# Patient Record
Sex: Male | Born: 1950 | Race: White | Hispanic: No | Smoking: Never smoker
Health system: Southern US, Community
[De-identification: ages and names within clinical notes are randomized; demographics above are authoritative.]

## PROBLEM LIST (undated history)

## (undated) DIAGNOSIS — R911 Solitary pulmonary nodule: Secondary | ICD-10-CM

## (undated) DIAGNOSIS — T1491XA Suicide attempt, initial encounter: Secondary | ICD-10-CM

## (undated) DIAGNOSIS — K219 Gastro-esophageal reflux disease without esophagitis: Secondary | ICD-10-CM

## (undated) DIAGNOSIS — K760 Fatty (change of) liver, not elsewhere classified: Secondary | ICD-10-CM

## (undated) DIAGNOSIS — I10 Essential (primary) hypertension: Secondary | ICD-10-CM

## (undated) DIAGNOSIS — F329 Major depressive disorder, single episode, unspecified: Secondary | ICD-10-CM

## (undated) DIAGNOSIS — N529 Male erectile dysfunction, unspecified: Secondary | ICD-10-CM

## (undated) DIAGNOSIS — J45909 Unspecified asthma, uncomplicated: Secondary | ICD-10-CM

## (undated) DIAGNOSIS — I499 Cardiac arrhythmia, unspecified: Secondary | ICD-10-CM

## (undated) DIAGNOSIS — G2581 Restless legs syndrome: Secondary | ICD-10-CM

## (undated) DIAGNOSIS — F32A Depression, unspecified: Secondary | ICD-10-CM

## (undated) DIAGNOSIS — F603 Borderline personality disorder: Secondary | ICD-10-CM

## (undated) DIAGNOSIS — E039 Hypothyroidism, unspecified: Secondary | ICD-10-CM

## (undated) DIAGNOSIS — G4730 Sleep apnea, unspecified: Secondary | ICD-10-CM

## (undated) DIAGNOSIS — F419 Anxiety disorder, unspecified: Secondary | ICD-10-CM

## (undated) HISTORY — PX: AMPUTATION ARM: SHX6593

## (undated) HISTORY — PX: COLONOSCOPY: SHX174

---

## 2004-06-23 ENCOUNTER — Emergency Department: Payer: Self-pay | Admitting: General Practice

## 2005-03-13 ENCOUNTER — Emergency Department: Payer: Self-pay | Admitting: Emergency Medicine

## 2005-03-13 ENCOUNTER — Other Ambulatory Visit: Payer: Self-pay

## 2005-06-04 ENCOUNTER — Inpatient Hospital Stay: Payer: Self-pay | Admitting: Psychiatry

## 2005-11-19 ENCOUNTER — Emergency Department: Payer: Self-pay | Admitting: Emergency Medicine

## 2006-02-22 ENCOUNTER — Ambulatory Visit: Payer: Self-pay | Admitting: Orthopedic Surgery

## 2006-06-17 ENCOUNTER — Other Ambulatory Visit: Payer: Self-pay

## 2006-06-17 ENCOUNTER — Ambulatory Visit: Payer: Self-pay | Admitting: Orthopedic Surgery

## 2006-11-22 ENCOUNTER — Emergency Department: Payer: Self-pay | Admitting: Emergency Medicine

## 2008-07-12 ENCOUNTER — Ambulatory Visit: Payer: Self-pay | Admitting: Gastroenterology

## 2008-11-26 ENCOUNTER — Emergency Department: Payer: Self-pay | Admitting: Internal Medicine

## 2009-02-19 ENCOUNTER — Emergency Department: Payer: Self-pay | Admitting: Emergency Medicine

## 2010-03-01 ENCOUNTER — Emergency Department: Payer: Self-pay | Admitting: Emergency Medicine

## 2010-04-11 ENCOUNTER — Inpatient Hospital Stay: Payer: Self-pay | Admitting: Psychiatry

## 2011-10-13 ENCOUNTER — Emergency Department: Payer: Self-pay | Admitting: Emergency Medicine

## 2011-10-22 ENCOUNTER — Inpatient Hospital Stay: Payer: Self-pay | Admitting: Psychiatry

## 2011-10-22 LAB — ETHANOL
Ethanol %: <0.003 % (ref 0.000–0.080)
Ethanol: <3 mg/dL

## 2011-10-22 LAB — DRUG SCREEN, URINE
Amphetamines, Ur Screen: NEGATIVE (ref ?–1000)
Benzodiazepine, Ur Scrn: NEGATIVE (ref ?–200)
Cannabinoid 50 Ng, Ur ~~LOC~~: NEGATIVE (ref ?–50)
Cocaine Metabolite,Ur ~~LOC~~: NEGATIVE (ref ?–300)
MDMA (Ecstasy)Ur Screen: POSITIVE (ref ?–500)
Methadone, Ur Screen: NEGATIVE (ref ?–300)
Opiate, Ur Screen: NEGATIVE (ref ?–300)
Tricyclic, Ur Screen: NEGATIVE (ref ?–1000)

## 2011-10-22 LAB — COMPREHENSIVE METABOLIC PANEL
Albumin: 3.8 g/dL (ref 3.4–5.0)
Anion Gap: 11 (ref 7–16)
Bilirubin,Total: 0.7 mg/dL (ref 0.2–1.0)
Chloride: 104 mmol/L (ref 98–107)
Co2: 26 mmol/L (ref 21–32)
Creatinine: 0.73 mg/dL (ref 0.60–1.30)
EGFR (African American): >60
EGFR (Non-African Amer.): >60
Osmolality: 280 (ref 275–301)
Potassium: 3.9 mmol/L (ref 3.5–5.1)
Sodium: 141 mmol/L (ref 136–145)

## 2011-10-22 LAB — URINALYSIS, COMPLETE
Bilirubin,UR: NEGATIVE
Blood: NEGATIVE
Ketone: NEGATIVE
Nitrite: NEGATIVE
Ph: 6 (ref 4.5–8.0)
Protein: NEGATIVE
Squamous Epithelial: 1
WBC UR: <1 /HPF (ref 0–5)

## 2011-10-22 LAB — CBC
HCT: 48.5 % (ref 40.0–52.0)
MCHC: 34.3 g/dL (ref 32.0–36.0)
MCV: 92 fL (ref 80–100)
RBC: 5.24 10*6/uL (ref 4.40–5.90)
RDW: 13.2 % (ref 11.5–14.5)
WBC: 4.5 10*3/uL (ref 3.8–10.6)

## 2011-10-22 LAB — TSH: Thyroid Stimulating Horm: 1.56 u[IU]/mL

## 2011-10-22 LAB — ACETAMINOPHEN LEVEL: Acetaminophen: <2 ug/mL

## 2012-02-12 ENCOUNTER — Emergency Department: Payer: Self-pay | Admitting: Internal Medicine

## 2012-02-12 LAB — CBC
HCT: 49.1 % (ref 40.0–52.0)
HGB: 16.5 g/dL (ref 13.0–18.0)
MCH: 31.3 pg (ref 26.0–34.0)
MCHC: 33.7 g/dL (ref 32.0–36.0)
MCV: 93 fL (ref 80–100)
RDW: 13.4 % (ref 11.5–14.5)

## 2012-02-12 LAB — URINALYSIS, COMPLETE
Bacteria: NONE SEEN
Bilirubin,UR: NEGATIVE
Blood: NEGATIVE
Ketone: NEGATIVE
Leukocyte Esterase: NEGATIVE
Ph: 5 (ref 4.5–8.0)
Protein: NEGATIVE
Specific Gravity: 1.019 (ref 1.003–1.030)
Squamous Epithelial: NONE SEEN
WBC UR: NONE SEEN /HPF (ref 0–5)

## 2012-02-12 LAB — COMPREHENSIVE METABOLIC PANEL
Albumin: 3.8 g/dL (ref 3.4–5.0)
BUN: 12 mg/dL (ref 7–18)
Bilirubin,Total: 0.9 mg/dL (ref 0.2–1.0)
Calcium, Total: 9.4 mg/dL (ref 8.5–10.1)
Chloride: 111 mmol/L — ABNORMAL HIGH (ref 98–107)
Co2: 25 mmol/L (ref 21–32)
Creatinine: 1.08 mg/dL (ref 0.60–1.30)
EGFR (African American): >60
EGFR (Non-African Amer.): >60
Glucose: 74 mg/dL (ref 65–99)
Potassium: 4.3 mmol/L (ref 3.5–5.1)
SGPT (ALT): 34 U/L
Total Protein: 7.9 g/dL (ref 6.4–8.2)

## 2012-02-12 LAB — CK TOTAL AND CKMB (NOT AT ARMC): CK, Total: 167 U/L (ref 35–232)

## 2012-02-12 LAB — DRUG SCREEN, URINE
Barbiturates, Ur Screen: NEGATIVE (ref ?–200)
Benzodiazepine, Ur Scrn: NEGATIVE (ref ?–200)
Cannabinoid 50 Ng, Ur ~~LOC~~: NEGATIVE (ref ?–50)
MDMA (Ecstasy)Ur Screen: POSITIVE (ref ?–500)
Opiate, Ur Screen: NEGATIVE (ref ?–300)
Phencyclidine (PCP) Ur S: NEGATIVE (ref ?–25)
Tricyclic, Ur Screen: NEGATIVE (ref ?–1000)

## 2012-02-12 LAB — PROTIME-INR: INR: 1

## 2012-02-12 LAB — TROPONIN I: Troponin-I: <0.02 ng/mL

## 2012-06-19 LAB — COMPREHENSIVE METABOLIC PANEL
BUN: 7 mg/dL (ref 7–18)
Calcium, Total: 8.6 mg/dL (ref 8.5–10.1)
Chloride: 103 mmol/L (ref 98–107)
Co2: 18 mmol/L — ABNORMAL LOW (ref 21–32)
EGFR (African American): >60
EGFR (Non-African Amer.): >60
SGOT(AST): 49 U/L — ABNORMAL HIGH (ref 15–37)
SGPT (ALT): 43 U/L (ref 12–78)

## 2012-06-19 LAB — TSH: Thyroid Stimulating Horm: 3.39 u[IU]/mL

## 2012-06-19 LAB — CBC
HCT: 53.4 % — ABNORMAL HIGH (ref 40.0–52.0)
HGB: 18.3 g/dL — ABNORMAL HIGH (ref 13.0–18.0)
MCH: 32 pg (ref 26.0–34.0)
MCHC: 34.3 g/dL (ref 32.0–36.0)
MCV: 93 fL (ref 80–100)
RBC: 5.74 10*6/uL (ref 4.40–5.90)

## 2012-06-19 LAB — SALICYLATE LEVEL: Salicylates, Serum: <1.7 mg/dL

## 2012-06-19 LAB — DRUG SCREEN, URINE
Barbiturates, Ur Screen: NEGATIVE (ref ?–200)
Benzodiazepine, Ur Scrn: NEGATIVE (ref ?–200)
Cocaine Metabolite,Ur ~~LOC~~: NEGATIVE (ref ?–300)
MDMA (Ecstasy)Ur Screen: NEGATIVE (ref ?–500)
Tricyclic, Ur Screen: NEGATIVE (ref ?–1000)

## 2012-06-19 LAB — ETHANOL: Ethanol: 118 mg/dL

## 2012-06-20 ENCOUNTER — Inpatient Hospital Stay: Payer: Self-pay | Admitting: Psychiatry

## 2012-07-11 ENCOUNTER — Inpatient Hospital Stay: Payer: Self-pay | Admitting: Psychiatry

## 2012-07-11 LAB — DRUG SCREEN, URINE
Amphetamines, Ur Screen: NEGATIVE (ref ?–1000)
Barbiturates, Ur Screen: NEGATIVE (ref ?–200)
Benzodiazepine, Ur Scrn: NEGATIVE (ref ?–200)
Cannabinoid 50 Ng, Ur ~~LOC~~: NEGATIVE (ref ?–50)
Cocaine Metabolite,Ur ~~LOC~~: NEGATIVE (ref ?–300)
MDMA (Ecstasy)Ur Screen: NEGATIVE (ref ?–500)
Methadone, Ur Screen: NEGATIVE (ref ?–300)
Tricyclic, Ur Screen: NEGATIVE (ref ?–1000)

## 2012-07-11 LAB — COMPREHENSIVE METABOLIC PANEL
Albumin: 4.1 g/dL (ref 3.4–5.0)
Alkaline Phosphatase: 55 U/L (ref 50–136)
Calcium, Total: 9.2 mg/dL (ref 8.5–10.1)
Chloride: 103 mmol/L (ref 98–107)
Co2: 27 mmol/L (ref 21–32)
EGFR (African American): >60
Potassium: 4.7 mmol/L (ref 3.5–5.1)
SGOT(AST): 39 U/L — ABNORMAL HIGH (ref 15–37)
SGPT (ALT): 46 U/L (ref 12–78)
Sodium: 140 mmol/L (ref 136–145)
Total Protein: 8.2 g/dL (ref 6.4–8.2)

## 2012-07-11 LAB — URINALYSIS, COMPLETE
Bacteria: NONE SEEN
Glucose,UR: NEGATIVE mg/dL (ref 0–75)
Leukocyte Esterase: NEGATIVE
Nitrite: NEGATIVE
Ph: 6 (ref 4.5–8.0)
RBC,UR: NONE SEEN /HPF (ref 0–5)
Squamous Epithelial: <1
WBC UR: NONE SEEN /HPF (ref 0–5)

## 2012-07-11 LAB — CBC
HGB: 18.4 g/dL — ABNORMAL HIGH (ref 13.0–18.0)
MCHC: 35.2 g/dL (ref 32.0–36.0)
RBC: 5.58 10*6/uL (ref 4.40–5.90)
RDW: 13.8 % (ref 11.5–14.5)

## 2012-07-11 LAB — ETHANOL: Ethanol: 152 mg/dL

## 2012-07-11 LAB — ACETAMINOPHEN LEVEL: Acetaminophen: <2 ug/mL

## 2012-07-11 LAB — SALICYLATE LEVEL: Salicylates, Serum: <1.7 mg/dL

## 2012-09-07 ENCOUNTER — Inpatient Hospital Stay: Payer: Self-pay | Admitting: Psychiatry

## 2012-09-07 LAB — COMPREHENSIVE METABOLIC PANEL
Alkaline Phosphatase: 67 U/L (ref 50–136)
Anion Gap: 9 (ref 7–16)
BUN: 8 mg/dL (ref 7–18)
Bilirubin,Total: 0.8 mg/dL (ref 0.2–1.0)
Chloride: 105 mmol/L (ref 98–107)
Co2: 24 mmol/L (ref 21–32)
Creatinine: 0.67 mg/dL (ref 0.60–1.30)
EGFR (African American): >60
EGFR (Non-African Amer.): >60
Glucose: 63 mg/dL — ABNORMAL LOW (ref 65–99)
Osmolality: 272 (ref 275–301)
Sodium: 138 mmol/L (ref 136–145)
Total Protein: 8.2 g/dL (ref 6.4–8.2)

## 2012-09-07 LAB — DRUG SCREEN, URINE
Amphetamines, Ur Screen: NEGATIVE (ref ?–1000)
Barbiturates, Ur Screen: NEGATIVE (ref ?–200)
Benzodiazepine, Ur Scrn: NEGATIVE (ref ?–200)
Methadone, Ur Screen: NEGATIVE (ref ?–300)
Phencyclidine (PCP) Ur S: NEGATIVE (ref ?–25)

## 2012-09-07 LAB — URINALYSIS, COMPLETE
Blood: NEGATIVE
Glucose,UR: 50 mg/dL (ref 0–75)
Leukocyte Esterase: NEGATIVE
Nitrite: NEGATIVE
Ph: 6 (ref 4.5–8.0)
Protein: NEGATIVE
RBC,UR: NONE SEEN /HPF (ref 0–5)
Squamous Epithelial: NONE SEEN

## 2012-09-07 LAB — CBC
HCT: 52.9 % — ABNORMAL HIGH (ref 40.0–52.0)
HGB: 18.2 g/dL — ABNORMAL HIGH (ref 13.0–18.0)
MCH: 31.8 pg (ref 26.0–34.0)
Platelet: 226 10*3/uL (ref 150–440)
RBC: 5.72 10*6/uL (ref 4.40–5.90)
WBC: 4.9 10*3/uL (ref 3.8–10.6)

## 2012-09-07 LAB — ACETAMINOPHEN LEVEL: Acetaminophen: <2 ug/mL

## 2012-09-07 LAB — ETHANOL: Ethanol: 144 mg/dL

## 2012-09-08 LAB — BEHAVIORAL MEDICINE 1 PANEL
Albumin: 3.9 g/dL (ref 3.4–5.0)
Alkaline Phosphatase: 63 U/L (ref 50–136)
Anion Gap: 10 (ref 7–16)
BUN: 12 mg/dL (ref 7–18)
Basophil #: 0.1 10*3/uL (ref 0.0–0.1)
Basophil %: 1 %
Bilirubin,Total: 1.8 mg/dL — ABNORMAL HIGH (ref 0.2–1.0)
Calcium, Total: 8.9 mg/dL (ref 8.5–10.1)
Chloride: 102 mmol/L (ref 98–107)
Co2: 23 mmol/L (ref 21–32)
Creatinine: 0.66 mg/dL (ref 0.60–1.30)
Eosinophil #: 0.1 10*3/uL (ref 0.0–0.7)
Glucose: 81 mg/dL (ref 65–99)
HCT: 52.8 % — ABNORMAL HIGH (ref 40.0–52.0)
Lymphocyte #: 1.7 10*3/uL (ref 1.0–3.6)
Lymphocyte %: 24.4 %
MCHC: 33.7 g/dL (ref 32.0–36.0)
MCV: 92 fL (ref 80–100)
Neutrophil #: 4.4 10*3/uL (ref 1.4–6.5)
RDW: 13.4 % (ref 11.5–14.5)
SGOT(AST): 29 U/L (ref 15–37)
SGPT (ALT): 47 U/L (ref 12–78)
Sodium: 135 mmol/L — ABNORMAL LOW (ref 136–145)
Thyroid Stimulating Horm: 4.57 u[IU]/mL — ABNORMAL HIGH
Total Protein: 7.6 g/dL (ref 6.4–8.2)

## 2012-09-29 ENCOUNTER — Emergency Department: Payer: Self-pay | Admitting: Emergency Medicine

## 2012-09-29 LAB — URINALYSIS, COMPLETE
Bacteria: NONE SEEN
Glucose,UR: NEGATIVE mg/dL (ref 0–75)
Ketone: NEGATIVE
Leukocyte Esterase: NEGATIVE
Nitrite: NEGATIVE
Protein: NEGATIVE
RBC,UR: NONE SEEN /HPF (ref 0–5)
Squamous Epithelial: <1
WBC UR: NONE SEEN /HPF (ref 0–5)

## 2012-09-29 LAB — COMPREHENSIVE METABOLIC PANEL
Alkaline Phosphatase: 69 U/L (ref 50–136)
BUN: 9 mg/dL (ref 7–18)
Bilirubin,Total: 0.7 mg/dL (ref 0.2–1.0)
Calcium, Total: 9.1 mg/dL (ref 8.5–10.1)
Co2: 25 mmol/L (ref 21–32)
Creatinine: 0.58 mg/dL — ABNORMAL LOW (ref 0.60–1.30)
EGFR (African American): >60
EGFR (Non-African Amer.): >60
Glucose: 61 mg/dL — ABNORMAL LOW (ref 65–99)
Osmolality: 274 (ref 275–301)
Sodium: 139 mmol/L (ref 136–145)

## 2012-09-29 LAB — CBC
HCT: 54.1 % — ABNORMAL HIGH (ref 40.0–52.0)
MCH: 31.9 pg (ref 26.0–34.0)
RBC: 5.86 10*6/uL (ref 4.40–5.90)

## 2012-09-29 LAB — DRUG SCREEN, URINE
Amphetamines, Ur Screen: NEGATIVE (ref ?–1000)
Barbiturates, Ur Screen: NEGATIVE (ref ?–200)
Benzodiazepine, Ur Scrn: NEGATIVE (ref ?–200)
Cannabinoid 50 Ng, Ur ~~LOC~~: NEGATIVE (ref ?–50)
Cocaine Metabolite,Ur ~~LOC~~: NEGATIVE (ref ?–300)
Opiate, Ur Screen: NEGATIVE (ref ?–300)
Phencyclidine (PCP) Ur S: NEGATIVE (ref ?–25)
Tricyclic, Ur Screen: NEGATIVE (ref ?–1000)

## 2012-09-29 LAB — SALICYLATE LEVEL: Salicylates, Serum: <1.7 mg/dL

## 2012-09-29 LAB — ETHANOL
Ethanol %: 0.174 % — ABNORMAL HIGH (ref 0.000–0.080)
Ethanol: 174 mg/dL

## 2012-09-29 LAB — ACETAMINOPHEN LEVEL: Acetaminophen: <2 ug/mL

## 2012-10-07 ENCOUNTER — Ambulatory Visit: Payer: Self-pay | Admitting: Family Medicine

## 2012-10-30 LAB — ETHANOL
Ethanol %: <0.003 % (ref 0.000–0.080)
Ethanol: <3 mg/dL

## 2012-10-30 LAB — COMPREHENSIVE METABOLIC PANEL
Albumin: 4 g/dL (ref 3.4–5.0)
Anion Gap: 8 (ref 7–16)
Bilirubin,Total: 0.8 mg/dL (ref 0.2–1.0)
Co2: 25 mmol/L (ref 21–32)
Creatinine: 0.83 mg/dL (ref 0.60–1.30)
EGFR (African American): >60
EGFR (Non-African Amer.): >60
Glucose: 60 mg/dL — ABNORMAL LOW (ref 65–99)
Osmolality: 272 (ref 275–301)
Potassium: 4.3 mmol/L (ref 3.5–5.1)
SGPT (ALT): 45 U/L (ref 12–78)

## 2012-10-30 LAB — URINALYSIS, COMPLETE
Bacteria: NONE SEEN
Bilirubin,UR: NEGATIVE
Blood: NEGATIVE
Ketone: NEGATIVE
Nitrite: NEGATIVE
Protein: NEGATIVE
RBC,UR: NONE SEEN /HPF (ref 0–5)
WBC UR: <1 /HPF (ref 0–5)

## 2012-10-30 LAB — CBC
HCT: 50.9 % (ref 40.0–52.0)
MCH: 31.4 pg (ref 26.0–34.0)
MCV: 92 fL (ref 80–100)
Platelet: 248 10*3/uL (ref 150–440)
RBC: 5.56 10*6/uL (ref 4.40–5.90)
RDW: 14 % (ref 11.5–14.5)
WBC: 5.3 10*3/uL (ref 3.8–10.6)

## 2012-10-30 LAB — DRUG SCREEN, URINE
Amphetamines, Ur Screen: NEGATIVE (ref ?–1000)
Barbiturates, Ur Screen: NEGATIVE (ref ?–200)
Cocaine Metabolite,Ur ~~LOC~~: NEGATIVE (ref ?–300)
MDMA (Ecstasy)Ur Screen: NEGATIVE (ref ?–500)
Methadone, Ur Screen: NEGATIVE (ref ?–300)
Opiate, Ur Screen: NEGATIVE (ref ?–300)
Tricyclic, Ur Screen: NEGATIVE (ref ?–1000)

## 2012-10-30 LAB — TSH: Thyroid Stimulating Horm: 3.41 u[IU]/mL

## 2012-10-31 ENCOUNTER — Inpatient Hospital Stay: Payer: Self-pay | Admitting: Psychiatry

## 2012-12-25 LAB — COMPREHENSIVE METABOLIC PANEL
Albumin: 3.5 g/dL (ref 3.4–5.0)
Alkaline Phosphatase: 48 U/L — ABNORMAL LOW (ref 50–136)
Anion Gap: 9 (ref 7–16)
Chloride: 112 mmol/L — ABNORMAL HIGH (ref 98–107)
Creatinine: 0.77 mg/dL (ref 0.60–1.30)
EGFR (African American): >60
EGFR (Non-African Amer.): >60
Osmolality: 275 (ref 275–301)
SGOT(AST): 31 U/L (ref 15–37)
Total Protein: 7 g/dL (ref 6.4–8.2)

## 2012-12-25 LAB — DRUG SCREEN, URINE
Amphetamines, Ur Screen: NEGATIVE (ref ?–1000)
Barbiturates, Ur Screen: NEGATIVE (ref ?–200)
Cocaine Metabolite,Ur ~~LOC~~: NEGATIVE (ref ?–300)
MDMA (Ecstasy)Ur Screen: NEGATIVE (ref ?–500)
Methadone, Ur Screen: NEGATIVE (ref ?–300)
Phencyclidine (PCP) Ur S: NEGATIVE (ref ?–25)
Tricyclic, Ur Screen: NEGATIVE (ref ?–1000)

## 2012-12-25 LAB — TSH: Thyroid Stimulating Horm: 2.75 u[IU]/mL

## 2012-12-25 LAB — URINALYSIS, COMPLETE
Blood: NEGATIVE
Ketone: NEGATIVE
Nitrite: NEGATIVE
Protein: NEGATIVE
Squamous Epithelial: <1
WBC UR: NONE SEEN /HPF (ref 0–5)

## 2012-12-25 LAB — ETHANOL: Ethanol: 135 mg/dL

## 2012-12-25 LAB — CBC
MCHC: 34.7 g/dL (ref 32.0–36.0)
RBC: 4.94 10*6/uL (ref 4.40–5.90)
RDW: 14 % (ref 11.5–14.5)

## 2012-12-26 ENCOUNTER — Inpatient Hospital Stay: Payer: Self-pay | Admitting: Psychiatry

## 2012-12-30 LAB — COMPREHENSIVE METABOLIC PANEL
Alkaline Phosphatase: 56 U/L (ref 50–136)
Anion Gap: 5 — ABNORMAL LOW (ref 7–16)
BUN: 11 mg/dL (ref 7–18)
Bilirubin,Total: 0.9 mg/dL (ref 0.2–1.0)
Calcium, Total: 9.1 mg/dL (ref 8.5–10.1)
Chloride: 102 mmol/L (ref 98–107)
Co2: 26 mmol/L (ref 21–32)
EGFR (Non-African Amer.): >60
Glucose: 71 mg/dL (ref 65–99)
Potassium: 4.2 mmol/L (ref 3.5–5.1)
SGPT (ALT): 62 U/L (ref 12–78)
Sodium: 133 mmol/L — ABNORMAL LOW (ref 136–145)
Total Protein: 7.9 g/dL (ref 6.4–8.2)

## 2012-12-30 LAB — DRUG SCREEN, URINE
Amphetamines, Ur Screen: NEGATIVE (ref ?–1000)
Barbiturates, Ur Screen: NEGATIVE (ref ?–200)
Cannabinoid 50 Ng, Ur ~~LOC~~: NEGATIVE (ref ?–50)
Cocaine Metabolite,Ur ~~LOC~~: NEGATIVE (ref ?–300)
MDMA (Ecstasy)Ur Screen: NEGATIVE (ref ?–500)
Phencyclidine (PCP) Ur S: NEGATIVE (ref ?–25)
Tricyclic, Ur Screen: NEGATIVE (ref ?–1000)

## 2012-12-30 LAB — CBC
HCT: 50.5 % (ref 40.0–52.0)
HGB: 17.7 g/dL (ref 13.0–18.0)
MCH: 32.5 pg (ref 26.0–34.0)
MCHC: 35.1 g/dL (ref 32.0–36.0)
MCV: 93 fL (ref 80–100)
Platelet: 238 10*3/uL (ref 150–440)
RBC: 5.45 10*6/uL (ref 4.40–5.90)
WBC: 4.8 10*3/uL (ref 3.8–10.6)

## 2012-12-30 LAB — ACETAMINOPHEN LEVEL: Acetaminophen: <2 ug/mL

## 2012-12-30 LAB — TSH: Thyroid Stimulating Horm: 2.58 u[IU]/mL

## 2012-12-30 LAB — ETHANOL: Ethanol %: <0.003 % (ref 0.000–0.080)

## 2012-12-30 LAB — SALICYLATE LEVEL: Salicylates, Serum: <1.7 mg/dL

## 2012-12-31 ENCOUNTER — Inpatient Hospital Stay: Payer: Self-pay | Admitting: Psychiatry

## 2013-01-10 ENCOUNTER — Emergency Department: Payer: Self-pay | Admitting: Emergency Medicine

## 2013-01-10 LAB — URINALYSIS, COMPLETE
Glucose,UR: NEGATIVE mg/dL (ref 0–75)
Leukocyte Esterase: NEGATIVE
Ph: 6 (ref 4.5–8.0)
RBC,UR: 3 /HPF (ref 0–5)
Specific Gravity: 1.01 (ref 1.003–1.030)
WBC UR: 3 /HPF (ref 0–5)

## 2013-01-10 LAB — CBC
HCT: 50.4 % (ref 40.0–52.0)
HGB: 17.8 g/dL (ref 13.0–18.0)
MCH: 32.1 pg (ref 26.0–34.0)
Platelet: 243 10*3/uL (ref 150–440)
RBC: 5.53 10*6/uL (ref 4.40–5.90)
RDW: 13.5 % (ref 11.5–14.5)
WBC: 5.4 10*3/uL (ref 3.8–10.6)

## 2013-01-10 LAB — COMPREHENSIVE METABOLIC PANEL
Anion Gap: 5 — ABNORMAL LOW (ref 7–16)
Co2: 28 mmol/L (ref 21–32)
Creatinine: 0.72 mg/dL (ref 0.60–1.30)
EGFR (African American): >60
Glucose: 67 mg/dL (ref 65–99)
Potassium: 4.2 mmol/L (ref 3.5–5.1)
SGOT(AST): 36 U/L (ref 15–37)
SGPT (ALT): 70 U/L (ref 12–78)
Sodium: 132 mmol/L — ABNORMAL LOW (ref 136–145)
Total Protein: 8.1 g/dL (ref 6.4–8.2)

## 2013-01-10 LAB — TSH: Thyroid Stimulating Horm: 4.11 u[IU]/mL

## 2013-01-10 LAB — ETHANOL
Ethanol %: 0.122 % — ABNORMAL HIGH (ref 0.000–0.080)
Ethanol: 122 mg/dL

## 2013-01-10 LAB — DRUG SCREEN, URINE
Barbiturates, Ur Screen: NEGATIVE (ref ?–200)
Benzodiazepine, Ur Scrn: NEGATIVE (ref ?–200)
Cocaine Metabolite,Ur ~~LOC~~: NEGATIVE (ref ?–300)
Tricyclic, Ur Screen: NEGATIVE (ref ?–1000)

## 2013-02-03 ENCOUNTER — Emergency Department: Payer: Self-pay | Admitting: Emergency Medicine

## 2013-02-25 ENCOUNTER — Emergency Department: Payer: Self-pay | Admitting: Emergency Medicine

## 2013-02-25 LAB — COMPREHENSIVE METABOLIC PANEL
Albumin: 4.1 g/dL (ref 3.4–5.0)
Alkaline Phosphatase: 71 U/L (ref 50–136)
Anion Gap: 7 (ref 7–16)
Bilirubin,Total: 1.4 mg/dL — ABNORMAL HIGH (ref 0.2–1.0)
Calcium, Total: 9 mg/dL (ref 8.5–10.1)
Co2: 26 mmol/L (ref 21–32)
Glucose: 74 mg/dL (ref 65–99)
Osmolality: 263 (ref 275–301)
Potassium: 4.5 mmol/L (ref 3.5–5.1)
SGOT(AST): 37 U/L (ref 15–37)

## 2013-02-25 LAB — CBC
MCHC: 35.1 g/dL (ref 32.0–36.0)
Platelet: 226 10*3/uL (ref 150–440)
RBC: 5.62 10*6/uL (ref 4.40–5.90)
WBC: 6.4 10*3/uL (ref 3.8–10.6)

## 2013-02-25 LAB — ETHANOL
Ethanol %: 0.156 % — ABNORMAL HIGH (ref 0.000–0.080)
Ethanol: 156 mg/dL

## 2013-02-26 LAB — URINALYSIS, COMPLETE
Bacteria: NONE SEEN
Blood: NEGATIVE
Glucose,UR: NEGATIVE mg/dL (ref 0–75)
Ketone: NEGATIVE
Leukocyte Esterase: NEGATIVE
Nitrite: NEGATIVE
Ph: 5 (ref 4.5–8.0)
RBC,UR: NONE SEEN /HPF (ref 0–5)

## 2013-02-26 LAB — DRUG SCREEN, URINE
Amphetamines, Ur Screen: NEGATIVE (ref ?–1000)
Barbiturates, Ur Screen: NEGATIVE (ref ?–200)
Cocaine Metabolite,Ur ~~LOC~~: NEGATIVE (ref ?–300)
Methadone, Ur Screen: NEGATIVE (ref ?–300)
Opiate, Ur Screen: NEGATIVE (ref ?–300)
Phencyclidine (PCP) Ur S: NEGATIVE (ref ?–25)
Tricyclic, Ur Screen: NEGATIVE (ref ?–1000)

## 2013-03-10 LAB — CBC
HCT: 48.3 % (ref 40.0–52.0)
MCH: 31.8 pg (ref 26.0–34.0)
MCHC: 34.3 g/dL (ref 32.0–36.0)
MCV: 93 fL (ref 80–100)
Platelet: 229 10*3/uL (ref 150–440)
RBC: 5.21 10*6/uL (ref 4.40–5.90)

## 2013-03-11 ENCOUNTER — Inpatient Hospital Stay: Payer: Self-pay | Admitting: Psychiatry

## 2013-03-11 LAB — URINALYSIS, COMPLETE
Bacteria: NONE SEEN
Blood: NEGATIVE
Glucose,UR: NEGATIVE mg/dL (ref 0–75)
Ketone: NEGATIVE
Nitrite: NEGATIVE
Ph: 5 (ref 4.5–8.0)
Protein: NEGATIVE
RBC,UR: <1 /HPF (ref 0–5)
Specific Gravity: 1.006 (ref 1.003–1.030)
WBC UR: 1 /HPF (ref 0–5)

## 2013-03-11 LAB — DRUG SCREEN, URINE
Amphetamines, Ur Screen: NEGATIVE (ref ?–1000)
Barbiturates, Ur Screen: NEGATIVE (ref ?–200)
Benzodiazepine, Ur Scrn: NEGATIVE (ref ?–200)
Cocaine Metabolite,Ur ~~LOC~~: NEGATIVE (ref ?–300)
MDMA (Ecstasy)Ur Screen: NEGATIVE (ref ?–500)
Methadone, Ur Screen: NEGATIVE (ref ?–300)
Opiate, Ur Screen: NEGATIVE (ref ?–300)
Phencyclidine (PCP) Ur S: NEGATIVE (ref ?–25)
Tricyclic, Ur Screen: NEGATIVE (ref ?–1000)

## 2013-03-11 LAB — COMPREHENSIVE METABOLIC PANEL
Albumin: 3.8 g/dL (ref 3.4–5.0)
Anion Gap: 9 (ref 7–16)
BUN: 11 mg/dL (ref 7–18)
Bilirubin,Total: 0.8 mg/dL (ref 0.2–1.0)
Calcium, Total: 8.6 mg/dL (ref 8.5–10.1)
Chloride: 106 mmol/L (ref 98–107)
Co2: 20 mmol/L — ABNORMAL LOW (ref 21–32)
Osmolality: 268 (ref 275–301)
Potassium: 3.7 mmol/L (ref 3.5–5.1)
SGOT(AST): 45 U/L — ABNORMAL HIGH (ref 15–37)

## 2013-03-11 LAB — ETHANOL: Ethanol: 119 mg/dL

## 2013-03-29 ENCOUNTER — Emergency Department: Payer: Self-pay | Admitting: Emergency Medicine

## 2013-03-29 LAB — COMPREHENSIVE METABOLIC PANEL
Alkaline Phosphatase: 61 U/L (ref 50–136)
BUN: 9 mg/dL (ref 7–18)
Bilirubin,Total: 0.9 mg/dL (ref 0.2–1.0)
Calcium, Total: 8.9 mg/dL (ref 8.5–10.1)
Co2: 23 mmol/L (ref 21–32)
EGFR (African American): >60
EGFR (Non-African Amer.): >60
Glucose: 69 mg/dL (ref 65–99)
Osmolality: 271 (ref 275–301)
Potassium: 3.5 mmol/L (ref 3.5–5.1)
SGOT(AST): 34 U/L (ref 15–37)
SGPT (ALT): 57 U/L (ref 12–78)
Sodium: 137 mmol/L (ref 136–145)

## 2013-03-29 LAB — CBC
HCT: 49.4 % (ref 40.0–52.0)
HGB: 17.2 g/dL (ref 13.0–18.0)
MCH: 31.7 pg (ref 26.0–34.0)
MCHC: 34.8 g/dL (ref 32.0–36.0)
MCV: 91 fL (ref 80–100)
Platelet: 233 10*3/uL (ref 150–440)
RBC: 5.42 10*6/uL (ref 4.40–5.90)
RDW: 13.4 % (ref 11.5–14.5)

## 2013-03-29 LAB — ETHANOL: Ethanol: 90 mg/dL

## 2013-03-29 LAB — SALICYLATE LEVEL: Salicylates, Serum: <1.7 mg/dL

## 2013-03-29 LAB — ACETAMINOPHEN LEVEL: Acetaminophen: <2 ug/mL

## 2013-03-30 LAB — URINALYSIS, COMPLETE
Bilirubin,UR: NEGATIVE
Blood: NEGATIVE
Ketone: NEGATIVE
Leukocyte Esterase: NEGATIVE
Nitrite: NEGATIVE
Protein: NEGATIVE
RBC,UR: <1 /HPF (ref 0–5)
Specific Gravity: 1.017 (ref 1.003–1.030)
Squamous Epithelial: NONE SEEN

## 2013-03-30 LAB — DRUG SCREEN, URINE
Barbiturates, Ur Screen: NEGATIVE (ref ?–200)
Benzodiazepine, Ur Scrn: NEGATIVE (ref ?–200)
Cannabinoid 50 Ng, Ur ~~LOC~~: NEGATIVE (ref ?–50)
Cocaine Metabolite,Ur ~~LOC~~: NEGATIVE (ref ?–300)
Methadone, Ur Screen: NEGATIVE (ref ?–300)
Opiate, Ur Screen: NEGATIVE (ref ?–300)
Phencyclidine (PCP) Ur S: NEGATIVE (ref ?–25)
Tricyclic, Ur Screen: NEGATIVE (ref ?–1000)

## 2013-05-07 LAB — URINALYSIS, COMPLETE
Glucose,UR: NEGATIVE mg/dL (ref 0–75)
Hyaline Cast: 12
Leukocyte Esterase: NEGATIVE
Nitrite: NEGATIVE
Ph: 5 (ref 4.5–8.0)
Protein: NEGATIVE
RBC,UR: 1 /HPF (ref 0–5)
Specific Gravity: 1.005 (ref 1.003–1.030)
Squamous Epithelial: NONE SEEN
WBC UR: <1 /HPF (ref 0–5)

## 2013-05-07 LAB — DRUG SCREEN, URINE
Amphetamines, Ur Screen: NEGATIVE (ref ?–1000)
Barbiturates, Ur Screen: NEGATIVE (ref ?–200)
Benzodiazepine, Ur Scrn: NEGATIVE (ref ?–200)
MDMA (Ecstasy)Ur Screen: POSITIVE (ref ?–500)
Opiate, Ur Screen: NEGATIVE (ref ?–300)
Phencyclidine (PCP) Ur S: NEGATIVE (ref ?–25)
Tricyclic, Ur Screen: NEGATIVE (ref ?–1000)

## 2013-05-07 LAB — COMPREHENSIVE METABOLIC PANEL
Albumin: 3.7 g/dL (ref 3.4–5.0)
Alkaline Phosphatase: 66 U/L (ref 50–136)
BUN: 9 mg/dL (ref 7–18)
Bilirubin,Total: 1.5 mg/dL — ABNORMAL HIGH (ref 0.2–1.0)
Calcium, Total: 8.4 mg/dL — ABNORMAL LOW (ref 8.5–10.1)
Chloride: 101 mmol/L (ref 98–107)
Co2: 17 mmol/L — ABNORMAL LOW (ref 21–32)
Creatinine: 0.76 mg/dL (ref 0.60–1.30)
EGFR (Non-African Amer.): >60
Glucose: 100 mg/dL — ABNORMAL HIGH (ref 65–99)
Osmolality: 267 (ref 275–301)
Potassium: 3.3 mmol/L — ABNORMAL LOW (ref 3.5–5.1)
SGOT(AST): 33 U/L (ref 15–37)
SGPT (ALT): 49 U/L (ref 12–78)

## 2013-05-07 LAB — ACETAMINOPHEN LEVEL: Acetaminophen: <2 ug/mL

## 2013-05-07 LAB — CBC
HCT: 49.1 % (ref 40.0–52.0)
HGB: 17.2 g/dL (ref 13.0–18.0)
MCH: 32.2 pg (ref 26.0–34.0)
MCV: 92 fL (ref 80–100)
RBC: 5.35 10*6/uL (ref 4.40–5.90)
RDW: 13.5 % (ref 11.5–14.5)
WBC: 5.1 10*3/uL (ref 3.8–10.6)

## 2013-05-07 LAB — SALICYLATE LEVEL: Salicylates, Serum: <1.7 mg/dL

## 2013-05-08 ENCOUNTER — Inpatient Hospital Stay: Payer: Self-pay | Admitting: Psychiatry

## 2013-06-25 ENCOUNTER — Emergency Department: Payer: Self-pay | Admitting: Emergency Medicine

## 2013-06-25 LAB — COMPREHENSIVE METABOLIC PANEL
Alkaline Phosphatase: 56 U/L (ref 50–136)
BUN: 7 mg/dL (ref 7–18)
Bilirubin,Total: 0.6 mg/dL (ref 0.2–1.0)
Calcium, Total: 8.9 mg/dL (ref 8.5–10.1)
Chloride: 107 mmol/L (ref 98–107)
Co2: 22 mmol/L (ref 21–32)
EGFR (African American): >60
EGFR (Non-African Amer.): >60
Potassium: 3.7 mmol/L (ref 3.5–5.1)
Sodium: 140 mmol/L (ref 136–145)

## 2013-06-25 LAB — CBC
HCT: 48.5 % (ref 40.0–52.0)
HGB: 17 g/dL (ref 13.0–18.0)
MCH: 32 pg (ref 26.0–34.0)
MCHC: 35.1 g/dL (ref 32.0–36.0)
Platelet: 211 10*3/uL (ref 150–440)
RBC: 5.31 10*6/uL (ref 4.40–5.90)
RDW: 12.7 % (ref 11.5–14.5)
WBC: 4.4 10*3/uL (ref 3.8–10.6)

## 2013-06-25 LAB — CK TOTAL AND CKMB (NOT AT ARMC)
CK, Total: 162 U/L (ref 35–232)
CK-MB: 3.5 ng/mL (ref 0.5–3.6)

## 2013-06-25 LAB — TROPONIN I: Troponin-I: <0.02 ng/mL

## 2013-07-17 ENCOUNTER — Ambulatory Visit: Payer: Self-pay | Admitting: Neurology

## 2013-08-10 ENCOUNTER — Emergency Department: Payer: Self-pay | Admitting: Emergency Medicine

## 2013-08-10 LAB — CBC
HCT: 49.6 % (ref 40.0–52.0)
HGB: 17.1 g/dL (ref 13.0–18.0)
MCH: 31.5 pg (ref 26.0–34.0)
MCHC: 34.4 g/dL (ref 32.0–36.0)
MCV: 92 fL (ref 80–100)
Platelet: 298 10*3/uL (ref 150–440)
RBC: 5.42 10*6/uL (ref 4.40–5.90)
WBC: 7.6 10*3/uL (ref 3.8–10.6)

## 2013-08-10 LAB — URINALYSIS, COMPLETE
Blood: NEGATIVE
Ketone: NEGATIVE
Leukocyte Esterase: NEGATIVE
Nitrite: NEGATIVE
Protein: NEGATIVE
RBC,UR: <1 /HPF (ref 0–5)
Specific Gravity: 1.015 (ref 1.003–1.030)
WBC UR: <1 /HPF (ref 0–5)

## 2013-08-10 LAB — COMPREHENSIVE METABOLIC PANEL
Albumin: 3.4 g/dL (ref 3.4–5.0)
Alkaline Phosphatase: 75 U/L
Anion Gap: 8 (ref 7–16)
Co2: 25 mmol/L (ref 21–32)
Creatinine: 0.79 mg/dL (ref 0.60–1.30)
EGFR (African American): >60
EGFR (Non-African Amer.): >60
Osmolality: 271 (ref 275–301)
Potassium: 4 mmol/L (ref 3.5–5.1)
SGOT(AST): 17 U/L (ref 15–37)
SGPT (ALT): 43 U/L (ref 12–78)
Sodium: 136 mmol/L (ref 136–145)
Total Protein: 7.6 g/dL (ref 6.4–8.2)

## 2013-08-10 LAB — LIPASE, BLOOD: Lipase: 192 U/L (ref 73–393)

## 2013-08-13 ENCOUNTER — Emergency Department: Payer: Self-pay | Admitting: Emergency Medicine

## 2013-08-13 LAB — BASIC METABOLIC PANEL
Anion Gap: 6 — ABNORMAL LOW (ref 7–16)
BUN: 10 mg/dL (ref 7–18)
Calcium, Total: 9 mg/dL (ref 8.5–10.1)
Creatinine: 0.86 mg/dL (ref 0.60–1.30)
Glucose: 78 mg/dL (ref 65–99)
Osmolality: 268 (ref 275–301)
Potassium: 3.7 mmol/L (ref 3.5–5.1)

## 2013-08-13 LAB — CBC WITH DIFFERENTIAL/PLATELET
Basophil %: 1.1 %
Eosinophil #: 0.1 10*3/uL (ref 0.0–0.7)
HCT: 47.7 % (ref 40.0–52.0)
HGB: 16.5 g/dL (ref 13.0–18.0)
Lymphocyte #: 1.3 10*3/uL (ref 1.0–3.6)
Lymphocyte %: 20.5 %
MCH: 31.3 pg (ref 26.0–34.0)
MCHC: 34.6 g/dL (ref 32.0–36.0)
Neutrophil #: 4.3 10*3/uL (ref 1.4–6.5)
RDW: 13.2 % (ref 11.5–14.5)
WBC: 6.3 10*3/uL (ref 3.8–10.6)

## 2013-08-13 LAB — URINALYSIS, COMPLETE
Bacteria: NONE SEEN
Bilirubin,UR: NEGATIVE
Blood: NEGATIVE
Ketone: NEGATIVE
Leukocyte Esterase: NEGATIVE
Nitrite: NEGATIVE
Protein: NEGATIVE
RBC,UR: <1 /HPF (ref 0–5)
Squamous Epithelial: NONE SEEN

## 2013-08-13 LAB — HEPATIC FUNCTION PANEL A (ARMC)
Albumin: 3.3 g/dL — ABNORMAL LOW (ref 3.4–5.0)
Alkaline Phosphatase: 67 U/L
SGOT(AST): 29 U/L (ref 15–37)
SGPT (ALT): 44 U/L (ref 12–78)

## 2013-09-26 ENCOUNTER — Emergency Department: Payer: Self-pay | Admitting: Emergency Medicine

## 2013-09-26 LAB — DRUG SCREEN, URINE
Amphetamines, Ur Screen: NEGATIVE (ref ?–1000)
BARBITURATES, UR SCREEN: NEGATIVE (ref ?–200)
BENZODIAZEPINE, UR SCRN: NEGATIVE (ref ?–200)
Cannabinoid 50 Ng, Ur ~~LOC~~: NEGATIVE (ref ?–50)
Cocaine Metabolite,Ur ~~LOC~~: NEGATIVE (ref ?–300)
MDMA (ECSTASY) UR SCREEN: POSITIVE (ref ?–500)
Methadone, Ur Screen: NEGATIVE (ref ?–300)
Opiate, Ur Screen: NEGATIVE (ref ?–300)
PHENCYCLIDINE (PCP) UR S: NEGATIVE (ref ?–25)
TRICYCLIC, UR SCREEN: NEGATIVE (ref ?–1000)

## 2013-09-26 LAB — URINALYSIS, COMPLETE
BACTERIA: NONE SEEN
Bilirubin,UR: NEGATIVE
Glucose,UR: NEGATIVE mg/dL (ref 0–75)
Ketone: NEGATIVE
LEUKOCYTE ESTERASE: NEGATIVE
NITRITE: NEGATIVE
Ph: 6 (ref 4.5–8.0)
Protein: NEGATIVE
RBC, UR: NONE SEEN /HPF (ref 0–5)
SPECIFIC GRAVITY: 1.002 (ref 1.003–1.030)
SQUAMOUS EPITHELIAL: NONE SEEN
WBC UR: 2 /HPF (ref 0–5)

## 2013-09-26 LAB — ACETAMINOPHEN LEVEL: Acetaminophen: <2 ug/mL

## 2013-09-26 LAB — COMPREHENSIVE METABOLIC PANEL
ALBUMIN: 4.2 g/dL (ref 3.4–5.0)
ALT: 35 U/L (ref 12–78)
ANION GAP: 5 — AB (ref 7–16)
Alkaline Phosphatase: 55 U/L
BILIRUBIN TOTAL: 1 mg/dL (ref 0.2–1.0)
BUN: 8 mg/dL (ref 7–18)
CALCIUM: 9.5 mg/dL (ref 8.5–10.1)
Chloride: 100 mmol/L (ref 98–107)
Co2: 27 mmol/L (ref 21–32)
Creatinine: 0.67 mg/dL (ref 0.60–1.30)
EGFR (Non-African Amer.): >60
Glucose: 79 mg/dL (ref 65–99)
Osmolality: 262 (ref 275–301)
Potassium: 4.5 mmol/L (ref 3.5–5.1)
SGOT(AST): 33 U/L (ref 15–37)
Sodium: 132 mmol/L — ABNORMAL LOW (ref 136–145)
TOTAL PROTEIN: 8.6 g/dL — AB (ref 6.4–8.2)

## 2013-09-26 LAB — CBC
HCT: 50.6 % (ref 40.0–52.0)
HGB: 17.7 g/dL (ref 13.0–18.0)
MCH: 31.7 pg (ref 26.0–34.0)
MCHC: 35 g/dL (ref 32.0–36.0)
MCV: 91 fL (ref 80–100)
PLATELETS: 204 10*3/uL (ref 150–440)
RBC: 5.58 10*6/uL (ref 4.40–5.90)
RDW: 13.8 % (ref 11.5–14.5)
WBC: 6.1 10*3/uL (ref 3.8–10.6)

## 2013-09-26 LAB — ETHANOL
ETHANOL LVL: 107 mg/dL
Ethanol %: 0.107 % — ABNORMAL HIGH (ref 0.000–0.080)

## 2013-09-26 LAB — SALICYLATE LEVEL: Salicylates, Serum: 2.7 mg/dL

## 2013-10-17 ENCOUNTER — Emergency Department: Payer: Self-pay | Admitting: Emergency Medicine

## 2013-10-17 LAB — CBC
HCT: 51.7 % (ref 40.0–52.0)
HGB: 17.6 g/dL (ref 13.0–18.0)
MCH: 32 pg (ref 26.0–34.0)
MCHC: 34.1 g/dL (ref 32.0–36.0)
MCV: 94 fL (ref 80–100)
Platelet: 210 10*3/uL (ref 150–440)
RBC: 5.52 10*6/uL (ref 4.40–5.90)
RDW: 13.9 % (ref 11.5–14.5)
WBC: 4.6 10*3/uL (ref 3.8–10.6)

## 2013-10-17 LAB — DRUG SCREEN, URINE
Amphetamines, Ur Screen: NEGATIVE (ref ?–1000)
Barbiturates, Ur Screen: NEGATIVE (ref ?–200)
Benzodiazepine, Ur Scrn: NEGATIVE (ref ?–200)
Cannabinoid 50 Ng, Ur ~~LOC~~: NEGATIVE (ref ?–50)
Cocaine Metabolite,Ur ~~LOC~~: NEGATIVE (ref ?–300)
MDMA (Ecstasy)Ur Screen: POSITIVE (ref ?–500)
METHADONE, UR SCREEN: NEGATIVE (ref ?–300)
OPIATE, UR SCREEN: NEGATIVE (ref ?–300)
PHENCYCLIDINE (PCP) UR S: NEGATIVE (ref ?–25)
Tricyclic, Ur Screen: NEGATIVE (ref ?–1000)

## 2013-10-17 LAB — COMPREHENSIVE METABOLIC PANEL
ANION GAP: 11 (ref 7–16)
Albumin: 4.1 g/dL (ref 3.4–5.0)
Alkaline Phosphatase: 53 U/L
BILIRUBIN TOTAL: 1.1 mg/dL — AB (ref 0.2–1.0)
BUN: 9 mg/dL (ref 7–18)
Calcium, Total: 8.8 mg/dL (ref 8.5–10.1)
Chloride: 100 mmol/L (ref 98–107)
Co2: 20 mmol/L — ABNORMAL LOW (ref 21–32)
Creatinine: 0.65 mg/dL (ref 0.60–1.30)
EGFR (African American): >60
Glucose: 61 mg/dL — ABNORMAL LOW (ref 65–99)
Osmolality: 259 (ref 275–301)
Potassium: 4.5 mmol/L (ref 3.5–5.1)
SGOT(AST): 40 U/L — ABNORMAL HIGH (ref 15–37)
SGPT (ALT): 41 U/L (ref 12–78)
Sodium: 131 mmol/L — ABNORMAL LOW (ref 136–145)
TOTAL PROTEIN: 8.3 g/dL — AB (ref 6.4–8.2)

## 2013-10-17 LAB — ACETAMINOPHEN LEVEL

## 2013-10-17 LAB — T4, FREE: Free Thyroxine: 0.76 ng/dL (ref 0.76–1.46)

## 2013-10-17 LAB — SALICYLATE LEVEL: Salicylates, Serum: <1.7 mg/dL

## 2013-10-17 LAB — ETHANOL
ETHANOL %: 0.126 % — AB (ref 0.000–0.080)
ETHANOL LVL: 126 mg/dL

## 2013-10-17 LAB — TSH: Thyroid Stimulating Horm: 8.08 u[IU]/mL — ABNORMAL HIGH

## 2013-10-19 IMAGING — CT CT ABDOMEN WO/W CM
2 of 4 series · 14 of 32 positions shown, 19 images · non-contrast
Comparison: none

REASON FOR EXAM: Pulmonary Nodule Kidney Mass
COMMENTS:

PROCEDURE:     KCT - KCT ABDOMEN STANDARD W/WO  - October 07, 2012 [DATE]
RESULT:
TECHNIQUE: CT of the abdomen is performed utilizing triphasic technique with
85 ml of 8sovue-XQR iodinated intravenous contrast. No oral contrast was
utilized. Images are reconstructed at 3.0 mm slice thickness in the axial
plane.

[Series 4: with 3.0 i40f 3 · axial · 0.81mm/px · z∈[-527,-317]mm · 8 of 90 slices shown, 13 images]
[im 10/90  soft-tissue]
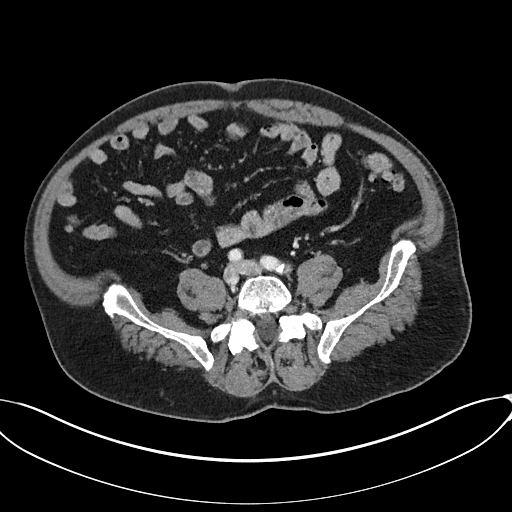
[im 10/90  bone]
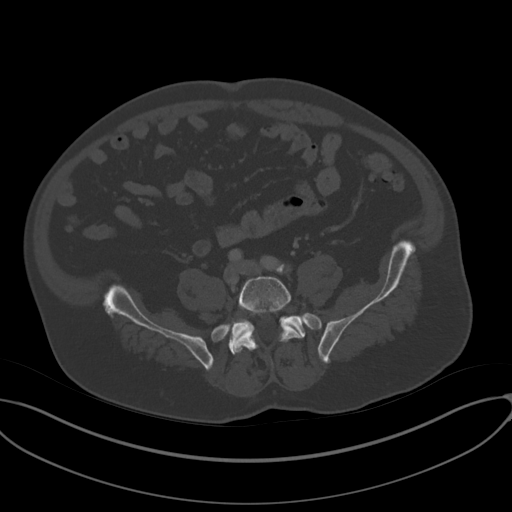
[im 20/90  soft-tissue]
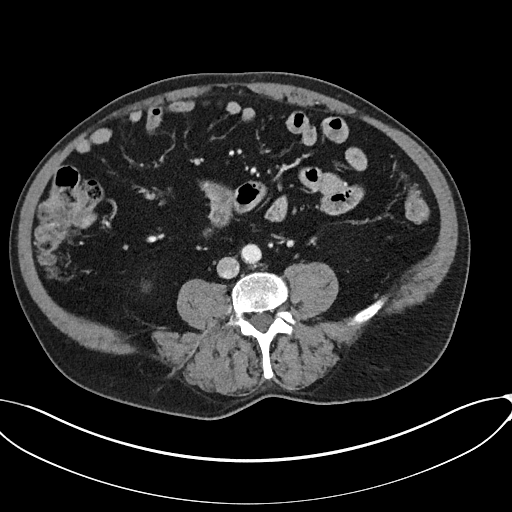
[im 30/90  soft-tissue]
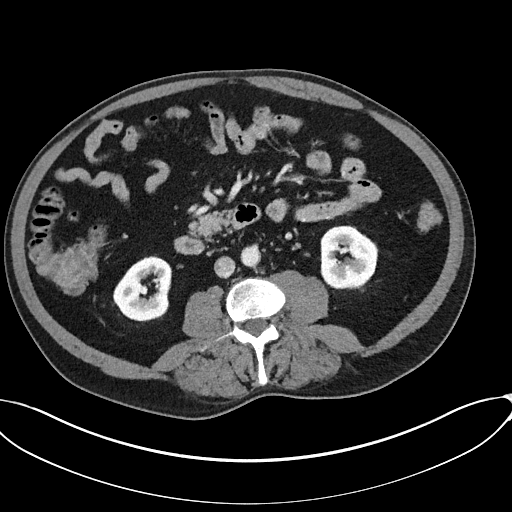
[im 40/90  soft-tissue]
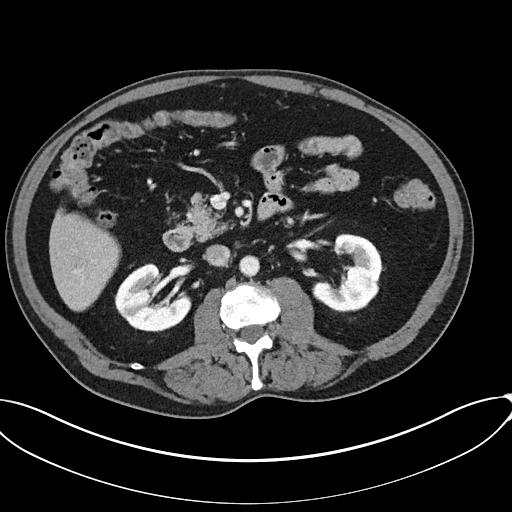
[im 50/90  soft-tissue]
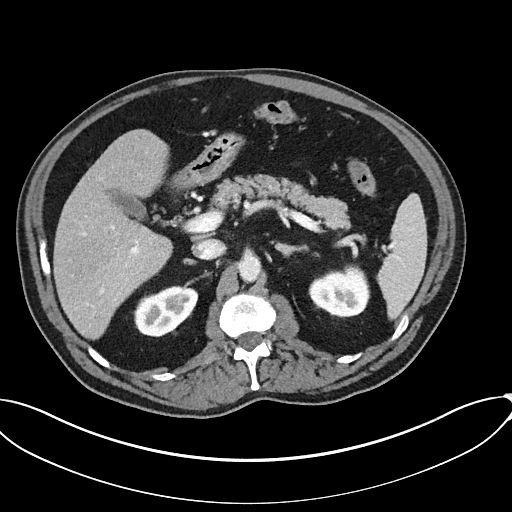
[im 50/90  lung]
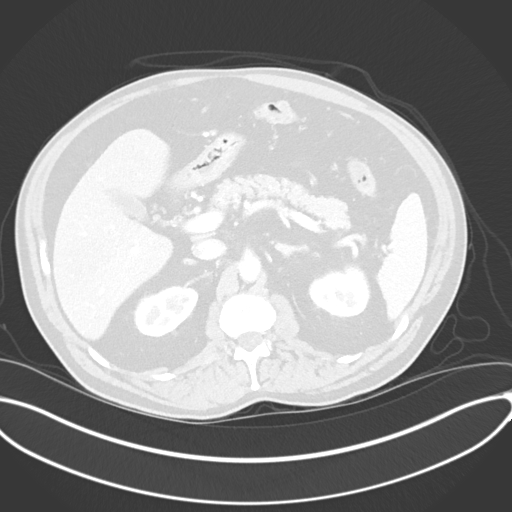
[im 60/90  soft-tissue]
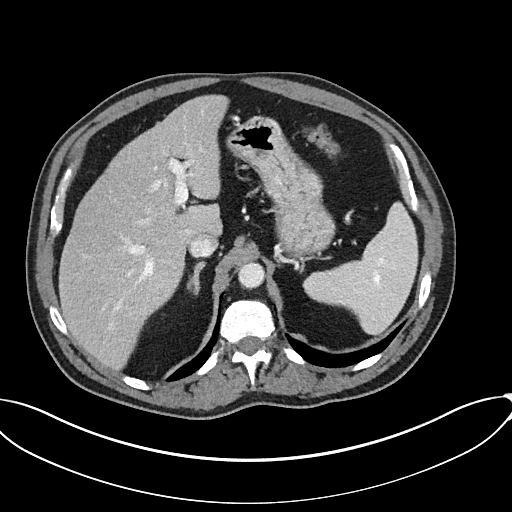
[im 60/90  lung]
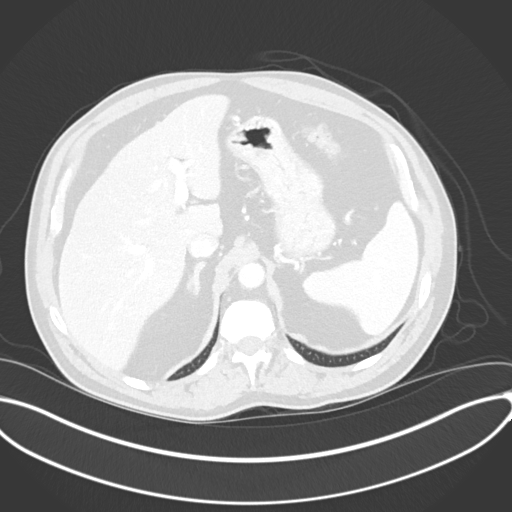
[im 70/90  soft-tissue]
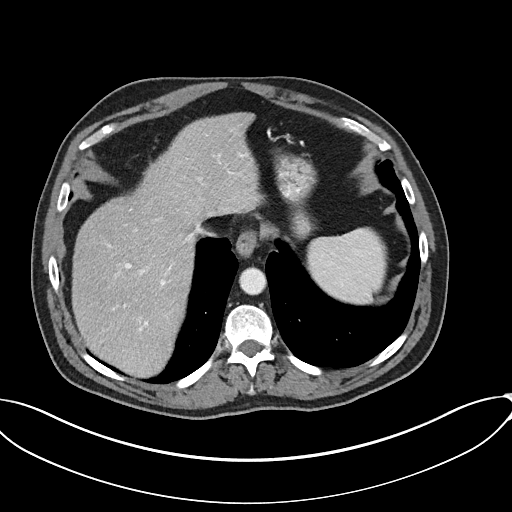
[im 70/90  lung]
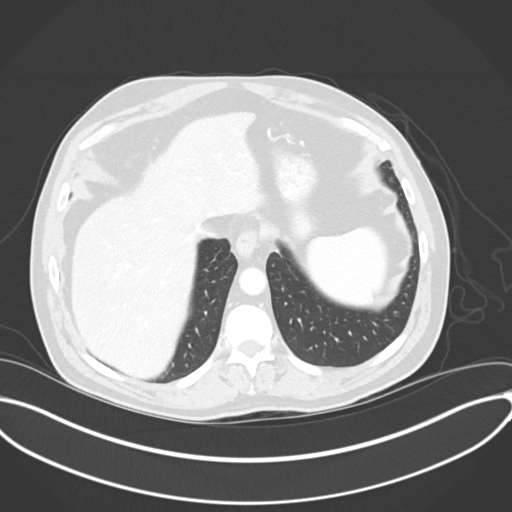
[im 80/90  soft-tissue]
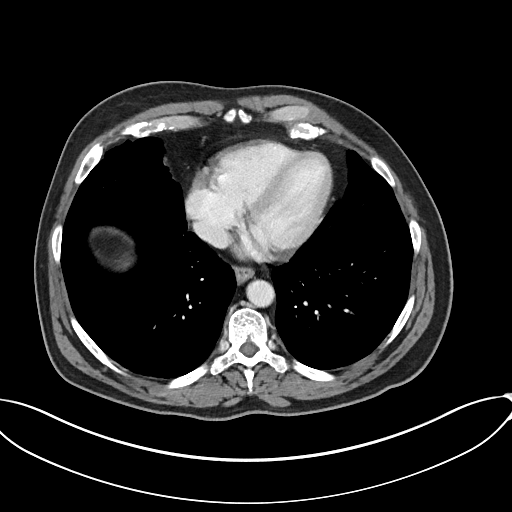
[im 80/90  lung]
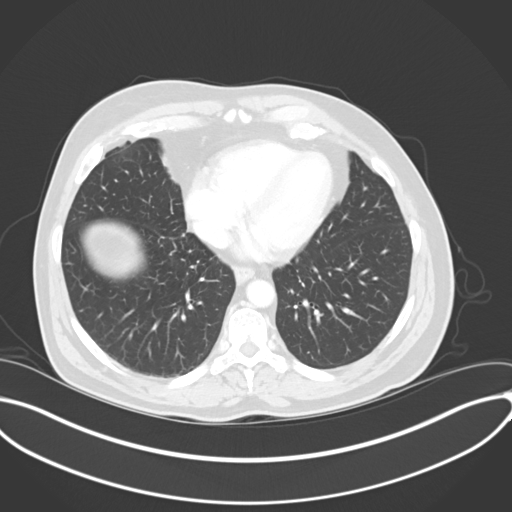

[Series 6: delay 3.0 i40f 3 · axial · delayed · 0.81mm/px · z∈[-527,-377]mm · 6 of 90 slices shown]
[im 10/90  soft-tissue]
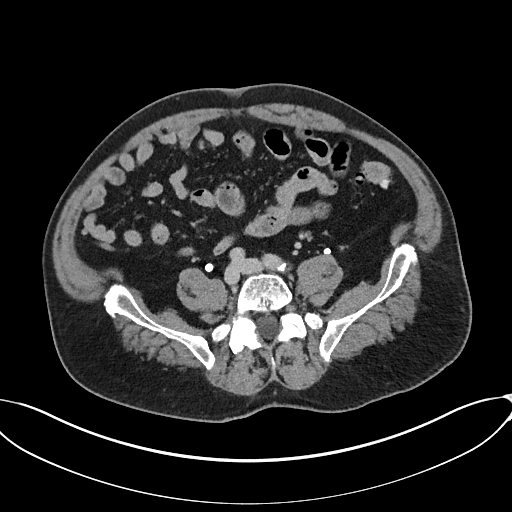
[im 20/90  soft-tissue]
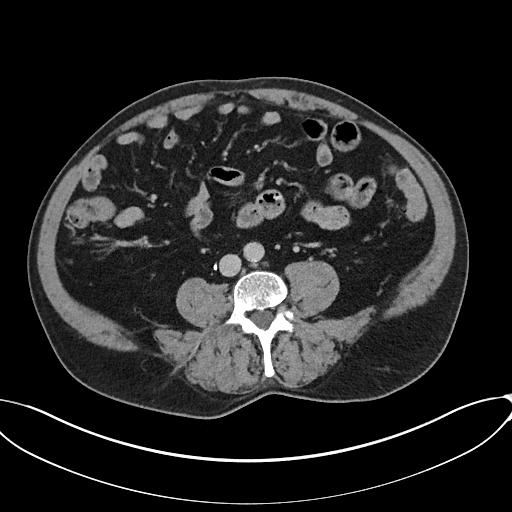
[im 30/90  soft-tissue]
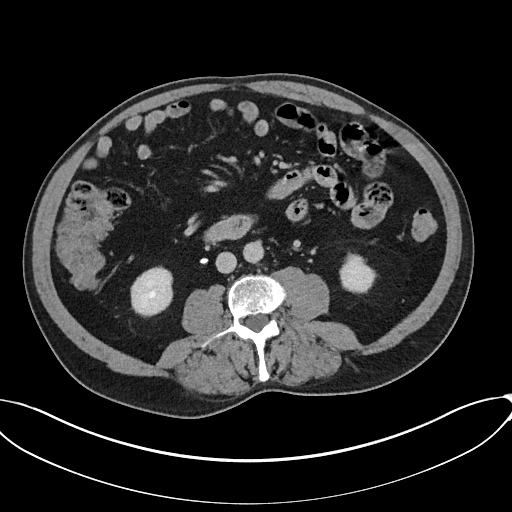
[im 40/90  soft-tissue]
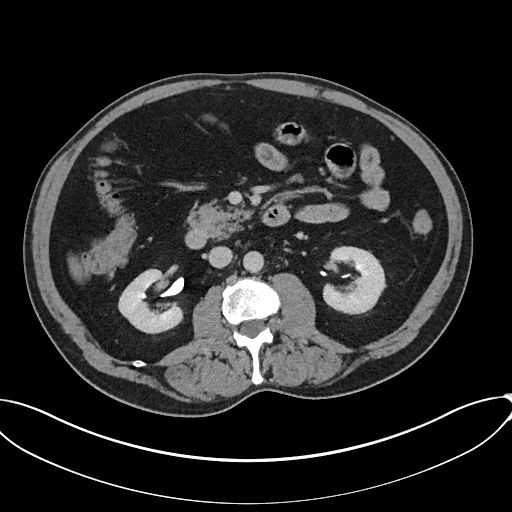
[im 50/90  soft-tissue]
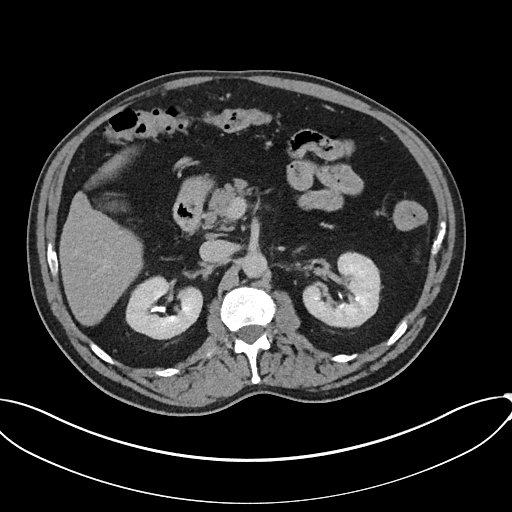
[im 60/90  soft-tissue]
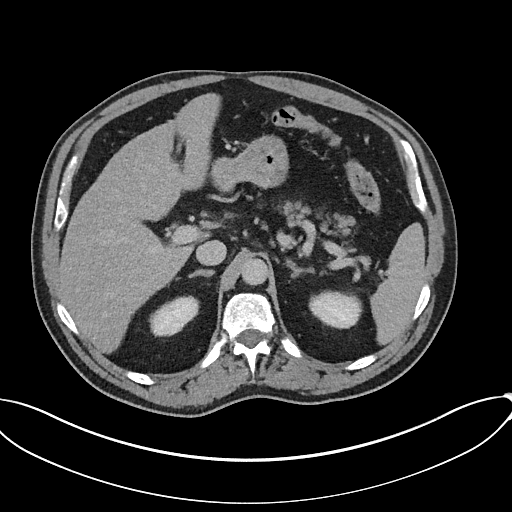

[14 of 32 positions shown; findings below may reference images not displayed]

FINDINGS: The lung bases appear clear. Noncontrast images show no
nephrolithiasis or hydronephrosis. No discrete mass is appreciated. Previous
ultrasound of the abdomen did not demonstrate a renal mass. Previous CT of
the abdomen and pelvis from 3559 does not reportedly show a renal mass. Both
kidneys excrete contrast opacified urine on the post contrast delayed
images. The liver, spleen, adrenal glands, pancreas, gallbladder and
abdominal aorta included on the study appear normal. There is no ascites or
abnormal fluid collection. There is no evidence of abnormal bowel distention
or adenopathy. The bony structures appear unremarkable.
IMPRESSION: 1.     No focal renal mass evident.
2.     No obstructive change demonstrated.

[REDACTED]

## 2013-10-19 IMAGING — CT CT CHEST W/O CM
1 of 2 series · 14 of 32 positions shown, 18 images · non-contrast
Comparison: none

REASON FOR EXAM: Pulmonary Nodule Kidney Mass
COMMENTS:

PROCEDURE:     KCT - KCT CHEST WITHOUT CONTRAST  - October 07, 2012 [DATE]
RESULT:
TECHNIQUE: Chest CT without contrast is reconstructed at 3 mm slice
thickness in the axial plane.
There is no previous exam for comparison.

[Series 2: chest w/o 3.0 i31f 2 · axial · non-contrast · 0.76mm/px · z∈[-386,-134]mm · 14 of 100 slices shown, 18 images]
[im 8/100  mediastinal]
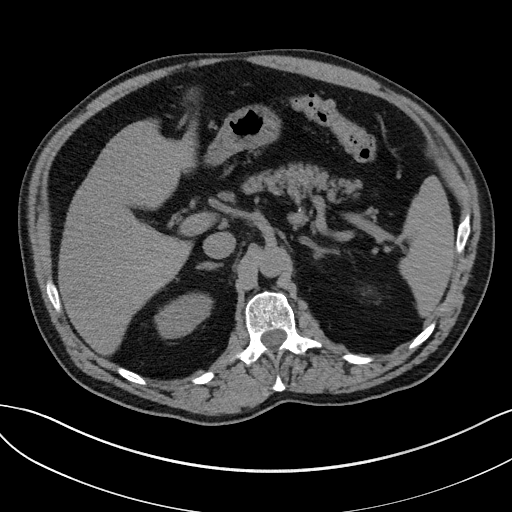
[im 8/100  lung]
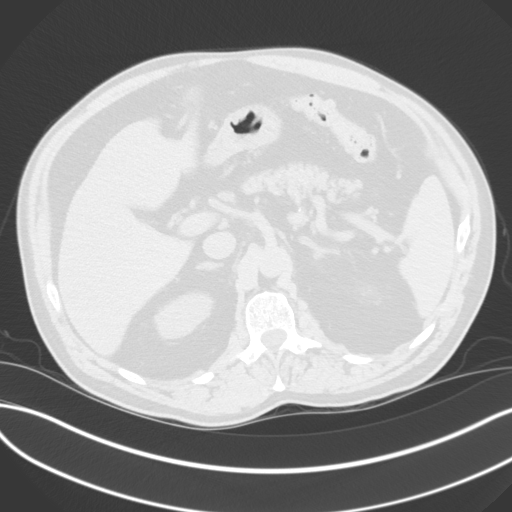
[im 16/100  lung]
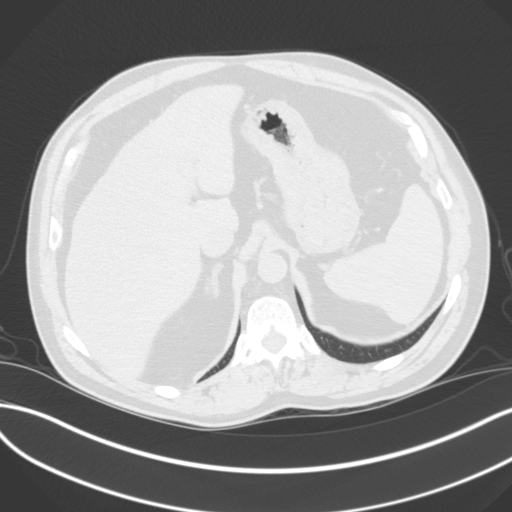
[im 23/100  lung]
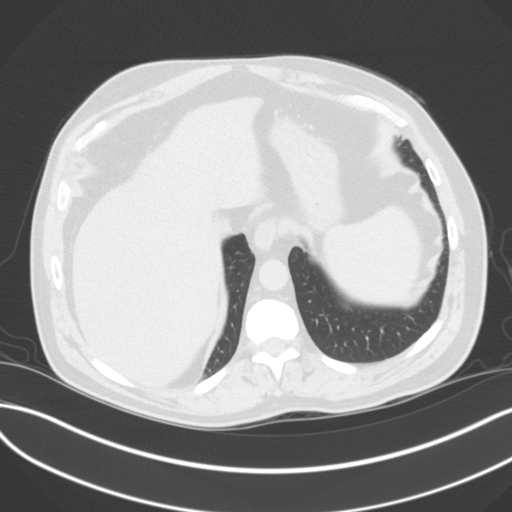
[im 31/100  lung]
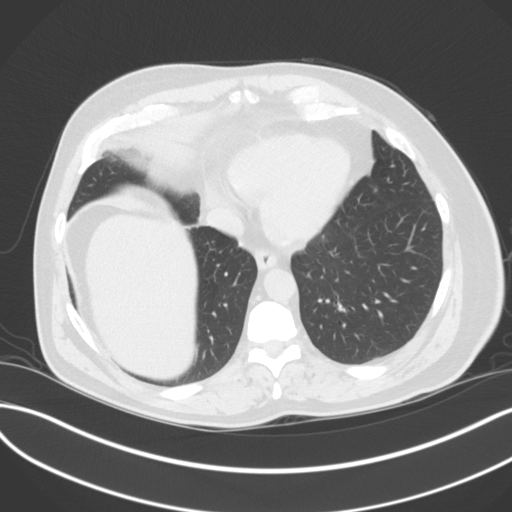
[im 39/100  mediastinal]
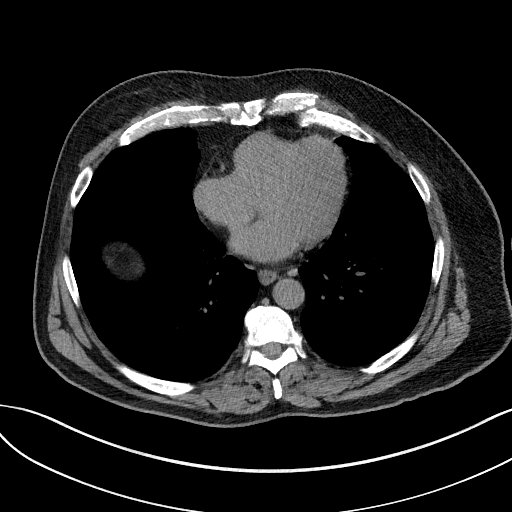
[im 39/100  lung]
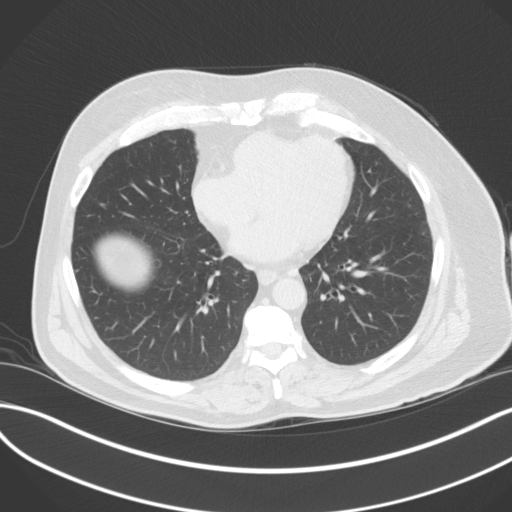
[im 46/100  lung]
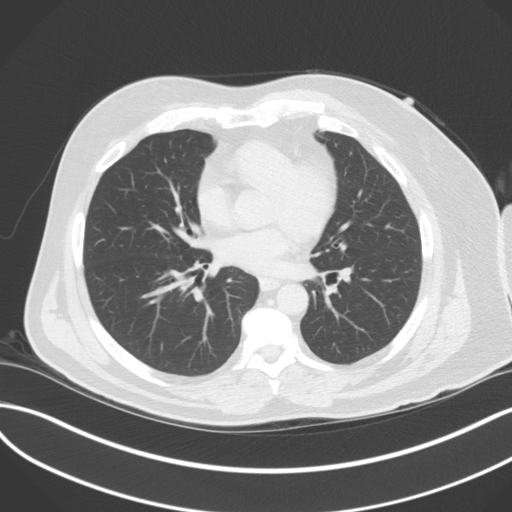
[im 48/100  lung]
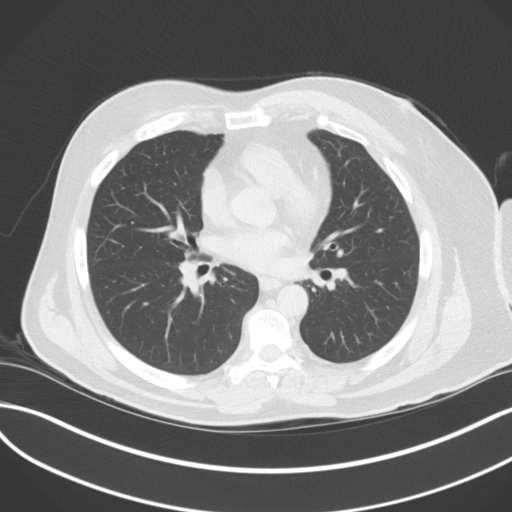
[im 50/100  lung]
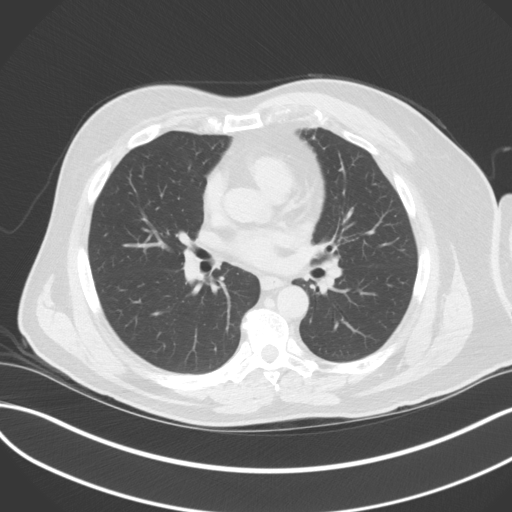
[im 54/100  mediastinal]
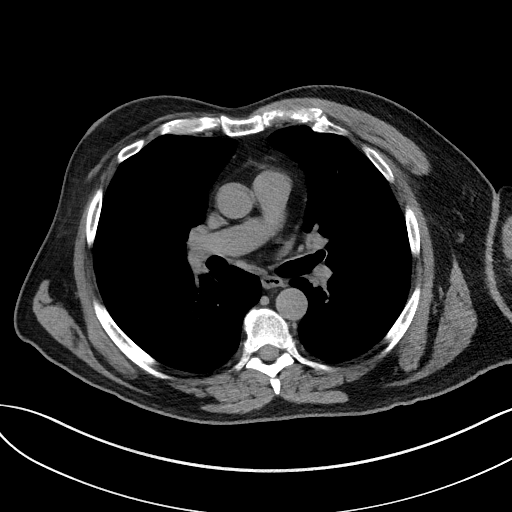
[im 54/100  lung]
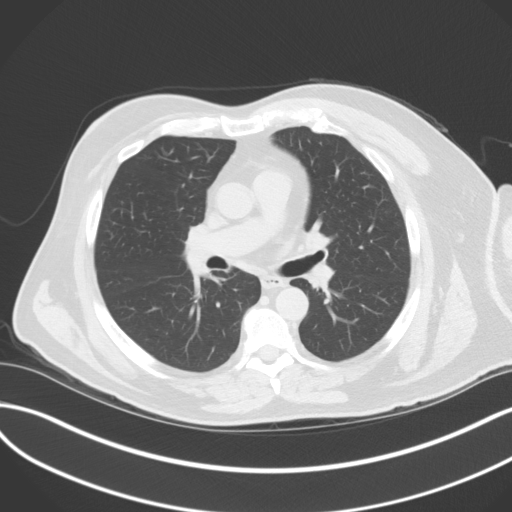
[im 61/100  lung]
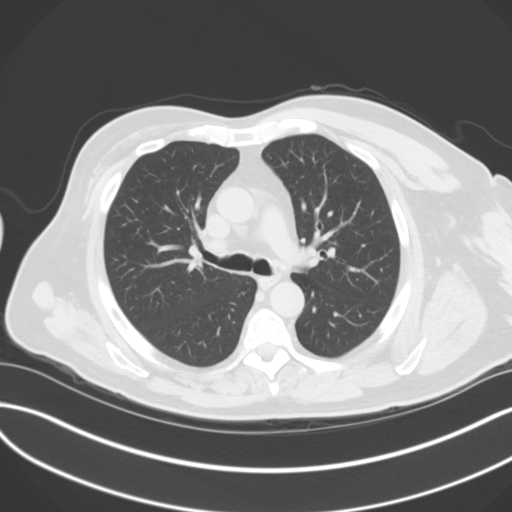
[im 69/100  lung]
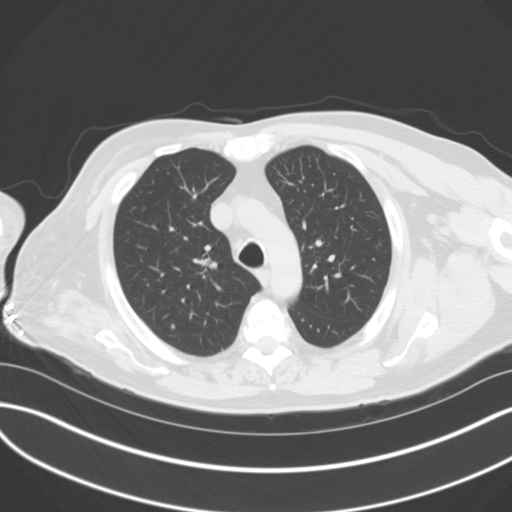
[im 77/100  lung]
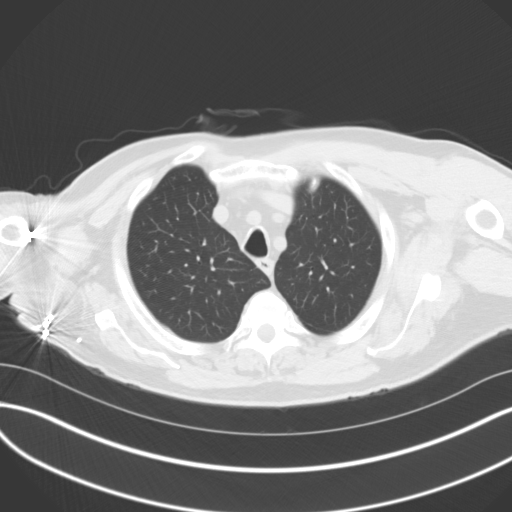
[im 84/100  mediastinal]
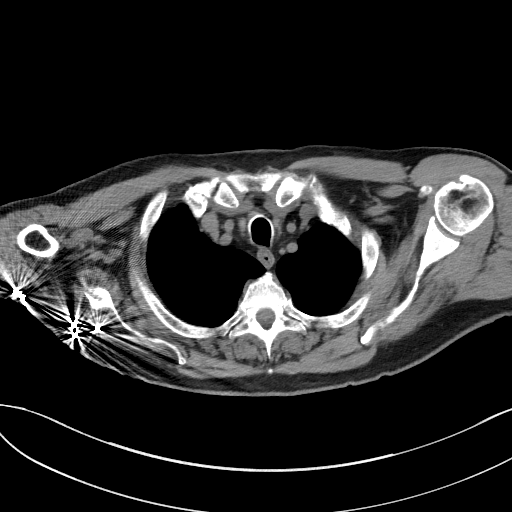
[im 84/100  lung]
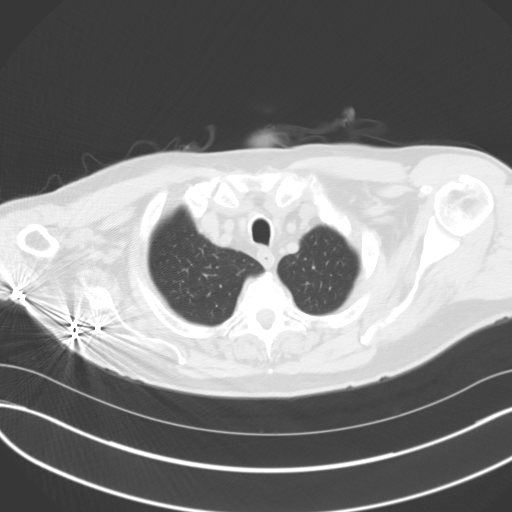
[im 92/100  lung]
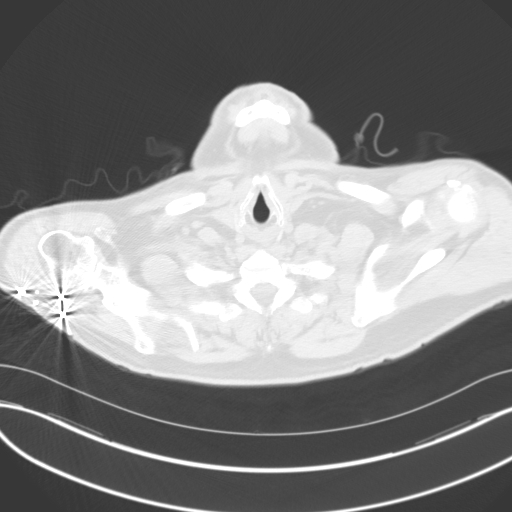

[14 of 32 positions shown; findings below may reference images not displayed]

FINDINGS: There is a smoothly marginated nodular density at the lung window
settings on image 31 in the superior segment of the right lower lobe
adjacent to the fissure measuring 3.7 mm. The lungs are otherwise clear.
There is no mediastinal or hilar mass or adenopathy. Nonenlarged pretracheal
lymph nodes are seen. The adrenal glands appear unremarkable. The esophagus
appears within normal limits. The aorta is normal in caliber. There is no
axillary mass or adenopathy demonstrated.
IMPRESSION: Minimal nodularity in the right lung. No acute abnormality.
The appearance is nonspecific. Follow-up in six months is recommended to
document stability.

[REDACTED]

## 2014-02-01 ENCOUNTER — Emergency Department: Payer: Self-pay | Admitting: Emergency Medicine

## 2014-02-28 ENCOUNTER — Inpatient Hospital Stay: Payer: Self-pay | Admitting: Psychiatry

## 2014-02-28 LAB — COMPREHENSIVE METABOLIC PANEL
ALT: 61 U/L (ref 12–78)
Albumin: 4 g/dL (ref 3.4–5.0)
Alkaline Phosphatase: 55 U/L
Anion Gap: 8 (ref 7–16)
BILIRUBIN TOTAL: 1.2 mg/dL — AB (ref 0.2–1.0)
BUN: 13 mg/dL (ref 7–18)
CALCIUM: 9.1 mg/dL (ref 8.5–10.1)
CHLORIDE: 100 mmol/L (ref 98–107)
CO2: 27 mmol/L (ref 21–32)
Creatinine: 0.92 mg/dL (ref 0.60–1.30)
EGFR (African American): >60
GLUCOSE: 71 mg/dL (ref 65–99)
Osmolality: 269 (ref 275–301)
Potassium: 4.5 mmol/L (ref 3.5–5.1)
SGOT(AST): 39 U/L — ABNORMAL HIGH (ref 15–37)
Sodium: 135 mmol/L — ABNORMAL LOW (ref 136–145)
Total Protein: 8.3 g/dL — ABNORMAL HIGH (ref 6.4–8.2)

## 2014-02-28 LAB — DRUG SCREEN, URINE
AMPHETAMINES, UR SCREEN: NEGATIVE (ref ?–1000)
Barbiturates, Ur Screen: POSITIVE (ref ?–200)
Benzodiazepine, Ur Scrn: NEGATIVE (ref ?–200)
Cannabinoid 50 Ng, Ur ~~LOC~~: NEGATIVE (ref ?–50)
Cocaine Metabolite,Ur ~~LOC~~: NEGATIVE (ref ?–300)
MDMA (ECSTASY) UR SCREEN: NEGATIVE (ref ?–500)
Methadone, Ur Screen: NEGATIVE (ref ?–300)
OPIATE, UR SCREEN: NEGATIVE (ref ?–300)
Phencyclidine (PCP) Ur S: NEGATIVE (ref ?–25)
Tricyclic, Ur Screen: NEGATIVE (ref ?–1000)

## 2014-02-28 LAB — URINALYSIS, COMPLETE
BACTERIA: NONE SEEN
Bilirubin,UR: NEGATIVE
Blood: NEGATIVE
GLUCOSE, UR: NEGATIVE mg/dL (ref 0–75)
KETONE: NEGATIVE
LEUKOCYTE ESTERASE: NEGATIVE
Nitrite: NEGATIVE
Ph: 7 (ref 4.5–8.0)
Protein: NEGATIVE
RBC,UR: 1 /HPF (ref 0–5)
Specific Gravity: 1.017 (ref 1.003–1.030)
Squamous Epithelial: NONE SEEN

## 2014-02-28 LAB — CBC
HCT: 52.2 % — ABNORMAL HIGH (ref 40.0–52.0)
HGB: 17.8 g/dL (ref 13.0–18.0)
MCH: 32.3 pg (ref 26.0–34.0)
MCHC: 34.1 g/dL (ref 32.0–36.0)
MCV: 95 fL (ref 80–100)
Platelet: 247 10*3/uL (ref 150–440)
RBC: 5.51 10*6/uL (ref 4.40–5.90)
RDW: 13.3 % (ref 11.5–14.5)
WBC: 5.1 10*3/uL (ref 3.8–10.6)

## 2014-02-28 LAB — ACETAMINOPHEN LEVEL

## 2014-02-28 LAB — ETHANOL: Ethanol: <3 mg/dL

## 2014-02-28 LAB — SALICYLATE LEVEL

## 2014-03-01 DIAGNOSIS — F320 Major depressive disorder, single episode, mild: Secondary | ICD-10-CM

## 2014-04-18 LAB — COMPREHENSIVE METABOLIC PANEL
Albumin: 3.9 g/dL (ref 3.4–5.0)
Alkaline Phosphatase: 48 U/L
Anion Gap: 7 (ref 7–16)
BUN: 13 mg/dL (ref 7–18)
Bilirubin,Total: 1.1 mg/dL — ABNORMAL HIGH (ref 0.2–1.0)
CHLORIDE: 107 mmol/L (ref 98–107)
CREATININE: 0.98 mg/dL (ref 0.60–1.30)
Calcium, Total: 9.2 mg/dL (ref 8.5–10.1)
Co2: 25 mmol/L (ref 21–32)
EGFR (African American): >60
Glucose: 78 mg/dL (ref 65–99)
OSMOLALITY: 277 (ref 275–301)
POTASSIUM: 4.2 mmol/L (ref 3.5–5.1)
SGOT(AST): 41 U/L — ABNORMAL HIGH (ref 15–37)
SGPT (ALT): 69 U/L — ABNORMAL HIGH
SODIUM: 139 mmol/L (ref 136–145)
Total Protein: 8.1 g/dL (ref 6.4–8.2)

## 2014-04-18 LAB — URINALYSIS, COMPLETE
Bacteria: NONE SEEN
Bilirubin,UR: NEGATIVE
Blood: NEGATIVE
Glucose,UR: NEGATIVE mg/dL (ref 0–75)
Ketone: NEGATIVE
Leukocyte Esterase: NEGATIVE
NITRITE: NEGATIVE
PH: 5 (ref 4.5–8.0)
PROTEIN: NEGATIVE
RBC,UR: NONE SEEN /HPF (ref 0–5)
SQUAMOUS EPITHELIAL: NONE SEEN
Specific Gravity: 1.018 (ref 1.003–1.030)
WBC UR: NONE SEEN /HPF (ref 0–5)

## 2014-04-18 LAB — DRUG SCREEN, URINE
AMPHETAMINES, UR SCREEN: NEGATIVE (ref ?–1000)
BARBITURATES, UR SCREEN: NEGATIVE (ref ?–200)
Benzodiazepine, Ur Scrn: NEGATIVE (ref ?–200)
Cannabinoid 50 Ng, Ur ~~LOC~~: NEGATIVE (ref ?–50)
Cocaine Metabolite,Ur ~~LOC~~: NEGATIVE (ref ?–300)
MDMA (ECSTASY) UR SCREEN: NEGATIVE (ref ?–500)
Methadone, Ur Screen: NEGATIVE (ref ?–300)
Opiate, Ur Screen: NEGATIVE (ref ?–300)
Phencyclidine (PCP) Ur S: NEGATIVE (ref ?–25)
TRICYCLIC, UR SCREEN: NEGATIVE (ref ?–1000)

## 2014-04-18 LAB — ETHANOL: Ethanol %: <0.003 % (ref 0.000–0.080)

## 2014-04-18 LAB — CBC
HCT: 54.5 % — ABNORMAL HIGH (ref 40.0–52.0)
HGB: 18.2 g/dL — AB (ref 13.0–18.0)
MCH: 31.4 pg (ref 26.0–34.0)
MCHC: 33.3 g/dL (ref 32.0–36.0)
MCV: 94 fL (ref 80–100)
Platelet: 250 10*3/uL (ref 150–440)
RBC: 5.78 10*6/uL (ref 4.40–5.90)
RDW: 13.2 % (ref 11.5–14.5)
WBC: 5.8 10*3/uL (ref 3.8–10.6)

## 2014-04-18 LAB — ACETAMINOPHEN LEVEL

## 2014-04-18 LAB — SALICYLATE LEVEL

## 2014-04-19 ENCOUNTER — Inpatient Hospital Stay: Payer: Self-pay | Admitting: Psychiatry

## 2014-04-22 LAB — HEMOGLOBIN A1C: Hemoglobin A1C: 5.2 % (ref 4.2–6.3)

## 2014-04-22 LAB — LIPID PANEL
Cholesterol: 152 mg/dL (ref 0–200)
HDL Cholesterol: 36 mg/dL — ABNORMAL LOW (ref 40–60)
Ldl Cholesterol, Calc: 86 mg/dL (ref 0–100)
TRIGLYCERIDES: 151 mg/dL (ref 0–200)
VLDL Cholesterol, Calc: 30 mg/dL (ref 5–40)

## 2014-07-31 ENCOUNTER — Inpatient Hospital Stay: Payer: Self-pay | Admitting: Psychiatry

## 2014-07-31 LAB — CBC
HCT: 53 % — ABNORMAL HIGH (ref 40.0–52.0)
HGB: 17.7 g/dL (ref 13.0–18.0)
MCH: 31.2 pg (ref 26.0–34.0)
MCHC: 33.3 g/dL (ref 32.0–36.0)
MCV: 94 fL (ref 80–100)
PLATELETS: 242 10*3/uL (ref 150–440)
RBC: 5.65 10*6/uL (ref 4.40–5.90)
RDW: 13.4 % (ref 11.5–14.5)
WBC: 4.9 10*3/uL (ref 3.8–10.6)

## 2014-07-31 LAB — URINALYSIS, COMPLETE
BACTERIA: NONE SEEN
BILIRUBIN, UR: NEGATIVE
Blood: NEGATIVE
GLUCOSE, UR: NEGATIVE mg/dL (ref 0–75)
Ketone: NEGATIVE
Leukocyte Esterase: NEGATIVE
NITRITE: NEGATIVE
Ph: 5 (ref 4.5–8.0)
Protein: NEGATIVE
RBC,UR: 1 /HPF (ref 0–5)
SPECIFIC GRAVITY: 1.017 (ref 1.003–1.030)
Squamous Epithelial: NONE SEEN

## 2014-07-31 LAB — COMPREHENSIVE METABOLIC PANEL
ALBUMIN: 4.2 g/dL (ref 3.4–5.0)
ALK PHOS: 52 U/L
ALT: 110 U/L — AB
AST: 51 U/L — AB (ref 15–37)
Anion Gap: 8 (ref 7–16)
BUN: 12 mg/dL (ref 7–18)
Bilirubin,Total: 1.4 mg/dL — ABNORMAL HIGH (ref 0.2–1.0)
CO2: 25 mmol/L (ref 21–32)
Calcium, Total: 9.3 mg/dL (ref 8.5–10.1)
Chloride: 102 mmol/L (ref 98–107)
Creatinine: 0.93 mg/dL (ref 0.60–1.30)
EGFR (African American): >60
EGFR (Non-African Amer.): >60
Glucose: 66 mg/dL (ref 65–99)
OSMOLALITY: 268 (ref 275–301)
Potassium: 4.1 mmol/L (ref 3.5–5.1)
Sodium: 135 mmol/L — ABNORMAL LOW (ref 136–145)
TOTAL PROTEIN: 8.2 g/dL (ref 6.4–8.2)

## 2014-07-31 LAB — DRUG SCREEN, URINE

## 2014-07-31 LAB — ETHANOL

## 2014-07-31 LAB — ACETAMINOPHEN LEVEL: Acetaminophen: <2 ug/mL

## 2014-07-31 LAB — SALICYLATE LEVEL

## 2014-11-30 ENCOUNTER — Emergency Department: Payer: Self-pay | Admitting: Emergency Medicine

## 2014-12-05 LAB — URINALYSIS, COMPLETE
BLOOD: NEGATIVE
Bacteria: NONE SEEN
Bilirubin,UR: NEGATIVE
Glucose,UR: >=500 mg/dL (ref 0–75)
Ketone: NEGATIVE
LEUKOCYTE ESTERASE: NEGATIVE
Nitrite: NEGATIVE
PH: 6 (ref 4.5–8.0)
Protein: NEGATIVE
RBC,UR: <1 /HPF (ref 0–5)
Specific Gravity: 1.012 (ref 1.003–1.030)
Squamous Epithelial: <1
WBC UR: <1 /HPF (ref 0–5)

## 2014-12-05 LAB — CBC
HCT: 50.3 %
HGB: 17.3 g/dL
MCH: 31.1 pg
MCHC: 34.3 g/dL
MCV: 91 fL
Platelet: 267 x10 3/mm 3
RBC: 5.55 x10 6/mm 3
RDW: 13.3 %
WBC: 6.6 x10 3/mm 3

## 2014-12-06 ENCOUNTER — Inpatient Hospital Stay: Payer: Self-pay | Admitting: Psychiatry

## 2014-12-06 LAB — ETHANOL
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
## 2015-01-01 NOTE — Consult Note (Signed)
Brief Consult Note: Diagnosis: Alcohol dependence.   Patient was seen by consultant.   Consult note dictated.   Recommend further assessment or treatment.   Orders entered.   Discussed with Attending MD.   Comments: Will admit to psychiatry.  Electronic Signatures: Orson Slick (MD)  (Signed 07-Oct-13 12:47)  Authored: Brief Consult Note   Last Updated: 07-Oct-13 12:47 by Orson Slick (MD)

## 2015-01-01 NOTE — Discharge Summary (Signed)
PATIENT NAME:  Mathew Aguilar, Mathew Aguilar MR#:  [id number] DATE OF BIRTH:  Sep 02, 1951  DATE OF ADMISSION:  07/11/2012 DATE OF DISCHARGE:  07/12/2012  HOSPITAL COURSE: See dictated history and physical for details of admission. Mathew Aguilar is a 64 year old man with a long history of alcohol dependence, depression, and impulsive behavior. He was admitted through the Emergency Room where he presented intoxicated and voicing suicidal ideation. By the following day the patient had sobered up and was completely denying any suicidal thoughts. He said that his mood was feeling much better. His affect was euthymic. He has insight into his alcohol problem and says that he has intentions to stop drinking although this has certainly been a longstanding problem for him. The patient's treatment in the hospital involved monitoring for alcohol withdrawal, although he was having normal vitals and no signs of seizures or DTs in the hospital. He was continued on the same medicines for depression, blood pressure, nasal congestion, hypothyroidism, and restless leg syndrome that he had been on prior to admission. The patient interacted appropriately with staff. Did not engage in any dangerous behavior. As is his habit, he became friendly with male patients on the unit very quickly. At times this can be a real distraction to the unit as a whole. The patient already has outpatient treatment in place. He does not need further detox. His mood is at his baseline. There did not appear to be any further benefit to inpatient hospitalization. The patient was agreeable to a plan for discharge on October 29th. Follow-up with his usual ACT team. Counseling done about the risk of continued alcohol consumption including further disability and death.   DISCHARGE MEDICATIONS:  1. Requip 1 mg p.o. at bedtime.  2. Prilosec 20 mg per day.  3. Cytomel 5 mcg per day.  4. Synthroid 50 mcg per day.  5. Atarax 50 mg at night.  6. Flonase nasal spray two  sprays both nostrils daily.  7. Cymbalta 60 mg per day.  8. Atenolol 25 mg per day.  9. Abilify 2 mg per day.   LABORATORY RESULTS: On admission his TSH was normal at 3.9. Drug screen was negative. Chemistry panel only showed a slightly elevated AST, otherwise normal. Alcohol level 152. The CBC showed an elevated hematocrit at 52.4, elevated hemoglobin at 18.4. That is normal for him. Urinalysis was unremarkable. Acetaminophen and salicylates were negative.   MENTAL STATUS EXAM: Reasonably well groomed man in hospital attire compliant, cooperative, and pleasant with the interview. Eye contact good. Psychomotor activity normal with no tremor. Speech normal rate, tone, and volume. Affect euthymic, appropriate, reactive. Mood stated as being fine. Thoughts are lucid with no sign of loosening of associations. He is alert and oriented x4. No sign of delirium. Denies suicidal or homicidal ideation. Denies any hallucinations. He has reasonably good judgment about his mental health issues. He continues to have impaired judgment in terms of drinking but has insight about it. His baseline intelligence is probably low average. Short and long-term memory both show some chronic deficits.   DISPOSITION: He is discharged back home on his current medication with instructions to continue treatment with the ACT team and not to drink.   DIAGNOSIS PRINCIPLE AND PRIMARY:  AXIS I: Alcohol dependence.   SECONDARY DIAGNOSES:  AXIS I: Depression, not otherwise specified.   AXIS II: Deferred.   AXIS III:  1. Hypertension. 2. Gastric reflux. 3. Hypothyroid. 4. Chronic nasal congestion. 5. History of suicide attempt that resulted in amputation of the  right arm at about the shoulder.  6. Restless leg syndrome.   AXIS IV: Severe from chronic isolation, lack of support and resources, and the burden of his illness.   AXIS V: Functioning at time of discharge 55.  ____________________________ Gonzella Lex,  MD jtc:drc D: 07/14/2012 16:21:23 ET T: 07/15/2012 08:32:39 ET JOB#: [id number] Gonzella Lex MD ELECTRONICALLY SIGNED 07/15/2012 11:51

## 2015-01-01 NOTE — H&P (Signed)
PATIENT NAME:  Mathew Aguilar, Mathew Aguilar MR#:  [id number] DATE OF BIRTH:  Jul 14, 1951  DATE OF ADMISSION:  06/20/2012  REFERRING PHYSICIAN: Dr. Lenise Arena ATTENDING PHYSICIAN:  Orson Slick, M.D.   IDENTIFYING DATA: Mathew Aguilar a 64 year old male with a long history of depression and psychosis.   CHIEF COMPLAINT: The patient is unable to state.    HISTORY OF PRESENT ILLNESS: Mathew Aguilar was brought to the Emergency Room disorganized, loud and threatening suicide, with a blood alcohol level of 0.118. He is unable to provide much information, but from his ACT team we learned that at the end of September of this year the police and his ACT team were called to his apartment as the patient was acting strangely.  They found empty beer bottles there and evidence that the patient has not been compliant with medication. At that time the patient denied suicidal ideations, was able to contract for safety, and was not hospitalized. Apparently his drinking escalated. He has not been taking medications and was brought to the hospital again for alcohol detox and treatment of psychotic depression. The patient has been working with his ACT team for years. He apparently has been stable on a combination of Abilify and Paxil and Cymbalta.  The patient really is not able to provide much information. He states that he wants to go home and denies any problems with alcohol or drugs. He denies treatment noncompliance. He denies symptoms of psychosis but appears to attend to internal stimuli.   PAST PSYCHIATRIC HISTORY: Long history of depression in the 1970s. During one of his depressive episodes the patient attempted suicide. As a result he injured his arm that had to be amputated. He has been tried on numerous medications in the past including Lamictal, Remeron, Zyprexa, and lithium, but apparently he has been doing well on Abilify, low dose, and Cymbalta. He has a history of alcoholism but has been able to maintain long  periods of sobriety. Apparently his ACT team was surprised to find evidence that he has been drinking during crisis intervention at the end of September.   PAST MEDICAL HISTORY:  1. 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  2. Hypothyroidism. 3. Hypertension.   ALLERGIES: No known drug allergies.   MEDICATIONS ON ADMISSION:  1. Atenolol 25 mg daily.  2. Cymbalta 60 mg daily.  3. Flonase daily.  4. Synthroid 50 mcg daily.  5. Cytomel 5 mcg daily.  6. Prilosec 20 mg daily.  7. Requip 1 mg at bedtime.  8. Abilify 2 mg daily.  9. Ambien 10 mg at night.   SOCIAL HISTORY: He lives alone in an apartment. He is divorced. He mentioned a girlfriend; I am not sure if this is true or false. He is estranged from his family but has a son living in our area. He used to be employed part time at [employer] but has not been working lately.   REVIEW OF SYSTEMS: CONSTITUTIONAL: No fevers or chills. No weight changes. EYES: No double or blurred vision. ENT: No hearing loss.  RESPIRATORY: No shortness of breath or cough. CARDIOVASCULAR: No chest pain or orthopnea. GI: No abdominal pain, nausea, vomiting, or diarrhea. GU: No incontinence or frequency. ENDOCRINE: History of hypothyroidism. LYMPHATIC: No anemia or easy bruising. INTEGUMENT: No acne or rash.  MUSCULOSKELETAL: Status post arm amputation. NEUROLOGIC: No tingling or weakness.  PSYCHIATRIC: See history of present illness for details.   PHYSICAL EXAMINATION:  VITAL SIGNS: Blood pressure 127/79, pulse  74, respirations 20, temperature 96.8.   GENERAL: This is a well-developed, short man who is armless on the right side.   HEENT: The pupils are equal, round, and reactive to light. Sclerae anicteric.   NECK: Supple. No thyromegaly.   LUNGS: Clear to auscultation. No dullness to percussion.   HEART: Regular rhythm and rate. No murmurs, rubs, or gallops.   ABDOMEN: Soft, nontender, nondistended. Positive bowel  sounds.   MUSCULOSKELETAL: Normal muscle strength in all extremities.   SKIN: No rashes or bruises.   LYMPHATIC: No cervical adenopathy.   NEUROLOGIC: Cranial nerves II through XII are intact.   LABORATORY DATA: Chemistries are within normal limits. Blood glucose 59. Blood alcohol level is 0.118. LFTs within normal limits except for total bilirubin 1.4. AST 49, TSH 3.39. Urine tox screen negative for substances. CBC within normal limits. Urinalysis not done. Serum acetaminophen and salicylates are low.  MENTAL STATUS EXAMINATION ON ADMISSION: The patient is alert and oriented to person and place only. He is not certain about his situation. His story is unclear and convoluted. He is marginally groomed, wearing hospital scrubs and a yellow shirt. He maintains some eye contact. His speech is mumbled, difficult to understand. Mood is depressed with flat affect. Thought processing is slow. Thought content: He denies suicidal or homicidal ideation but was brought to the hospital for threatening suicide. There are no delusions or paranoia. He appears to attend to internal stimuli. His cognition is impaired. His insight and judgment are questionable.   SUICIDE RISK ASSESSMENT ON ADMISSION: This is a patient with a lifelong history of depression and possibly psychosis and serious suicide attempt in the past who was brought to the hospital after he became suicidal while on a drinking binge.   DIAGNOSES:  AXIS I:   Major depressive disorder, recurrent, severe, with psychotic features.  Alcohol dependence.   AXIS II: Deferred.   AXIS III: Status post arm amputation. Hypothyroidism. Hypertension.   AXIS IV: Mental illness, substance abuse, primary support.   AXIS V: GAF 25.   PLAN: The patient was admitted to [facility] unit for safety, stabilization, and medication management. He was initially placed on suicide precautions and was closely monitored for any  unsafe behaviors. He underwent full psychiatric and risk assessment. He received pharmacotherapy, individual and group psychotherapy, substance abuse counseling, and support from therapeutic milieu.  1. Suicidal ideation: It is difficult to assess if the patient is suicidal. We will monitor closely. 2. Alcohol detox: The patient was placed on CIWA protocol in the Emergency Room. We will continue that with close vital signs monitoring. 3. Depression: We will continue Cymbalta and Abilify. We will likely increase the Abilify dose to 5 - 10 milligrams. It is quite possible that the patient is more psychotic than we can appreciate due to drinking and possibly cognitive decline.  4. Medical: We will continue Synthroid and antihypertensives.  5. Substance abuse treatment: The patient is in no shape to discuss it now.   DISPOSITION: To be established.   ____________________________ Wardell Honour. Bary Leriche, MD jbp:bjt D: 06/21/2012 19:10:04 ET T: 06/22/2012 06:02:43 ET JOB#: [id number]  cc: Jakob Kimberlin B. Bary Leriche, MD, <Dictator> Clovis Fredrickson MD ELECTRONICALLY SIGNED 06/22/2012 21:38
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
## 2015-01-04 NOTE — Consult Note (Signed)
Brief Consult Note: Diagnosis: Major depressive disorder.   Patient was seen by consultant.   Consult note dictated.   Recommend further assessment or treatment.   Orders entered.   Discussed with Attending MD.   Comments: Will admit to psychiatry.  Electronic Signatures: Orson Slick (MD)  (Signed 25-Aug-14 15:05)  Authored: Brief Consult Note   Last Updated: 25-Aug-14 15:05 by Orson Slick (MD)
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
## 2015-01-04 NOTE — Consult Note (Addendum)
Brief Consult Note: Diagnosis: Major depressive disorder, Alcohol dependence.   Patient was seen by consultant.   Consult note dictated.   Recommend further assessment or treatment.   Orders entered.   Discussed with Attending MD.   Comments: Will admit to psychiatry.  Electronic Signatures: Orson Slick (MD)  (Signed 14-Apr-14 15:17)  Authored: Brief Consult Note   Last Updated: 14-Apr-14 15:17 by Orson Slick (MD)
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
## 2015-01-04 NOTE — Consult Note (Signed)
Details:    - Psychiatry: Patient seen. Patient well known to [place] with a history of alcohol dependecne and bipolar disorder. Patient made a suicidal statement when drunk but now is sober and denies any suicidal ideation and is not depressed and not manic. Calm affect with lucid thinking. No acute psychosis. Cooperative with outpt meds. Has an ACT team providiing intensive outpt care. Education and suppportive counceling done and patient instructed to not drink. Does not need medical detox. Vitals and labs reviewed. Dx alcohol dep and bipolar disorder. Plan is for discharge and dc of IVC. PAtient agrees. ER MD agrees to plan.   Electronic Signatures: Gonzella Lex (MD)  (Signed 17-Jan-14 15:28)  Authored: Details   Last Updated: 17-Jan-14 15:28 by Gonzella Lex (MD)
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
## 2015-01-04 NOTE — Discharge Summary (Signed)
PATIENT NAME:  Mathew Aguilar, Mathew Aguilar MR#:  [id number] DATE OF BIRTH:  04-30-1951  DATE OF ADMISSION:  12/31/2012 DATE OF DISCHARGE:  01/04/2013  HOSPITAL COURSE: See dictated history and physical for details of admission. This 64 year old man with a long history of recurrent depression with psychotic features and alcohol abuse came into the hospital having cut himself on the wrist. He was reporting suicidal ideation. He could not really explain himself.  He just felt like his mood had gotten worse. He also said he was having auditory hallucinations that were getting worse. He was on a lower dose of Abilify than he had been taking previously. The patient had not been back to drinking again and had been maintaining his sobriety. In the hospital, he has not repeated any suicidal behavior and has denied suicidal ideation. I increased his dose of Abilify to 10 mg per day. His cut was treated and stabilized. The patient attended groups and individual therapy appropriately. He showed good insight. He was agreeable to continuing to work with the ACT team in the community. By the time of discharge, he completely denied any suicidal ideation, showed much calmer and more appropriate affect and behavior. He understood the importance of continuing to stay sober.   LABORATORY RESULTS: Admission labs include a drug screen that is negative. TSH normal at 2.5. Alcohol undetected. Chemistry panel largely unremarkable. CBC unremarkable. Acetaminophen and salicylates undetected.   DISCHARGE MEDICATIONS: Triple Antibiotic Ointment to the infected area or cut area on his arm twice a day for 6 days, Abilify 10 mg per day, Ambien 10 mg at night, Requip 3 mg at night, Prilosec 20 mg in the morning, Synthroid 50 mcg in the morning, Atarax 50 mg at night, Flonase nasal spray 2 sprays both nostrils daily, Cymbalta 60 mg b.i.d., Tenormin 25 mg per day.   MENTAL STATUS EXAM AT DISCHARGE: Neatly groomed man who looks his stated age,  cooperative with the interview. Good eye contact. Normal psychomotor activity. Speech normal rate, tone and volume. Affect still a little bit blunted as usual but not depressed, not tearful. Denies suicidal or homicidal ideation. He states that his mood is feeling good. He is lucid without any obviously bizarre or disorganized thinking. Denies any hallucinations. Shows improved insight and judgment. Baseline intelligence low average. Intact short and long-term memory.   DISPOSITION: Discharge back to his home.   FOLLOWUP: With the ACT team.   DIAGNOSIS, PRINCIPAL AND PRIMARY:  AXIS I: Major depression, recurrent, severe, with psychotic features.   SECONDARY DIAGNOSES: AXIS I: Alcohol dependence, in early remission.   AXIS II: Deferred.   AXIS III:  1.  Hypertension.  2.  Restless legs syndrome.  3.  Hypothyroidism.  4.  Gastric reflux symptoms.  5.  A cut on his wrist.   AXIS IV: Severe from chronic illness and loneliness.   AXIS V: Functioning at time of discharge 55.   ____________________________ Gonzella Lex, MD jtc:cb D: 01/05/2013 15:18:15 ET T: 01/05/2013 17:54:16 ET JOB#: [id number]  cc: Gonzella Lex, MD, <Dictator> Gonzella Lex MD ELECTRONICALLY SIGNED 01/09/2013 22:58
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
## 2015-01-05 NOTE — Discharge Summary (Signed)
PATIENT NAME:  Mathew Aguilar, Mathew Aguilar MR#:  [id number] DATE OF BIRTH:  [birth year]  DATE OF ADMISSION:  02/28/2014 DATE OF DISCHARGE:  03/06/2014  HOSPITAL COURSE: See dictated history and physical for details of admission. A 64 year old man with a history of recurrent major depression and alcohol abuse, came into the hospital with depression and suicidal ideation but without acute alcohol use. In the hospital, he has been treated with medication and group and individual therapy. He has been cooperative with treatment. He has not shown any suicidal or dangerous behavior and has shown good insight. His medication was adjusted with a gradual increase in his Remeron dose to 45 mg at night and adjustment of Abilify to 10 mg twice a day. He continued to have some anxiety and dysphoria for several days but, at this point, says that his mood is much better. He still feels a little bit jittery but feels comfortable with going home. He has ACT team support in the community. The patient is aware of the importance of continuing to stay sober if he wants to maintain his mood. He is agreeable to all of this. No sign of psychosis currently. Tolerating medicine well.   MENTAL STATUS EXAM AT DISCHARGE: Casually dressed and groomed gentleman who looks his stated age, cooperative with the interview. Good eye contact. Normal psychomotor activity. Speech normal rate, tone and volume. Affect euthymic, reactive, appropriate. Mood stated as okay. Thoughts are lucid. No evidence of loosening of associations or delusions. Denies auditory or visual hallucinations. Denies suicidal or homicidal ideation. Shows improved judgment and insight. Normal intelligence. Alert and oriented x 4. Short and long-term memory intact.   DISCHARGE MEDICATIONS: Remeron 45 mg at night, cetirizine 10 mg once a day, ropinirole 3 mg at night, aripiprazole 10 mg twice a day, hydroxyzine 50 mg at night, zolpidem 10 mg at night, atenolol 25 mg once a day, albuterol  measured-dose inhaler 2 puffs q.4 hours  p.r.n. for shortness of breath, pantoprazole 40 mg once a day.   LABORATORY, DIAGNOSTIC AND RADIOLOGICAL DATA:  Admission labs included EKG with a slight sinus bradycardia, unremarkable otherwise. Salicylates and acetaminophen negative. Alcohol level negative. Chemistry panel: Slightly low sodium 135, slightly elevated bilirubin 1.2; otherwise unremarkable. Hematocrit is elevated at 52.2, otherwise unremarkable CBC. Urinalysis normal. Drug screen positive for barbiturates.   DISPOSITION: Discharge home. Follow up with [facility] ACT team.  DIAGNOSIS, PRINCIPAL AND PRIMARY:  AXIS I: Major depression, recurrent, severe.   SECONDARY DIAGNOSES: AXIS I: Alcohol dependence, in early remission.  AXIS II: Deferred.  AXIS III: Chronic obstructive pulmonary disease, gastric reflux symptoms, seasonal allergies, restless leg syndrome, hypertension.  AXIS IV: Moderate to severe from isolation.  AXIS V: Functioning at time of discharge is 55.   ____________________________ Gonzella Lex, MD jtc:cs D: 03/06/2014 11:58:06 ET T: 03/06/2014 18:52:06 ET JOB#: [id number]  cc: Gonzella Lex, MD, <Dictator> Gonzella Lex MD ELECTRONICALLY SIGNED 03/06/2014 22:20

## 2015-01-05 NOTE — Consult Note (Signed)
PATIENT NAME:  Mathew Aguilar, CANAL MR#:  [id number] DATE OF BIRTH:  [birth year]  DATE OF CONSULTATION:  04/19/2014  REFERRING PHYSICIAN:   CONSULTING PHYSICIAN:  Gonzella Lex, MD  IDENTIFYING INFORMATION AND REASON FOR CONSULTATION: A 64 year old man came into the Emergency Room voluntarily seeking treatment for alcohol withdrawal.   HISTORY OF PRESENT ILLNESS: Information obtained from the patient and the chart. The patient came into the Emergency Room on his own volition saying that he needed to stop drinking and he had finally decided that he had to do something about it and wanted detoxification. He says that he been drinking about a 12-pack a day. He is a little vague about when his last drink was. He says that what really scared him was that he was not eating during the time when he was drinking. He started feeling more sick and becoming more aware of the physical problems related to it. He does not report any hallucinations. His mood has been feeling down, but he denies suicidal ideation. He has been off his medicine for the last couple of days. It does not sound like he has been in very close contact with the ACT team.   HISTORY OF PRESENT ILLNESS: This patient has had multiple hospitalizations in the past, most of which have revolved around alcohol in one degree or another, although he also has a history of severe major depression which at times has presented with psychotic features. He was last here in the hospital in June, at which time the problem appeared to be more about his depression than his drinking. He has a history of very serious suicide attempts in the past and of impulsive erratic behavior, especially while intoxicated. He is seen by an ACT team outpatient.   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    SOCIAL HISTORY: Lives by himself in a supervised apartment; finds it stressful because of the other mentally ill and needy people around there. It sounds like his social life has been  more impaired recently; I know it was earlier in the summer. Does not have very close contact with his family.   SUBSTANCE ABUSE HISTORY: Long history of alcohol dependence. Has had seizures and DTs in the past. Does not routinely abuse other drugs.   FAMILY HISTORY: None identified.   REVIEW OF SYSTEMS: Feeling sad, down, remorseful, a little bit shaky, still stomach is not feeling quite right, although he has been able to eat here. No other specific physical complaints.   MENTAL STATUS EXAMINATION: Disheveled gentleman who looks his stated age, cooperative with the interview. Eye contact decreased. Psychomotor activity slow. Speech quiet and slow, and decreased in amount. Affect is a little bit blunted, slightly tearful at times. Mood stated as being bad. Thoughts are lucid but slow. No obvious loosening of associations or delusions. Denies auditory or visual hallucinations. Denies suicidal or homicidal ideation. Appears to be grossly intact in terms of his memory, judgment, insight, and being alert and oriented.   LABORATORY RESULTS: Alcohol level negative. Salicylates and acetaminophen negative. Chemistry panel: His bilirubin is up a little bit at 1.1. AST is up at 41, ALT up at 69. CBC shows a chronic elevated hematocrit at 54.5. He has had that in the past. Urinalysis negative. Drug screen negative.   CURRENT MEDICATIONS: He is supposed to be taking mirtazapine  45 mg at night, Zyrtec 10 mg per day, Requip 3 mg at night, Abilify 10 mg twice a day, hydroxyzine 50 mg at night, Ambien 10 mg at night p.r.n. sleep, atenolol 25 mg per day, pantoprazole 40 mg per day, and albuterol inhaler p.r.n.   ALLERGIES: No known drug allergies.   VITAL SIGNS: His blood pressure right now in the Emergency Room is 144/83, pulse  65, respirations 20, temperature 98.3.   ASSESSMENT: A 63 year old man with a chronic and severe history of both alcohol abuse and depression. He is looking a little bit on the down, depressed side. He is slightly tearful, seems to be a little bit hopeless. He is not reporting suicidal thoughts or acute psychosis, but he is asking for hospitalization because of his drinking. He does have a history of some complicated withdrawal in the past and additionally has a history of dangerous behavior when he is drinking. I think it is justified given his minimal outpatient support to admit him to the hospital even if only briefly.   TREATMENT PLAN: Admit to psychiatry. Alcohol withdrawal orders in place. Other medications reordered. Educated patient about the plan, which he is agreeable to.   DIAGNOSIS, PRINCIPAL AND PRIMARY:  AXIS I: Alcohol abuse, moderate to severe.   SECONDARY DIAGNOSES: AXIS I: Major depression, severe, recurrent, in partial remission.  AXIS II: No diagnosis.  AXIS III: High blood pressure, chronic obstructive pulmonary disease, gastric reflux symptoms, status post traumatic amputation of the left arm.    ____________________________ Gonzella Lex, MD jtc:at D: 04/19/2014 13:43:09 ET T: 04/19/2014 14:21:02 ET JOB#: [id number]  cc: Gonzella Lex, MD, <Dictator> Gonzella Lex MD ELECTRONICALLY SIGNED 05/25/2014 16:54

## 2015-01-05 NOTE — Consult Note (Signed)
Brief Consult Note: Diagnosis: major depression recurrent and alcohol abuse.   Patient was seen by consultant.   Consult note dictated.   Discussed with Attending MD.   Comments: Psychiatry: PAtient seen. Note dictated. Patient was intoxicated and angry and made suicidal statements to ACT team but did not try to hurt self. Now calm and in a better frame of mind. Denies suicidal thoughts. Agrees to outpt care as usual. Will advise release and follow up with outpt ACT team.  Electronic Signatures: Gonzella Lex (MD)  (Signed 14-Jan-15 12:13)  Authored: Brief Consult Note   Last Updated: 14-Jan-15 12:13 by Gonzella Lex (MD)

## 2015-01-05 NOTE — Consult Note (Signed)
Brief Consult Note: Diagnosis: substance induced mood disorder.   Patient was seen by consultant.   Consult note dictated.   Discussed with Attending MD.   Comments: Psychiatry: Patient seen. Note dictated. Chart reviewed. Patient no longer intoxicated and now denies any suicidal ideation. Affect upbeat. Not requiring detox. Discharge advised as he has ACT team services. IVC discontinued.  Electronic Signatures: Gonzella Lex (MD)  (Signed 04-Feb-15 10:58)  Authored: Brief Consult Note   Last Updated: 04-Feb-15 10:58 by Gonzella Lex (MD)

## 2015-01-05 NOTE — H&P (Signed)
PATIENT NAME:  Mathew Aguilar, Mathew Aguilar MR#:  [id number] DATE OF BIRTH:  1951/03/15  DATE OF ADMISSION:  07/31/2014  DATE OF ASSESSMENT: 07/31/2014   REFERRING PHYSICIAN: Emergency Room MD.   ATTENDING PHYSICIAN: Pharell Rolfson B. Bary Leriche, MD   IDENTIFYING DATA: Mathew Aguilar is a 64 year old patient with a history of schizoaffective disorder.   CHIEF COMPLAINT: "I'm suicidal."   HISTORY OF PRESENT ILLNESS: Mathew Aguilar returns to the hospital for one of his multiple hospitalizations for worsening of depression and suicidal ideation. The patient has been working with the ACT Team and reports good medication compliance, but recently, he has been rather lonely, does not talk to his neighbors, and no longer has a girlfriend like he  used to. He used to be employed a few years back. He is extremely bored at home. When asked if he would like to go to a [facility] for programming during the day, he declined.   He, at this time around, denies any psychotic symptoms, although in the past he had auditory command hallucinations. He reports poor sleep, decreased appetite, anhedonia, feeling of guilt, hopelessness, worthlessness, poor energy and concentration, social isolation. He denies crying. He reports suicide attempt, without intention or a plan.   PAST PSYCHIATRIC HISTORY: He has multiple, multiple psychiatric hospitalizations, mostly for depression. He had several suicide attempts. He lost his arm during one of the attempts. He has been on numerous medications over the years. When he comes to the hospital, usually his antidepressants are switched around. He is currently taking Remeron. 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 There is a history of drinking in the past, but the patient denies any current alcohol or substance use.   FAMILY PSYCHIATRIC HISTORY: None reported. No completed suicides.   PAST MEDICAL HISTORY: Hypertension, GERD, hypothyroidism, restless leg syndrome.   ALLERGIES: No known drug  allergies.   MEDICATIONS ON ADMISSION: Albuterol inhaler as needed for shortness of breath, Abilify 20 mg daily, atenolol 25 mg daily, Atarax 50 mg at bedtime, pantoprazole 40 mg daily, Requip 3 mg at bedtime, Remeron 45 mg at bedtime.   SOCIAL HISTORY: He is disabled from mental illness. He lives independently. He works with the [employer]; they visit with him once a week. He used to have girlfriends and oftentimes, it was a source of upset and hospitalization; now, he says that he has 58 male friend, but she lives in [place] and does not visit, too often.   REVIEW OF SYSTEMS:  CONSTITUTIONAL: No fevers or chills. No weight changes.  EYES: No double or blurred vision.  EARS, NOSE, AND THROAT: No hearing loss.  RESPIRATORY: No shortness of breath or cough.  CARDIOVASCULAR: No chest pain or orthopnea.  GASTROINTESTINAL: No abdominal pain, nausea, vomiting, or diarrhea.  GENITOURINARY: No incontinence or frequency.  ENDOCRINE: No heat or cold intolerance.  LYMPHATIC: No anemia or easy bruising.  INTEGUMENTARY: No acne or rash.  MUSCULOSKELETAL: No muscle or joint pain.  NEUROLOGIC: No tingling or weakness.  PSYCHIATRIC: See history of present illness for details.   PHYSICAL EXAMINATION:  VITAL SIGNS: Blood pressure 138/76, pulse 76, respirations 18, temperature 98.3.  GENERAL: This is a well-developed male in no acute distress.  HEENT: The pupils are equal, round, and reactive to light. Sclerae anicteric.  NECK: Supple. No thyromegaly.  LUNGS: Clear to auscultation. No dullness to percussion.  HEART: Regular rhythm and rate. No murmurs, rubs, or gallops.  ABDOMEN: Soft, nontender, nondistended. Positive bowel sounds.  MUSCULOSKELETAL: Normal muscle strength in  all extremities. The patient has a traumatic amputation at the shoulder.  SKIN: No rashes or bruises.  LYMPHATIC: No cervical adenopathy.  NEUROLOGIC: Cranial nerves II through XII are intact.   LABORATORY DATA: Chemistries  are within normal limits, except for a sodium of 135. Blood alcohol level is 0. LFTs within normal limits, except for total bilirubin of 1.4, AST 51 and ALT of 110. Urine tox screen is negative for substances. CBC is within normal limits. Urinalysis is not suggestive of urinary tract infection. Serum acetaminophen and salicylates are low.   MENTAL STATUS EXAMINATION ON ADMISSION: The patient is alert and oriented to person, place, time and situation. He is pleasant, polite and cooperative. He recognizes me from previous admissions. He maintains good eye contact. His speech is of normal rhythm, rate and volume, rather low and soft. Mood is depressed, with flat affect. Thought process is logical and goal oriented. Thought content: He denies thoughts of hurting himself or others at the moment, but was admitted after voicing suicidal ideation at the time of admission. He is not paranoid or delusional. There are no auditory or visual hallucinations. His cognition is grossly intact. Registration, recall, short- and long-term memory are intact. He is of average intelligence and fund of knowledge. His insight and judgment are fair.   SUICIDE RISK ASSESSMENT ON ADMISSION: This is a patient with a long history of depression and mood instability, who came to the hospital complaining of suicidal ideation.   INITIAL DIAGNOSES:  AXIS I: Schizoaffective disorder, bipolar type.  AXIS II: Deferred.  AXIS III: Hypertension, restless leg syndrome, hypothyroidism, status post arm amputation.   PLAN: The patient was admitted to [facility] for safety, stabilization and medication management.  1.  Suicidal ideation. He is able to contract for safety.  2.  Mood. He is maintained on Abilify for mood stabilization. We will lower Remeron to 15 mg to facilitate sleep and start Effexor 150 mg in the morning for depression.  3.  Medical. We will continue all his medications, as in the  community for high blood pressure, hypothyroidism, gastroesophageal reflux disease, and restless leg syndrome.   DISPOSITION: He will be discharged to home. He will follow up with the ACT Team.    ____________________________ Wardell Honour. Bary Leriche, MD jbp:MT D: 08/01/2014 15:20:23 ET T: 08/01/2014 16:25:26 ET JOB#: [id number]  cc: Cesar Rogerson B. Bary Leriche, MD, <Dictator> Clovis Fredrickson MD ELECTRONICALLY SIGNED 08/16/2014 17:21

## 2015-01-05 NOTE — Consult Note (Signed)
Brief Consult Note: Comments: Psychiatry: Chart reviewed and case discussed with psychiatric ER nurse. PAtient well known to [place]. Chronic alcohol abuser who routinely voices SI when intoxicated and just as routinely denies it once he sobers up. Unlikely to benifit from [facility] admit or to need 2 nights of treatment. Advise letting him sober up in the [place]. If he is still suicidal tomorrow we can reconsider.  Electronic Signatures: Gonzella Lex (MD)  (Signed 03-Feb-15 23:21)  Authored: Brief Consult Note   Last Updated: 03-Feb-15 23:21 by Gonzella Lex (MD)

## 2015-01-06 NOTE — Discharge Summary (Signed)
PATIENT NAME:  Mathew Aguilar, Mathew Aguilar MR#:  [id number] DATE OF BIRTH:  1951-01-27  DATE OF ADMISSION:  10/22/2011 DATE OF DISCHARGE:  10/27/2011  HOSPITAL COURSE: See dictated History and Physical for details. This 64 year old man with a history of chronic and recurrent severe depression came into the hospital brought in by his [employer] because of worsening depression and suicidal statements. Initially, he presented with severely depressed mood and affect, suicidal ideas, inability to contract for safety. The patient was unwilling to discuss his recent stresses, but the information we obtained was that he had had some kind of conflict at work in which he was chastised and it seemed like this may have set off his worsening depression. There does not seem to have been any new change in his medication. He does not appear to have been abusing substances. The patient was continued on psychiatric medicine while in the hospital. He was treated with a combination of aripiprazole,  Duloxetine, Cytomel and paroxetine. He tolerated medications well. He gradually showed improvement in his mood. He became more interactive and appropriate. He attended groups. By the time of discharge he was consistently denying suicidal ideation. At no time did he show any suicidal or aggressive behavior. His Conservation officer, nature and Group Home were in agreement with his coming back to outpatient treatment. The patient received counseling about the use of medication and stress management techniques as well as supportive therapy. He responded well to this.   DISCHARGE MEDICATIONS:  1. Abilify 5 mg per day.  2. Ambien 10 mg at bedtime.  3. Requip 1 mg at bedtime.  4. Paroxetine 40 mg daily.  5. Cytomel 5 mcg per day.  6. Synthroid 50 mcg per day. 7. Cymbalta 60 mg per day. 8. Atenolol 25 mg per day. 9. Amoxicillin 500 mg every 8 hours, which was just to be continued for the next three days after discharge.   LABORATORY,  DIAGNOSTIC AND RADIOLOGICAL DATA: The patient had complained of some pain in his left shoulder. Since it is the only shoulder he really has, we went ahead and X-rayed it. There was mild arthritic/degenerative changes but no sign of any other lesion. Chemistry panels showed a negative urinalysis, drug screen positive for MDMA which may have been a false positive-no evidence recently of drug abuse. TSH normal. White blood cell count and other blood cell counts were normal. Alcohol undetectable. Chemistry unremarkable.   MENTAL STATUS EXAM AT DISCHARGE: A reasonably well dressed and groomed man who looks his stated age, cooperative with the interview. Eye contact improved. Psychomotor activity normal. Speech still quiet and decreased in total amount but more appropriate than on admission. Affect is still a little bit constricted but more reactive and more upbeat than on admission. Mood stated as good. Thoughts appear generally lucid and directed, although a little bit simple. No evidence of thought disorder. He denied hallucinations. He denied suicidal or homicidal ideation. He expressed understanding of the treatment plan and agreed to discharge home.   DISPOSITION: Discharge back to the group home he had been staying at. Follow-up with his Conservation officer, nature.   DIAGNOSIS, PRINCIPAL AND PRIMARY:  AXIS I: Major depressive episode, severe, recurrent.   SECONDARY DIAGNOSES:  AXIS I: No further.   AXIS II: Deferred.   AXIS III:  1. Hypothyroidism. 2. Hypertension. 3. Status post right arm amputation above the elbow. 4. Upper respiratory infection, resolving.   AXIS IV: Moderate-to-severe from chronic diminished social support and chronic illness.  AXIS V: Functioning at the time of discharge 54.  ____________________________ Gonzella Lex, MD jtc:cbb D: 11/09/2011 14:03:56 ET T: 11/09/2011 14:29:29 ET JOB#: [id number] Gonzella Lex MD ELECTRONICALLY SIGNED 11/09/2011 14:58
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
## 2015-01-13 NOTE — Consult Note (Signed)
PATIENT NAME:  Mathew Aguilar, HALLOWELL MR#:  [id number] DATE OF BIRTH:  12-28-1950  DATE OF CONSULTATION:  12/06/2014  REFERRING PHYSICIAN:   CONSULTING PHYSICIAN:  Gonzella Lex, MD  IDENTIFYING INFORMATION AND REASON FOR CONSULTATION: A 64 year old man with a history of depression and alcohol abuse comes into the hospital.   CHIEF COMPLAINT: "I really need to stop."   HISTORY OF PRESENT ILLNESS: The patient states that he has been back to drinking heavily, doing at least a 12 pack a day. His mood has been feeling down. He has been having suicidal thoughts. Thought about walking out into traffic. His sleep has been adequate. Energy level has been poor. Has been feeling generally negative about himself. No homicidal ideation. He is having minimal social interaction, barely getting out of the house. He says he is still compliant with all of his prescribed medicines. Still sees the ACT team regularly. Has denied having any psychotic symptoms other than his drinking. Denies any specific major new stresses.   PAST PSYCHIATRIC HISTORY: Multiple previous admissions under similar circumstances. The patient also however has a serious history of suicide attempts in the past complicating assessment of his mood disorder. He has tried to kill himself on more than one occasion with serious intent. Also has a history of intermittent alcohol withdrawal. He has a history of psychotic symptoms at times in the past with his depression.   SOCIAL HISTORY: Lives by himself. Minimal social activity. Not currently in any kind of relationship. Sees his ACT team regularly.   FAMILY HISTORY: Positive for substance abuse.   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 Has high blood pressure. Has restless leg syndrome. Has COPD, mild.   SUBSTANCE ABUSE HISTORY: Intermittent alcohol abuse. He had been staying sober or mostly sober for quite a while, but got back into it in the last  couple of weeks or so. Denies that he has had serious problems with abusing other drugs in the past. Has had some complicated withdrawal, not full DTs as far as I know.  CURRENT MEDICATIONS: Albuterol inhaler 2 puffs q. 4 hours p.r.n. for shortness of breath, Abilify 20 mg daily, atenolol 25 mg daily, Atarax 50 mg at night, mirtazapine 45 mg at night, pantoprazole 40 mg daily, Requip 3 mg at night, venlafaxine extended release 150 mg daily, Ambien 10 mg at night.   ALLERGIES: No known drug allergies.   REVIEW OF SYSTEMS: Depressed mood. Suicidal ideation with plan to walk out in front of traffic. No hallucinations. Mild nausea. Decreased appetite.   MENTAL STATUS EXAMINATION: Disheveled gentleman who looks his stated age, cooperative with the interview. Poor eye contact. Psychomotor activity sluggish. Speech minimal in amount and quiet. Affect flat. Mood stated as depressed. Thoughts are lucid without loosening of associations. Denies auditory or visual hallucinations. Endorses suicidal ideation with plan. No homicidal ideation. He is alert and oriented x3 but gets the year wrong. The patient is able to repeat 3 words immediately, only remembers 1 of them at three minutes. Judgment and insight chronically shows some degree of impairment. His baseline intelligence and fund of knowledge are lower than average.   LABORATORY RESULTS: Urinalysis shows glucose to be very positive, over 500. CBC normal. Acetaminophen, alcohol and salicylates negative. Chemistry panel, normal glucose. Elevated ALT and AST at 145, respectively.  VITAL SIGNS: Blood pressure 145/85, respirations 18, pulse 70, temperature 98.   ASSESSMENT: A 64 year old man with major depression, recurrent, severe, and sometimes  with psychotic features, also alcohol abuse, currently with active suicidal ideation and thought of walking in front of traffic. Also relapsed into heavy alcohol use despite appropriate outpatient treatment. Needs  hospitalization for safety.   TREATMENT PLAN: Readmit to psychiatry. Continue current medicines. Suicide precautions and fall precautions in place. Psychoeducation completed with the patient. Emergency Room physician informed of plan and is agreeable. Continue outpatient medicines as prescribed for now.   DIAGNOSIS, PRINCIPAL AND PRIMARY:  AXIS I: Major depression, recurrent, severe.   SECONDARY DIAGNOSES: AXIS I: Alcohol abuse, severe.  AXIS II: Deferred.  AXIS III: Status post traumatic amputation of the right arm, hypertension, chronic obstructive pulmonary disease.  ____________________________ Gonzella Lex, MD jtc:sb D: 12/06/2014 12:32:00 ET T: 12/06/2014 12:44:46 ET JOB#: [id number]  cc: Gonzella Lex, MD, <Dictator> Gonzella Lex MD ELECTRONICALLY SIGNED 12/17/2014 9:59

## 2015-01-13 NOTE — Consult Note (Signed)
Brief Consult Note: Diagnosis: alcohol abuse major depression.   Comments: Psychiatry: Well known patient with both alcohol abuse and depression. currently alcohol level is too high to make a psychiatric evaluation meaningful. Will re-evaluate tomorrow am.  Electronic Signatures: Loc Feinstein, Madie Reno (MD)  (Signed 23-Mar-16 17:39)  Authored: Brief Consult Note   Last Updated: 23-Mar-16 17:39 by Gonzella Lex (MD)

## 2015-01-13 NOTE — H&P (Signed)
PATIENT NAME:  Mathew Aguilar, Mathew Aguilar MR#:  [id number] DATE OF BIRTH:  05-03-1951  DATE OF ADMISSION:  12/06/2014  REFERRING PHYSICIAN: Emergency Room MD   ATTENDING PHYSICIAN: Orson Slick, MD   IDENTIFYING DATA: Mathew Aguilar is a 64 year old male with history of depression.   CHIEF COMPLAINT: "I'm very depressed."   HISTORY OF PRESENT ILLNESS: Mathew Aguilar has a long history of depression, mood instability, and psychosis. He has been stable on his current combination of medicines, but every so often he returns to the hospital complaining of suicidal ideation. He does have a history of serious suicide attempts. He reports that for the past 3 weeks he has been feeling down and relapsed on alcohol, drinking a 12 pack a day. He believes that he needs detox. His depression has gotten worse with poor sleep, decreased appetite, anhedonia, feeling of guilt, hopelessness, worthlessness, poor energy and concentration, social isolation, and crying spells that culminated in thoughts of suicide with a plan to overdose on pills. He denies this time psychotic symptoms. Denies symptoms suggestive of bipolar mania. He denies other than alcohol substance use.   PAST PSYCHIATRIC HISTORY: Multiple psychiatric admissions for worsening of depression, suicidal ideation and suicide attempts. He has been tried on multiple medications including Lamictal, Remeron, Zyprexa, lithium, Paxil, Neurontin, Seroquel, and trazodone. There is history of drinking.   FAMILY PSYCHIATRIC HISTORY: None reported.   PAST MEDICAL HISTORY: Hypertension, restless legs, COPD, status post traumatic amputation of his right arm.   ALLERGIES: No known drug allergies.   MEDICATIONS ON ADMISSION: Albuterol as needed for shortness of breath, Abilify 20 mg daily, atenolol 25 mg daily, Atarax 50 mg at night, mirtazapine 45 mg at night, pantoprazole 40 mg daily, Requip 3 mg at night, Effexor 150 mg daily, Ambien 10 mg daily.   SOCIAL HISTORY: He  lives independently. He is in the care of [facility] ACT team. He used to work at a [employer] and enjoyed this activity tremendously giving him the opportunity to meet other people. He also enjoyed dating at some point. He is no longer in a relationship and his depression gets in the way of his social life.   REVIEW OF SYSTEMS: CONSTITUTIONAL: No fevers or chills. No weight changes.  EYES: No double or blurred vision.  ENT: No hearing loss.  RESPIRATORY: No shortness of breath or cough.  CARDIOVASCULAR: No chest pain or orthopnea.  GASTROINTESTINAL: No abdominal pain, nausea, vomiting, or diarrhea.  GENITOURINARY: No incontinence or frequency.  ENDOCRINE: No heat or cold intolerance.  LYMPHATIC: No anemia or easy bruising.  INTEGUMENTARY: No acne or rash.  MUSCULOSKELETAL: No muscle or joint pain.  NEUROLOGIC: No tingling or weakness.  PSYCHIATRIC: See history of present illness for details.   PHYSICAL EXAMINATION: VITAL SIGNS: Blood pressure 145/85, pulse 70, respirations 18, temperature 98.  GENERAL: This is a well-developed one-armed man in no acute distress.  HEENT: The pupils are equal, round, and reactive to light. Sclerae are anicteric.  NECK: Supple. No thyromegaly.  LUNGS: Clear to auscultation. No dullness to percussion.  HEART: Regular rhythm and rate. No murmurs, rubs, or gallops.  ABDOMEN: Soft, nontender, nondistended. Positive bowel sounds.  MUSCULOSKELETAL: Normal muscle strength in all extremities.  SKIN: No rashes or bruises.  LYMPHATIC: No cervical adenopathy.  NEUROLOGIC: Cranial nerves II through XII are intact.   LABORATORY DATA: Chemistries are within normal limits. Blood alcohol level is zero. LFTs within normal limits, except for AST of 45 and ALT of 100. CBC within normal  limits. Urinalysis is not suggestive of urinary tract infection, except for urine glucose over 500. Serum acetaminophen and salicylates are low.   MENTAL STATUS EXAMINATION ON ADMISSION:  The patient is alert and oriented to person, place, time and situation. He is pleasant, polite and cooperative. He is well groomed. He wears hospital scrubs. He maintains good eye contact. His speech is of normal rhythm, rate and volume. There is psychomotor retardation. His mood is depressed with flat affect. Thought process is logical and goal oriented. Thought content: He endorses suicidal ideation with a plan to overdose. There are no thoughts of hurting others. There are no delusions or paranoia. There are no auditory or visual hallucinations. His cognition is grossly intact. Registration, recall, short and long-term memory are intact. He is of average intelligence and fund of knowledge. His insight and judgment are fair.   SUICIDE RISK ASSESSMENT ON ADMISSION: This is a patient with long history of mental illness, depression, anxiety, mood instability, and suicide attempts who comes to the hospital complaining of worsening of depression and suicidal ideation with a plan in the context of relapse on alcohol.   INITIAL DIAGNOSES:  1.  Major depressive disorder, recurrent, severe. 2.  History of diagnosis of schizoaffective disorder.  3.  Alcohol use disorder, severe.  4.  Hypertension. 5.  Gastroesophageal reflux disease. 6.  Restless leg syndrome.   PLAN: The patient was admitted to [facility] for safety, stabilization and medication management.  1.  Suicidal ideation: The patient is able to contract for safety in the hospital.  2.  Mood: We will continue Abilify 20 mg as in the community for mood stabilization and Effexor 150 mg daily for depression.  3.  Insomnia: We will continue Ambien 10 mg at bedtime along with Remeron 45 mg at bedtime, although this is not a sleeping dose. We may need to decrease it.  4.  Restless leg: Continue Requip 3 mg at bedtime.  5.  Hypertension: We will continue atenolol 25 mg daily.  6.  Gastroesophageal reflux disease: We will give  pantoprazole 40 mg daily.  7.  Chronic obstructive pulmonary disease: We will continue inhalers.  8.  Disposition: He will be discharged to home. He will follow up with his ACT team.   ____________________________ Wardell Honour. Bary Leriche, MD jbp:sb D: 12/06/2014 14:44:16 ET T: 12/06/2014 15:14:07 ET JOB#: [id number]  cc: Reagen Haberman B. Bary Leriche, MD, <Dictator> Clovis Fredrickson MD ELECTRONICALLY SIGNED 12/10/2014 22:21
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
## 2015-02-11 ENCOUNTER — Encounter: Payer: Self-pay | Admitting: Emergency Medicine

## 2015-02-11 ENCOUNTER — Emergency Department
Admission: EM | Admit: 2015-02-11 | Discharge: 2015-02-11 | Disposition: A | Discharge status: Home / Self Care | Payer: Commercial Managed Care - HMO | Attending: Emergency Medicine | Admitting: Emergency Medicine

## 2015-02-11 DIAGNOSIS — R42 Dizziness and giddiness: Secondary | ICD-10-CM | POA: Diagnosis not present

## 2015-02-11 DIAGNOSIS — R51 Headache: Secondary | ICD-10-CM | POA: Diagnosis present

## 2015-02-11 DIAGNOSIS — H6122 Impacted cerumen, left ear: Secondary | ICD-10-CM | POA: Diagnosis not present

## 2015-02-11 DIAGNOSIS — I10 Essential (primary) hypertension: Secondary | ICD-10-CM | POA: Insufficient documentation

## 2015-02-11 DIAGNOSIS — R519 Headache, unspecified: Secondary | ICD-10-CM

## 2015-02-11 HISTORY — DX: Essential (primary) hypertension: I10

## 2015-02-11 HISTORY — DX: Depression, unspecified: F32.A

## 2015-02-11 HISTORY — DX: Major depressive disorder, single episode, unspecified: F32.9

## 2015-02-11 LAB — CBC
HCT: 51.4 % (ref 40.0–52.0)
HEMOGLOBIN: 17.4 g/dL (ref 13.0–18.0)
MCH: 29.9 pg (ref 26.0–34.0)
MCHC: 33.9 g/dL (ref 32.0–36.0)
MCV: 88.4 fL (ref 80.0–100.0)
Platelets: 228 10*3/uL (ref 150–440)
RBC: 5.81 MIL/uL (ref 4.40–5.90)
RDW: 13.1 % (ref 11.5–14.5)
WBC: 5.7 10*3/uL (ref 3.8–10.6)

## 2015-02-11 LAB — BASIC METABOLIC PANEL
ANION GAP: 10 (ref 5–15)
BUN: 10 mg/dL (ref 6–20)
CALCIUM: 9.1 mg/dL (ref 8.9–10.3)
CO2: 28 mmol/L (ref 22–32)
CREATININE: 0.82 mg/dL (ref 0.61–1.24)
Chloride: 91 mmol/L — ABNORMAL LOW (ref 101–111)
GFR calc non Af Amer: >60 mL/min (ref >60)
Glucose, Bld: 113 mg/dL — ABNORMAL HIGH (ref 65–99)
Potassium: 3.4 mmol/L — ABNORMAL LOW (ref 3.5–5.1)
SODIUM: 129 mmol/L — AB (ref 135–145)

## 2015-02-11 MED ORDER — MECLIZINE HCL 12.5 MG PO TABS: 12.5000 mg | ORAL_TABLET | Freq: Three times a day (TID) | ORAL | Status: DC | PRN

## 2015-02-11 NOTE — Discharge Instructions (Signed)
Your blood pressure appears to be good. Take Tylenol if needed for your headache. Take meclizine if needed for dizziness. Continue with the current hydrochlorothiazide. Follow-up with your regular doctor. Return to emergency department if you have any further urgent concerns.  Vertigo Vertigo means you feel like you are moving when you are not. Vertigo can make you feel like things around you are moving when they are not. This problem often goes away on its own.  HOME CARE   Follow your doctor's instructions.  Avoid driving.  Avoid using heavy machinery.  Avoid doing any activity that could be dangerous if you have a vertigo attack.  Tell your doctor if a medicine seems to cause your vertigo. GET HELP RIGHT AWAY IF:   Your medicines do not help or make you feel worse.  You have trouble talking or walking.  You feel weak or have trouble using your arms, hands, or legs.  You have bad headaches.  You keep feeling sick to your stomach (nauseous) or throwing up (vomiting).  Your vision changes.  A family member notices changes in your behavior.  Your problems get worse. MAKE SURE YOU:  Understand these instructions.  Will watch your condition.  Will get help right away if you are not doing well or get worse. Document Released: 06/09/2008 Document Revised: 11/23/2011 Document Reviewed: 03/19/2011 [facility] Patient Information 2015 [place], [place]. This information is not intended to replace advice given to you by your health care provider. Make sure you discuss any questions you have with your health care provider.

## 2015-02-11 NOTE — ED Provider Notes (Addendum)
[facility] Emergency Department Provider Note  ____________________________________________  Time seen: 51   I have reviewed the triage vital signs and the nursing notes.   HISTORY  Chief Complaint Hypertension and Headache     HPI Mathew Aguilar is a 64 y.o. male who presents due to concerns for his blood pressure and with a report of a headache and dizziness.  He is visited by the fact team. They took his blood pressure on Friday. Apparently it was elevated but he does not know what the level was. He says he ran out of his usual blood pressure medication. He has a prescription bottle with him for hydrochlorothiazide that was filled on May 24. It is 4 12-1/2 mg. I think the prior dose was 25 mg for him and the patient seems concerned about this.  He reports having intermittent headaches over the past week. He is taking vinegar at home. He reports after taking vinegar the headache tends to go away. He reports some mild vertigo symptoms.   His pressure currently is 130/88.    Past Medical History  Diagnosis Date  . Hypertension   . Depression     There are no active problems to display for this patient.   No past surgical history on file.  Current Outpatient Rx  Name  Route  Sig  Dispense  Refill  . meclizine (ANTIVERT) 12.5 MG tablet   Oral   Take 1 tablet (12.5 mg total) by mouth 3 (three) times daily as needed for dizziness or nausea.   15 tablet   1     Allergies Review of patient's allergies indicates no known allergies.  No family history on file.  Social History History  Substance Use Topics  . Smoking status: Never Smoker   . Smokeless tobacco: Not on file  . Alcohol Use: No    Review of Systems  Constitutional: Negative for fever. ENT: History of occlusion with wax. Cardiovascular: Negative for chest pain. Respiratory: Negative for shortness of breath. Gastrointestinal: Negative for abdominal pain, vomiting and  diarrhea. Genitourinary: Negative for dysuria. Musculoskeletal: Negative for back pain. Skin: Negative for rash. Neurological: Intermittent headaches. See history of present illness   10-point ROS otherwise negative.  ____________________________________________   PHYSICAL EXAM:  VITAL SIGNS: ED Triage Vitals  Enc Vitals Group     BP 02/11/15 0922 130/94 mmHg     Pulse Rate 02/11/15 0922 106     Resp 02/11/15 0922 18     Temp 02/11/15 0922 98.9 F (37.2 C)     Temp Source 02/11/15 0922 Oral     SpO2 02/11/15 0922 100 %     Weight 02/11/15 0922 150 lb (68.04 kg)     Height 02/11/15 0922 5' (1.524 m)     Head Cir --      Peak Flow --      Pain Score 02/11/15 0918 5     Pain Loc --      Pain Edu? --      Excl. in [place]? --     Constitutional: Alert and oriented. No distress. ENT   Head: Normocephalic and atraumatic.   Nose: No congestion/rhinnorhea.   Mouth/Throat: Mucous membranes are moist. "Ears: Occlusion of the left ear canal with wax. No erythema. No discharge. The wax appears somewhat hardened and aged. Cardiovascular: Normal rate, regular rhythm. Respiratory: Normal respiratory effort without tachypnea. Breath sounds are clear and equal bilaterally. No wheezes/rales/rhonchi. Gastrointestinal: Soft and nontender. No distention.  Back:  No muscle spasm, no tenderness, no CVA tenderness. Musculoskeletal: 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. Remainder of musculoskeletal exam is unremarkable. Neurologic:  Normal speech and language. No gross focal neurologic deficits are appreciated.  Ambulatory. Negative Romberg. Negative pronator drift with the left arm. Good finger to nose with the left hand. Cranial nerves II through XII are intact. Skin:  Skin is warm, dry. No rash noted. Psychiatric: Slightly odd affect. The patient is focused on his blood pressure and on his prior blood pressure medicine which she cannot identify but tells me it was 25 mg. (I suspect this is  hydrochlorothiazide for which she continues at a dose of 12.5.).  ____________________________________________    Labs: CBC is unremarkable. Metabolic panel shows sodium at 129, potassium at 3.4.   ED ECG REPORT I, Aydan Phoenix W, the attending physician, personally viewed and interpreted this ECG.   Date: 02/11/2015  EKG Time: 1013  Rate: 96  Rhythm: Sinus with 3 PVCs noted on the EKG strip  Axis: Normal  Intervals: normal  ST&T Change: none    ____________________________________________   INITIAL IMPRESSION / ASSESSMENT AND PLAN / ED COURSE  Well-appearing 64 year old male in no acute distress with a fairly normal blood pressure and a normal neurologic exam. I will have him continue his current hydrochlorothiazide. I've asked him to follow-up with his primary Doctor - Dr. Netty Starring.  He reports he is looking for any physician because of some form of a disagreement between him and Dr. Netty Starring.  I will discharge him with a prescription for meclizine. I counseled him to use Tylenol for the benign headache. I've asked him to follow up with his primary physician.  I've also counseled him on the ear wax in left ear. He will use some form of an earwax softener over the next few days and follow-up with his regular physician if he needs further assistance cleaning ear out. Currently with the wax being as hard as it is, I do not think using a curette is appropriate at the moment.  ----------------------------------------- 12:13 PM on 02/11/2015 -----------------------------------------  Patient looked well, his blood pressure was under control. We are going to let him go home but we noted that he had numerous PVCs on his EKG monitor. An EKG was obtained that looked normal other then his 3 beats of PVC spread out over the 12 second EKG. We did blood test which showed a slightly low sodium level.  Patient looks well and will discharge and follow-up with his regular  physician. ____________________________________________   FINAL CLINICAL IMPRESSION(S) / ED DIAGNOSES  Final diagnoses:  Vertigo  Benign headache  Cerumen impaction, left      [name], MD 02/11/15 1000  [name], MD 02/11/15 1213
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
## 2015-02-11 NOTE — ED Notes (Signed)
Pt to ED with c/o his BP being elevated with headache and dizziness, denies any cp, states he has had the headache and dizziness for the last 2 days, currently taking HZTZ but states they are not working for him

## 2015-02-11 NOTE — ED Notes (Signed)
Spoke to Dr. Thomasene Lot about irregular heart rate with frequent PVC's.  Order received.

## 2015-02-18 ENCOUNTER — Encounter: Payer: Self-pay | Admitting: General Practice

## 2015-02-18 ENCOUNTER — Emergency Department
Admission: EM | Admit: 2015-02-18 | Discharge: 2015-02-18 | Disposition: A | Discharge status: Other Acute Inpatient Hospital | Payer: Commercial Managed Care - HMO | Attending: Emergency Medicine | Admitting: Emergency Medicine

## 2015-02-18 ENCOUNTER — Inpatient Hospital Stay
Admit: 2015-02-18 | Discharge: 2015-02-22 | DRG: 885 | Disposition: A | Discharge status: Home / Self Care | Payer: Commercial Managed Care - HMO | Source: Intra-hospital | Attending: Psychiatry | Admitting: Psychiatry

## 2015-02-18 DIAGNOSIS — F329 Major depressive disorder, single episode, unspecified: Secondary | ICD-10-CM | POA: Insufficient documentation

## 2015-02-18 DIAGNOSIS — F339 Major depressive disorder, recurrent, unspecified: Secondary | ICD-10-CM | POA: Diagnosis present

## 2015-02-18 DIAGNOSIS — F323 Major depressive disorder, single episode, severe with psychotic features: Secondary | ICD-10-CM | POA: Diagnosis present

## 2015-02-18 DIAGNOSIS — F1021 Alcohol dependence, in remission: Secondary | ICD-10-CM | POA: Diagnosis present

## 2015-02-18 DIAGNOSIS — Z79899 Other long term (current) drug therapy: Secondary | ICD-10-CM

## 2015-02-18 DIAGNOSIS — F332 Major depressive disorder, recurrent severe without psychotic features: Secondary | ICD-10-CM

## 2015-02-18 DIAGNOSIS — Z7951 Long term (current) use of inhaled steroids: Secondary | ICD-10-CM

## 2015-02-18 DIAGNOSIS — J441 Chronic obstructive pulmonary disease with (acute) exacerbation: Secondary | ICD-10-CM | POA: Diagnosis present

## 2015-02-18 DIAGNOSIS — R45851 Suicidal ideations: Secondary | ICD-10-CM | POA: Diagnosis present

## 2015-02-18 DIAGNOSIS — G2581 Restless legs syndrome: Secondary | ICD-10-CM | POA: Diagnosis present

## 2015-02-18 DIAGNOSIS — K219 Gastro-esophageal reflux disease without esophagitis: Secondary | ICD-10-CM | POA: Diagnosis present

## 2015-02-18 DIAGNOSIS — R440 Auditory hallucinations: Secondary | ICD-10-CM | POA: Diagnosis present

## 2015-02-18 DIAGNOSIS — J45901 Unspecified asthma with (acute) exacerbation: Secondary | ICD-10-CM | POA: Diagnosis present

## 2015-02-18 DIAGNOSIS — F333 Major depressive disorder, recurrent, severe with psychotic symptoms: Secondary | ICD-10-CM | POA: Diagnosis present

## 2015-02-18 DIAGNOSIS — I10 Essential (primary) hypertension: Secondary | ICD-10-CM

## 2015-02-18 DIAGNOSIS — J449 Chronic obstructive pulmonary disease, unspecified: Secondary | ICD-10-CM | POA: Diagnosis present

## 2015-02-18 DIAGNOSIS — F322 Major depressive disorder, single episode, severe without psychotic features: Secondary | ICD-10-CM | POA: Diagnosis not present

## 2015-02-18 LAB — CBC
HCT: 55 % — ABNORMAL HIGH (ref 40.0–52.0)
Hemoglobin: 18.4 g/dL — ABNORMAL HIGH (ref 13.0–18.0)
MCH: 30 pg (ref 26.0–34.0)
MCHC: 33.5 g/dL (ref 32.0–36.0)
MCV: 89.5 fL (ref 80.0–100.0)
Platelets: 245 10*3/uL (ref 150–440)
RBC: 6.15 MIL/uL — AB (ref 4.40–5.90)
RDW: 13.1 % (ref 11.5–14.5)
WBC: 5.8 10*3/uL (ref 3.8–10.6)

## 2015-02-18 LAB — SALICYLATE LEVEL

## 2015-02-18 LAB — URINE DRUG SCREEN, QUALITATIVE (ARMC ONLY)
AMPHETAMINES, UR SCREEN: NOT DETECTED
Barbiturates, Ur Screen: NOT DETECTED
Benzodiazepine, Ur Scrn: NOT DETECTED
Cannabinoid 50 Ng, Ur ~~LOC~~: NOT DETECTED
Cocaine Metabolite,Ur ~~LOC~~: NOT DETECTED
MDMA (Ecstasy)Ur Screen: NOT DETECTED
Methadone Scn, Ur: NOT DETECTED
Opiate, Ur Screen: NOT DETECTED
Phencyclidine (PCP) Ur S: NOT DETECTED
Tricyclic, Ur Screen: NOT DETECTED

## 2015-02-18 LAB — URINALYSIS COMPLETE WITH MICROSCOPIC (ARMC ONLY)
BILIRUBIN URINE: NEGATIVE
Glucose, UA: >500 mg/dL — AB
Hgb urine dipstick: NEGATIVE
Ketones, ur: NEGATIVE mg/dL
LEUKOCYTES UA: NEGATIVE
NITRITE: NEGATIVE
PH: 5 (ref 5.0–8.0)
PROTEIN: NEGATIVE mg/dL
Specific Gravity, Urine: 1.021 (ref 1.005–1.030)

## 2015-02-18 LAB — LIPID PANEL
CHOL/HDL RATIO: 5 ratio
CHOLESTEROL: 175 mg/dL (ref 0–200)
HDL: 35 mg/dL — ABNORMAL LOW (ref >40)
LDL Cholesterol: 111 mg/dL — ABNORMAL HIGH (ref 0–99)
Triglycerides: 145 mg/dL (ref <150)
VLDL: 29 mg/dL (ref 0–40)

## 2015-02-18 LAB — COMPREHENSIVE METABOLIC PANEL
ALK PHOS: 47 U/L (ref 38–126)
ALT: 74 U/L — ABNORMAL HIGH (ref 17–63)
ANION GAP: 9 (ref 5–15)
AST: 41 U/L (ref 15–41)
Albumin: 4.1 g/dL (ref 3.5–5.0)
BUN: 14 mg/dL (ref 6–20)
CO2: 22 mmol/L (ref 22–32)
CREATININE: 0.92 mg/dL (ref 0.61–1.24)
Calcium: 9 mg/dL (ref 8.9–10.3)
Chloride: 105 mmol/L (ref 101–111)
GFR calc non Af Amer: >60 mL/min (ref >60)
Glucose, Bld: 120 mg/dL — ABNORMAL HIGH (ref 65–99)
Potassium: 3.4 mmol/L — ABNORMAL LOW (ref 3.5–5.1)
Sodium: 136 mmol/L (ref 135–145)
Total Bilirubin: 0.9 mg/dL (ref 0.3–1.2)
Total Protein: 7.8 g/dL (ref 6.5–8.1)

## 2015-02-18 LAB — ACETAMINOPHEN LEVEL: Acetaminophen (Tylenol), Serum: <10 ug/mL — ABNORMAL LOW (ref 10–30)

## 2015-02-18 LAB — ETHANOL: Alcohol, Ethyl (B): 8 mg/dL — ABNORMAL HIGH (ref <5)

## 2015-02-18 MED ORDER — ACETAMINOPHEN 325 MG PO TABS: 650.0000 mg | ORAL_TABLET | Freq: Four times a day (QID) | ORAL | Status: DC | PRN

## 2015-02-18 MED ORDER — PANTOPRAZOLE SODIUM 40 MG PO TBEC
40.0000 mg | DELAYED_RELEASE_TABLET | Freq: Two times a day (BID) | ORAL | Status: DC
  Administered 2015-02-18 – 2015-02-22 (×8): 40 mg via ORAL
  Filled 2015-02-18 (×8): qty 1

## 2015-02-18 MED ORDER — ATENOLOL 25 MG PO TABS: 25.0000 mg | ORAL_TABLET | Freq: Two times a day (BID) | ORAL | Status: DC

## 2015-02-18 MED ORDER — VENLAFAXINE HCL ER 75 MG PO CP24
150.0000 mg | ORAL_CAPSULE | Freq: Every day | ORAL | Status: DC
  Administered 2015-02-19 – 2015-02-22 (×4): 150 mg via ORAL
  Filled 2015-02-18 (×4): qty 2

## 2015-02-18 MED ORDER — MAGNESIUM HYDROXIDE 400 MG/5ML PO SUSP
30.0000 mL | Freq: Every day | ORAL | Status: DC | PRN
Start: 2015-02-18 — End: 2015-02-22

## 2015-02-18 MED ORDER — ARIPIPRAZOLE 10 MG PO TABS
20.0000 mg | ORAL_TABLET | Freq: Every day | ORAL | Status: DC
  Administered 2015-02-18 – 2015-02-22 (×5): 20 mg via ORAL
  Filled 2015-02-18 (×5): qty 2

## 2015-02-18 MED ORDER — ALBUTEROL SULFATE (2.5 MG/3ML) 0.083% IN NEBU: 2.5000 mg | INHALATION_SOLUTION | RESPIRATORY_TRACT | Status: DC | PRN

## 2015-02-18 MED ORDER — ATENOLOL 25 MG PO TABS
25.0000 mg | ORAL_TABLET | Freq: Every day | ORAL | Status: DC
  Administered 2015-02-18 – 2015-02-22 (×5): 25 mg via ORAL
  Filled 2015-02-18 (×5): qty 1

## 2015-02-18 MED ORDER — MIRTAZAPINE 30 MG PO TABS
45.0000 mg | ORAL_TABLET | Freq: Every day | ORAL | Status: DC
  Administered 2015-02-18 – 2015-02-21 (×4): 45 mg via ORAL
  Filled 2015-02-18 (×4): qty 1

## 2015-02-18 MED ORDER — ROPINIROLE HCL 1 MG PO TABS
3.0000 mg | ORAL_TABLET | Freq: Every day | ORAL | Status: DC
  Administered 2015-02-18 – 2015-02-21 (×4): 3 mg via ORAL
  Filled 2015-02-18 (×6): qty 3

## 2015-02-18 MED ORDER — ALUM & MAG HYDROXIDE-SIMETH 200-200-20 MG/5ML PO SUSP: 30.0000 mL | ORAL | Status: DC | PRN

## 2015-02-18 NOTE — ED Notes (Signed)
BEHAVIORAL HEALTH ROUNDING Patient sleeping: No. Patient alert and oriented: yes Behavior appropriate: Yes.  ; If no, describe:  Nutrition and fluids offered: Yes  Toileting and hygiene offered: Yes  Sitter present: no Law enforcement present: Yes  

## 2015-02-18 NOTE — ED Notes (Signed)
Dr quale at bedside. 

## 2015-02-18 NOTE — ED Notes (Signed)
Pt. Arrived to ed from home with reports of depression over the last 3-4 days. Pt continues to state "i just feel awful" Pt verbalized SI over the last few days. When asked about specific plan pt states "i am just going to get in my truck". Pt alert and oriented x 3. Denies HI.
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
## 2015-02-18 NOTE — Tx Team (Signed)
Initial Interdisciplinary Treatment Plan   PATIENT STRESSORS: Health problems Medication change or noncompliance   PATIENT STRENGTHS: Capable of independent living Communication skills   PROBLEM LIST: Problem List/Patient Goals Date to be addressed Date deferred Reason deferred Estimated date of resolution  depression      Suicide ideation with plan                                                 DISCHARGE CRITERIA:  Improved stabilization in mood, thinking, and/or behavior Need for constant or close observation no longer present  PRELIMINARY DISCHARGE PLAN: Outpatient therapy Return to previous living arrangement  PATIENT/FAMIILY INVOLVEMENT: This treatment plan has been presented to and reviewed with the patient, MARCELLUS PULLIAM, and/or family member. The patient and family have been given the opportunity to ask questions and make suggestions.  Cathey Fredenburg Talbot Grumbling 02/18/2015, 6:38 PM

## 2015-02-18 NOTE — Progress Notes (Signed)
Admitted from ED, a 64 year old male with a diagnosis of depression. Patient reports recent worsening of mood and suicidal thoughts with a plan to "wreck my truck." Patient is alert and oriented x 4. His mood is depressed with congruent affect. + SI, denies HI. No evidence of psychotic thinking. Speech is coherent, thoughts are organized and linear.  See admission database for complete clinical data.  Patient was cooperative with all nursing interventions. Search completed, no contraband found. Verifying nurse is Meredith Mody, Therapist, sports.
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
## 2015-02-18 NOTE — BH Assessment (Signed)
Assessment Note  Mathew Aguilar is an 64 y.o. male, presents to the ED stating, "I got real depressed; I can't eat; I want to drive my truck until the end; to crash my truck; to tear it up with me in it; I'm tired; I have been sober for (2) months; I don't feel like living no more."   Axis I: Alcohol Abuse and Major Depression, Recurrent severe Axis II: Deferred Axis III:  Past Medical History  Diagnosis Date  . Hypertension   . Depression    Axis IV: other psychosocial or environmental problems and problems with primary support group Axis V: 11-20 some danger of hurting self or others possible OR occasionally fails to maintain minimal personal hygiene OR gross impairment in communication  Past Medical History:  Past Medical History  Diagnosis Date  . Hypertension   . Depression     History reviewed. No pertinent past surgical history.  Family History: No family history on file.  Social History:  reports that he has never smoked. He does not have any smokeless tobacco history on file. He reports that he does not drink alcohol. His drug history is not on file.  Additional Social History:     CIWA: CIWA-Ar BP: (!) 154/72 mmHg Pulse Rate: (!) 35 COWS:    Allergies: No Known Allergies  Home Medications:  (Not in a hospital admission)  OB/GYN Status:  No LMP for male patient.  General Assessment Data Location of Assessment: [facility] ED TTS Assessment: In system Is this a Tele or Face-to-Face Assessment?: Face-to-Face Is this an Initial Assessment or a Re-assessment for this encounter?: Re-Assessment Marital status: Single Maiden name: none Is patient pregnant?: No Pregnancy Status: No Living Arrangements: Alone Can pt return to current living arrangement?: Yes Admission Status: Voluntary Is patient capable of signing voluntary admission?: Yes Referral Source: Self/Family/Friend Insurance type: Medicare/Medicaid  Medical Screening Exam ([place]) Medical  Exam completed: Yes  Crisis Care Plan Living Arrangements: Alone Name of Psychiatrist: none Name of Therapist: none  Education Status Is patient currently in school?: No Current Grade: n/a Highest grade of school patient has completed: 10th Name of school: n/a Contact person: none  Risk to self with the past 6 months Suicidal Ideation: Yes-Currently Present Has patient been a risk to self within the past 6 months prior to admission? : Yes Suicidal Intent: Yes-Currently Present Has patient had any suicidal intent within the past 6 months prior to admission? : Yes Is patient at risk for suicide?: Yes Suicidal Plan?: Yes-Currently Present ("to crash my truck.") Has patient had any suicidal plan within the past 6 months prior to admission? : Yes Specify Current Suicidal Plan: "to drive my truck until i crash it." Access to Means: Yes Specify Access to Suicidal Means: "my truck" What has been your use of drugs/alcohol within the last 12 months?: alcohol; current BAC: .008 Previous Attempts/Gestures: Yes How many times?: 3 Other Self Harm Risks: "I'm really depressed." Triggers for Past Attempts: Other personal contacts, Unpredictable Intentional Self Injurious Behavior: None Family Suicide History: No Recent stressful life event(s): Conflict (Comment), Other (Comment) (being alone) Persecutory voices/beliefs?: No Depression: Yes Depression Symptoms: Despondent, Tearfulness, Isolating ("I can't eat.") Substance abuse history and/or treatment for substance abuse?: Yes Suicide prevention information given to non-admitted patients: Yes  Risk to Others within the past 6 months Homicidal Ideation: No Does patient have any lifetime risk of violence toward others beyond the six months prior to admission? : No Thoughts of Harm to  Others: No Current Homicidal Intent: No Current Homicidal Plan: No Access to Homicidal Means: No Identified Victim: none History of harm to others?:  No Assessment of Violence: On admission Violent Behavior Description: none Does patient have access to weapons?: No Criminal Charges Pending?: No Does patient have a court date: No Is patient on probation?: No  Psychosis Hallucinations: None noted Delusions: None noted  Mental Status Report Appearance/Hygiene: Unremarkable, In scrubs Eye Contact: Fair Motor Activity: Unremarkable Speech: Soft, Slow Level of Consciousness: Quiet/awake, Alert, Crying Mood: Sad, Depressed Affect: Depressed, Sad Anxiety Level: Minimal Thought Processes: Coherent, Relevant Judgement: Partial Orientation: Person, Place, Situation Obsessive Compulsive Thoughts/Behaviors: Moderate  Cognitive Functioning Concentration: Decreased Memory: Recent Intact, Remote Intact IQ: Average Insight: Fair Impulse Control: Fair Appetite: Poor Weight Loss: 0 Weight Gain: 0 Sleep: Decreased Total Hours of Sleep: 2 Vegetative Symptoms: None  ADLScreening [facility] Assessment Services) Patient's cognitive ability adequate to safely complete daily activities?: Yes Patient able to express need for assistance with ADLs?: Yes Independently performs ADLs?: Yes (appropriate for developmental age)  Prior Inpatient Therapy Prior Inpatient Therapy: Yes Prior Therapy Dates: h/o several admissions Prior Therapy Facilty/Provider(s): [facility] Reason for Treatment: depression; suicidal thoughts  Prior Outpatient Therapy Prior Outpatient Therapy: No Does patient have an ACCT team?: No Does patient have Intensive In-House Services?  : No Does patient have [facility] services? : No Does patient have P4CC services?: No  ADL Screening (condition at time of admission) Patient's cognitive ability adequate to safely complete daily activities?: Yes Patient able to express need for assistance with ADLs?: Yes Independently performs ADLs?: Yes (appropriate for developmental age)       Abuse/Neglect Assessment (Assessment to be  complete while patient is alone) Physical Abuse: Denies Verbal Abuse: Denies Sexual Abuse: Denies Exploitation of patient/patient's resources: Denies Self-Neglect: Denies Values / Beliefs Cultural Requests During Hospitalization: None Spiritual Requests During Hospitalization: None Consults Spiritual Care Consult Needed: No Social Work Consult Needed: No Regulatory affairs officer (For Healthcare) Does patient have an advance directive?: No Would patient like information on creating an advanced directive?: Yes Higher education careers adviser given    Additional Information 1:1 In Past 12 Months?: No CIRT Risk: No Elopement Risk: No Does patient have medical clearance?: Yes  Child/Adolescent Assessment Running Away Risk: Denies Bed-Wetting: Denies Destruction of Property: Denies Cruelty to Animals: Denies Stealing: Denies Rebellious/Defies Authority: Denies Satanic Involvement: Denies Science writer: Denies Problems at [employer]: Denies Gang Involvement: Denies  Disposition:  Disposition Initial Assessment Completed for this Encounter: Yes Disposition of Patient: Inpatient treatment program Type of inpatient treatment program: Adult  On Site Evaluation by:   Reviewed with Physician:    Maris Berger 02/18/2015 2:51 PM

## 2015-02-18 NOTE — ED Notes (Signed)
MD Claypacks at bedside at this time

## 2015-02-18 NOTE — ED Notes (Signed)
Patient assigned to appropriate care area. Patient oriented to unit/care area: Informed that, for their safety, care areas are designed for safety and monitored by security cameras at all times; and visiting hours explained to patient. Patient verbalizes understanding, and verbal contract for safety obtained. 

## 2015-02-18 NOTE — ED Notes (Signed)
BEHAVIORAL HEALTH ROUNDING Patient sleeping: No. Patient alert and oriented: yes Behavior appropriate: Yes.  ;  Nutrition and fluids offered: Yes  Toileting and hygiene offered: Yes  Sitter present: yes Law enforcement present: Yes  

## 2015-02-18 NOTE — ED Provider Notes (Signed)
-----------------------------------------   5:12 PM on 02/18/2015 -----------------------------------------  BP 154/72 mmHg  Pulse 35  Temp(Src) 98.2 F (36.8 C) (Oral)  Resp 18  Ht 5' (1.524 m)  Wt 170 lb (77.111 kg)  BMI 33.20 kg/m2  SpO2 96%   Patient admitted to [facility] for behavioral health treatment.  Ponciano Ort, MD 02/18/15 [phone]

## 2015-02-18 NOTE — ED Notes (Signed)

## 2015-02-19 ENCOUNTER — Encounter: Payer: Self-pay | Admitting: Psychiatry

## 2015-02-19 DIAGNOSIS — K219 Gastro-esophageal reflux disease without esophagitis: Secondary | ICD-10-CM | POA: Diagnosis present

## 2015-02-19 DIAGNOSIS — F1021 Alcohol dependence, in remission: Secondary | ICD-10-CM

## 2015-02-19 DIAGNOSIS — I10 Essential (primary) hypertension: Secondary | ICD-10-CM

## 2015-02-19 DIAGNOSIS — F332 Major depressive disorder, recurrent severe without psychotic features: Secondary | ICD-10-CM

## 2015-02-19 DIAGNOSIS — F322 Major depressive disorder, single episode, severe without psychotic features: Secondary | ICD-10-CM

## 2015-02-19 NOTE — Progress Notes (Signed)
D: Pt denies SI/HI/AVH. Pt is pleasant and cooperative. Pt continues to be depressed and stated he feels he wants to get better, pt stated he was feeling very depressed and couldn't eat or sleep.   A: Pt was offered support and encouragement. Pt was given scheduled medications. Pt was encourage to attend groups. Q 15 minute checks were done for safety.   R:Pt attends groups and interacts well with peers and staff. Pt is taking medication. Pt receptive to treatment and safety maintained on unit.

## 2015-02-19 NOTE — BHH Group Notes (Signed)
[facility] Group Notes:  (Nursing/MHT/Case Management/Adjunct)  Date:  02/19/2015  Time:  1:33 AM  Type of Therapy:  Group Therapy  Participation Level:  Active  Participation Quality:  Appropriate  Affect:  Appropriate  Cognitive:  Appropriate  Insight:  Appropriate  Engagement in Group:  Engaged  Modes of Intervention:  n/a  Summary of Progress/Problems:  Mathew Aguilar 02/19/2015, 1:33 AM
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
## 2015-02-19 NOTE — BHH Suicide Risk Assessment (Signed)
[facility] Admission Suicide Risk Assessment   Nursing information obtained from:  Patient Demographic factors:  Male, Living alone Current Mental Status:  Suicide plan Loss Factors:  NA Historical Factors:  Prior suicide attempts Risk Reduction Factors:  Positive therapeutic relationship Total Time spent with patient: 1 hour Principal Problem: <principal problem not specified> Diagnosis:   Patient Active Problem List   Diagnosis Date Noted  . Major depression [F32.2] 02/18/2015  . Alcohol dependence in early full remission [F10.21] 02/18/2015  . High blood pressure [I10] 02/18/2015  . Restless legs syndrome [G25.81] 02/18/2015  . 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  World Pharmacologist [facility]). Score between 0-7:  no or low risk or alcohol related problems. Score between 8-15:  moderate risk of alcohol related problems. Score between 16-19:  high risk of alcohol related problems. Score 20 or above:  warrants further diagnostic evaluation for alcohol dependence and treatment.   CLINICAL FACTORS:   Depression:   Severe   Musculoskeletal: Strength & Muscle Tone: within normal limits Gait & Station: normal Patient leans: N/A  Psychiatric Specialty Exam: Physical Exam  Vitals reviewed.   Review of Systems  Unable to perform ROS All other systems reviewed and are negative.   Blood pressure 129/79, pulse 61, temperature 98.1 F (36.7 C), temperature source Oral, resp. rate 20, height 5' (1.524 m), weight 76.204 kg (168 lb), SpO2 99 %.Body mass index is 32.81 kg/(m^2).  General Appearance: Casual  Eye Contact::  Fair  Speech:  Clear and Coherent  Volume:  Normal  Mood:  Depressed  Affect:  Tearful  Thought Process:  Goal Directed  Orientation:  Full (Time,  Place, and Person)  Thought Content:  Hallucinations: Auditory  Suicidal Thoughts:  Yes  Homicidal Thoughts:  No  Memory:  Immediate;   Fair Recent;   Fair Remote;   Fair  Judgement:  Fair  Insight:  Fair  Psychomotor Activity:  Normal  Concentration:  Fair  Recall:  [employer] [place]  Language: Fair  Akathisia:  No  Handed:  Right  AIMS (if indicated):     Assets:  Communication Skills Desire for Improvement Financial Resources/Insurance Housing Resilience Social Support  Sleep:  Number of Hours: 4  Cognition: WNL  ADL's:  Intact     COGNITIVE FEATURES THAT CONTRIBUTE TO RISK:  None    SUICIDE RISK:   Severe:  Frequent, intense, and enduring suicidal ideation, specific plan, no subjective intent, but some objective markers of intent (i.e., choice of lethal method), the method is accessible, some limited preparatory behavior, evidence of impaired self-control, severe dysphoria/symptomatology, multiple risk factors present, and few if any protective factors, particularly a lack of social support.  PLAN OF CARE: Hospital admission, medication management, discharge planning.  Medical Decision Making:  New problem, with additional work up planned, Review of Psycho-Social Stressors (1), Review or order clinical lab tests (1) and Review of New Medication or Change in Dosage (2)   Mathew Aguilar is a 65 year old male with history of severe depression admitted for suicidal ideation and vague auditory hallucinations.  1. Suicidal ideation. The patient is able to contract for safety in the hospital.  2. Mood. He has been maintained in the community on a combination of Abilify, Remeron, and Effexor for depression and psychosis. We will continue all his medications for  now. He is in the care of Dr. Zollie Scale and [facility] ACT team.  3. COPD. We'll continue inhalers.  4. Hypertension. We will continue. Atenolol.  5 GERD. We'll continue pantoprazole.  6. Restless legs. We'll  continue Requip.  7. Disposition. He will be discharged to home. Follow-up with her act team  .   I certify that inpatient services furnished can reasonably be expected to improve the patient's condition.   Mathew Aguilar 02/19/2015, 12:09 PM She is she said she is

## 2015-02-19 NOTE — BHH Suicide Risk Assessment (Signed)
[place] INPATIENT:  Family/Significant Other Suicide Prevention Education  Suicide Prevention Education:  Patient Refusal for Family/Significant Other Suicide Prevention Education: The patient Mathew Aguilar has refused to provide written consent for family/significant other to be provided Family/Significant Other Suicide Prevention Education during admission and/or prior to discharge.  Physician notified.  [place], [place], Carmela Hurt D 02/19/2015, 11:46 AM

## 2015-02-19 NOTE — BHH Group Notes (Signed)
[facility] Group Notes:  (Nursing/MHT/Case Management/Adjunct)  Date:  02/19/2015  Time:  2:08 PM  Type of Therapy:  Group Therapy  Participation Level:  Did Not Attend    Mathew Aguilar 02/19/2015, 2:08 PM

## 2015-02-19 NOTE — BHH Counselor (Addendum)
Adult Comprehensive Assessment  Patient ID: HEAVEN WANDELL, male   DOB: 02/19/1951, 64 y.o.   MRN: [id number]  Information Source: Information source: Patient  Current Stressors:  Substance abuse: history of alcohol abuse  Living/Environment/Situation:  Living Arrangements: Alone Living conditions (as described by patient or guardian): good How long has patient lived in current situation?: 4 years What is atmosphere in current home: Comfortable  Family History:  Marital status: Divorced Divorced, when?: 2005 What types of issues is patient dealing with in the relationship?: n/a Does patient have children?: Yes How many children?: 1 How is patient's relationship with their children?: close  Childhood History:  By whom was/is the patient raised?: Both parents Description of patient's relationship with caregiver when they were a child: good relationship Does patient have siblings?: Yes Number of Siblings: 3 Description of patient's current relationship with siblings: close Did patient suffer from severe childhood neglect?: No Has patient ever been sexually abused/assaulted/raped as an adolescent or adult?: No Was the patient ever a victim of a crime or a disaster?: No Witnessed domestic violence?: No Has patient been effected by domestic violence as an adult?: No  Education:  Highest grade of school patient has completed: 7th Currently a student?: No Learning disability?: Yes What learning problems does patient have?: learning challenges  Employment/Work Situation:   Employment situation: On disability Why is patient on disability: medical How long has patient been on disability: since 1993 What is the longest time patient has a held a job?: 11 yrs Where was the patient employed at that time?: [place] patient ever been in the [employer]?: No Has patient ever served in Recruitment consultant?: No  Financial Resources:   Museum/gallery curator resources: Teacher, early years/pre, [employer],  Medicare Does patient have a Programmer, applications or guardian?: No  Alcohol/Substance Abuse:   What has been your use of drugs/alcohol within the last 12 months?: daily use of 3-4 beer until last hospitalization If attempted suicide, did drugs/alcohol play a role in this?: No Alcohol/Substance Abuse Treatment Hx: Past detox If yes, describe treatment: during past admissions Has alcohol/substance abuse ever caused legal problems?: No  Social Support System:   Pensions consultant Support System: Fair Astronomer System: neighbors, sister, son Type of faith/religion: Darrick Meigs How does patient's faith help to cope with current illness?: none  Leisure/Recreation:   Leisure and Hobbies: talking to neighbors  Strengths/Needs:   What things does the patient do well?: keeping home clean In what areas does patient struggle / problems for patient: depresssion, worrying about inspection  Discharge Plan:   Does patient have access to transportation?: Yes Will patient be returning to same living situation after discharge?: Yes Currently receiving community mental health services: Yes (From Whom) ([facility], [facility], [facility]) Does patient have financial barriers related to discharge medications?: No  Summary/Recommendations:   Pt is a 64 y/o divorced male who current lives alone in an apartment.  Pt was admitted due to worsening depression and SI.  Pt denies current SI.  CSW provided suicide prevention education to pt.  Pt has a history of depression and previous hospitalizations.  Pt has social supports and has an ACTT at [employer].  Pt would like for [place] with his ACTT to come visit him.  Pt will continue services with [facility] post discharge.  Pt is encouraged to participate in treatment process, group therapy, medication management and discharge planning.    [place], Floris [place], Carmela Hurt D. 02/19/2015

## 2015-02-19 NOTE — Progress Notes (Signed)
Patient pleasant on approach. States he has been feeling more depressed than usual and felt he needed to come into the hospital. Presently he denies SI/HI/AVH. Affect is flat and mood sad. No acute distress.

## 2015-02-19 NOTE — Progress Notes (Signed)
Recreation Therapy Notes  Date: 06.07.16 Time: 3:00 pm Location: Craft Room  Group Topic: Self-expression  Goal Area(s) Addresses:  Patient will identify one color per emotion listed on wheel. Patient will verbalize benefit of using art as a means of self-expression. Patient will verbalize one emotion experienced during session. Patient will be educated on other forms of self-expression.  Behavioral Response: Attentive, Interactive  Intervention: Emotion Wheel  Activity: Patients were given a worksheet with 7 emotions and instructed to pick a color for each emotion.   Education: LRT educated patients on different forms on self-expression.  Education Outcome: Acknowledges education/In group clarification offered  Clinical Observations/Feedback: Patient picked one color for one emotion on worksheet. Patient contributed to group discussion by stating which color he picked for one emotion.   Leonette Monarch, LRT/CTRS 02/19/2015 4:09 PM

## 2015-02-19 NOTE — H&P (Signed)
Psychiatric Admission Assessment Adult  Patient Identification: Mathew Aguilar MRN:  [id number] Date of Evaluation:  02/19/2015 Chief Complaint:  Major Depression Principal Diagnosis: <principal problem not specified> Diagnosis:   Patient Active Problem List   Diagnosis Date Noted  . Major depression [F32.2] 02/18/2015  . Alcohol dependence in early full remission [F10.21] 02/18/2015  . High blood pressure [I10] 02/18/2015  . Restless legs syndrome [G25.81] 02/18/2015  . 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 Mathew Aguilar is a 64 year old male with history of severe depression.   Chief complaint. "I got suicidal."  History of present illness. Mathew Aguilar has a long history of mental illness, he has been hospitalized multiple times at our hospital. Last time in March. It seems that he returns every 2-3 months for worsening of depression sometimes psychotic symptoms. He reports poor sleep, decreased appetite, anhedonia, crying spells, being of hopelessness worthlessness and guilt, poor management energy and concentration not able to get out of bed or do anything around the house, heightened anxiety, social isolation, and now suicidal ideation without a plan. He denies command auditory hallucinations but hears his name being called. He did not try to hurt himself and came to the hospital instead. He did not use his active or any other resources in the community to prevent this hospitalization. He reports good compliance with medications. There are no new stressors that would help [place] understand worsening of his depression. There are no new health issues. He denies alcohol or illicit substance or prescription pills abuse. He denies symptoms suggestive of bipolar mania.   Past psychiatric history. Multiple admissions at to psychiatry for worsening of depression and suicidal ideation and sometimes psychosis. He has been tried on  numerous medications but feels that the combination of Abilify and Remeron works were for him.   family psychiatric history. Nonreported.    social history. He lives independently in a subsidized apartment. He works with acting. He is disabled from mental illness. 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 He used to have girlfriends all the time but is not dating presently.  Past medical history. GERD, hypertension, COPD, restless leg syndrome.       Total Time spent with patient: 1 hour  Past Medical History:  Past Medical History  Diagnosis Date  . Hypertension   . Depression    History reviewed. No pertinent past surgical history. Family History:  Family History  Problem Relation Age of Onset  . Family history unknown: Yes   Social History:  History  Alcohol Use No    Comment: history of ETOH abuse - sober several months     History  Drug Use No    History   Social History  . Marital Status: Single    Spouse Name: N/A  . Number of Children: N/A  . Years of Education: N/A   Social History Main Topics  . Smoking status: Never Smoker   . Smokeless tobacco: Never Used  . Alcohol Use: No     Comment: history of ETOH abuse - sober several months  . Drug Use: No  . Sexual Activity: Not Currently    Birth Control/ Protection: None   Other Topics Concern  . None   Social History Narrative   Additional Social History:    Pain Medications: N/A Prescriptions: N/A Over the Counter: N/A  History of alcohol / drug use?: Yes Longest period of sobriety (when/how long):  (66 days) Negative Consequences of Use: Personal relationships Withdrawal Symptoms: Tremors Name of Substance 1:  (alcohol) 1 - Age of First Use:  (59 years) 1 - Amount (size/oz):  (6 pack beer ) 1 - Frequency:  (5 x week) 1 - Duration:  (4 years) 1 - Last Use / Amount:  (66 days PTA)                    Musculoskeletal: Strength & Muscle Tone: within normal limits Gait & Station: normal Patient leans: N/A  Psychiatric Specialty Exam: I review findings in the physical exam performed in the emergency room and concur with the findings.  Physical Exam  Nursing note and vitals reviewed.   Review of Systems  All other systems reviewed and are negative.   Blood pressure 129/79, pulse 61, temperature 98.1 F (36.7 C), temperature source Oral, resp. rate 20, height 5' (1.524 m), weight 76.204 kg (168 lb), SpO2 99 %.Body mass index is 32.81 kg/(m^2).  See SRA.                                                  Sleep:  Number of Hours: 4   Risk to Self: Is patient at risk for suicide?: Yes What has been your use of drugs/alcohol within the last 12 months?: daily use of 3-4 beer until last hospitalization Risk to Others:   Prior Inpatient Therapy:   Prior Outpatient Therapy:    Alcohol Screening: 1. How often do you have a drink containing alcohol?: Never 9. Have you or someone else been injured as a result of your drinking?: No 10. Has a relative or friend or a doctor or another health worker been concerned about your drinking or suggested you cut down?: No Alcohol Use Disorder Identification Test Final Score (AUDIT): 0 Brief Intervention: Yes  Allergies:  No Known Allergies Lab Results:  Results for orders placed or performed during the hospital encounter of 02/18/15 (from the past 48 hour(s))  Lipid panel, fasting     Status: Abnormal   Collection Time: 02/18/15  8:57 AM  Result Value Ref Range   Cholesterol 175 0 - 200 mg/dL   Triglycerides 145 <150 mg/dL   HDL 35 (L) >40 mg/dL   Total CHOL/HDL Ratio 5.0 RATIO   VLDL 29 0 - 40 mg/dL   LDL Cholesterol 111 (H) 0 - 99 mg/dL    Comment:        Total Cholesterol/HDL:CHD Risk Coronary Heart Disease Risk Table                     Men   Women  1/2 Average Risk   3.4   3.3  Average Risk       5.0    4.4  2 X Average Risk   9.6   7.1  3 X Average Risk  23.4   11.0        Use the calculated Patient Ratio above and the CHD Risk Table to determine the patient's CHD Risk.        ATP III CLASSIFICATION (LDL):  <100     mg/dL   Optimal  100-129  mg/dL   Near or Above  Optimal  130-159  mg/dL   Borderline  160-189  mg/dL   High  >190     mg/dL   Very High    Current Medications: Current Facility-Administered Medications  Medication Dose Route Frequency Provider Last Rate Last Dose  . acetaminophen (TYLENOL) tablet 650 mg  650 mg Oral Q6H PRN Gonzella Lex, MD      . albuterol (PROVENTIL) (2.5 MG/3ML) 0.083% nebulizer solution 2.5 mg  2.5 mg Inhalation Q4H PRN Gonzella Lex, MD      . alum & mag hydroxide-simeth (MAALOX/MYLANTA) 200-200-20 MG/5ML suspension 30 mL  30 mL Oral Q4H PRN Gonzella Lex, MD      . ARIPiprazole (ABILIFY) tablet 20 mg  20 mg Oral Daily Gonzella Lex, MD   20 mg at 02/19/15 0957  . atenolol (TENORMIN) tablet 25 mg  25 mg Oral Daily Gonzella Lex, MD   25 mg at 02/19/15 0957  . magnesium hydroxide (MILK OF MAGNESIA) suspension 30 mL  30 mL Oral Daily PRN Gonzella Lex, MD      . mirtazapine (REMERON) tablet 45 mg  45 mg Oral QHS Gonzella Lex, MD   45 mg at 02/18/15 2202  . pantoprazole (PROTONIX) EC tablet 40 mg  40 mg Oral BID Gonzella Lex, MD   40 mg at 02/19/15 0957  . rOPINIRole (REQUIP) tablet 3 mg  3 mg Oral QHS Gonzella Lex, MD   3 mg at 02/18/15 2209  . venlafaxine XR (EFFEXOR-XR) 24 hr capsule 150 mg  150 mg Oral Q breakfast Gonzella Lex, MD   150 mg at 02/19/15 0957   PTA Medications: Prescriptions prior to admission  Medication Sig Dispense Refill Last Dose  . fluticasone (FLONASE) 50 MCG/ACT nasal spray Place 2 sprays into both nostrils daily as needed for rhinitis.    02/18/2015 at Unknown time  . hydrochlorothiazide (HYDRODIURIL) 12.5 MG tablet Take 12.5 mg by mouth daily.   02/18/2015 at Unknown time  . meclizine  (ANTIVERT) 12.5 MG tablet Take 1 tablet (12.5 mg total) by mouth 3 (three) times daily as needed for dizziness or nausea. 15 tablet 1 02/18/2015 at Unknown time    Previous Psychotropic Medications: Yes   Substance Abuse History in the last 12 months:  No.    Consequences of Substance Abuse: NA  Results for orders placed or performed during the hospital encounter of 02/18/15 (from the past 72 hour(s))  Lipid panel, fasting     Status: Abnormal   Collection Time: 02/18/15  8:57 AM  Result Value Ref Range   Cholesterol 175 0 - 200 mg/dL   Triglycerides 145 <150 mg/dL   HDL 35 (L) >40 mg/dL   Total CHOL/HDL Ratio 5.0 RATIO   VLDL 29 0 - 40 mg/dL   LDL Cholesterol 111 (H) 0 - 99 mg/dL    Comment:        Total Cholesterol/HDL:CHD Risk Coronary Heart Disease Risk Table                     Men   Women  1/2 Average Risk   3.4   3.3  Average Risk       5.0   4.4  2 X Average Risk   9.6   7.1  3 X Average Risk  23.4   11.0        Use the calculated Patient Ratio above and the CHD Risk Table to determine the patient's CHD  Risk.        ATP III CLASSIFICATION (LDL):  <100     mg/dL   Optimal  100-129  mg/dL   Near or Above                    Optimal  130-159  mg/dL   Borderline  160-189  mg/dL   High  >190     mg/dL   Very High     Observation Level/Precautions:  15 minute checks  Laboratory:  CBC Chemistry Profile UDS UA  Psychotherapy:    Medications:    Consultations:    Discharge Concerns:    Estimated LOS:  Other:     Psychological Evaluations: No   Treatment Plan Summary: Daily contact with patient to assess and evaluate symptoms and progress in treatment and Medication management  Medical Decision Making:  New problem, with additional work up planned, Review of Psycho-Social Stressors (1), Review or order clinical lab tests (1) and Review of Medication Regimen & Side Effects (2)   Mathew Aguilar is a 64 year old male with history of severe depression admitted for  suicidal ideation and vague auditory hallucinations.  1. Suicidal ideation. The patient is able to contract for safety in the hospital.  2. Mood. He has been maintained in the community on a combination of Abilify, Remeron, and Effexor for depression and psychosis. We will continue all his medications for now. He is in the care of Dr. Zollie Scale and [facility] ACT team.  3. COPD. We'll continue inhalers.  4. Hypertension. We will continue. Atenolol.  5 GERD. We'll continue pantoprazole.  6. Restless legs. We'll continue Requip.  7. Disposition. He will be discharged to home. Follow-up with her act team  I certify that inpatient services furnished can reasonably be expected to improve the patient's condition.   Jaime Dome 6/7/201612:16 PM

## 2015-02-19 NOTE — BHH Group Notes (Signed)
[facility] Group Notes:  (Nursing/MHT/Case Management/Adjunct)  Date:  02/19/2015  Time:  11:03 PM  Type of Therapy:  Group Therapy  Participation Level:  Active  Participation Quality:  Appropriate  Affect:  Appropriate  Cognitive:  Appropriate  Insight:  Appropriate  Engagement in Group:  Engaged  Modes of Intervention:  Discussion  Summary of Progress/Problems:  Mathew Aguilar 02/19/2015, 11:03 PM

## 2015-02-20 NOTE — Tx Team (Signed)
Interdisciplinary Treatment Plan Update (Adult)  Date:  02/20/2015 Time Reviewed:  9:21 AM  Progress in Treatment: Attending groups: Yes. Participating in groups:  Yes. Taking medication as prescribed:  Yes. Tolerating medication:  Yes. Family/Significant othe contact made:  No, will contact:  if patient gives consent Patient understands diagnosis:  Yes. Discussing patient identified problems/goals with staff:  Yes. Medical problems stabilized or resolved:  Yes. Denies suicidal/homicidal ideation: No. and As evidenced by:  patient's report of SI Issues/concerns per patient self-inventory:  No. Other:  New problem(s) identified: No, Describe:  none reported  Discharge Plan or Barriers: Patient has an apartment he can return to and is connected with [facility]  Reason for Continuation of Hospitalization: Depression Suicidal ideation  Comments:  Estimated length of stay: up to 2 days  New goal(s):  Review of initial/current patient goals per problem list:   See Care Plan  Attendees: Patient:   6/8/20169:21 AM  Family:   6/8/20169:21 AM  Physician:  Orson Slick, MD 6/8/20169:21 AM  Nursing:    6/8/20169:21 AM  Case Manager:   6/8/20169:21 AM  Counselor:   6/8/20169:21 AM  Other:  Carmell Austria, LCSWA 6/8/20169:21 AM  Other:  Dossie Arbour, LCSW 6/8/20169:21 AM  Other:  Everitt Amber, LRT 6/8/20169:21 AM  Other:  6/8/20169:21 AM  Other:  6/8/20169:21 AM  Other:  6/8/20169:21 AM  Other:  6/8/20169:21 AM  Other:  6/8/20169:21 AM  Other:  6/8/20169:21 AM  Other:   6/8/20169:21 AM   Scribe for Treatment Team:   Carmell Austria T, 02/20/2015, 9:21 AM

## 2015-02-20 NOTE — Progress Notes (Signed)
[place] LCSW Group Therapy  02/20/2015 2:48 PM  Type of Therapy:  Group Therapy  Participation Level:  Active  Participation Quality:  Appropriate  Affect:  Appropriate  Cognitive:  Appropriate  Insight:  Developing/Improving  Engagement in Therapy:  Engaged  Modes of Intervention:  Discussion, Education, Exploration and Support  Summary of Progress/Problems:Todays group topic was Finding Balance in Life. Patient was able to relate what a good day looks like when he is balanced and what steps to take to ensure she can maintain balance in life when he discharges from hospital. Good support to peers    Mathew Aguilar 02/20/2015, 2:48 PM

## 2015-02-20 NOTE — Progress Notes (Signed)
Patient continues to have flat, sad affect and mood. He denies SI. Attending some groups. Continue to monitor.

## 2015-02-20 NOTE — Plan of Care (Signed)
Problem: Ineffective individual coping Goal: LTG: Patient will report a decrease in negative feelings Outcome: Not Progressing Patient states he is not feeling any better yet, even though he is not having SI.

## 2015-02-20 NOTE — Plan of Care (Signed)
Problem: [facility] Participation in Recreation Therapeutic Interventions Goal: STG-Patient will demonstrate improved self esteem by identif STG: Self-Esteem - Within 4 treatment sessions, patient will verbalize at least 5 positive affirmation statements in each of 2 treatment sessions to increase self-esteem post d/c.  Outcome: Progressing Treatment Session 1; Completed 1 out of 2: At approximately 11:40 am, LRT met with patient in patient room. Patient verbalized 5 positive affirmation statements. Patient reported it felt "good". LRT encouraged patient to continue saying positive affirmation statements.  Leonette Monarch, LRT/CTRS 06.08.16 2:36 pm

## 2015-02-20 NOTE — Progress Notes (Signed)
[facility] LCSW Group Therapy  02/20/2015 7:29 AM  Type of Therapy:  Group Therapy  Participation Level:  Active  Participation Quality:  Attentive  Affect:  Appropriate  Cognitive:  Appropriate  Insight:  Engaged  Engagement in Therapy:  Improving  Modes of Intervention:  Discussion, Education, Exploration and Support  Summary of Progress/Problems:Group therapy today's discussion was emotional regulation. Patients were asked to identify negative emotions and ways to cope with those emotions. This patient was able to share personal experiences and support peers in discussion.  Enis Slipper M 02/20/2015, 7:29 AM

## 2015-02-20 NOTE — Progress Notes (Signed)
Recreation Therapy Notes  Date: 06.08.16 Time: 3:00 pm Location: Craft Room  Group Topic: Self-esteem  Goal Area(s) Addresses:  Patient will write down at least one positive trait about self. Patient will verbalize how it felt to see positive traits on paper.  Behavioral Response: Attentive  Intervention: I Am  Activity: Patients were given a worksheet with the letter I on it and instructed to list as many positive things inside the letter as they can.  Education: LRT educated patient on ways to increase self-esteem   Education Outcome: Acknowledges education/In group clarification offered  Clinical Observations/Feedback: Patient did not participate in group activity. Patient did not contribute to group discussion.  Leonette Monarch, LRT/CTRS 02/20/2015 4:26 PM

## 2015-02-20 NOTE — Progress Notes (Signed)
D: Patient denies SI/HI/AVH.  Patient affect is flat and his mood is depressed.  Patient did attend evening group. Patient visible on the milieu. No distress noted. A: Support and encouragement offered. Scheduled medications given to pt. Q 15 min checks continued for patient safety. R: Patient receptive. Patient remains safe on the unit.

## 2015-02-20 NOTE — Progress Notes (Signed)
[facility] MD Progress Note  02/20/2015 5:46 PM Mathew Aguilar  MRN:  [id number]  Subjective:  Mathew Aguilar reports slight improvement. He slept well last night and feels rested. He has been able to participate in all groups. Sleep and appetite are okay. No longer he hears his voice called. He still is unable to contract for safety in the community but mood is improving and affect is brighter. He has no somatic complaints. He tolerates medications well.  Principal Problem: Recurrent major depression-severe Diagnosis:   Patient Active Problem List   Diagnosis Date Noted  . COPD exacerbation [J44.1] 02/19/2015  . GERD (gastroesophageal reflux disease) [K21.9] 02/19/2015  . Restless leg syndrome [G25.81] 02/19/2015  . Major depression [F32.2] 02/18/2015  . Alcohol dependence in early full remission [F10.21] 02/18/2015  . High blood pressure [I10] 02/18/2015  . Restless legs syndrome [G25.81] 02/18/2015  . 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 Hypertension   . Depression    History reviewed. No pertinent past surgical history. Family History:  Family History  Problem Relation Age of Onset  . Family history unknown: Yes   Social History:  History  Alcohol Use No    Comment: history of ETOH abuse - sober several months     History  Drug Use No    History   Social History  . Marital Status: Single    Spouse Name: N/A  . Number of Children: N/A  . Years of Education: N/A   Social History Main Topics  . Smoking status: Never Smoker   . Smokeless tobacco: Never Used  . Alcohol Use: No     Comment: history of ETOH abuse - sober several months  . Drug Use: No  . Sexual Activity: Not Currently    Birth Control/ Protection: None   Other Topics Concern  . None   Social History Narrative   Additional History:     Sleep: Good  Appetite:  Good   Assessment:   Musculoskeletal: Strength & Muscle Tone: within normal limits Gait & Station: normal Patient leans: N/A   Psychiatric Specialty Exam: Physical Exam  Nursing note and vitals reviewed.   Review of Systems  All other systems reviewed and are negative.   Blood pressure 154/90, pulse 59, temperature 98.2 F (36.8 C), temperature source Oral, resp. rate 20, height 5' (1.524 m), weight 76.204 kg (168 lb), SpO2 99 %.Body mass index is 32.81 kg/(m^2).  General Appearance: Casual  Eye Contact::  Good  Speech:  Normal Rate  Volume:  Normal  Mood:  Depressed  Affect:  Flat  Thought Process:  Goal Directed  Orientation:  Full (Time, Place, and Person)  Thought Content:  Hallucinations: Auditory  Suicidal Thoughts:  Yes.  without intent/plan  Homicidal Thoughts:  No  Memory:  Immediate;   Fair Recent;   Fair Remote;   Fair  Judgement:  Fair  Insight:  Fair  Psychomotor Activity:  Normal  Concentration:  Fair  Recall:  [employer] [place]  Language: Fair  Akathisia:  No  Handed:  Right  AIMS (if indicated):     Assets:  Communication Skills Desire for Improvement Financial Resources/Insurance Housing Physical Health Social Support  ADL's:  Intact  Cognition: WNL  Sleep:  Number of Hours: 6.25     Current Medications: Current Facility-Administered Medications  Medication Dose Route  Frequency Provider Last Rate Last Dose  . acetaminophen (TYLENOL) tablet 650 mg  650 mg Oral Q6H PRN Gonzella Lex, MD      . albuterol (PROVENTIL) (2.5 MG/3ML) 0.083% nebulizer solution 2.5 mg  2.5 mg Inhalation Q4H PRN Gonzella Lex, MD      . alum & mag hydroxide-simeth (MAALOX/MYLANTA) 200-200-20 MG/5ML suspension 30 mL  30 mL Oral Q4H PRN Gonzella Lex, MD      . ARIPiprazole (ABILIFY) tablet 20 mg  20 mg Oral Daily Gonzella Lex, MD   20 mg at 02/20/15 0939  . atenolol (TENORMIN) tablet 25 mg  25 mg Oral Daily Gonzella Lex, MD   25 mg at 02/20/15 0939  . magnesium hydroxide (MILK OF MAGNESIA) suspension 30 mL  30 mL Oral Daily PRN Gonzella Lex, MD      . mirtazapine (REMERON) tablet 45 mg  45 mg Oral QHS Gonzella Lex, MD   45 mg at 02/19/15 2145  . pantoprazole (PROTONIX) EC tablet 40 mg  40 mg Oral BID Gonzella Lex, MD   40 mg at 02/20/15 0939  . rOPINIRole (REQUIP) tablet 3 mg  3 mg Oral QHS Gonzella Lex, MD   3 mg at 02/19/15 2145  . venlafaxine XR (EFFEXOR-XR) 24 hr capsule 150 mg  150 mg Oral Q breakfast Gonzella Lex, MD   150 mg at 02/20/15 5462    Lab Results: No results found for this or any previous visit (from the past 48 hour(s)).  Physical Findings: AIMS:  , ,  ,  , Dental Status Current problems with teeth and/or dentures?: No Does patient usually wear dentures?: No  CIWA:  CIWA-Ar Total: 0 COWS:     Treatment Plan Summary: Daily contact with patient to assess and evaluate symptoms and progress in treatment and Medication management   Medical Decision Making:  Established Problem, Stable/Improving (1), Review of Psycho-Social Stressors (1), Review or order clinical lab tests (1), Review of Medication Regimen & Side Effects (2) and Review of New Medication or Change in Dosage (2)   Mathew Aguilar is a 64 year old male with history of severe depression admitted for suicidal ideation and vague auditory hallucinations.  1. Suicidal ideation. The patient is able to contract for safety in the hospital.  2. Mood. He has been maintained in the community on a combination of Abilify, Remeron, and Effexor for depression and psychosis. We will continue all his medications for now. He is in the care of Dr. Zollie Aguilar and [facility] team.  3. COPD. We'll continue inhalers.  4. Hypertension. We will continue. Atenolol.  5 GERD. We'll continue pantoprazole.  6. Restless legs. We'll continue Requip.  7. Disposition. He will be discharged to home. Follow-up with her act team      Mathew Aguilar 02/20/2015, 5:46 PM

## 2015-02-20 NOTE — Progress Notes (Signed)
[facility] LCSW Aftercare Discharge Planning Group Note  02/20/2015 10:24 AM  Participation Quality:  Appropriate  Affect:  Appropriate  Cognitive:  Appropriate  Insight:  Developing/Improving  Engagement in Group:  Developing/Improving  Modes of Intervention:  Discussion, Education and Support  Summary of Progress/Problems: Patients goal for today to meet with SW and psychiatrist  Enis Slipper M 02/20/2015, 10:24 AM

## 2015-02-20 NOTE — [facility] Group Notes (Signed)
[facility] Group Notes:  (Nursing/MHT/Case Management/Adjunct)  Date:  02/20/2015  Time:  11:49 AM  Type of Therapy:  Group Therapy  Participation Level:  Active  Participation Quality:  Appropriate  Affect:  Appropriate  Cognitive:  Appropriate  Insight:  Appropriate  Engagement in Group:  Engaged  Modes of Intervention:  Activity  Summary of Progress/Problems:  Drake Leach 02/20/2015, 11:49 AM

## 2015-02-21 DIAGNOSIS — F333 Major depressive disorder, recurrent, severe with psychotic symptoms: Secondary | ICD-10-CM | POA: Diagnosis present

## 2015-02-21 NOTE — Progress Notes (Signed)
Patient continues to endorse multiple sx of depression. Affect anxious and mood is sad. He attends groups and reports that interacting with others is helpful to him. Denies SI/HI/AVH. Continue to monitor.

## 2015-02-21 NOTE — Plan of Care (Signed)
Problem: [facility] Participation in Recreation Therapeutic Interventions Goal: STG-Patient will demonstrate improved self esteem by identif STG: Self-Esteem - Within 4 treatment sessions, patient will verbalize at least 5 positive affirmation statements in each of 2 treatment sessions to increase self-esteem post d/c.  Outcome: Completed/Met Date Met:  02/21/15 Treatment Session 2; Completed 2 out of 2: At approximately 9:20 am, LRT met with patient in patient room. Patient verbalized 5 positive affirmation statements. Patient reported it felt "good". LRT encouraged patient to continue saying positive affirmation statements.   M , LRT/CTRS 06.09.16 12:44 pm     

## 2015-02-21 NOTE — Plan of Care (Signed)
Problem: Ineffective individual coping Goal: STG: Pt will be able to identify effective and ineffective STG: Pt will be able to identify effective and ineffective coping patterns  Outcome: Progressing States that talking to other people is therapeutic to him.

## 2015-02-21 NOTE — Progress Notes (Signed)
Recreation Therapy Notes  Date: 06.09.16 Time: 3:00 pm Location: Craft Room  Group Topic: Leisure Education  Goal Area(s) Addresses:  Patient will identify activities for each letter of the alphabet. Patient will verbalize ability to integrate positive leisure into life post d/c. Patient will verbalize ability to use leisure as a Technical sales engineer.  Behavioral Response: Attentive  Intervention: Leisure Alphabet  Activity: Patients were given a worksheet with the alphabet on it and instructed to list healthy leisure activities for each letter of the alphabet.  Education: LRT educated patient on what is needed to participate in leisure  Education Outcome: Acknowledges education/In group clarification offered   Clinical Observations/Feedback: Patient did not participate in group activity. Patient did not contribute to group discussion.  Leonette Monarch, LRT/CTRS 02/21/2015 4:21 PM

## 2015-02-21 NOTE — Progress Notes (Signed)
D: Patient denies SI/HI/AVH. Patient affect is flat and his mood is depressed.  Patient did attend evening group. Patient visible on the milieu. No distress noted. A: Support and encouragement offered. Scheduled medications given to pt. Q 15 min checks continued for patient safety. R: Patient receptive. Patient remains safe on the unit.

## 2015-02-21 NOTE — Progress Notes (Signed)
[facility] MD Progress Note  02/21/2015 3:01 PM MICHA DOSANJH  MRN:  [id number]  Subjective: Mr. Brannan continues to improve. Mood is improving, affect is brighter. He denies suicidal ideation. He tolerates medications well. There is good group participation. Anticipated discharge tomorrow.  Principal Problem: Recurrent major depression-severe Diagnosis:   Patient Active Problem List   Diagnosis Date Noted  . 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 COPD exacerbation [J44.1] 02/19/2015  . GERD (gastroesophageal reflux disease) [K21.9] 02/19/2015  . Restless leg syndrome [G25.81] 02/19/2015  . Major depression [F32.2] 02/18/2015  . Alcohol dependence in early full remission [F10.21] 02/18/2015  . High blood pressure [I10] 02/18/2015  . Restless legs syndrome [G25.81] 02/18/2015   Total Time spent with patient: 20 minutes   Past Medical History:  Past Medical History  Diagnosis Date  . Hypertension   . Depression    History reviewed. No pertinent past surgical history. Family History:  Family History  Problem Relation Age of Onset  . Family history unknown: Yes   Social History:  History  Alcohol Use No    Comment: history of ETOH abuse - sober several months     History  Drug Use No    History   Social History  . Marital Status: Single    Spouse Name: N/A  . Number of Children: N/A  . Years of Education: N/A   Social History Main Topics  . Smoking status: Never Smoker   . Smokeless tobacco: Never Used  . Alcohol Use: No     Comment: history of ETOH abuse - sober several months  . Drug Use: No  . Sexual Activity: Not Currently    Birth Control/ Protection: None   Other Topics Concern  . None   Social History Narrative   Additional History:    Sleep: Good  Appetite:  Good   Assessment:   Musculoskeletal: Strength & Muscle Tone: within normal limits Gait & Station: normal Patient  leans: N/A   Psychiatric Specialty Exam: Physical Exam  Nursing note and vitals reviewed.   Review of Systems  All other systems reviewed and are negative.   Blood pressure 146/91, pulse 62, temperature 98.3 F (36.8 C), temperature source Oral, resp. rate 20, height 5' (1.524 m), weight 76.204 kg (168 lb), SpO2 99 %.Body mass index is 32.81 kg/(m^2).  General Appearance: Casual  Eye Contact::  Good  Speech:  Clear and Coherent  Volume:  Normal  Mood:  Depressed  Affect:  Appropriate  Thought Process:  Goal Directed and Linear  Orientation:  Full (Time, Place, and Person)  Thought Content:  WDL  Suicidal Thoughts:  No  Homicidal Thoughts:  No  Memory:  Immediate;   Fair Recent;   Fair Remote;   Fair  Judgement:  Fair  Insight:  Fair  Psychomotor Activity:  Normal  Concentration:  Fair  Recall:  [employer] [place]  Language: Fair  Akathisia:  No  Handed:  Right  AIMS (if indicated):     Assets:  Communication Skills Desire for Improvement Financial Resources/Insurance Housing Physical Health  ADL's:  Intact  Cognition: WNL  Sleep:  Number of Hours: 5.3     Current Medications: Current Facility-Administered Medications  Medication Dose Route Frequency Provider Last Rate Last Dose  . acetaminophen (TYLENOL) tablet 650 mg  650 mg Oral Q6H PRN Gonzella Lex, MD      . albuterol (PROVENTIL) (2.5 MG/3ML) 0.083%  nebulizer solution 2.5 mg  2.5 mg Inhalation Q4H PRN Gonzella Lex, MD      . alum & mag hydroxide-simeth (MAALOX/MYLANTA) 200-200-20 MG/5ML suspension 30 mL  30 mL Oral Q4H PRN Gonzella Lex, MD      . ARIPiprazole (ABILIFY) tablet 20 mg  20 mg Oral Daily Gonzella Lex, MD   20 mg at 02/21/15 0923  . atenolol (TENORMIN) tablet 25 mg  25 mg Oral Daily Gonzella Lex, MD   25 mg at 02/21/15 9476  . magnesium hydroxide (MILK OF MAGNESIA) suspension 30 mL  30 mL Oral Daily PRN Gonzella Lex, MD      . mirtazapine (REMERON) tablet 45 mg  45 mg Oral  QHS Gonzella Lex, MD   45 mg at 02/20/15 2132  . pantoprazole (PROTONIX) EC tablet 40 mg  40 mg Oral BID Gonzella Lex, MD   40 mg at 02/21/15 5465  . rOPINIRole (REQUIP) tablet 3 mg  3 mg Oral QHS Gonzella Lex, MD   3 mg at 02/20/15 2132  . venlafaxine XR (EFFEXOR-XR) 24 hr capsule 150 mg  150 mg Oral Q breakfast Gonzella Lex, MD   150 mg at 02/21/15 0354    Lab Results: No results found for this or any previous visit (from the past 48 hour(s)).  Physical Findings: AIMS:  , ,  ,  , Dental Status Current problems with teeth and/or dentures?: No Does patient usually wear dentures?: No  CIWA:  CIWA-Ar Total: 0 COWS:     Treatment Plan Summary: Daily contact with patient to assess and evaluate symptoms and progress in treatment and Medication management   Medical Decision Making:  Established Problem, Stable/Improving (1), Review of Psycho-Social Stressors (1), Review or order clinical lab tests (1), Review of Medication Regimen & Side Effects (2) and Review of New Medication or Change in Dosage (2)   Mr. Seyfried is a 64 year old male with history of severe depression admitted for suicidal ideation and vague auditory hallucinations.  1. Suicidal ideation. The patient is able to contract for safety in the hospital.  2. Mood. He has been maintained in the community on a combination of Abilify, Remeron, and Effexor for depression and psychosis. We will continue all his medications for now. He is in the care of Dr. Zollie Scale and [facility] team.  3. COPD. We'll continue inhalers.  4. Hypertension. We will continue. Atenolol.  5 GERD. We'll continue pantoprazole.  6. Restless legs. We'll continue Requip.  7. Disposition. He will be discharged to home. Follow-up with her act team     Liridona Mashaw 02/21/2015, 3:01 PM

## 2015-02-21 NOTE — [facility] Group Notes (Signed)
[facility] Group Notes:  (Nursing/MHT/Case Management/Adjunct)  Date:  02/21/2015  Time:  2:50 PM  Type of Therapy:  Group Therapy  Participation Level:  Did Not Attend  Summary of Progress/Problems:  Mathew Aguilar Mathew Aguilar Mathew Aguilar 02/21/2015, 2:50 PM

## 2015-02-21 NOTE — B[facility] Group Notes (Signed)
[name] Group Therapy  02/21/2015 12:55 PM  Type of Therapy:  Group Therapy  Participation Level:  Active  Participation Quality:  Attentive  Affect:  Appropriate  Cognitive:  Appropriate  Insight:  Developing/Improving  Engagement in Therapy:  Developing/Improving  Modes of Intervention:  Discussion, Education, Exploration and Support  Summary of Progress/Problems:Group Therapy  was centered on suicide prevention and intervention and resources in the community several handouts provided to patient. Patient was able to share and follow discussion and reflected on personal experiences. Pt was able to be supportive of peers.  Mathew Aguilar M 02/21/2015, 12:55 PM

## 2015-02-22 ENCOUNTER — Other Ambulatory Visit: Payer: Self-pay | Admitting: Psychiatry

## 2015-02-22 MED ORDER — PANTOPRAZOLE SODIUM 40 MG PO TBEC: 40.0000 mg | DELAYED_RELEASE_TABLET | Freq: Two times a day (BID) | ORAL | Status: DC

## 2015-02-22 MED ORDER — ALBUTEROL SULFATE (2.5 MG/3ML) 0.083% IN NEBU: 2.5000 mg | INHALATION_SOLUTION | RESPIRATORY_TRACT | Status: DC | PRN

## 2015-02-22 MED ORDER — MIRTAZAPINE 45 MG PO TABS: 45.0000 mg | ORAL_TABLET | Freq: Every day | ORAL | Status: DC

## 2015-02-22 MED ORDER — ROPINIROLE HCL 3 MG PO TABS: 3.0000 mg | ORAL_TABLET | Freq: Every day | ORAL | Status: DC

## 2015-02-22 MED ORDER — VENLAFAXINE HCL ER 150 MG PO CP24: 150.0000 mg | ORAL_CAPSULE | Freq: Every day | ORAL | Status: DC

## 2015-02-22 MED ORDER — ATENOLOL 25 MG PO TABS: 25.0000 mg | ORAL_TABLET | Freq: Every day | ORAL | Status: DC

## 2015-02-22 MED ORDER — ARIPIPRAZOLE 20 MG PO TABS: 20.0000 mg | ORAL_TABLET | Freq: Every day | ORAL | Status: DC

## 2015-02-22 NOTE — Progress Notes (Signed)
D: Patient denies SI/HI/AVH. Patient affect is flat. Mood is depressed.  Patient did attend evening group. Patient visible on the milieu. No distress noted. A: Support and encouragement offered. Scheduled medications given to pt. Q 15 min checks continued for patient safety. R: Patient receptive. Patient remains safe on the unit.

## 2015-02-22 NOTE — B[facility] Suicide Risk Assessment (Signed)
[facility] INPATIENT:  Family/Significant Other Suicide Prevention Education  Suicide Prevention Education:  Patient Refusal for Family/Significant Other Suicide Prevention Education: The patient Mathew Aguilar has refused to provide written consent for family/significant other to be provided Family/Significant Other Suicide Prevention Education during admission and/or prior to discharge.  Physician notified.  Carmell Austria T 02/22/2015, 11:35 AM

## 2015-02-22 NOTE — Progress Notes (Signed)
Recreation Therapy Notes  INPATIENT RECREATION TR PLAN  Patient Details Name: [name] MRN: [id number] DOB: 1951-01-17 Today's Date: 02/22/2015  Rec Therapy Plan Is patient appropriate for Therapeutic Recreation?: Yes Treatment times per week: At least 3 times a week TR Treatment/Interventions: 1:1 session, Group participation (Comment) (Appropriate participation in daily recreation therapy tx)  Discharge Criteria Pt will be discharged from therapy if:: Discharged Treatment plan/goals/alternatives discussed and agreed upon by:: Patient/family  Discharge Summary Short term goals set: See Care Plan Short term goals met: Complete Progress toward goals comments: One-to-one attended Which groups?: Leisure education, Self-esteem, Other (Comment) (Slef-expression) One-to-one attended: Self-esteem Reason goals not met: N/A Therapeutic equipment acquired: None Reason patient discharged from therapy: Discharge from hospital Pt/family agrees with progress & goals achieved: Yes Date patient discharged from therapy: 02/22/15   Leonette Monarch, LRT/CTRS 02/22/2015, 1:37 PM

## 2015-02-22 NOTE — B[facility] Group Notes (Signed)
[facility] LCSW Aftercare Discharge Planning Group Note  02/22/2015 10:04 AM  Participation Quality:  Appropriate and Attentive  Affect:  Appropriate  Cognitive:  Alert, Appropriate and Oriented  Insight:  Engaged  Engagement in Group:  Engaged  Modes of Intervention:  Education, Socialization and Support  Summary of Progress/Problems: Patient attended and participated appropriately in group discussion. Patient shared that his SMART goal is to "feel better and not sleep so much in the day time".   Carmell Austria T 02/22/2015, 10:04 AM

## 2015-02-22 NOTE — Progress Notes (Signed)
  [facility] Adult Case Management Discharge Plan :  Will you be returning to the same living situation after discharge:  Yes,  returning to patient's apartment At discharge, do you have transportation home?: Yes,  patient has his truck in the hospital parking lot to drive himself home Do you have the ability to pay for your medications: Yes,  patient has insurance  Release of information consent forms completed and in the chart;  Patient's signature needed at discharge.  Patient to Follow up at: Follow-up Information    Follow up with [facility].   Why:  For follow-up care; ACT Team will followup with patient Monday 02/28/15 if not sooner.    Contact information:   [address] [place], [place] Ph [phone] Fax [phone]       Patient denies SI/HI: Yes,  patient denies SI/HI    Safety Planning and Suicide Prevention discussed: Yes,  SPE discussed with patient and patient is given SPE brochure  Have you used any form of tobacco in the last 30 days? (Cigarettes, Smokeless Tobacco, Cigars, and/or Pipes): No  Has patient been referred to the Quitline?: N/A patient is not a smoker  Carmell Austria T 02/22/2015, 11:35 AM

## 2015-02-22 NOTE — Progress Notes (Signed)
Pt discharged home. DC instructions provided and explained. Medications reviewed. Rx given. All questions answered. Denies SI, HI, AVH. Pt stable at discharge

## 2015-02-22 NOTE — Progress Notes (Signed)
AVS H&P Discharge Summary faxed to [facility] for hospital follow-up

## 2015-02-22 NOTE — BHH Suicide Risk Assessment (Signed)
[facility] Discharge Suicide Risk Assessment   Demographic Factors:  Male and Caucasian  Total Time spent with patient: 30 minutes  Musculoskeletal: Strength & Muscle Tone: within normal limits Gait & Station: normal Patient leans: N/A  Psychiatric Specialty Exam: Physical Exam  Nursing note and vitals reviewed.   Review of Systems  All other systems reviewed and are negative.   Blood pressure 162/97, pulse 52, temperature 98.5 F (36.9 C), temperature source Oral, resp. rate 20, height 5' (1.524 m), weight 76.204 kg (168 lb), SpO2 99 %.Body mass index is 32.81 kg/(m^2).  General Appearance: Casual  Eye Contact::  Good  Speech:  Clear and [id number]  Volume:  Normal  Mood:  Euthymic  Affect:  Appropriate  Thought Process:  Goal Directed and Logical  Orientation:  Full (Time, Place, and Person)  Thought Content:  WDL  Suicidal Thoughts:  No  Homicidal Thoughts:  No  Memory:  Immediate;   Fair Recent;   Fair Remote;   Fair  Judgement:  Fair  Insight:  Fair  Psychomotor Activity:  Normal  Concentration:  Fair  Recall:  [employer]  Language: Fair  Akathisia:  No  Handed:  Right  AIMS (if indicated):     Assets:  Communication Skills Desire for Improvement Financial Resources/Insurance Housing Physical Health Resilience Social Support  Sleep:  Number of Hours: 7  Cognition: WNL  ADL's:  Intact   Have you used any form of tobacco in the last 30 days? (Cigarettes, Smokeless Tobacco, Cigars, and/or Pipes): No  Has this patient used any form of tobacco in the last 30 days? (Cigarettes, Smokeless Tobacco, Cigars, and/or Pipes) No  Mental Status Per Nursing Assessment::   On Admission:  Suicide plan  Current Mental Status by Physician: NA  Loss Factors: NA  Historical Factors: NA  Risk Reduction Factors:   Positive social support  Continued Clinical Symptoms:  Depression:   Severe  Cognitive Features That Contribute To Risk:  None     Suicide Risk:  Minimal: No identifiable suicidal ideation.  Patients presenting with no risk factors but with morbid ruminations; may be classified as minimal risk based on the severity of the depressive symptoms  Principal Problem: Severe recurrent major depressive disorder with psychotic features Discharge Diagnoses:  Patient Active Problem List   Diagnosis Date Noted  . Severe recurrent major depressive disorder with psychotic features [F33.3] 02/21/2015  . COPD exacerbation [J44.1] 02/19/2015  . GERD (gastroesophageal reflux disease) [K21.9] 02/19/2015  . Restless leg syndrome [G25.81] 02/19/2015  . Major depression [F32.2] 02/18/2015  . Alcohol dependence in early full remission [F10.21] 02/18/2015  . High blood pressure [I10] 02/18/2015  . Restless legs syndrome [G25.81] 02/18/2015    Follow-up Information    Follow up with [facility].   Why:  For follow-up care   Contact information:   [address] [place], [place] Ph [place] Fax [phone]       Plan Of Care/Follow-up recommendations:  Activity:  As tolerated Diet:  Low sodium heart healthy Other:  Keep follow-up appointments  Is patient on multiple antipsychotic therapies at discharge:  No   Has Patient had three or more failed trials of antipsychotic monotherapy by history:  No  Recommended Plan for Multiple Antipsychotic Therapies: NA    Jolanta Pucilowska 02/22/2015, 11:00 AM

## 2015-02-22 NOTE — Discharge Summary (Signed)
Physician Discharge Summary Note  Patient:  Mathew Aguilar is an 64 y.o., male MRN:  [id number] DOB:  1951/01/01 Patient phone:  [phone] (home)  Patient address:   [address] [place] [address],  Total Time spent with patient: 30 minutes  Date of Admission:  02/18/2015 Date of Discharge: 02/22/2015  Reason for Admission:  Worsening of depression and auditory hallucinations.  Identifying data. Mathew Aguilar is a 64 year old male with history of severe depression.   Chief complaint. "I got suicidal."  History of present illness. Mathew Aguilar has a long history of mental illness, he has been hospitalized multiple times at our hospital. Last time in March. It seems that he returns every 2-3 months for worsening of depression sometimes psychotic symptoms. He reports poor sleep, decreased appetite, anhedonia, crying spells, being of hopelessness worthlessness and guilt, poor management energy and concentration not able to get out of bed or do anything around the house, heightened anxiety, social isolation, and now suicidal ideation without a plan. He denies command auditory hallucinations but hears his name being called. He did not try to hurt himself and came to the hospital instead. He did not use his active or any other resources in the community to prevent this hospitalization. He reports good compliance with medications. There are no new stressors that would help [place] understand worsening of his depression. There are no new health issues. He denies alcohol or illicit substance or prescription pills abuse. He denies symptoms suggestive of bipolar mania.   Past psychiatric history. Multiple admissions at to psychiatry for worsening of depression and suicidal ideation and sometimes psychosis. He has been tried on numerous medications but feels that the combination of Abilify and Remeron works were for him.  family psychiatric history. Nonreported.   social history. He  lives independently in a subsidized apartment. He works with acting. He is disabled from mental illness. 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 He used to have girlfriends all the time but is not dating presently.  Past medical history. GERD, hypertension, COPD, restless leg syndrome.    Principal Problem: Severe recurrent major depressive disorder with psychotic features Discharge Diagnoses: Patient Active Problem List   Diagnosis Date Noted  . Severe recurrent major depressive disorder with psychotic features [F33.3] 02/21/2015  . COPD exacerbation [J44.1] 02/19/2015  . GERD (gastroesophageal reflux disease) [K21.9] 02/19/2015  . Restless leg syndrome [G25.81] 02/19/2015  . Major depression [F32.2] 02/18/2015  . Alcohol dependence in early full remission [F10.21] 02/18/2015  . High blood pressure [I10] 02/18/2015  . Restless legs syndrome [G25.81] 02/18/2015    Musculoskeletal: Strength & Muscle Tone: within normal limits Gait & Station: normal Patient leans: N/A  Psychiatric Specialty Exam: Physical Exam  Nursing note and vitals reviewed.   Review of Systems  All other systems reviewed and are negative.   Blood pressure 162/97, pulse 52, temperature 98.5 F (36.9 C), temperature source Oral, resp. rate 20, height 5' (1.524 m), weight 76.204 kg (168 lb), SpO2 99 %.Body mass index is 32.81 kg/(m^2).  See SRA.                                                  Sleep:  Number of Hours:  7   Have you used any form of tobacco in the last 30 days? (Cigarettes, Smokeless Tobacco, Cigars, and/or Pipes): No  Has this patient used any form of tobacco in the last 30 days? (Cigarettes, Smokeless Tobacco, Cigars, and/or Pipes) No  Past Medical History:  Past Medical History  Diagnosis Date  . Hypertension   . Depression    History reviewed. No pertinent past  surgical history. Family History:  Family History  Problem Relation Age of Onset  . Family history unknown: Yes   Social History:  History  Alcohol Use No    Comment: history of ETOH abuse - sober several months     History  Drug Use No    History   Social History  . Marital Status: Single    Spouse Name: N/A  . Number of Children: N/A  . Years of Education: N/A   Social History Main Topics  . Smoking status: Never Smoker   . Smokeless tobacco: Never Used  . Alcohol Use: No     Comment: history of ETOH abuse - sober several months  . Drug Use: No  . Sexual Activity: Not Currently    Birth Control/ Protection: None   Other Topics Concern  . None   Social History Narrative    Past Psychiatric History: Hospitalizations:  Outpatient Care:  Substance Abuse Care:  Self-Mutilation:  Suicidal Attempts:  Violent Behaviors:   Risk to Self: Is patient at risk for suicide?: Yes What has been your use of drugs/alcohol within the last 12 months?: daily use of 3-4 beer until last hospitalization Risk to Others:   Prior Inpatient Therapy:   Prior Outpatient Therapy:    Level of Care:  OP   Hospital Course:    Mathew Aguilar is a 64 year old male with a history of severe depression admitted for suicidal ideation and vague auditory hallucinations.  1. Suicidal ideation. This has resolved.The patient is able to contract for safety  2. Mood. He has been maintained in the community on a combination of Abilify, Remeron, and Effexor for depression and psychosis. We continued all his medications.   3. COPD. We continued inhalers.  4. Hypertension. We continued. Atenolol.  5 GERD. We continued pantoprazole.  6. Restless legs. We continued Requip.  7. Disposition. He was discharged to home. Follow-up with [facility] team    Consults:  None  Significant Diagnostic Studies:  None  Discharge Vitals:   Blood pressure 162/97, pulse 52, temperature 98.5 F (36.9 C),  temperature source Oral, resp. rate 20, height 5' (1.524 m), weight 76.204 kg (168 lb), SpO2 99 %. Body mass index is 32.81 kg/(m^2). Lab Results:   No results found for this or any previous visit (from the past 72 hour(s)).  Physical Findings: AIMS:  , ,  ,  , Dental Status Current problems with teeth and/or dentures?: No Does patient usually wear dentures?: No  CIWA:  CIWA-Ar Total: 0 COWS:      See Psychiatric Specialty Exam and Suicide Risk Assessment completed by Attending Physician prior to discharge.  Discharge destination:  Home  Is patient on multiple antipsychotic therapies at discharge:  No   Has Patient had three or more failed trials of antipsychotic monotherapy by history:  No    Recommended Plan for Multiple Antipsychotic Therapies: NA  Discharge Instructions    Diet - low sodium heart healthy    Complete by:  As directed      Increase activity slowly    Complete by:  As directed             Medication List    TAKE these medications      Indication   albuterol (2.5 MG/3ML) 0.083% nebulizer solution  Commonly known as:  PROVENTIL  Inhale 3 mLs (2.5 mg total) into the lungs every 4 (four) hours as needed for wheezing or shortness of breath.      ARIPiprazole 20 MG tablet  Commonly known as:  ABILIFY  Take 1 tablet (20 mg total) by mouth daily.   Indication:  Major Depressive Disorder     atenolol 25 MG tablet  Commonly known as:  TENORMIN  Take 1 tablet (25 mg total) by mouth daily.   Indication:  High Blood Pressure     fluticasone 50 MCG/ACT nasal spray  Commonly known as:  FLONASE  Place 2 sprays into both nostrils daily as needed for rhinitis.      hydrochlorothiazide 12.5 MG tablet  Commonly known as:  HYDRODIURIL  Take 12.5 mg by mouth daily.      meclizine 12.5 MG tablet  Commonly known as:  ANTIVERT  Take 1 tablet (12.5 mg total) by mouth 3 (three) times daily as needed for dizziness or nausea.      mirtazapine 45 MG tablet   Commonly known as:  REMERON  Take 1 tablet (45 mg total) by mouth at bedtime.   Indication:  Major Depressive Disorder     pantoprazole 40 MG tablet  Commonly known as:  PROTONIX  Take 1 tablet (40 mg total) by mouth 2 (two) times daily.   Indication:  Gastroesophageal Reflux Disease     rOPINIRole 3 MG tablet  Commonly known as:  REQUIP  Take 1 tablet (3 mg total) by mouth at bedtime.   Indication:  Restless Leg Syndrome     venlafaxine XR 150 MG 24 hr capsule  Commonly known as:  EFFEXOR-XR  Take 1 capsule (150 mg total) by mouth daily with breakfast.   Indication:  Major Depressive Disorder           Follow-up Information    Follow up with [facility].   Why:  For follow-up care   Contact information:   [address] [place], [place] Ph [phone] Fax [phone]       Follow-up recommendations:  Activity:  As tolerated. Diet:  Low sodium heart healthy Other:  Keep follow-up appointments.  Comments:    Total Discharge Time: 35  Signed: Yecenia Dalgleish 02/22/2015, 11:04 AM

## 2015-02-22 NOTE — BHH Group Notes (Signed)
[facility] Group Notes:  (Nursing/MHT/Case Management/Adjunct)  Date:  02/22/2015  Time:  12:38 PM  Type of Therapy:  Psychoeducational Skills  Participation Level:  Active  Participation Quality:  Supportive  Affect:  Appropriate  Cognitive:  Appropriate  Insight:  Good  Engagement in Group:  Supportive  Modes of Intervention:  Support  Summary of Progress/Problems:  Mathew Aguilar 02/22/2015, 12:38 PM

## 2015-02-25 ENCOUNTER — Other Ambulatory Visit: Payer: Self-pay | Admitting: Psychiatry

## 2015-04-08 ENCOUNTER — Other Ambulatory Visit: Payer: Self-pay | Admitting: Internal Medicine

## 2015-04-08 DIAGNOSIS — N401 Enlarged prostate with lower urinary tract symptoms: Secondary | ICD-10-CM | POA: Insufficient documentation

## 2015-04-08 DIAGNOSIS — N281 Cyst of kidney, acquired: Secondary | ICD-10-CM

## 2015-04-11 ENCOUNTER — Ambulatory Visit
Admission: RE | Admit: 2015-04-11 | Discharge: 2015-04-11 | Disposition: A | Discharge status: Home / Self Care | Payer: Commercial Managed Care - HMO | Source: Ambulatory Visit | Attending: Internal Medicine | Admitting: Internal Medicine

## 2015-04-11 DIAGNOSIS — K760 Fatty (change of) liver, not elsewhere classified: Secondary | ICD-10-CM | POA: Diagnosis not present

## 2015-04-11 DIAGNOSIS — M4807 Spinal stenosis, lumbosacral region: Secondary | ICD-10-CM | POA: Diagnosis not present

## 2015-04-11 DIAGNOSIS — M5137 Other intervertebral disc degeneration, lumbosacral region: Secondary | ICD-10-CM | POA: Diagnosis not present

## 2015-04-11 DIAGNOSIS — M4806 Spinal stenosis, lumbar region: Secondary | ICD-10-CM | POA: Insufficient documentation

## 2015-04-11 DIAGNOSIS — N281 Cyst of kidney, acquired: Secondary | ICD-10-CM | POA: Insufficient documentation

## 2015-04-11 DIAGNOSIS — K5730 Diverticulosis of large intestine without perforation or abscess without bleeding: Secondary | ICD-10-CM | POA: Diagnosis not present

## 2015-04-11 MED ORDER — IOHEXOL 350 MG/ML SOLN
100.0000 mL | Freq: Once | INTRAVENOUS | Status: AC | PRN
  Administered 2015-04-11: 100 mL via INTRAVENOUS

## 2015-04-22 LAB — DRUG SCREEN, URINE
Amphetamines, Ur Screen: NEGATIVE
Barbiturates, Ur Screen: NEGATIVE
Benzodiazepine, Ur Scrn: POSITIVE
CANNABINOID 50 NG, UR ~~LOC~~: NEGATIVE
COCAINE METABOLITE, UR ~~LOC~~: NEGATIVE
MDMA (Ecstasy)Ur Screen: NEGATIVE
Methadone, Ur Screen: NEGATIVE
Opiate, Ur Screen: NEGATIVE
Phencyclidine (PCP) Ur S: NEGATIVE
TRICYCLIC, UR SCREEN: NEGATIVE

## 2015-04-22 LAB — COMPREHENSIVE METABOLIC PANEL
ALBUMIN: 3.8 g/dL (ref 3.4–5.0)
Alkaline Phosphatase: 56 U/L (ref 46–116)
Anion Gap: 9 (ref 7–16)
BUN: 12 mg/dL (ref 7–18)
Bilirubin,Total: 0.5 mg/dL (ref 0.2–1.0)
CREATININE: 0.68 mg/dL (ref 0.60–1.30)
Calcium, Total: 9 mg/dL (ref 8.5–10.1)
Chloride: 103 mmol/L (ref 98–107)
Co2: 21 mmol/L (ref 21–32)
GLUCOSE: 135 mg/dL — AB (ref 65–99)
Potassium: 4.3 mmol/L (ref 3.5–5.1)
SGOT(AST): 40 U/L — ABNORMAL HIGH (ref 15–37)
SGPT (ALT): 76 U/L — ABNORMAL HIGH (ref 14–63)
Sodium: 133 mmol/L — ABNORMAL LOW (ref 136–145)
Total Protein: 7.4 g/dL (ref 6.4–8.2)

## 2015-04-22 LAB — ETHANOL

## 2015-04-22 LAB — ACETAMINOPHEN LEVEL

## 2015-04-22 LAB — SALICYLATE LEVEL: Salicylates, Serum: <4 mg/dL

## 2015-05-23 ENCOUNTER — Emergency Department
Admission: EM | Admit: 2015-05-23 | Discharge: 2015-05-24 | Disposition: A | Discharge status: Home / Self Care | Payer: Commercial Managed Care - HMO | Attending: Emergency Medicine | Admitting: Emergency Medicine

## 2015-05-23 ENCOUNTER — Encounter: Payer: Self-pay | Admitting: *Deleted

## 2015-05-23 DIAGNOSIS — Z89221 Acquired absence of right upper limb above elbow: Secondary | ICD-10-CM | POA: Insufficient documentation

## 2015-05-23 DIAGNOSIS — Z79899 Other long term (current) drug therapy: Secondary | ICD-10-CM | POA: Diagnosis not present

## 2015-05-23 DIAGNOSIS — F332 Major depressive disorder, recurrent severe without psychotic features: Secondary | ICD-10-CM | POA: Diagnosis not present

## 2015-05-23 DIAGNOSIS — R45851 Suicidal ideations: Secondary | ICD-10-CM

## 2015-05-23 DIAGNOSIS — I10 Essential (primary) hypertension: Secondary | ICD-10-CM | POA: Insufficient documentation

## 2015-05-23 DIAGNOSIS — F329 Major depressive disorder, single episode, unspecified: Secondary | ICD-10-CM | POA: Diagnosis present

## 2015-05-23 DIAGNOSIS — F1021 Alcohol dependence, in remission: Secondary | ICD-10-CM

## 2015-05-23 HISTORY — DX: Restless legs syndrome: G25.81

## 2015-05-23 LAB — CBC
HCT: 47 % (ref 40.0–52.0)
HEMOGLOBIN: 15.9 g/dL (ref 13.0–18.0)
MCH: 29.7 pg (ref 26.0–34.0)
MCHC: 33.8 g/dL (ref 32.0–36.0)
MCV: 88 fL (ref 80.0–100.0)
PLATELETS: 220 10*3/uL (ref 150–440)
RBC: 5.34 MIL/uL (ref 4.40–5.90)
RDW: 13.9 % (ref 11.5–14.5)
WBC: 4.1 10*3/uL (ref 3.8–10.6)

## 2015-05-23 LAB — URINE DRUG SCREEN, QUALITATIVE (ARMC ONLY)
Amphetamines, Ur Screen: NOT DETECTED
Barbiturates, Ur Screen: NOT DETECTED
Benzodiazepine, Ur Scrn: NOT DETECTED
CANNABINOID 50 NG, UR ~~LOC~~: NOT DETECTED
COCAINE METABOLITE, UR ~~LOC~~: NOT DETECTED
MDMA (ECSTASY) UR SCREEN: NOT DETECTED
Methadone Scn, Ur: NOT DETECTED
OPIATE, UR SCREEN: NOT DETECTED
PHENCYCLIDINE (PCP) UR S: NOT DETECTED
Tricyclic, Ur Screen: NOT DETECTED

## 2015-05-23 LAB — COMPREHENSIVE METABOLIC PANEL
ALBUMIN: 4.1 g/dL (ref 3.5–5.0)
ALT: 106 U/L — ABNORMAL HIGH (ref 17–63)
ANION GAP: 9 (ref 5–15)
AST: 51 U/L — ABNORMAL HIGH (ref 15–41)
Alkaline Phosphatase: 49 U/L (ref 38–126)
BILIRUBIN TOTAL: 1.4 mg/dL — AB (ref 0.3–1.2)
BUN: 9 mg/dL (ref 6–20)
CO2: 22 mmol/L (ref 22–32)
Calcium: 9.1 mg/dL (ref 8.9–10.3)
Chloride: 107 mmol/L (ref 101–111)
Creatinine, Ser: 0.7 mg/dL (ref 0.61–1.24)
GFR calc non Af Amer: >60 mL/min (ref >60)
Glucose, Bld: 88 mg/dL (ref 65–99)
Potassium: 4 mmol/L (ref 3.5–5.1)
SODIUM: 138 mmol/L (ref 135–145)
Total Protein: 7.6 g/dL (ref 6.5–8.1)

## 2015-05-23 LAB — ACETAMINOPHEN LEVEL: Acetaminophen (Tylenol), Serum: <10 ug/mL — ABNORMAL LOW (ref 10–30)

## 2015-05-23 LAB — ETHANOL

## 2015-05-23 LAB — SALICYLATE LEVEL: Salicylate Lvl: <4 mg/dL (ref 2.8–30.0)

## 2015-05-23 MED ORDER — MIRTAZAPINE 15 MG PO TABS
45.0000 mg | ORAL_TABLET | Freq: Every day | ORAL | Status: DC
  Administered 2015-05-23: 45 mg via ORAL

## 2015-05-23 MED ORDER — HYDROCHLOROTHIAZIDE 12.5 MG PO CAPS
12.5000 mg | ORAL_CAPSULE | Freq: Every day | ORAL | Status: DC
  Administered 2015-05-23 – 2015-05-24 (×2): 12.5 mg via ORAL
  Filled 2015-05-23: qty 1

## 2015-05-23 MED ORDER — PANTOPRAZOLE SODIUM 40 MG PO TBEC
40.0000 mg | DELAYED_RELEASE_TABLET | Freq: Two times a day (BID) | ORAL | Status: DC
  Administered 2015-05-23 – 2015-05-24 (×2): 40 mg via ORAL
  Filled 2015-05-23: qty 1

## 2015-05-23 MED ORDER — ARIPIPRAZOLE 10 MG PO TABS
20.0000 mg | ORAL_TABLET | Freq: Every day | ORAL | Status: DC
  Administered 2015-05-23 – 2015-05-24 (×2): 20 mg via ORAL
  Filled 2015-05-23: qty 2

## 2015-05-23 MED ORDER — PANTOPRAZOLE SODIUM 40 MG PO TBEC
DELAYED_RELEASE_TABLET | ORAL | Status: AC
  Filled 2015-05-23: qty 1

## 2015-05-23 MED ORDER — VENLAFAXINE HCL ER 75 MG PO CP24
150.0000 mg | ORAL_CAPSULE | Freq: Every day | ORAL | Status: DC
  Administered 2015-05-24: 150 mg via ORAL
  Filled 2015-05-23: qty 2

## 2015-05-23 MED ORDER — MIRTAZAPINE 15 MG PO TABS
ORAL_TABLET | ORAL | Status: AC
  Filled 2015-05-23: qty 3

## 2015-05-23 MED ORDER — ATENOLOL 25 MG PO TABS
25.0000 mg | ORAL_TABLET | Freq: Every day | ORAL | Status: DC
  Administered 2015-05-23 – 2015-05-24 (×2): 25 mg via ORAL
  Filled 2015-05-23: qty 1

## 2015-05-23 MED ORDER — ROPINIROLE HCL 1 MG PO TABS
2.0000 mg | ORAL_TABLET | Freq: Every day | ORAL | Status: DC
  Administered 2015-05-23: 2 mg via ORAL
  Filled 2015-05-23 (×2): qty 2
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
## 2015-05-23 NOTE — ED Provider Notes (Addendum)
[facility] Emergency Department Provider Note  ____________________________________________  Time seen: 1636  I have reviewed the triage vital signs and the nursing notes.   HISTORY  Chief Complaint Depression  suicidal ideation    HPI Mathew Aguilar is a 64 y.o. male with a history of depression and with a history of attempts of prior self-harm who presents with report of depression and possible self-harm. He reports this is been going on for 3 or 4 days but worse over the past day or 2. He is having intrusive thoughts of hanging himself.  The patient has been hospitalized for depression and suicidal ideation in the past area.  He lives alone but does have family nearby, with a brother and a sister in [place] and another family member in [place].  He denies any physical ailments: No chest pain, soreness of breath, nausea vomiting, or abdominal pain. He does use a nebulizer when needed at home.    Past Medical History  Diagnosis Date  . Hypertension   . Depression   . Restless leg     Patient Active Problem List   Diagnosis Date Noted  . Severe recurrent major depressive disorder with psychotic features 02/21/2015  . COPD exacerbation 02/19/2015  . GERD (gastroesophageal reflux disease) 02/19/2015  . Restless leg syndrome 02/19/2015  . Major depression 02/18/2015  . Alcohol dependence in early full remission 02/18/2015  . High blood pressure 02/18/2015  . Restless legs syndrome 02/18/2015    Past Surgical History  Procedure Laterality Date  . Amputation arm      Current Outpatient Rx  Name  Route  Sig  Dispense  Refill  . albuterol (PROVENTIL) (2.5 MG/3ML) 0.083% nebulizer solution   Inhalation   Inhale 3 mLs (2.5 mg total) into the lungs every 4 (four) hours as needed for wheezing or shortness of breath.   75 mL   12   . ARIPiprazole (ABILIFY) 20 MG tablet   Oral   Take 1 tablet (20 mg total) by mouth daily.   30 tablet    0   . atenolol (TENORMIN) 25 MG tablet   Oral   Take 1 tablet (25 mg total) by mouth daily.   30 tablet   0   . fluticasone (FLONASE) 50 MCG/ACT nasal spray   Each Nare   Place 2 sprays into both nostrils daily as needed for rhinitis.          . hydrochlorothiazide (HYDRODIURIL) 12.5 MG tablet   Oral   Take 12.5 mg by mouth daily.         . meclizine (ANTIVERT) 12.5 MG tablet   Oral   Take 1 tablet (12.5 mg total) by mouth 3 (three) times daily as needed for dizziness or nausea.   15 tablet   1   . mirtazapine (REMERON) 45 MG tablet   Oral   Take 1 tablet (45 mg total) by mouth at bedtime.   30 tablet   0   . pantoprazole (PROTONIX) 40 MG tablet   Oral   Take 1 tablet (40 mg total) by mouth 2 (two) times daily.   30 tablet   0   . rOPINIRole (REQUIP) 3 MG tablet   Oral   Take 1 tablet (3 mg total) by mouth at bedtime.   30 tablet   0   . venlafaxine XR (EFFEXOR-XR) 150 MG 24 hr capsule   Oral   Take 1 capsule (150 mg total) by mouth daily with breakfast.  30 capsule   0     Allergies Review of patient's allergies indicates no known allergies.  Family History  Problem Relation Age of Onset  . Family history unknown: Yes    Social History Social History  Substance Use Topics  . Smoking status: Never Smoker   . Smokeless tobacco: Never Used  . Alcohol Use: No     Comment: history of ETOH abuse - sober several months    Review of Systems  Constitutional: Negative for fever. ENT: Negative for sore throat. Cardiovascular: Negative for chest pain. Respiratory: Negative for shortness of breath. Positive history of some difficulty breathing. Gastrointestinal: Negative for abdominal pain, vomiting and diarrhea. Genitourinary: Negative for dysuria. Musculoskeletal: No myalgias or injuries. Notable amputation of right arm Skin: Negative for rash. Neurological: Negative for headaches Psychological: Report of depression and suicidal ideation. See  history of present illness  10-point ROS otherwise negative.  ____________________________________________   PHYSICAL EXAM:  VITAL SIGNS: ED Triage Vitals  Enc Vitals Group     BP 05/23/15 1537 134/87 mmHg     Pulse Rate 05/23/15 1537 77     Resp 05/23/15 1537 16     Temp 05/23/15 1537 98.4 F (36.9 C)     Temp Source 05/23/15 1537 Oral     SpO2 05/23/15 1537 95 %     Weight 05/23/15 1537 168 lb (76.204 kg)     Height 05/23/15 1537 5' (1.524 m)     Head Cir --      Peak Flow --      Pain Score 05/23/15 1538 0     Pain Loc --      Pain Edu? --      Excl. in [place]? --     Constitutional:  Alert and oriented.patient is calm, quiet, mild depressed affect, but no distress. ENT   Head: Normocephalic and atraumatic.   Nose: No congestion/rhinnorhea.   Mouth/Throat: Mucous membranes are moist. Cardiovascular: Normal rate, regular rhythm, no murmur noted Respiratory:  Normal respiratory effort, no tachypnea.    Breath sounds are clear and equal bilaterally.  Gastrointestinal: Soft and nontender. No distention.  Back: No muscle spasm, no tenderness, no CVA tenderness. 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 The remainder of the exam is unremarkable without tenderness and with normal range of motion in all extremities.  No noted edema. Neurologic:  Normal speech and language. No gross focal neurologic deficits are appreciated.  Skin:  Skin is warm, dry. No rash noted. Psychiatric: Quiet, calm, communicative, intact thought process, mildly depressed affect..  ____________________________________________    LABS (pertinent positives/negatives)  Labs Reviewed  COMPREHENSIVE METABOLIC PANEL - Abnormal; Notable for the following:    AST 51 (*)    ALT 106 (*)    Total Bilirubin 1.4 (*)    All other components within normal limits  ACETAMINOPHEN LEVEL - Abnormal; Notable for the following:    Acetaminophen (Tylenol), Serum <10 (*)     All other components within normal limits  ETHANOL  SALICYLATE LEVEL  CBC  URINE DRUG SCREEN, QUALITATIVE ([facility] ONLY)   ____________________________________________   INITIAL IMPRESSION / ASSESSMENT AND PLAN / ED COURSE  Pertinent labs & imaging results that were available during my care of the patient were reviewed by me and considered in my medical decision making (see chart for details).  Calm, cooperative, 64 year old male with a history of depression and prior attempts at self-harm. He is thinking of the option  of having himself. He is here voluntarily. We will keep him here for further assistance area and psychiatric consult pending.  ----------------------------------------- 7:27 PM on 05/23/2015 -----------------------------------------  Patient appears medically stable. He has a slight elevation in his transaminases with a bilirubin total of 1.4. He has been moved to the behavioral health area for ongoing monitoring while we await psychiatric evaluation. ____________________________________________   FINAL CLINICAL IMPRESSION(S) / ED DIAGNOSES  Final diagnoses:  Major depressive disorder, recurrent, severe without psychotic features  Suicidal ideation      [name], MD 05/23/15 2028  [name], MD 05/23/15 2028

## 2015-05-23 NOTE — ED Notes (Signed)
Pt transferred into ED quad room 20h   Patient assigned to appropriate care area. Patient oriented to unit/care area: Informed that, for their safety, care areas are designed for safety and monitored by security cameras at all times; Visiting hours and phone times explained to patient. Patient verbalizes understanding, and verbal contract for safety obtained.

## 2015-05-23 NOTE — ED Notes (Signed)
Pt. Noted in room. No complaints or concerns voiced. No distress or abnormal behavior noted. Will continue to monitor with security cameras. Q 15 minute rounds continue. 

## 2015-05-23 NOTE — ED Notes (Signed)
Pt. Noted in the day room talking with other clients and watching the t.v.. No complaints or concerns voiced. No distress or abnormal behavior noted. Will continue to monitor with security cameras. Q 15 minute rounds continue.
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
## 2015-05-23 NOTE — ED Notes (Signed)
Supper provided along with an extra drink  Pt observed with no unusual behavior  Appropriate to stimulation  No verbalized needs or concerns at this time  NAD assessed  Continue to monitor 

## 2015-05-23 NOTE — ED Notes (Signed)
BEHAVIORAL HEALTH ROUNDING Patient sleeping: No. Patient alert and oriented: yes Behavior appropriate: Yes.  ; If no, describe:  Nutrition and fluids offered: yes Toileting and hygiene offered: Yes  Sitter present: q15 minute observations and security camera monitoring Law enforcement present: Yes  ODS  

## 2015-05-23 NOTE — ED Notes (Signed)
Pt. To BHU from ED ambulatory without difficulty, to room 8  . Report from [facility]. Pt. Is alert and oriented, warm and dry in no distress. Pt. Denies SI, HI, and AVH. Pt. Calm and cooperative; and c/o depression. Pt. Made aware of security cameras and Q15 minute rounds. Pt. Encouraged to let Nursing staff know of any concerns or needs.

## 2015-05-23 NOTE — ED Notes (Signed)
ENVIRONMENTAL ASSESSMENT Potentially harmful objects out of patient reach: Yes.   Personal belongings secured: Yes.   Patient dressed in hospital provided attire only: Yes.   Plastic bags out of patient reach: Yes.   Patient care equipment (cords, cables, call bells, lines, and drains) shortened, removed, or accounted for: Yes.   Equipment and supplies removed from bottom of stretcher: Yes.   Potentially toxic materials out of patient reach: Yes.   Sharps container removed or out of patient reach: Yes.     BEHAVIORAL HEALTH ROUNDING Patient sleeping: No. Patient alert and oriented: yes Behavior appropriate: Yes.  ; If no, describe:  Nutrition and fluids offered: yes Toileting and hygiene offered: Yes  Sitter present: q15 minute observations and security camera monitoring Law enforcement present: Yes  ODS  

## 2015-05-23 NOTE — BH Assessment (Signed)
Assessment Note  Mathew Aguilar is an 64 y.o. male presenting to the ED voluntarily with a history of depression and a history of attempts and possible self-harm. He reports this is been going on for 3 or 4 days but worse over the past day or 2. He is having intrusive thoughts of hanging himself but denies SI at the present time. The patient has been hospitalized for depression and suicidal ideation in the past area. Pt denies any drug/alcohol use.  Pt also denies any HI, AH/VH.  Axis I: Depressive Disorder NOS Axis II: Deferred Axis III:  Past Medical History  Diagnosis Date  . Hypertension   . Depression   . Restless leg    Axis IV: problems with primary support group Axis V: 61-70 mild symptoms  Past Medical History:  Past Medical History  Diagnosis Date  . Hypertension   . Depression   . Restless leg     Past Surgical History  Procedure Laterality Date  . Amputation arm      Family History:  Family History  Problem Relation Age of Onset  . Family history unknown: Yes    Social History:  reports that he has never smoked. He has never used smokeless tobacco. He reports that he does not drink alcohol or use illicit drugs.  Additional Social History:  Alcohol / Drug Use History of alcohol / drug use?: No history of alcohol / drug abuse  CIWA: CIWA-Ar BP: 133/75 mmHg Pulse Rate: 66 COWS:    Allergies: No Known Allergies  Home Medications:  (Not in a hospital admission)  OB/GYN Status:  No LMP for male patient.  General Assessment Data Location of Assessment: [facility] ED TTS Assessment: In system Is this a Tele or Face-to-Face Assessment?: Face-to-Face Is this an Initial Assessment or a Re-assessment for this encounter?: Initial Assessment Marital status: Single Maiden name: N/A Is patient pregnant?: No Pregnancy Status: No Living Arrangements: Alone Can pt return to current living arrangement?: Yes Admission Status: Voluntary Is patient capable of signing  voluntary admission?: Yes Referral Source: Self/Family/Friend Insurance type: Medicare  Medical Screening Exam ([place]) Medical Exam completed: Yes  Crisis Care Plan Living Arrangements: Alone Name of Psychiatrist: unknown Name of Therapist: unknown  Education Status Is patient currently in school?: No Current Grade: N/A Highest grade of school patient has completed: 12th Name of school: N/A Contact person: N/A  Risk to self with the past 6 months Suicidal Ideation: Yes-Currently Present Has patient been a risk to self within the past 6 months prior to admission? : Yes Suicidal Intent: Yes-Currently Present Has patient had any suicidal intent within the past 6 months prior to admission? : Yes Is patient at risk for suicide?: Yes Suicidal Plan?: Yes-Currently Present Has patient had any suicidal plan within the past 6 months prior to admission? : Yes Specify Current Suicidal Plan: Pt had planned to hang self. Access to Means: Yes Specify Access to Suicidal Means: Pt reports he had string What has been your use of drugs/alcohol within the last 12 months?: None reported Previous Attempts/Gestures: Yes How many times?: 2 Other Self Harm Risks: None reported Triggers for Past Attempts: Unpredictable Intentional Self Injurious Behavior: None Family Suicide History: Unknown Recent stressful life event(s): Other (Comment) (sadness) Persecutory voices/beliefs?: No Depression: Yes Depression Symptoms: Loss of interest in usual pleasures, Feeling worthless/self pity Substance abuse history and/or treatment for substance abuse?: No Suicide prevention information given to non-admitted patients: Not applicable  Risk to Others within the  past 6 months Homicidal Ideation: No Does patient have any lifetime risk of violence toward others beyond the six months prior to admission? : No Thoughts of Harm to Others: No Current Homicidal Intent: No Current Homicidal Plan:  No Access to Homicidal Means: No Identified Victim: None reported History of harm to others?: No Assessment of Violence: On admission Violent Behavior Description: None reported Does patient have access to weapons?: No Criminal Charges Pending?: No Does patient have a court date: No Is patient on probation?: No  Psychosis Hallucinations: None noted Delusions: None noted  Mental Status Report Appearance/Hygiene: In scrubs Eye Contact: Good Motor Activity: Freedom of movement Speech: Logical/coherent, Soft Level of Consciousness: Alert Mood: Anxious, Pleasant Affect: Appropriate to circumstance Anxiety Level: Minimal Thought Processes: Coherent Judgement: Partial Orientation: Person, Place, Time, Situation, Appropriate for developmental age Obsessive Compulsive Thoughts/Behaviors: None  Cognitive Functioning Concentration: Normal Memory: Recent Intact IQ: Average Insight: Fair Impulse Control: Fair Appetite: Good Weight Loss: 0 Weight Gain: 0 Sleep: No Change Total Hours of Sleep: 6 Vegetative Symptoms: None  ADLScreening [facility] Assessment Services) Patient's cognitive ability adequate to safely complete daily activities?: Yes Patient able to express need for assistance with ADLs?: Yes Independently performs ADLs?: Yes (appropriate for developmental age)  Prior Inpatient Therapy Prior Inpatient Therapy: No  Prior Outpatient Therapy Prior Outpatient Therapy: No Does patient have an ACCT team?: No Does patient have Intensive In-House Services?  : No Does patient have [facility] services? : No Does patient have P4CC services?: No  ADL Screening (condition at time of admission) Patient's cognitive ability adequate to safely complete daily activities?: Yes Patient able to express need for assistance with ADLs?: Yes Independently performs ADLs?: Yes (appropriate for developmental age)       Abuse/Neglect Assessment (Assessment to be complete while patient is  alone) Physical Abuse: Denies Verbal Abuse: Denies Sexual Abuse: Denies Exploitation of patient/patient's resources: Denies Self-Neglect: Denies Values / Beliefs Cultural Requests During Hospitalization: None Spiritual Requests During Hospitalization: None Consults Spiritual Care Consult Needed: No Social Work Consult Needed: No Regulatory affairs officer (For Healthcare) Does patient have an advance directive?: No Would patient like information on creating an advanced directive?: Yes Higher education careers adviser given    Additional Information 1:1 In Past 12 Months?: No CIRT Risk: No Elopement Risk: No Does patient have medical clearance?: Yes     Disposition:  Disposition Initial Assessment Completed for this Encounter: Yes Disposition of Patient: Other dispositions Other disposition(s): Other (Comment) (Psych MD consult)  On Site Evaluation by:   Reviewed with Physician:    Oneita Hurt 05/23/2015 9:14 PM

## 2015-05-23 NOTE — ED Notes (Signed)
Pt states he has been depressed, and it has been getting worse. Reports he is having dreams that someone is going to hurt him and he is scared to get out because he is afraid someone will hurt him. Reports feelings of SI, "I would hang myself."

## 2015-05-24 ENCOUNTER — Inpatient Hospital Stay (HOSPITAL_COMMUNITY)
Admission: AD | Admit: 2015-05-24 | Payer: Commercial Managed Care - HMO | Source: Intra-hospital | Admitting: Psychiatry

## 2015-05-24 DIAGNOSIS — F332 Major depressive disorder, recurrent severe without psychotic features: Secondary | ICD-10-CM | POA: Diagnosis not present

## 2015-05-24 NOTE — Consult Note (Signed)
[facility] Face-to-Face Psychiatry Consult   Reason for Consult:  Consult for this 64 year old man with a history of recurrent major depression and a past history of alcohol abuse. He had himself brought to the emergency room yesterday because of worsening depression Referring Physician:  Quale Patient Identification: Mathew Aguilar MRN:  [id number] Principal Diagnosis: Major depression Diagnosis:   Patient Active Problem List   Diagnosis Date Noted  . Severe recurrent major depressive disorder with psychotic features [F33.3] 02/21/2015  . COPD exacerbation [J44.1] 02/19/2015  . GERD (gastroesophageal reflux disease) [K21.9] 02/19/2015  . Restless leg syndrome [G25.81] 02/19/2015  . Major depression [F32.2] 02/18/2015  . Alcohol dependence in early full remission [F10.21] 02/18/2015  . High blood pressure [I10] 02/18/2015  . Restless legs syndrome [G25.81] 02/18/2015    Total Time spent with patient: 1 hour  Subjective:   Mathew Aguilar is a 64 y.o. male patient admitted with "I just got depressed".  HPI:  Patient states that he got depressed over the last 3 or 4 days. Mood got bad. He was not eating very well. His sleep was a little bit impaired. He had some transient suicidal thoughts without any intent or plan. He has not had a return of hallucinations. He has been compliant with his prescribed medicines. He was not drinking. He had some stress in the form of some difficult interactions with some people socially which may worsened his depression.  Past psychiatric history: Long-standing problems with depression. Positive past history of suicide attempts. More than once. Positive past history of heavy alcohol abuse. More recently has been compliant with medicine and outpatient treatment. Multiple hospitalizations in the past.  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  Family history: Positive for depression and substance abuse.  Social history: Patient lives in supported mental health housing by himself. Hasn't act team. Does have some friends that he gets along with although he gets anxious when he has to interact too much.  Current medication: See outpatient list. He takes Abilify and Remeron and Effexor as his main psychiatric medicine HPI Elements:   Quality:  Depressed mood and transient suicidal ideation. Severity:  Potentially severe but resolving. Timing:  Intermittent and bad over the last few days but getting better. Duration:  Clearing up after 3 or 4 days. Context:  Some social anxiety and stress.  Past Medical History:  Past Medical History  Diagnosis Date  . Hypertension   . Depression   . Restless leg     Past Surgical History  Procedure Laterality Date  . Amputation arm     Family History:  Family History  Problem Relation Age of Onset  . Family history unknown: Yes   Social History:  History  Alcohol Use No    Comment: history of ETOH abuse - sober several months     History  Drug Use No    Social History   Social History  . Marital Status: Single    Spouse Name: N/A  . Number of Children: N/A  . Years of Education: N/A   Social History Main Topics  . Smoking status: Never Smoker   . Smokeless tobacco: Never Used  . Alcohol Use: No     Comment: history of ETOH abuse - sober several months  . Drug Use: No  . Sexual Activity: Not Currently    Birth Control/ Protection: None  Other Topics Concern  . None   Social History Narrative   Additional Social History:    History of alcohol / drug use?: No history of alcohol / drug abuse                     Allergies:  No Known Allergies  Labs:  Results for orders placed or performed during the hospital encounter of 05/23/15 (from the past 48 hour(s))  Comprehensive metabolic panel     Status: Abnormal   Collection Time: 05/23/15  3:42 PM    Result Value Ref Range   Sodium 138 135 - 145 mmol/L   Potassium 4.0 3.5 - 5.1 mmol/L   Chloride 107 101 - 111 mmol/L   CO2 22 22 - 32 mmol/L   Glucose, Bld 88 65 - 99 mg/dL   BUN 9 6 - 20 mg/dL   Creatinine, Ser 0.70 0.61 - 1.24 mg/dL   Calcium 9.1 8.9 - 10.3 mg/dL   Total Protein 7.6 6.5 - 8.1 g/dL   Albumin 4.1 3.5 - 5.0 g/dL   AST 51 (H) 15 - 41 U/L   ALT 106 (H) 17 - 63 U/L   Alkaline Phosphatase 49 38 - 126 U/L   Total Bilirubin 1.4 (H) 0.3 - 1.2 mg/dL   GFR calc non Af Amer >60 >60 mL/min   GFR calc Af Amer >60 >60 mL/min    Comment: (NOTE) The eGFR has been calculated using the CKD EPI equation. This calculation has not been validated in all clinical situations. eGFR's persistently <60 mL/min signify possible Chronic Kidney Disease.    Anion gap 9 5 - 15  Ethanol (ETOH)     Status: None   Collection Time: 05/23/15  3:42 PM  Result Value Ref Range   Alcohol, Ethyl (B) <5 <5 mg/dL    Comment:        LOWEST DETECTABLE LIMIT FOR SERUM ALCOHOL IS 5 mg/dL FOR MEDICAL PURPOSES ONLY   Salicylate level     Status: None   Collection Time: 05/23/15  3:42 PM  Result Value Ref Range   Salicylate Lvl <3.4 2.8 - 30.0 mg/dL  Acetaminophen level     Status: Abnormal   Collection Time: 05/23/15  3:42 PM  Result Value Ref Range   Acetaminophen (Tylenol), Serum <10 (L) 10 - 30 ug/mL    Comment:        THERAPEUTIC CONCENTRATIONS VARY SIGNIFICANTLY. A RANGE OF 10-30 ug/mL MAY BE AN EFFECTIVE CONCENTRATION FOR MANY PATIENTS. HOWEVER, SOME ARE BEST TREATED AT CONCENTRATIONS OUTSIDE THIS RANGE. ACETAMINOPHEN CONCENTRATIONS >150 ug/mL AT 4 HOURS AFTER INGESTION AND >50 ug/mL AT 12 HOURS AFTER INGESTION ARE OFTEN ASSOCIATED WITH TOXIC REACTIONS.   CBC     Status: None   Collection Time: 05/23/15  3:42 PM  Result Value Ref Range   WBC 4.1 3.8 - 10.6 K/uL   RBC 5.34 4.40 - 5.90 MIL/uL   Hemoglobin 15.9 13.0 - 18.0 g/dL   HCT 47.0 40.0 - 52.0 %   MCV 88.0 80.0 - 100.0 fL    MCH 29.7 26.0 - 34.0 pg   MCHC 33.8 32.0 - 36.0 g/dL   RDW 13.9 11.5 - 14.5 %   Platelets 220 150 - 440 K/uL  Urine Drug Screen, Qualitative ([facility] only)     Status: None   Collection Time: 05/23/15  4:08 PM  Result Value Ref Range   Tricyclic, Ur Screen NONE DETECTED NONE DETECTED   Amphetamines, Ur Screen NONE  DETECTED NONE DETECTED   MDMA (Ecstasy)Ur Screen NONE DETECTED NONE DETECTED   Cocaine Metabolite,Ur [place] NONE DETECTED NONE DETECTED   Opiate, Ur Screen NONE DETECTED NONE DETECTED   Phencyclidine (PCP) Ur S NONE DETECTED NONE DETECTED   Cannabinoid 50 Ng, Ur [place] NONE DETECTED NONE DETECTED   Barbiturates, Ur Screen NONE DETECTED NONE DETECTED   Benzodiazepine, Ur Scrn NONE DETECTED NONE DETECTED   Methadone Scn, Ur NONE DETECTED NONE DETECTED    Comment: (NOTE) 376  Tricyclics, urine               Cutoff 1000 ng/mL 200  Amphetamines, urine             Cutoff 1000 ng/mL 300  MDMA (Ecstasy), urine           Cutoff 500 ng/mL 400  Cocaine Metabolite, urine       Cutoff 300 ng/mL 500  Opiate, urine                   Cutoff 300 ng/mL 600  Phencyclidine (PCP), urine      Cutoff 25 ng/mL 700  Cannabinoid, urine              Cutoff 50 ng/mL 800  Barbiturates, urine             Cutoff 200 ng/mL 900  Benzodiazepine, urine           Cutoff 200 ng/mL 1000 Methadone, urine                Cutoff 300 ng/mL 1100 1200 The urine drug screen provides only a preliminary, unconfirmed 1300 analytical test result and should not be used for non-medical 1400 purposes. Clinical consideration and professional judgment should 1500 be applied to any positive drug screen result due to possible 1600 interfering substances. A more specific alternate chemical method 1700 must be used in order to obtain a confirmed analytical result.  1800 Gas chromato graphy / mass spectrometry (GC/MS) is the preferred 1900 confirmatory method.     Vitals: Blood pressure 130/84, pulse 72, temperature 97.4 F (36.3 C),  temperature source Oral, resp. rate 18, height 5' (1.524 m), weight 76.204 kg (168 lb), SpO2 100 %.  Risk to Self: Suicidal Ideation: Yes-Currently Present Suicidal Intent: Yes-Currently Present Is patient at risk for suicide?: Yes Suicidal Plan?: Yes-Currently Present Specify Current Suicidal Plan: Pt had planned to hang self. Access to Means: Yes Specify Access to Suicidal Means: Pt reports he had string What has been your use of drugs/alcohol within the last 12 months?: None reported How many times?: 2 Other Self Harm Risks: None reported Triggers for Past Attempts: Unpredictable Intentional Self Injurious Behavior: None Risk to Others: Homicidal Ideation: No Thoughts of Harm to Others: No Current Homicidal Intent: No Current Homicidal Plan: No Access to Homicidal Means: No Identified Victim: None reported History of harm to others?: No Assessment of Violence: On admission Violent Behavior Description: None reported Does patient have access to weapons?: No Criminal Charges Pending?: No Does patient have a court date: No Prior Inpatient Therapy: Prior Inpatient Therapy: No Prior Outpatient Therapy: Prior Outpatient Therapy: No Does patient have an ACCT team?: No Does patient have Intensive In-House Services?  : No Does patient have [facility] services? : No Does patient have P4CC services?: No  Current Facility-Administered Medications  Medication Dose Route Frequency Provider Last Rate Last Dose  . ARIPiprazole (ABILIFY) tablet 20 mg  20 mg Oral Daily [name], MD  20 mg at 05/24/15 1035  . atenolol (TENORMIN) tablet 25 mg  25 mg Oral Daily [name], MD   25 mg at 05/24/15 1033  . hydrochlorothiazide (MICROZIDE) capsule 12.5 mg  12.5 mg Oral Daily [name], MD   12.5 mg at 05/24/15 1032  . mirtazapine (REMERON) tablet 45 mg  45 mg Oral QHS [name], MD   45 mg at 05/23/15 2301  . pantoprazole (PROTONIX) EC tablet 40 mg  40 mg Oral BID [name], MD   40 mg at 05/24/15 1033  . rOPINIRole (REQUIP) tablet 2 mg  2 mg Oral QHS [name], MD   2 mg at 05/23/15 2321  . venlafaxine XR (EFFEXOR-XR) 24 hr capsule 150 mg  150 mg Oral Q breakfast [name], MD   150 mg at 05/24/15 3086   Current Outpatient Prescriptions  Medication Sig Dispense Refill  . albuterol (PROVENTIL) (2.5 MG/3ML) 0.083% nebulizer solution Inhale 3 mLs (2.5 mg total) into the lungs every 4 (four) hours as needed for wheezing or shortness of breath. 75 mL 12  . ARIPiprazole (ABILIFY) 20 MG tablet Take 1 tablet (20 mg total) by mouth daily. 30 tablet 0  . atenolol (TENORMIN) 25 MG tablet Take 1 tablet (25 mg total) by mouth daily. 30 tablet 0  . fluticasone (FLONASE) 50 MCG/ACT nasal spray Place 2 sprays into both nostrils daily as needed for rhinitis.     . hydrochlorothiazide (HYDRODIURIL) 12.5 MG tablet Take 12.5 mg by mouth daily.    . meclizine (ANTIVERT) 12.5 MG tablet Take 1 tablet (12.5 mg total) by mouth 3 (three) times daily as needed for dizziness or nausea. 15 tablet 1  . mirtazapine (REMERON) 45 MG tablet Take 1 tablet (45 mg total) by mouth at bedtime. 30 tablet 0  . pantoprazole (PROTONIX) 40 MG tablet Take 1 tablet (40 mg total) by mouth 2 (two) times daily. 30 tablet 0  . rOPINIRole (REQUIP) 3 MG tablet Take 1 tablet (3 mg total) by mouth at bedtime. 30 tablet 0  . venlafaxine XR (EFFEXOR-XR) 150 MG 24 hr capsule Take 1 capsule (150 mg total) by mouth daily with breakfast. 30 capsule 0    Musculoskeletal: Strength & Muscle Tone: within normal limits Gait & Station: normal Patient leans: N/A  Psychiatric Specialty Exam: Physical Exam  Nursing note and vitals reviewed. Constitutional: He appears well-developed and well-nourished.  HENT:  Head: Normocephalic and atraumatic.  Eyes: Conjunctivae are normal. Pupils are equal, round, and reactive to light.  Neck: Normal range of motion.  Cardiovascular: Normal heart sounds.     Respiratory: Effort normal.  GI: Soft.  Musculoskeletal: Normal range of motion.       Arms: Neurological: He is alert.  Skin: Skin is warm and dry.  Psychiatric: He has a normal mood and affect. His speech is normal and behavior is normal. Judgment and thought content normal. Cognition and memory are normal.    Review of Systems  Constitutional: Negative.   HENT: Negative.   Eyes: Negative.   Respiratory: Negative.   Cardiovascular: Negative.   Gastrointestinal: Negative.   Musculoskeletal: Negative.   Skin: Negative.   Neurological: Negative.   Psychiatric/Behavioral: Negative for depression, suicidal ideas, hallucinations, memory loss and substance abuse. The patient is nervous/anxious. The patient does not have insomnia.     Blood pressure 130/84, pulse 72, temperature 97.4 F (36.3 C), temperature source Oral, resp. rate 18, height 5' (1.524 m), weight 76.204  kg (168 lb), SpO2 100 %.Body mass index is 32.81 kg/(m^2).  General Appearance: Fairly Groomed  Engineer, water::  Good  Speech:  Normal Rate  Volume:  Normal  Mood:  Euthymic  Affect:  Flat  Thought Process:  Coherent  Orientation:  Full (Time, Place, and Person)  Thought Content:  Negative  Suicidal Thoughts:  No  Homicidal Thoughts:  No  Memory:  Immediate;   Good Recent;   Fair Remote;   Fair  Judgement:  Fair  Insight:  Fair  Psychomotor Activity:  Normal  Concentration:  Fair  Recall:  [employer] [place]  Language: Fair  Akathisia:  No  Handed:  Right  AIMS (if indicated):     Assets:  Communication Skills Desire for Improvement Financial Resources/Insurance Housing Resilience Social Support  ADL's:  Intact  Cognition: WNL  Sleep:      Medical Decision Making: Review of Psycho-Social Stressors (1), Review or order clinical lab tests (1), Established Problem, Worsening (2), Review or order medicine tests (1) and Review of Medication Regimen & Side Effects (2)  Treatment Plan  Summary: Medication management and Plan Patient has been kept on his usual medicine. On evaluation today he is coherent in describing how his depression had been bad but is feeling better now. No longer having any suicidal ideation. Patient has safe housing and very good mental health treatment outside the hospital. He is lucid and safe for discharge I think. Case discussed with emergency room doctor. Supportive counseling with the patient. No new prescriptions needed. Patient can be discharged and will follow-up with his act team.  Plan:  Patient does not meet criteria for psychiatric inpatient admission. Supportive therapy provided about ongoing stressors. Disposition: Discharge at the discretion of ER physician  Alethia Berthold 05/24/2015 3:57 PM

## 2015-05-24 NOTE — ED Provider Notes (Signed)
The patient is seen and cleared of IVC and set for discharge by Dr. Weber Cooks. He has close follow-up with the ACT team. Discharge.  Delman Kitten, MD 05/24/15 [phone]

## 2015-05-24 NOTE — ED Notes (Signed)
BEHAVIORAL HEALTH ROUNDING Patient sleeping: Yes.   Patient alert and oriented: Patient sleeping. Behavior appropriate: Yes.  ; If no, describe:  Nutrition and fluids offered: No Toileting and hygiene offered: Yes  Sitter present: ED tech performing 15 minute checks. Law enforcement present: Yes  and [place].

## 2015-05-24 NOTE — BHH Counselor (Signed)
Pt. has been accepted to [facility]. Accepting physician is Dr. Shea Evans. Call report to [phone] Representative was Randall Hiss. ER Staff [facility].; Dr. Jacqualine Code, ER MD & Antionette Fairy. Patient's Nurse) have been made aware it.   However, after talking with the patient about the plans to move him to another facility. Patient informed the Psych MD (Dr. Weber Cooks) he has improved and was doing well. Patient will discharge home and follow up with his ACT Team.

## 2015-05-24 NOTE — ED Notes (Signed)
Pt. Noted in room. No complaints or concerns voiced. No distress or abnormal behavior noted. Will continue to monitor with security cameras. Q 15 minute rounds continue. 

## 2015-05-24 NOTE — ED Notes (Signed)
BEHAVIORAL HEALTH ROUNDING Patient sleeping: No. Patient alert and oriented: yes Behavior appropriate: Yes.  ; If no, describe:  Nutrition and fluids offered: Yes  Toileting and hygiene offered: Yes  Sitter present: ED tech performing every 15 minute check. Law enforcement present: Yes  and [place].

## 2015-05-24 NOTE — ED Notes (Signed)
Pt. Noted to be ambulating to the bathroom. No complaints or concerns voiced. No distress or abnormal behavior noted. Will continue to monitor with security cameras. Q 15 minute rounds continue.

## 2015-05-24 NOTE — ED Notes (Signed)
Patient is resting comfortably. 

## 2015-05-24 NOTE — ED Notes (Signed)

## 2015-05-24 NOTE — ED Notes (Signed)

## 2015-05-24 NOTE — Discharge Instructions (Signed)
You have been seen in the Emergency Department (ED) today for a psychiatric complaint.  You have been evaluated by psychiatry and we believe you are safe to be discharged from the hospital.    Please return to the ED immediately if you have ANY thoughts of hurting yourself or anyone else, so that we may help you.  Please avoid alcohol and drug use.  Follow up with your doctor and/or therapist as soon as possible regarding today's ED visit.   Please follow up any other recommendations and clinic appointments provided by the psychiatry team that saw you in the Emergency Department.   Depression Depression refers to feeling sad, low, down in the dumps, blue, gloomy, or empty. In general, there are two kinds of depression: 1. Normal sadness or normal grief. This kind of depression is one that we all feel from time to time after upsetting life experiences, such as the loss of a job or the ending of a relationship. This kind of depression is considered normal, is short lived, and resolves within a few days to 2 weeks. Depression experienced after the loss of a loved one (bereavement) often lasts longer than 2 weeks but normally gets better with time. 2. Clinical depression. This kind of depression lasts longer than normal sadness or normal grief or interferes with your ability to function at home, at work, and in school. It also interferes with your personal relationships. It affects almost every aspect of your life. Clinical depression is an illness. Symptoms of depression can also be caused by conditions other than those mentioned above, such as:  Physical illness. Some physical illnesses, including underactive thyroid gland (hypothyroidism), severe anemia, specific types of cancer, diabetes, uncontrolled seizures, heart and lung problems, strokes, and chronic pain are commonly associated with symptoms of depression.  Side effects of some prescription medicine. In some people, certain types of medicine  can cause symptoms of depression.  Substance abuse. Abuse of alcohol and illicit drugs can cause symptoms of depression. SYMPTOMS Symptoms of normal sadness and normal grief include the following:  Feeling sad or crying for short periods of time.  Not caring about anything (apathy).  Difficulty sleeping or sleeping too much.  No longer able to enjoy the things you used to enjoy.  Desire to be by oneself all the time (social isolation).  Lack of energy or motivation.  Difficulty concentrating or remembering.  Change in appetite or weight.  Restlessness or agitation. Symptoms of clinical depression include the same symptoms of normal sadness or normal grief and also the following symptoms:  Feeling sad or crying all the time.  Feelings of guilt or worthlessness.  Feelings of hopelessness or helplessness.  Thoughts of suicide or the desire to harm yourself (suicidal ideation).  Loss of touch with reality (psychotic symptoms). Seeing or hearing things that are not real (hallucinations) or having false beliefs about your life or the people around you (delusions and paranoia). DIAGNOSIS  The diagnosis of clinical depression is usually based on how bad the symptoms are and how long they have lasted. Your health care provider will also ask you questions about your medical history and substance use to find out if physical illness, use of prescription medicine, or substance abuse is causing your depression. Your health care provider may also order blood tests. TREATMENT  Often, normal sadness and normal grief do not require treatment. However, sometimes antidepressant medicine is given for bereavement to ease the depressive symptoms until they resolve. The treatment for clinical depression  depends on how bad the symptoms are but often includes antidepressant medicine, counseling with a mental health professional, or both. Your health care provider will help to determine what treatment is  best for you. Depression caused by physical illness usually goes away with appropriate medical treatment of the illness. If prescription medicine is causing depression, talk with your health care provider about stopping the medicine, decreasing the dose, or changing to another medicine. Depression caused by the abuse of alcohol or illicit drugs goes away when you stop using these substances. Some adults need professional help in order to stop drinking or using drugs. SEEK IMMEDIATE MEDICAL CARE IF:  You have thoughts about hurting yourself or others.  You lose touch with reality (have psychotic symptoms).  You are taking medicine for depression and have a serious side effect. FOR MORE INFORMATION  National [employer] on Mental Illness: www.nami.[employer]: https://carter.com/ Document Released: 08/28/2000 Document Revised: 01/15/2014 Document Reviewed: 11/30/2011 [facility] Patient Information 2015 [place], [place]. This information is not intended to replace advice given to you by your health care provider. Make sure you discuss any questions you have with your health care provider.

## 2015-06-11 ENCOUNTER — Emergency Department
Admission: EM | Admit: 2015-06-11 | Discharge: 2015-06-12 | Disposition: A | Discharge status: Other Acute Inpatient Hospital | Payer: Commercial Managed Care - HMO | Attending: Emergency Medicine | Admitting: Emergency Medicine

## 2015-06-11 ENCOUNTER — Encounter: Payer: Self-pay | Admitting: Emergency Medicine

## 2015-06-11 DIAGNOSIS — K219 Gastro-esophageal reflux disease without esophagitis: Secondary | ICD-10-CM | POA: Diagnosis present

## 2015-06-11 DIAGNOSIS — F332 Major depressive disorder, recurrent severe without psychotic features: Secondary | ICD-10-CM

## 2015-06-11 DIAGNOSIS — I10 Essential (primary) hypertension: Secondary | ICD-10-CM | POA: Insufficient documentation

## 2015-06-11 DIAGNOSIS — Z79899 Other long term (current) drug therapy: Secondary | ICD-10-CM | POA: Diagnosis not present

## 2015-06-11 DIAGNOSIS — F329 Major depressive disorder, single episode, unspecified: Secondary | ICD-10-CM | POA: Diagnosis present

## 2015-06-11 DIAGNOSIS — F1021 Alcohol dependence, in remission: Secondary | ICD-10-CM

## 2015-06-11 LAB — COMPREHENSIVE METABOLIC PANEL
ALBUMIN: 4 g/dL (ref 3.5–5.0)
ALT: 99 U/L — AB (ref 17–63)
AST: 60 U/L — AB (ref 15–41)
Alkaline Phosphatase: 44 U/L (ref 38–126)
Anion gap: 6 (ref 5–15)
BUN: 9 mg/dL (ref 6–20)
CHLORIDE: 109 mmol/L (ref 101–111)
CO2: 22 mmol/L (ref 22–32)
CREATININE: 0.74 mg/dL (ref 0.61–1.24)
Calcium: 8.8 mg/dL — ABNORMAL LOW (ref 8.9–10.3)
GFR calc Af Amer: >60 mL/min (ref >60)
GFR calc non Af Amer: >60 mL/min (ref >60)
Glucose, Bld: 95 mg/dL (ref 65–99)
Potassium: 3.8 mmol/L (ref 3.5–5.1)
SODIUM: 137 mmol/L (ref 135–145)
Total Bilirubin: 1.2 mg/dL (ref 0.3–1.2)
Total Protein: 7.4 g/dL (ref 6.5–8.1)

## 2015-06-11 LAB — CBC
HCT: 49 % (ref 40.0–52.0)
Hemoglobin: 16.6 g/dL (ref 13.0–18.0)
MCH: 29.9 pg (ref 26.0–34.0)
MCHC: 33.8 g/dL (ref 32.0–36.0)
MCV: 88.3 fL (ref 80.0–100.0)
PLATELETS: 217 10*3/uL (ref 150–440)
RBC: 5.54 MIL/uL (ref 4.40–5.90)
RDW: 13.8 % (ref 11.5–14.5)
WBC: 4.9 10*3/uL (ref 3.8–10.6)

## 2015-06-11 LAB — ACETAMINOPHEN LEVEL: Acetaminophen (Tylenol), Serum: <10 ug/mL — ABNORMAL LOW (ref 10–30)

## 2015-06-11 MED ORDER — ROPINIROLE HCL 1 MG PO TABS: 3.0000 mg | ORAL_TABLET | Freq: Every day | ORAL | Status: DC

## 2015-06-11 MED ORDER — DIPHENHYDRAMINE HCL 25 MG PO CAPS
50.0000 mg | ORAL_CAPSULE | Freq: Once | ORAL | Status: AC
  Administered 2015-06-11: 50 mg via ORAL
  Filled 2015-06-11: qty 2

## 2015-06-11 MED ORDER — ROPINIROLE HCL 1 MG PO TABS
ORAL_TABLET | ORAL | Status: AC
Start: 2015-06-11 — End: 2015-06-11
  Administered 2015-06-11: 3 mg
  Filled 2015-06-11: qty 3

## 2015-06-11 NOTE — ED Notes (Signed)

## 2015-06-11 NOTE — ED Notes (Signed)
Meal given

## 2015-06-11 NOTE — ED Notes (Signed)

## 2015-06-11 NOTE — ED Notes (Signed)
TTS in  To eval

## 2015-06-11 NOTE — ED Notes (Signed)
Pt in room laying down, no complaints of pain or discomfort.  Co of feeling depressed and states wants help.  Pt awaiting admission to beh med unit, pt aware of disposition.

## 2015-06-11 NOTE — ED Notes (Signed)
Pt to ed with c/o depression and SI.  Pt states he has been taking his meds.  Pt reports decreased appetite and difficulty sleeping.

## 2015-06-11 NOTE — ED Notes (Signed)
Patient in dayroom, socializing with other patients. Maintained on 15 minute checks and observation by security camera for safety.

## 2015-06-11 NOTE — ED Notes (Signed)
Report received from [facility] RN. Patient care assumed. Patient/RN introduction complete. Will continue to monitor.
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
## 2015-06-11 NOTE — ED Notes (Signed)

## 2015-06-11 NOTE — ED Notes (Signed)
Lunch provided along with an extra drink   Appropriate to stimulation  No verbalized needs or concerns at this time  NAD assessed  Continue to monitor 

## 2015-06-11 NOTE — ED Notes (Signed)
BEHAVIORAL HEALTH ROUNDING Patient sleeping: No. Patient alert and oriented: yes Behavior appropriate: Yes.  ; If no, describe:  Nutrition and fluids offered: yes Toileting and hygiene offered: Yes  Sitter present: q15 minute observations and security camera monitoring Law enforcement present: Yes  ODS  
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
## 2015-06-11 NOTE — ED Notes (Signed)
Patient resting comfortably in room. No complaints or concerns voiced. No distress or abnormal behavior noted. Will continue to monitor with security cameras. Q 15 minute rounds continue.

## 2015-06-11 NOTE — ED Notes (Signed)
Patient in dayroom. States he is "very sad" and needs to be admitted to hospital for medication adjustment. He "wants to be dead" but feels safe in the hospital. Maintained on 15 minute checks and observation by security camera for safety.

## 2015-06-11 NOTE — ED Notes (Signed)
Per intake pt will admitted  In the AM due to lack of bed availability in beh med unit.

## 2015-06-11 NOTE — ED Provider Notes (Signed)
[facility] Emergency Department Provider Note REMINDER - THIS NOTE IS NOT A FINAL MEDICAL RECORD UNTIL IT IS SIGNED. UNTIL THEN, THE CONTENT BELOW MAY REFLECT INFORMATION FROM A DOCUMENTATION TEMPLATE, NOT THE ACTUAL PATIENT VISIT. ____________________________________________  Time seen: Approximately 1:25 PM  I have reviewed the triage vital signs and the nursing notes.   HISTORY  Chief Complaint Depression    HPI Mathew Aguilar is a 64 y.o. male who presents to the ER with thoughts of depression and feeling like he does not want to live any longer. He denies to me that he has any plan that he is actually suicidal, but he feels like he just has no reason to continue living. He also reports he has not been sleeping well for the last few days.  Patient otherwise denies acute concerns. He does state he has been treated recently for depression and has been seen and admitted here in the past.  Denies overdose attempt.   Past Medical History  Diagnosis Date  . Hypertension   . Depression   . Restless leg     Patient Active Problem List   Diagnosis Date Noted  . Severe recurrent major depressive disorder with psychotic features 02/21/2015  . COPD exacerbation 02/19/2015  . GERD (gastroesophageal reflux disease) 02/19/2015  . Restless leg syndrome 02/19/2015  . Major depression 02/18/2015  . Alcohol dependence in early full remission 02/18/2015  . High blood pressure 02/18/2015  . Restless legs syndrome 02/18/2015    Past Surgical History  Procedure Laterality Date  . Amputation arm      Current Outpatient Rx  Name  Route  Sig  Dispense  Refill  . albuterol (PROVENTIL) (2.5 MG/3ML) 0.083% nebulizer solution   Inhalation   Inhale 3 mLs (2.5 mg total) into the lungs every 4 (four) hours as needed for wheezing or shortness of breath.   75 mL   12   . ARIPiprazole (ABILIFY) 20 MG tablet   Oral   Take 1 tablet (20 mg total) by mouth daily.  30 tablet   0   . atenolol (TENORMIN) 25 MG tablet   Oral   Take 1 tablet (25 mg total) by mouth daily.   30 tablet   0   . fluticasone (FLONASE) 50 MCG/ACT nasal spray   Each Nare   Place 2 sprays into both nostrils daily as needed for rhinitis.          . hydrochlorothiazide (HYDRODIURIL) 12.5 MG tablet   Oral   Take 12.5 mg by mouth daily.         . meclizine (ANTIVERT) 12.5 MG tablet   Oral   Take 1 tablet (12.5 mg total) by mouth 3 (three) times daily as needed for dizziness or nausea.   15 tablet   1   . mirtazapine (REMERON) 45 MG tablet   Oral   Take 1 tablet (45 mg total) by mouth at bedtime.   30 tablet   0   . pantoprazole (PROTONIX) 40 MG tablet   Oral   Take 1 tablet (40 mg total) by mouth 2 (two) times daily.   30 tablet   0   . rOPINIRole (REQUIP) 3 MG tablet   Oral   Take 1 tablet (3 mg total) by mouth at bedtime.   30 tablet   0   . venlafaxine XR (EFFEXOR-XR) 150 MG 24 hr capsule   Oral   Take 1 capsule (150 mg total) by mouth daily with  breakfast.   30 capsule   0     Allergies Review of patient's allergies indicates no known allergies.  Family History  Problem Relation Age of Onset  . Family history unknown: Yes    Social History Social History  Substance Use Topics  . Smoking status: Never Smoker   . Smokeless tobacco: Never Used  . Alcohol Use: No     Comment: history of ETOH abuse - sober several months    Review of Systems Constitutional: No fever/chills Eyes: No visual changes. ENT: No sore throat. Cardiovascular: Denies chest pain. Respiratory: Denies shortness of breath. Gastrointestinal: No abdominal pain.  No nausea, no vomiting.  No diarrhea.  No constipation. Genitourinary: Negative for dysuria. Musculoskeletal: Negative for back pain. Skin: Negative for rash. Neurological: Negative for headaches, focal weakness or numbness.  Denies hallucinations, does report he feels very depressed and no longer wishes  to live.  10-point ROS otherwise negative.  ____________________________________________   PHYSICAL EXAM:  VITAL SIGNS: ED Triage Vitals  Enc Vitals Group     BP 06/11/15 1001 145/81 mmHg     Pulse Rate 06/11/15 1001 85     Resp 06/11/15 1001 22     Temp 06/11/15 1001 98.1 F (36.7 C)     Temp Source 06/11/15 1001 Oral     SpO2 06/11/15 1001 95 %     Weight 06/11/15 1001 170 lb (77.111 kg)     Height 06/11/15 1001 5' (1.524 m)     Head Cir --      Peak Flow --      Pain Score 06/11/15 0956 0     Pain Loc --      Pain Edu? --      Excl. in [place]? --    Constitutional: Alert and oriented. Well appearing and in no acute distress. Eyes: Conjunctivae are normal. PERRL. EOMI. Head: Atraumatic. Nose: No congestion/rhinnorhea. Mouth/Throat: Mucous membranes are moist.  Oropharynx non-erythematous. Neck: No stridor.   Cardiovascular: Normal rate, regular rhythm. Grossly normal heart sounds.  Good peripheral circulation. Respiratory: Normal respiratory effort.  No retractions. Lungs CTAB. Gastrointestinal: Soft and nontender. No distention. No abdominal bruits. No CVA tenderness. Musculoskeletal: No lower extremity tenderness nor edema.  No joint effusions. Previous appendectomy dictation of the right upper extremity. Neurologic:  Normal speech and language. No gross focal neurologic deficits are appreciated. No gait instability. Skin:  Skin is warm, dry and intact. No rash noted. Psychiatric: Mood and affect are normal. Speech and behavior are normal.  ____________________________________________   LABS (all labs ordered are listed, but only abnormal results are displayed)  Labs Reviewed  COMPREHENSIVE METABOLIC PANEL - Abnormal; Notable for the following:    Calcium 8.8 (*)    AST 60 (*)    ALT 99 (*)    All other components within normal limits  ACETAMINOPHEN LEVEL - Abnormal; Notable for the following:    Acetaminophen (Tylenol), Serum <10 (*)    All other components  within normal limits  CBC   ____________________________________________  EKG   ____________________________________________  RADIOLOGY   ____________________________________________   PROCEDURES  Procedure(s) performed: None  Critical Care performed: No   Tylenol level < 10  ____________________________________________   INITIAL IMPRESSION / ASSESSMENT AND PLAN / ED COURSE  Pertinent labs & imaging results that were available during my care of the patient were reviewed by me and considered in my medical decision making (see chart for details).  Patient presents with depressive symptoms, passive thoughts of wanting  to die. He is a long history of severe depression. He states he has not had alcohol in over 170 days. He has no acute medical complaints. I discussed with Dr. Weber Cooks of psychiatry who advises admission for severe depressive symptoms and suicidality. I'll place the patient on involuntary commitment based on Dr. Weber Cooks assessment and patient's recommendation for admission to psychiatry.  The patient is medically cleared. ____________________________________________   FINAL CLINICAL IMPRESSION(S) / ED DIAGNOSES  Final diagnoses:  Major depressive disorder, recurrent, severe without psychotic features      Delman Kitten, MD 06/11/15 1446

## 2015-06-11 NOTE — ED Notes (Signed)

## 2015-06-11 NOTE — ED Notes (Signed)
Maintained on 15 minute checks and observation by security camera for safety. 

## 2015-06-11 NOTE — ED Notes (Signed)

## 2015-06-11 NOTE — ED Notes (Signed)
Pt given snack tray. 

## 2015-06-11 NOTE — ED Notes (Signed)

## 2015-06-11 NOTE — ED Notes (Addendum)
Report called to bhu nurse   Pt to transfer to bhu room 5  - admission pending awaiting bed availability in [facility]

## 2015-06-11 NOTE — ED Notes (Signed)

## 2015-06-12 ENCOUNTER — Inpatient Hospital Stay
Admit: 2015-06-12 | Discharge: 2015-06-14 | DRG: 885 | Disposition: A | Discharge status: Home / Self Care | Payer: Commercial Managed Care - HMO | Attending: Psychiatry | Admitting: Psychiatry

## 2015-06-12 DIAGNOSIS — F411 Generalized anxiety disorder: Secondary | ICD-10-CM | POA: Diagnosis present

## 2015-06-12 DIAGNOSIS — K219 Gastro-esophageal reflux disease without esophagitis: Secondary | ICD-10-CM | POA: Diagnosis present

## 2015-06-12 DIAGNOSIS — G4700 Insomnia, unspecified: Secondary | ICD-10-CM | POA: Diagnosis present

## 2015-06-12 DIAGNOSIS — F410 Panic disorder [episodic paroxysmal anxiety] without agoraphobia: Secondary | ICD-10-CM | POA: Diagnosis present

## 2015-06-12 DIAGNOSIS — F1021 Alcohol dependence, in remission: Secondary | ICD-10-CM | POA: Diagnosis present

## 2015-06-12 DIAGNOSIS — Z79899 Other long term (current) drug therapy: Secondary | ICD-10-CM | POA: Diagnosis not present

## 2015-06-12 DIAGNOSIS — J449 Chronic obstructive pulmonary disease, unspecified: Secondary | ICD-10-CM | POA: Diagnosis present

## 2015-06-12 DIAGNOSIS — G2581 Restless legs syndrome: Secondary | ICD-10-CM | POA: Diagnosis present

## 2015-06-12 DIAGNOSIS — Z89201 Acquired absence of right upper limb, unspecified level: Secondary | ICD-10-CM

## 2015-06-12 DIAGNOSIS — I10 Essential (primary) hypertension: Secondary | ICD-10-CM | POA: Diagnosis present

## 2015-06-12 DIAGNOSIS — F323 Major depressive disorder, single episode, severe with psychotic features: Principal | ICD-10-CM | POA: Diagnosis present

## 2015-06-12 DIAGNOSIS — R45851 Suicidal ideations: Secondary | ICD-10-CM | POA: Diagnosis present

## 2015-06-12 DIAGNOSIS — F329 Major depressive disorder, single episode, unspecified: Secondary | ICD-10-CM | POA: Diagnosis present

## 2015-06-12 DIAGNOSIS — F333 Major depressive disorder, recurrent, severe with psychotic symptoms: Secondary | ICD-10-CM | POA: Diagnosis not present

## 2015-06-12 DIAGNOSIS — F332 Major depressive disorder, recurrent severe without psychotic features: Secondary | ICD-10-CM | POA: Diagnosis not present

## 2015-06-12 LAB — LIPID PANEL
CHOLESTEROL: 155 mg/dL (ref 0–200)
HDL: 30 mg/dL — ABNORMAL LOW (ref >40)
LDL Cholesterol: 98 mg/dL (ref 0–99)
TRIGLYCERIDES: 136 mg/dL (ref <150)
Total CHOL/HDL Ratio: 5.2 RATIO
VLDL: 27 mg/dL (ref 0–40)

## 2015-06-12 LAB — HEMOGLOBIN A1C: HEMOGLOBIN A1C: 6 % (ref 4.0–6.0)

## 2015-06-12 LAB — TSH: TSH: 3.937 u[IU]/mL (ref 0.350–4.500)

## 2015-06-12 MED ORDER — ALBUTEROL SULFATE HFA 108 (90 BASE) MCG/ACT IN AERS
2.0000 | INHALATION_SPRAY | RESPIRATORY_TRACT | Status: DC | PRN
  Filled 2015-06-12: qty 6.7

## 2015-06-12 MED ORDER — TEMAZEPAM 15 MG PO CAPS
15.0000 mg | ORAL_CAPSULE | Freq: Every evening | ORAL | Status: DC | PRN
  Administered 2015-06-12 – 2015-06-13 (×2): 15 mg via ORAL
  Filled 2015-06-12 (×2): qty 1

## 2015-06-12 MED ORDER — ARIPIPRAZOLE 10 MG PO TABS
20.0000 mg | ORAL_TABLET | Freq: Every day | ORAL | Status: DC
  Administered 2015-06-12 – 2015-06-14 (×3): 20 mg via ORAL
  Filled 2015-06-12 (×3): qty 2

## 2015-06-12 MED ORDER — ACETAMINOPHEN 325 MG PO TABS
650.0000 mg | ORAL_TABLET | Freq: Four times a day (QID) | ORAL | Status: DC | PRN
  Administered 2015-06-12: 650 mg via ORAL
  Filled 2015-06-12: qty 2

## 2015-06-12 MED ORDER — HYDROXYZINE HCL 25 MG PO TABS
25.0000 mg | ORAL_TABLET | Freq: Three times a day (TID) | ORAL | Status: DC
  Administered 2015-06-12 – 2015-06-14 (×6): 25 mg via ORAL
  Filled 2015-06-12 (×7): qty 1

## 2015-06-12 MED ORDER — LORAZEPAM 1 MG PO TABS: 1.0000 mg | ORAL_TABLET | Freq: Once | ORAL | Status: DC

## 2015-06-12 MED ORDER — IBUPROFEN 600 MG PO TABS: 600.0000 mg | ORAL_TABLET | Freq: Four times a day (QID) | ORAL | Status: DC | PRN

## 2015-06-12 MED ORDER — VENLAFAXINE HCL ER 75 MG PO CP24
150.0000 mg | ORAL_CAPSULE | Freq: Every day | ORAL | Status: DC
  Administered 2015-06-13 – 2015-06-14 (×2): 150 mg via ORAL
  Filled 2015-06-12: qty 2

## 2015-06-12 MED ORDER — FLUTICASONE PROPIONATE 50 MCG/ACT NA SUSP
2.0000 | Freq: Every day | NASAL | Status: DC
  Administered 2015-06-14: 2 via NASAL
  Filled 2015-06-12: qty 16

## 2015-06-12 MED ORDER — MIRTAZAPINE 30 MG PO TABS
45.0000 mg | ORAL_TABLET | Freq: Every day | ORAL | Status: DC
  Administered 2015-06-12 – 2015-06-13 (×2): 45 mg via ORAL
  Filled 2015-06-12 (×2): qty 1

## 2015-06-12 MED ORDER — HYDROCHLOROTHIAZIDE 12.5 MG PO CAPS
12.5000 mg | ORAL_CAPSULE | Freq: Every day | ORAL | Status: DC
  Administered 2015-06-12 – 2015-06-14 (×3): 12.5 mg via ORAL
  Filled 2015-06-12 (×3): qty 1

## 2015-06-12 MED ORDER — ALUM & MAG HYDROXIDE-SIMETH 200-200-20 MG/5ML PO SUSP: 30.0000 mL | ORAL | Status: DC | PRN

## 2015-06-12 MED ORDER — MAGNESIUM HYDROXIDE 400 MG/5ML PO SUSP: 30.0000 mL | Freq: Every day | ORAL | Status: DC | PRN

## 2015-06-12 MED ORDER — LORAZEPAM 1 MG PO TABS
ORAL_TABLET | ORAL | Status: AC
  Administered 2015-06-12: 01:00:00
  Filled 2015-06-12: qty 1

## 2015-06-12 MED ORDER — ATENOLOL 25 MG PO TABS
25.0000 mg | ORAL_TABLET | Freq: Every day | ORAL | Status: DC
  Administered 2015-06-12 – 2015-06-14 (×3): 25 mg via ORAL
  Filled 2015-06-12 (×3): qty 1

## 2015-06-12 MED ORDER — PANTOPRAZOLE SODIUM 40 MG PO TBEC
40.0000 mg | DELAYED_RELEASE_TABLET | Freq: Every day | ORAL | Status: DC
  Administered 2015-06-12 – 2015-06-14 (×3): 40 mg via ORAL
  Filled 2015-06-12 (×3): qty 1

## 2015-06-12 MED ORDER — ROPINIROLE HCL 1 MG PO TABS
3.0000 mg | ORAL_TABLET | Freq: Every day | ORAL | Status: DC
  Administered 2015-06-12 – 2015-06-13 (×2): 3 mg via ORAL
  Filled 2015-06-12 (×2): qty 3

## 2015-06-12 NOTE — Progress Notes (Addendum)
Patient with depressed affect and cooperative behavior with meals, meds and plan of care. Fair adls, slightly disheveled. Good appetite. Quiet with peers, appropriate when staff initiates interaction. No SI/HI at this time. Attends therapy groups to learn and initiate coping skills for management of stressors and diagnosis. Safety maintained. Staff helped patient to fill out daily audit sheet.

## 2015-06-12 NOTE — ED Notes (Signed)
Report called to RN, pt admitted to beh med with police escort.

## 2015-06-12 NOTE — BHH Group Notes (Signed)
[facility] LCSW Group Therapy  06/12/2015 3:24 PM  Type of Therapy:  Group Therapy  Participation Level:  Minimal  Participation Quality:  Appropriate and Attentive  Affect:  Appropriate  Cognitive:  Alert, Appropriate and Oriented  Insight:  Improving  Engagement in Therapy:  Improving  Modes of Intervention:  Socialization and Support  Summary of Progress/Problems: Patient attended and participated minimally during group discussion. Patient was able to introduce himself and shared during ice breaker exercise but no other participating throughout group but was attentive AEB his body language and eye contact.   Keene Breath, MSW, LCSWA 06/12/2015, 3:24 PM

## 2015-06-12 NOTE — ED Notes (Signed)
Pt states he is unable to sleep at this time, med given per md order.

## 2015-06-12 NOTE — H&P (Addendum)
Psychiatric Admission Assessment Adult  Patient Identification: Mathew Aguilar MRN:  [id number] Date of Evaluation:  06/12/2015 Chief Complaint:  Major Depression Principal Diagnosis: Severe recurrent major depressive disorder with psychotic features Diagnosis:   Patient Active Problem List   Diagnosis Date Noted  . Major depression [F32.2] 06/12/2015  . Severe recurrent major depressive disorder with psychotic features [F33.3] 02/21/2015  . COPD exacerbation [J44.1] 02/19/2015  . GERD (gastroesophageal reflux disease) [K21.9] 02/19/2015  . Alcohol dependence in early full remission [F10.21] 02/18/2015  . Essential hypertension [I10] 02/18/2015  . Restless legs syndrome [G25.81] 02/18/2015   History of Present Illness: Identifying data. Mathew Aguilar is a 64 year old male with a history of psychotic depression.  Chief complaint. "I don't want to live anymore."  History of present illness. Information was obtained from the patient and the chart. Mathew Aguilar has a long history of depression and psychosis. He has been maintained on a combination of Abilify, Effexor, Remeron. He is in the care of [facility] ACT team. He reports good compliance with medications. In spite of good At the patient returns to the hospital complaining of symptoms of depression with poor sleep, decreased appetite, anhedonia, hopelessness worthlessness, poor energy and concentration, social isolation, crying spells that culminated in suicidal thinking with a plan to overdose on medications. He's had suicidal thoughts for the past week. He did not act on them. He denies any psychotic symptoms at present that has a history of psychotic depression. He reports heightened anxiety with frequent panic attacks. He has a history of alcoholism but has been sober for 150 days. He denies other substance use.  Past psychiatric history. Multiple hospitalizations for depression and self or substance use. He comes to the hospital regularly  every 3-4 months. I doubt that he benefits from hospitalizations. 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   family psychiatric history. Nonreported.  Social history. He is disabled from mental illness. He he lives independently. He used to have lady friends all the time but no longer this is true. He is rather isolated. He has the support from acting.  Total Time spent with patient: 1 hour  Past Psychiatric History:   Risk to Self: Is patient at risk for suicide?: Yes Risk to Others:   Prior Inpatient Therapy:   Prior Outpatient Therapy:    Alcohol Screening: 1. How often do you have a drink containing alcohol?: Never 9. Have you or someone else been injured as a result of your drinking?: No 10. Has a relative or friend or a doctor or another health worker been concerned about your drinking or suggested you cut down?: No Alcohol Use Disorder Identification Test Final Score (AUDIT): 0 Brief Intervention: AUDIT score less than 7 or less-screening does not suggest unhealthy drinking-brief intervention not indicated Substance Abuse History in the last 12 months:  Yes.   Consequences of Substance Abuse: Negative Previous Psychotropic Medications: Yes  Psychological Evaluations: No  Past Medical History:  Past Medical History  Diagnosis Date  . Hypertension   . Depression   . Restless leg     Past Surgical History  Procedure Laterality Date  . Amputation arm     Family History:  Family History  Problem Relation Age of Onset  . Family history unknown: Yes   Family Psychiatric  History:  Social History:  History  Alcohol Use No    Comment: history of ETOH abuse - sober several months  History  Drug Use No    Social History   Social History  . Marital Status: Single    Spouse Name: N/A  . Number of Children: N/A  . Years of Education: N/A   Social History Main Topics  . Smoking status: Never Smoker   .  Smokeless tobacco: Never Used  . Alcohol Use: No     Comment: history of ETOH abuse - sober several months  . Drug Use: No  . Sexual Activity: Not Currently    Birth Control/ Protection: None   Other Topics Concern  . None   Social History Narrative   Additional Social History:                         Allergies:  No Known Allergies Lab Results:  Results for orders placed or performed during the hospital encounter of 06/11/15 (from the past 48 hour(s))  Comprehensive metabolic panel     Status: Abnormal   Collection Time: 06/11/15 10:05 AM  Result Value Ref Range   Sodium 137 135 - 145 mmol/L   Potassium 3.8 3.5 - 5.1 mmol/L   Chloride 109 101 - 111 mmol/L   CO2 22 22 - 32 mmol/L   Glucose, Bld 95 65 - 99 mg/dL   BUN 9 6 - 20 mg/dL   Creatinine, Ser 0.74 0.61 - 1.24 mg/dL   Calcium 8.8 (L) 8.9 - 10.3 mg/dL   Total Protein 7.4 6.5 - 8.1 g/dL   Albumin 4.0 3.5 - 5.0 g/dL   AST 60 (H) 15 - 41 U/L   ALT 99 (H) 17 - 63 U/L   Alkaline Phosphatase 44 38 - 126 U/L   Total Bilirubin 1.2 0.3 - 1.2 mg/dL   GFR calc non Af Amer >60 >60 mL/min   GFR calc Af Amer >60 >60 mL/min    Comment: (NOTE) The eGFR has been calculated using the CKD EPI equation. This calculation has not been validated in all clinical situations. eGFR's persistently <60 mL/min signify possible Chronic Kidney Disease.    Anion gap 6 5 - 15  CBC     Status: None   Collection Time: 06/11/15 10:05 AM  Result Value Ref Range   WBC 4.9 3.8 - 10.6 K/uL   RBC 5.54 4.40 - 5.90 MIL/uL   Hemoglobin 16.6 13.0 - 18.0 g/dL   HCT 49.0 40.0 - 52.0 %   MCV 88.3 80.0 - 100.0 fL   MCH 29.9 26.0 - 34.0 pg   MCHC 33.8 32.0 - 36.0 g/dL   RDW 13.8 11.5 - 14.5 %   Platelets 217 150 - 440 K/uL  Acetaminophen level     Status: Abnormal   Collection Time: 06/11/15 10:05 AM  Result Value Ref Range   Acetaminophen (Tylenol), Serum <10 (L) 10 - 30 ug/mL    Comment:        THERAPEUTIC CONCENTRATIONS  VARY SIGNIFICANTLY. A RANGE OF 10-30 ug/mL MAY BE AN EFFECTIVE CONCENTRATION FOR MANY PATIENTS. HOWEVER, SOME ARE BEST TREATED AT CONCENTRATIONS OUTSIDE THIS RANGE. ACETAMINOPHEN CONCENTRATIONS >150 ug/mL AT 4 HOURS AFTER INGESTION AND >50 ug/mL AT 12 HOURS AFTER INGESTION ARE OFTEN ASSOCIATED WITH TOXIC REACTIONS.     Metabolic Disorder Labs:  Lab Results  Component Value Date   HGBA1C 5.2 04/22/2014   No results found for: PROLACTIN Lab Results  Component Value Date   CHOL 175 02/18/2015   TRIG 145 02/18/2015   HDL 35* 02/18/2015   CHOLHDL  5.0 02/18/2015   VLDL 29 02/18/2015   LDLCALC 111* 02/18/2015   LDLCALC 86 04/22/2014    Current Medications: Current Facility-Administered Medications  Medication Dose Route Frequency Provider Last Rate Last Dose  . acetaminophen (TYLENOL) tablet 650 mg  650 mg Oral Q6H PRN Gonzella Lex, MD      . albuterol (PROVENTIL HFA;VENTOLIN HFA) 108 (90 BASE) MCG/ACT inhaler 2 puff  2 puff Inhalation Q4H PRN Gonzella Lex, MD      . alum & mag hydroxide-simeth (MAALOX/MYLANTA) 200-200-20 MG/5ML suspension 30 mL  30 mL Oral Q4H PRN Gonzella Lex, MD      . ARIPiprazole (ABILIFY) tablet 20 mg  20 mg Oral Daily Gonzella Lex, MD      . atenolol (TENORMIN) tablet 25 mg  25 mg Oral Daily John T Clapacs, MD      . fluticasone (FLONASE) 50 MCG/ACT nasal spray 2 spray  2 spray Each Nare Daily John T Clapacs, MD      . hydrochlorothiazide (MICROZIDE) capsule 12.5 mg  12.5 mg Oral Daily John T Clapacs, MD      . magnesium hydroxide (MILK OF MAGNESIA) suspension 30 mL  30 mL Oral Daily PRN Gonzella Lex, MD      . mirtazapine (REMERON) tablet 45 mg  45 mg Oral QHS Gonzella Lex, MD      . pantoprazole (PROTONIX) EC tablet 40 mg  40 mg Oral Daily Gonzella Lex, MD      . rOPINIRole (REQUIP) tablet 3 mg  3 mg Oral QHS Gonzella Lex, MD      . Derrill Memo ON 06/13/2015] venlafaxine XR (EFFEXOR-XR) 24 hr capsule 150 mg  150 mg Oral Q breakfast Gonzella Lex, MD       PTA Medications: Prescriptions prior to admission  Medication Sig Dispense Refill Last Dose  . albuterol (PROVENTIL) (2.5 MG/3ML) 0.083% nebulizer solution Inhale 3 mLs (2.5 mg total) into the lungs every 4 (four) hours as needed for wheezing or shortness of breath. 75 mL 12   . ARIPiprazole (ABILIFY) 20 MG tablet Take 1 tablet (20 mg total) by mouth daily. 30 tablet 0   . atenolol (TENORMIN) 25 MG tablet Take 1 tablet (25 mg total) by mouth daily. 30 tablet 0   . fluticasone (FLONASE) 50 MCG/ACT nasal spray Place 2 sprays into both nostrils daily as needed for rhinitis.    02/18/2015 at Unknown time  . hydrochlorothiazide (HYDRODIURIL) 12.5 MG tablet Take 12.5 mg by mouth daily.   02/18/2015 at Unknown time  . meclizine (ANTIVERT) 12.5 MG tablet Take 1 tablet (12.5 mg total) by mouth 3 (three) times daily as needed for dizziness or nausea. 15 tablet 1 02/18/2015 at Unknown time  . mirtazapine (REMERON) 45 MG tablet Take 1 tablet (45 mg total) by mouth at bedtime. 30 tablet 0   . pantoprazole (PROTONIX) 40 MG tablet Take 1 tablet (40 mg total) by mouth 2 (two) times daily. 30 tablet 0   . rOPINIRole (REQUIP) 3 MG tablet Take 1 tablet (3 mg total) by mouth at bedtime. 30 tablet 0   . venlafaxine XR (EFFEXOR-XR) 150 MG 24 hr capsule Take 1 capsule (150 mg total) by mouth daily with breakfast. 30 capsule 0     Musculoskeletal: Strength & Muscle Tone: within normal limits Gait & Station: normal Patient leans: N/A  Psychiatric Specialty Exam: Physical Exam  Nursing note and vitals reviewed.   Review of Systems  Musculoskeletal: Positive  for myalgias.  All other systems reviewed and are negative.   Blood pressure 143/77, pulse 66, temperature 98.1 F (36.7 C), temperature source Oral, resp. rate 20, height 5' (1.524 m), weight 77.565 kg (171 lb), SpO2 94 %.Body mass index is 33.4 kg/(m^2).  See SRA.                                                  Sleep:         Treatment Plan Summary: Daily contact with patient to assess and evaluate symptoms and progress in treatment and Medication management   Mr. Zhen is a 64 year old male with a history of depression and multiple hospitalizations patient's admitted for suicidal thoughts.  1. Suicidal ideation. The patient is able to contract for safety in the hospital.  2. Mood. Effexor, Remeron and Abilify for depression and psychosis.  3. Hypertension. We'll continue atenolol and hydrochlorothiazide.   4. COPD. We'll continue inhalers.  5. GERD. We'll continue Protonix.  6. Restless leg syndrome. We'll continue Requip.  7. Insomnia. We'll offer Restoril.  8. Disposition. The patient will return to home. He will follow up with the ACT team.   Observation Level/Precautions:  15 minute checks  Laboratory:  CBC Chemistry Profile UDS UA  Psychotherapy:    Medications:    Consultations:    Discharge Concerns:    Estimated LOS:  Other:     I certify that inpatient services furnished can reasonably be expected to improve the patient's condition.   Jolanta Pucilowska 9/28/201611:23 AM

## 2015-06-12 NOTE — Tx Team (Signed)
Initial Interdisciplinary Treatment Plan   PATIENT STRESSORS: Medication change or noncompliance   PATIENT STRENGTHS: General fund of knowledge Motivation for treatment/growth   PROBLEM LIST: Problem List/Patient Goals Date to be addressed Date deferred Reason deferred Estimated date of resolution  Depression 06/12/15     Suicidal Ideation 06/12/15                                                DISCHARGE CRITERIA:  Improved stabilization in mood, thinking, and/or behavior  PRELIMINARY DISCHARGE PLAN: Outpatient therapy  PATIENT/FAMIILY INVOLVEMENT: This treatment plan has been presented to and reviewed with the patient, Mathew Aguilar, and/or family member.  The patient and family have been given the opportunity to ask questions and make suggestions.  Mathew Aguilar [facility] 06/12/2015, 6:14 AM

## 2015-06-12 NOTE — BHH Group Notes (Signed)
[facility] Group Notes:  (Nursing/MHT/Case Management/Adjunct)  Date:  06/12/2015  Time:  12:53 PM  Type of Therapy:  Psychoeducational Skills  Participation Level:  Minimal  Participation Quality:  Attentive  Affect:  Appropriate  Cognitive:  Appropriate  Insight:  Appropriate  Engagement in Group:  Limited  Modes of Intervention:  Clarification  Summary of Progress/Problems:  Mathew Aguilar 06/12/2015, 12:53 PM

## 2015-06-12 NOTE — ED Provider Notes (Signed)
-----------------------------------------   1:49 AM on 06/12/2015 -----------------------------------------  Patient admitted to behavioral med unit.  Paulette Blanch, MD 06/12/15 [phone]

## 2015-06-12 NOTE — BH Specialist Note (Signed)
TTS Contacted [employer] to gain authorization. Informed by recording that the offices are now closed and to contact them between 8am to 5 pm. Email was sent to [facility] specialist informing of authorization attempt.

## 2015-06-12 NOTE — ED Notes (Signed)

## 2015-06-12 NOTE — Progress Notes (Signed)
Recreation Therapy Notes  Date: 09.28.16 Time: 3:00 pm Location: Day Room  Group Topic: Self-esteem  Goal Area(s) Addresses:  Patient will write at least one positive trait. Patient will verbalize benefit of having a good self-esteem.  Behavioral Response: Arrived late, Attentive, Left early  Intervention: I Am  Activity: Patients were given a worksheet with the letter I on it and instructed to fill the letter with positive traits about themselves.  Education:LRT educated patients on ways they can increase their self-esteem.  Education Outcome: Patient left group before LRT educated patients.  Clinical Observations/Feedback: Patient arrived to group at approximately 3:30 pm. Patient completed activity by having LRT write positive traits down. Patient did not contribute to group discussion. Patient left group at approximately 3:40 pm. Patient did not return to group.  Leonette Monarch, LRT/CTRS 06/12/2015 4:39 PM

## 2015-06-12 NOTE — Progress Notes (Signed)
Patient admitted IVC for SI with no plan.  Patient has been compliant but feels that medications are no longer working.  Patient search performed.  No contraband found.

## 2015-06-12 NOTE — BHH Suicide Risk Assessment (Addendum)
[facility] Admission Suicide Risk Assessment   Nursing information obtained from:  Patient Demographic factors:  Male, Caucasian, Unemployed, Living alone Current Mental Status:  Suicidal ideation indicated by patient Loss Factors:  NA Historical Factors:  Prior suicide attempts, Family history of suicide Risk Reduction Factors:  Positive social support Total Time spent with patient: 1 hour Principal Problem: <principal problem not specified> Diagnosis:   Patient Active Problem List   Diagnosis Date Noted  . Severe recurrent major depressive disorder with psychotic features [F33.3] 02/21/2015  . COPD exacerbation [J44.1] 02/19/2015  . GERD (gastroesophageal reflux disease) [K21.9] 02/19/2015  . Restless leg syndrome [G25.81] 02/19/2015  . Major depression [F32.2] 02/18/2015  . Alcohol dependence in early full remission [F10.21] 02/18/2015  . High blood pressure [I10] 02/18/2015  . Restless legs syndrome [G25.81] 02/18/2015     Continued Clinical Symptoms:  Alcohol Use Disorder Identification Test Final Score (AUDIT): 0 The "Alcohol Use Disorders Identification Test", Guidelines for Use in Primary Care, Second Edition.  World Pharmacologist [facility]). Score between 0-7:  no or low risk or alcohol related problems. Score between 8-15:  moderate risk of alcohol related problems. Score between 16-19:  high risk of alcohol related problems. Score 20 or above:  warrants further diagnostic evaluation for alcohol dependence and treatment.   CLINICAL FACTORS:   Depression:   Severe   Musculoskeletal: Strength & Muscle Tone: within normal limits Gait & Station: normal Patient leans: N/A  Psychiatric Specialty Exam: I reviewed physical examination performed in the emergency room and angry with the findings. Physical Exam  Nursing note and vitals reviewed. Musculoskeletal:  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.    Review of Systems  Musculoskeletal: Positive for myalgias.  All other systems  reviewed and are negative.   Blood pressure 143/77, pulse 66, temperature 98.1 F (36.7 C), temperature source Oral, resp. rate 20, height 5' (1.524 m), weight 77.565 kg (171 lb), SpO2 94 %.Body mass index is 33.4 kg/(m^2).  General Appearance: Casual  Eye Contact::  Fair  Speech:  Slow  Volume:  Decreased  Mood:  Depressed  Affect:  Flat  Thought Process:  Goal Directed  Orientation:  Full (Time, Place, and Person)  Thought Content:  WDL  Suicidal Thoughts:  Yes.  with intent/plan  Homicidal Thoughts:  No  Memory:  Immediate;   Fair Recent;   Fair Remote;   Fair  Judgement:  Fair  Insight:  Fair  Psychomotor Activity:  Normal  Concentration:  Fair  Recall:  [employer] [place]  Language: Fair  Akathisia:  No  Handed:  Right  AIMS (if indicated):     Assets:  Communication Skills Desire for Improvement Financial Resources/Insurance Housing Physical Health Resilience  Sleep:     Cognition: WNL  ADL's:  Intact     COGNITIVE FEATURES THAT CONTRIBUTE TO RISK:  None    SUICIDE RISK:   Moderate:  Frequent suicidal ideation with limited intensity, and duration, some specificity in terms of plans, no associated intent, good self-control, limited dysphoria/symptomatology, some risk factors present, and identifiable protective factors, including available and accessible social support.  PLAN OF CARE: Hospital admission, medication management, discharge planning.  Medical Decision Making:  New problem, with additional work up planned, Review of Psycho-Social Stressors (1), Review or order clinical lab tests (1), Review of Medication Regimen & Side Effects (2) and Review of New Medication or Change in Dosage (2)   Mathew Aguilar is a 64 year old male with a history of depression and multiple hospitalizations  patient's admitted for suicidal thoughts.  1. Suicidal ideation. The patient is able to contract for safety in the hospital.  2. Mood. Effexor, Remeron and  Abilify for depression and psychosis.  3. Hypertension. We'll continue atenolol and hydrochlorothiazide.   4. COPD. We'll continue inhalers.  5. GERD. We'll continue Protonix.  6. Restless leg syndrome. We'll continue Requip.  7. Insomnia. We'll offer Restoril.  8. Disposition. The patient will return to home. He will follow up with accepting.  I certify that inpatient services furnished can reasonably be expected to improve the patient's condition.   Jolanta Pucilowska 06/12/2015, 10:36 AM

## 2015-06-12 NOTE — BHH Counselor (Signed)
Adult Comprehensive Assessment  Patient ID: Mathew Aguilar, male   DOB: 07-Oct-1950, 64 y.o.   MRN: [id number]  Information Source: Information source: Patient  Current Stressors:  Social relationships: reports not interested  Living/Environment/Situation:  Living Arrangements: Alone Living conditions (as described by patient or guardian): good How long has patient lived in current situation?: 4 years What is atmosphere in current home: Comfortable  Family History:  Marital status: Divorced Divorced, when?: 2005 What types of issues is patient dealing with in the relationship?: n/a Does patient have children?: Yes How many children?: 1 How is patient's relationship with their children?: close  Childhood History:  By whom was/is the patient raised?: Both parents Description of patient's relationship with caregiver when they were a child: good relationship Does patient have siblings?: Yes Number of Siblings: 3 Description of patient's current relationship with siblings: close Did patient suffer any verbal/emotional/physical/sexual abuse as a child?: No Did patient suffer from severe childhood neglect?: No Has patient ever been sexually abused/assaulted/raped as an adolescent or adult?: No Was the patient ever a victim of a crime or a disaster?: No Witnessed domestic violence?: No Has patient been effected by domestic violence as an adult?: No  Education:  Highest grade of school patient has completed: 8th Currently a student?: No Name of school: [employer] Learning disability?: Yes What learning problems does patient have?: learning challenges  Employment/Work Situation:   Employment situation: On disability Why is patient on disability: medically How long has patient been on disability: since 64 Patient's job has been impacted by current illness: Yes 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 Has patient ever been in the [employer]?: No Has patient ever served in Recruitment consultant?: No  Financial Resources:   Museum/gallery curator resources: Teacher, early years/pre Does patient have a Programmer, applications or guardian?: No  Alcohol/Substance Abuse:      Social Support System:   Heritage manager System: Fair Astronomer System: [facility], neighbors, son Type of faith/religion: Christian  Leisure/Recreation:      Strengths/Needs:      Discharge Plan:   Does patient have access to transportation?: Yes Will patient be returning to same living situation after discharge?: Yes Currently receiving community mental health services: Yes (From Whom) ([facility]) Does patient have financial barriers related to discharge medications?: No  Summary/Recommendations: Patient is a divorced 64 yo wm admitted for SI and depression stating he quit taking his meds and that [facility] was not helping him like he hoped. Patient will return home to his apartment at discharge and will follow up with [facility] at discharge. Patient is encouraged to take medications and participate in group therapy  And therapeutic milieu.    Keene Breath., MSW, Latanya Presser  06/12/2015

## 2015-06-13 NOTE — BHH Group Notes (Signed)
[place] Group Notes:  (Nursing/MHT/Case Management/Adjunct)  Date:  06/13/2015  Time:  9:26 PM  Type of Therapy:  Group Therapy  Participation Level:  Active  Participation Quality:  Appropriate  Affect:  Appropriate  Cognitive:  Appropriate  Insight:  Appropriate  Engagement in Group:  Engaged  Modes of Intervention:  Discussion  Summary of Progress/Problems:  Kandis Fantasia 06/13/2015, 9:26 PM

## 2015-06-13 NOTE — Progress Notes (Signed)
Patient with depressed affect and cooperative behavior with meals, meds and plan of care. States he slept well and his appetite is improving. Good adls. Quiet speech and appropriate when staff initiates interaction. Minimal interaction with peers. Rests in bed during free time. Attending therapy groups to learn and initiate coping skills. Writer assisted patient to complete daily goal sheet and patient reports his goal as focusing on discharge. No SI/HI at this time. Safety maintained.

## 2015-06-13 NOTE — Progress Notes (Signed)
Pt is anxious but cooperative. Rates his level of depression and anxiety 7(0low- 10 worst). Pacing walking up and down the halls. Restoril 15 mg po given at 2322 for sleep. Denies SI/HI/AV/H. Slept 5 hours. No c/o pain/discomfort noted.

## 2015-06-13 NOTE — Plan of Care (Signed)
Problem: Ineffective individual coping Goal: STG: Patient will remain free from self harm Outcome: Not Met (add Reason) Pt anxious and pacing the unit. No voiced thoughts of hurting himself. No injuries noted. q 15 min checks maintained for safety.

## 2015-06-13 NOTE — Progress Notes (Signed)
[facility] MD Progress Note  06/13/2015 11:37 AM GERAMY LAMORTE  MRN:  [id number]  Subjective:  Mr. Sandlin feels better today. He slept well for the first time in several days. His mood is improving affect is brighter. He denies psychotic symptoms. His appetite is better. He lives alone so preparing food is not easy for him.  He enjoys company on the unit. She interacts appropriately with staff and peers. Good group participation. He accepts medications and tolerates them well. Reportedly he had his medications all "mixed up". He reports of slight pain in his amputated that is relieved with Tylenol.   Principal Problem: Severe recurrent major depressive disorder with psychotic features Diagnosis:   Patient Active Problem List   Diagnosis Date Noted  . Major depression [F32.2] 06/12/2015  . Severe recurrent major depressive disorder with psychotic features [F33.3] 02/21/2015  . COPD exacerbation [J44.1] 02/19/2015  . GERD (gastroesophageal reflux disease) [K21.9] 02/19/2015  . Alcohol dependence in early full remission [F10.21] 02/18/2015  . Essential hypertension [I10] 02/18/2015  . Restless legs syndrome [G25.81] 02/18/2015   Total Time spent with patient: 20 minutes  Past Psychiatric History:There is a long history of psychotic depression.  Past Medical History:  Past Medical History  Diagnosis Date  . Hypertension   . Depression   . Restless leg     Past Surgical History  Procedure Laterality Date  . Amputation arm     Family History:  Family History  Problem Relation Age of Onset  . Family history unknown: Yes   Family Psychiatric  History: None reported.  Social History:  History  Alcohol Use No    Comment: history of ETOH abuse - sober several months     History  Drug Use No    Social History   Social History  . Marital Status: Single    Spouse Name: N/A  . Number of Children: N/A  . Years of Education: N/A   Social History Main Topics  . Smoking status:  Never Smoker   . Smokeless tobacco: Never Used  . Alcohol Use: No     Comment: history of ETOH abuse - sober several months  . Drug Use: No  . Sexual Activity: Not Currently    Birth Control/ Protection: None   Other Topics Concern  . None   Social History Narrative   Additional Social History:                         Sleep: Good  Appetite:  Good  Current Medications: Current Facility-Administered Medications  Medication Dose Route Frequency Provider Last Rate Last Dose  . acetaminophen (TYLENOL) tablet 650 mg  650 mg Oral Q6H PRN Gonzella Lex, MD   650 mg at 06/12/15 1129  . albuterol (PROVENTIL HFA;VENTOLIN HFA) 108 (90 BASE) MCG/ACT inhaler 2 puff  2 puff Inhalation Q4H PRN Gonzella Lex, MD      . alum & mag hydroxide-simeth (MAALOX/MYLANTA) 200-200-20 MG/5ML suspension 30 mL  30 mL Oral Q4H PRN Gonzella Lex, MD      . ARIPiprazole (ABILIFY) tablet 20 mg  20 mg Oral Daily Gonzella Lex, MD   20 mg at 06/13/15 0914  . atenolol (TENORMIN) tablet 25 mg  25 mg Oral Daily Gonzella Lex, MD   25 mg at 06/13/15 0915  . fluticasone (FLONASE) 50 MCG/ACT nasal spray 2 spray  2 spray Each Nare Daily Gonzella Lex, MD   2 spray at  06/12/15 1132  . hydrochlorothiazide (MICROZIDE) capsule 12.5 mg  12.5 mg Oral Daily Gonzella Lex, MD   12.5 mg at 06/13/15 0914  . hydrOXYzine (ATARAX/VISTARIL) tablet 25 mg  25 mg Oral TID Clovis Fredrickson, MD   25 mg at 06/13/15 0917  . ibuprofen (ADVIL,MOTRIN) tablet 600 mg  600 mg Oral Q6H PRN Jolanta B Pucilowska, MD      . magnesium hydroxide (MILK OF MAGNESIA) suspension 30 mL  30 mL Oral Daily PRN Gonzella Lex, MD      . mirtazapine (REMERON) tablet 45 mg  45 mg Oral QHS Gonzella Lex, MD   45 mg at 06/12/15 2132  . pantoprazole (PROTONIX) EC tablet 40 mg  40 mg Oral Daily Gonzella Lex, MD   40 mg at 06/13/15 0914  . rOPINIRole (REQUIP) tablet 3 mg  3 mg Oral QHS Gonzella Lex, MD   3 mg at 06/12/15 2132  . temazepam  (RESTORIL) capsule 15 mg  15 mg Oral QHS PRN Clovis Fredrickson, MD   15 mg at 06/12/15 2322  . venlafaxine XR (EFFEXOR-XR) 24 hr capsule 150 mg  150 mg Oral Q breakfast Gonzella Lex, MD   150 mg at 06/13/15 9371    Lab Results:  Results for orders placed or performed during the hospital encounter of 06/12/15 (from the past 48 hour(s))  Hemoglobin A1c     Status: None   Collection Time: 06/12/15 11:45 AM  Result Value Ref Range   Hgb A1c MFr Bld 6.0 4.0 - 6.0 %  Lipid panel, fasting     Status: Abnormal   Collection Time: 06/12/15 11:45 AM  Result Value Ref Range   Cholesterol 155 0 - 200 mg/dL   Triglycerides 136 <150 mg/dL   HDL 30 (L) >40 mg/dL   Total CHOL/HDL Ratio 5.2 RATIO   VLDL 27 0 - 40 mg/dL   LDL Cholesterol 98 0 - 99 mg/dL    Comment:        Total Cholesterol/HDL:CHD Risk Coronary Heart Disease Risk Table                     Men   Women  1/2 Average Risk   3.4   3.3  Average Risk       5.0   4.4  2 X Average Risk   9.6   7.1  3 X Average Risk  23.4   11.0        Use the calculated Patient Ratio above and the CHD Risk Table to determine the patient's CHD Risk.        ATP III CLASSIFICATION (LDL):  <100     mg/dL   Optimal  100-129  mg/dL   Near or Above                    Optimal  130-159  mg/dL   Borderline  160-189  mg/dL   High  >190     mg/dL   Very High   TSH     Status: None   Collection Time: 06/12/15 11:45 AM  Result Value Ref Range   TSH 3.937 0.350 - 4.500 uIU/mL    Physical Findings: AIMS: Facial and Oral Movements Muscles of Facial Expression: None, normal Lips and Perioral Area: None, normal Jaw: None, normal Tongue: None, normal,Extremity Movements Upper (arms, wrists, hands, fingers): None, normal Lower (legs, knees, ankles, toes): None, normal, Trunk Movements Neck, shoulders, hips:  None, normal, Overall Severity Severity of abnormal movements (highest score from questions above): None, normal Incapacitation due to abnormal  movements: None, normal Patient's awareness of abnormal movements (rate only patient's report): No Awareness, Dental Status Current problems with teeth and/or dentures?: No Does patient usually wear dentures?: Yes  CIWA:    COWS:     Musculoskeletal: Strength & Muscle Tone: within normal limits Gait & Station: normal Patient leans: N/A  Psychiatric Specialty Exam: Review of Systems  Musculoskeletal: Positive for myalgias.  All other systems reviewed and are negative.   Blood pressure 107/72, pulse 71, temperature 98.2 F (36.8 C), temperature source Oral, resp. rate 20, height 5' (1.524 m), weight 77.565 kg (171 lb), SpO2 94 %.Body mass index is 33.4 kg/(m^2).  General Appearance: Casual  Eye Contact::  Good  Speech:  Normal Rate  Volume:  Normal  Mood:  Anxious and Depressed  Affect:  Appropriate  Thought Process:  Goal Directed  Orientation:  Full (Time, Place, and Person)  Thought Content:  WDL  Suicidal Thoughts:  No  Homicidal Thoughts:  No  Memory:  Immediate;   Fair Recent;   Fair Remote;   Fair  Judgement:  Fair  Insight:  Fair  Psychomotor Activity:  Normal  Concentration:  Fair  Recall:  [employer] [place]  Language: Fair  Akathisia:  No  Handed:  Right  AIMS (if indicated):     Assets:  Communication Skills Desire for Improvement Financial Resources/Insurance Housing Physical Health Resilience Social Support  ADL's:  Intact  Cognition: WNL  Sleep:  Number of Hours: 5   Treatment Plan Summary: Daily contact with patient to assess and evaluate symptoms and progress in treatment and Medication management   Mr. Mccready is a 64 year old male with a history of depression and multiple hospitalizations patient's admitted for suicidal thoughts.  1. Suicidal ideation. The patient is able to contract for safety in the hospital.  2. Mood. Effexor, Remeron and Abilify for depression and psychosis.  3. Hypertension. We'll continue atenolol and  hydrochlorothiazide.   4. COPD. We'll continue inhalers.  5. GERD. We'll continue Protonix.  6. Restless leg syndrome. We'll continue Requip.  7. Insomnia. We'll offer Restoril.  8. Disposition. The patient will return to home. He will follow up with ACT team   Jolanta Pucilowska 06/13/2015, 11:37 AM

## 2015-06-13 NOTE — Plan of Care (Signed)
Problem: Consults Goal: Suicide Risk Patient Education (See Patient Education module for education specifics)  Outcome: Progressing Patient remains depressed affect, is actively participating in plan of care.

## 2015-06-13 NOTE — BHH Group Notes (Signed)
[place] Group Notes:  (Nursing/MHT/Case Management/Adjunct)  Date:  06/13/2015  Time:  1:34 PM  Type of Therapy:  Psychoeducational Skills  Participation Level:  Minimal  Participation Quality:  Appropriate  Affect:  Appropriate  Cognitive:  Appropriate and Lacking  Insight:  Lacking  Engagement in Group:  Limited  Modes of Intervention:  Discussion, Education and Support  Summary of Progress/Problems:  Mathew Aguilar 06/13/2015, 1:34 PM

## 2015-06-14 MED ORDER — HYDROXYZINE HCL 25 MG PO TABS: 25.0000 mg | ORAL_TABLET | Freq: Three times a day (TID) | ORAL | Status: DC

## 2015-06-14 MED ORDER — MIRTAZAPINE 45 MG PO TABS: 45.0000 mg | ORAL_TABLET | Freq: Every day | ORAL | Status: DC

## 2015-06-14 MED ORDER — VENLAFAXINE HCL ER 150 MG PO CP24: 150.0000 mg | ORAL_CAPSULE | Freq: Every day | ORAL | Status: DC

## 2015-06-14 MED ORDER — ROPINIROLE HCL 3 MG PO TABS: 3.0000 mg | ORAL_TABLET | Freq: Every day | ORAL | Status: DC

## 2015-06-14 MED ORDER — ARIPIPRAZOLE 20 MG PO TABS: 20.0000 mg | ORAL_TABLET | Freq: Every day | ORAL | Status: DC

## 2015-06-14 MED ORDER — PANTOPRAZOLE SODIUM 40 MG PO TBEC: 40.0000 mg | DELAYED_RELEASE_TABLET | Freq: Two times a day (BID) | ORAL | Status: DC

## 2015-06-14 MED ORDER — HYDROCHLOROTHIAZIDE 12.5 MG PO TABS: 12.5000 mg | ORAL_TABLET | Freq: Every day | ORAL | Status: DC

## 2015-06-14 MED ORDER — ATENOLOL 25 MG PO TABS: 25.0000 mg | ORAL_TABLET | Freq: Every day | ORAL | Status: DC

## 2015-06-14 MED ORDER — TEMAZEPAM 15 MG PO CAPS: 15.0000 mg | ORAL_CAPSULE | Freq: Every evening | ORAL | Status: DC | PRN

## 2015-06-14 NOTE — Progress Notes (Signed)
Patient denies SI/HI, denies A/V hallucinations. Patient verbalizes understanding of discharge instructions, follow up care and prescriptions. Patient given all belongings from  locker. Patient escorted out by staff, transported to car by courtesy cab.

## 2015-06-14 NOTE — Plan of Care (Signed)
Problem: Diagnosis: Increased Risk For Suicide Attempt Goal: STG-Patient Will Comply With Medication Regime Outcome: Progressing Pt is taking medications as prescribed and familiar with regimen.

## 2015-06-14 NOTE — BHH Group Notes (Signed)
[place] Group Notes:  (Nursing/MHT/Case Management/Adjunct)  Date:  06/14/2015  Time:  12:09 PM  Type of Therapy:  Psychoeducational Skills  Participation Level:  Active  Participation Quality:  Appropriate  Affect:  Appropriate  Cognitive:  Appropriate  Insight:  Appropriate  Engagement in Group:  Engaged and Improving  Modes of Intervention:  Discussion and Education  Summary of Progress/Problems:  Drake Leach 06/14/2015, 12:09 PM

## 2015-06-14 NOTE — BHH Suicide Risk Assessment (Signed)
[place] INPATIENT:  Family/Significant Other Suicide Prevention Education  Suicide Prevention Education:  Patient Refusal for Family/Significant Other Suicide Prevention Education: The patient Mathew Aguilar has refused to provide written consent for family/significant other to be provided Family/Significant Other Suicide Prevention Education during admission and/or prior to discharge.  Physician notified.  Keene Breath, MSW, LCSWA 06/14/2015, 11:58 AM

## 2015-06-14 NOTE — Plan of Care (Signed)
Problem: Diagnosis: Increased Risk For Suicide Attempt Goal: LTG-Patient Will Report Improved Mood and Deny Suicidal LTG (by discharge) Patient will report improved mood and deny suicidal ideation.  Outcome: Progressing Voice he is feeling better ,wanting to go home.

## 2015-06-14 NOTE — Progress Notes (Signed)
  [facility] Adult Case Management Discharge Plan :  Will you be returning to the same living situation after discharge:  Yes,  home to his apartment At discharge, do you have transportation home?: Yes,  patient drove his truck to hospital and is in the parking lot Do you have the ability to pay for your medications: Yes,  patient has insurance  Release of information consent forms completed and in the chart;  Patient's signature needed at discharge.  Patient to Follow up at: Follow-up Information    Follow up with [facility]. Go on 06/15/2015.   Why:  For follow-up care appt Saturday 06/15/15, ACT will visit patient either Saturday or Sunday after discharge   Contact information:   [address] [place], [place] Ph [phone] Fax [phone]       Patient denies SI/HI: Yes,  patient denies SI/HI    Safety Planning and Suicide Prevention discussed: Yes,  SPE discussed with patient and patient refuses family contact for further SPE to be provided to family  Have you used any form of tobacco in the last 30 days? (Cigarettes, Smokeless Tobacco, Cigars, and/or Pipes): No  Has patient been referred to the Quitline?: N/A patient is not a smoker  Keene Breath, MSW, LCSWA 06/14/2015, 12:47 PM

## 2015-06-14 NOTE — Progress Notes (Signed)
D: Pt is awake and active in the milieu this evening. Pt mood is depressed and his affect is sad/anxious. Pt denies SI/HI and AVH at this time. Pt is c/o restlessness in legs and wanted to be sure he received the right medications, which he had.   A: Writer provided emotional support and administered medications as prescribed.  R: Pt behavior is appropriate on the unit, and he is pleasant and cooperative with staff. Pt went to bed following medication administration.

## 2015-06-14 NOTE — [facility] Suicide Risk Assessment (Signed)
[facility] Discharge Suicide Risk Assessment   Demographic Factors:  Male, Caucasian and Living alone  Total Time spent with patient: 30 minutes  Musculoskeletal: Strength & Muscle Tone: within normal limits Gait & Station: normal Patient leans: N/A  Psychiatric Specialty Exam: Physical Exam  Nursing note and vitals reviewed.   Review of Systems  All other systems reviewed and are negative.   Blood pressure 135/82, pulse 61, temperature 97.8 F (36.6 C), temperature source Oral, resp. rate 20, height 5' (1.524 m), weight 77.565 kg (171 lb), SpO2 94 %.Body mass index is 33.4 kg/(m^2).  General Appearance: Casual  Eye Contact::  Good  Speech:  Clear and [id number]  Volume:  Normal  Mood:  Euthymic  Affect:  Appropriate  Thought Process:  Goal Directed  Orientation:  Full (Time, Place, and Person)  Thought Content:  WDL  Suicidal Thoughts:  No  Homicidal Thoughts:  No  Memory:  Immediate;   Fair Recent;   Fair Remote;   Fair  Judgement:  Fair  Insight:  Fair  Psychomotor Activity:  Normal  Concentration:  Fair  Recall:  [employer] [place]  Language: Fair  Akathisia:  No  Handed:  Right  AIMS (if indicated):     Assets:  Communication Skills Desire for Improvement Financial Resources/Insurance Housing Physical Health Resilience  Sleep:  Number of Hours: 7  Cognition: WNL  ADL's:  Intact   Have you used any form of tobacco in the last 30 days? (Cigarettes, Smokeless Tobacco, Cigars, and/or Pipes): No  Has this patient used any form of tobacco in the last 30 days? (Cigarettes, Smokeless Tobacco, Cigars, and/or Pipes) No  Mental Status Per Nursing Assessment::   On Admission:  Suicidal ideation indicated by patient  Current Mental Status by Physician: NA  Loss Factors: NA  Historical Factors: Prior suicide attempts and Impulsivity  Risk Reduction Factors:   Sense of responsibility to family and Positive therapeutic relationship  Continued  Clinical Symptoms:  Depression:   Severe  Cognitive Features That Contribute To Risk:  None    Suicide Risk:  Minimal: No identifiable suicidal ideation.  Patients presenting with no risk factors but with morbid ruminations; may be classified as minimal risk based on the severity of the depressive symptoms  Principal Problem: Severe recurrent major depressive disorder with psychotic features Discharge Diagnoses:  Patient Active Problem List   Diagnosis Date Noted  . Major depression [F32.2] 06/12/2015  . Severe recurrent major depressive disorder with psychotic features [F33.3] 02/21/2015  . COPD exacerbation [J44.1] 02/19/2015  . GERD (gastroesophageal reflux disease) [K21.9] 02/19/2015  . Alcohol dependence in early full remission [F10.21] 02/18/2015  . Essential hypertension [I10] 02/18/2015  . Restless legs syndrome [G25.81] 02/18/2015      Plan Of Care/Follow-up recommendations:  Activity:  As tolerated. Diet:  Low sodium heart healthy. Other:  Keep follow-up appointments.  Is patient on multiple antipsychotic therapies at discharge:  No   Has Patient had three or more failed trials of antipsychotic monotherapy by history:  No  Recommended Plan for Multiple Antipsychotic Therapies: NA    Jolanta Pucilowska 06/14/2015, 10:58 AM

## 2015-06-14 NOTE — Progress Notes (Signed)
D: Patient stated slept good last night .Stated appetite is good and energy level  Is normal. Stated concentration is good . Stated on Depression scale , hopeless and anxiety .( low 0-10 high) Denies suicidal  homicidal ideations  .  No auditory hallucinations  No pain concerns . Appropriate ADL'S. Interacting with peers and staff.  A: Encourage patient participation with unit programming . Instruction  Given on  Medication , verbalize understanding. R: Voice no other concerns. Staff continue to monitor  

## 2015-06-14 NOTE — Discharge Summary (Signed)
Physician Discharge Summary Note  Patient:  Mathew Aguilar is an 64 y.o., male MRN:  [id number] DOB:  September 07, 1951 Patient phone:  [phone] (home)  Patient address:   [address]  [place] [address],  Total Time spent with patient: 30 minutes  Date of Admission:  06/12/2015 Date of Discharge: 06/14/2015  Reason for Admission:  Suicidal ideation.  Identifying data. Mathew Aguilar is a 64 year old male with a history of psychotic depression.  Chief complaint. "I don't want to live anymore."  History of present illness. Information was obtained from the patient and the chart. Mathew Aguilar has a long history of depression and psychosis. He has been maintained on a combination of Abilify, Effexor, Remeron. He is in the care of [facility] team. He reports good compliance with medications. In spite of good At the patient returns to the hospital complaining of symptoms of depression with poor sleep, decreased appetite, anhedonia, hopelessness worthlessness, poor energy and concentration, social isolation, crying spells that culminated in suicidal thinking with a plan to overdose on medications. He's had suicidal thoughts for the past week. He did not act on them. He denies any psychotic symptoms at present that has a history of psychotic depression. He reports heightened anxiety with frequent panic attacks. He has a history of alcoholism but has been sober for 150 days. He denies other substance use.  Past psychiatric history. Multiple hospitalizations for depression and self or substance use. He comes to the hospital regularly every 3-4 months. I doubt that he benefits from hospitalizations. 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  family psychiatric history. Nonreported.  Social history. He is disabled from mental illness. He he lives independently. He used to have lady friends all the time but no longer this is true. He is  rather isolated. He has the support from acting.  Principal Problem: Severe recurrent major depressive disorder with psychotic features Discharge Diagnoses: Patient Active Problem List   Diagnosis Date Noted  . Major depression [F32.2] 06/12/2015  . Severe recurrent major depressive disorder with psychotic features [F33.3] 02/21/2015  . COPD exacerbation [J44.1] 02/19/2015  . GERD (gastroesophageal reflux disease) [K21.9] 02/19/2015  . Alcohol dependence in early full remission [F10.21] 02/18/2015  . Essential hypertension [I10] 02/18/2015  . Restless legs syndrome [G25.81] 02/18/2015    Musculoskeletal: Strength & Muscle Tone: within normal limits Gait & Station: normal Patient leans: N/A  Psychiatric Specialty Exam: Physical Exam  Nursing note and vitals reviewed.   Review of Systems  All other systems reviewed and are negative.   Blood pressure 135/82, pulse 61, temperature 97.8 F (36.6 C), temperature source Oral, resp. rate 20, height 5' (1.524 m), weight 77.565 kg (171 lb), SpO2 94 %.Body mass index is 33.4 kg/(m^2).  See SRA.                                                  Sleep:  Number of Hours: 7   Have you used any form of tobacco in the last 30 days? (Cigarettes, Smokeless Tobacco, Cigars, and/or Pipes): No  Has this patient used any form of tobacco in the last 30 days? (Cigarettes, Smokeless Tobacco, Cigars, and/or Pipes) No  Past Medical History:  Past Medical History  Diagnosis Date  . Hypertension   . Depression   .  Restless leg     Past Surgical History  Procedure Laterality Date  . Amputation arm     Family History:  Family History  Problem Relation Age of Onset  . Family history unknown: Yes   Social History:  History  Alcohol Use No    Comment: history of ETOH abuse - sober several months     History  Drug Use No    Social History   Social History  . Marital Status: Single    Spouse Name: N/A  . Number of  Children: N/A  . Years of Education: N/A   Social History Main Topics  . Smoking status: Never Smoker   . Smokeless tobacco: Never Used  . Alcohol Use: No     Comment: history of ETOH abuse - sober several months  . Drug Use: No  . Sexual Activity: Not Currently    Birth Control/ Protection: None   Other Topics Concern  . None   Social History Narrative    Past Psychiatric History: Hospitalizations:  Outpatient Care:  Substance Abuse Care:  Self-Mutilation:  Suicidal Attempts:  Violent Behaviors:   Risk to Self: Is patient at risk for suicide?: Yes Risk to Others:   Prior Inpatient Therapy:   Prior Outpatient Therapy:    Level of Care:  OP  Hospital Course:    Mathew Aguilar is a 64 year old male with a history of depression and multiple hospitalizations admitted for suicidal thoughts.  1. Suicidal ideation. This has resolved.The patient is able to contract for safety.  2. Mood. We continued Effexor, Remeron and Abilify for depression and psychosis.  3. Hypertension. We continued atenolol and hydrochlorothiazide.   4. COPD. We continued inhalers.  5. GERD. We continued Protonix.  6. Restless leg syndrome. We continued Requip.  7. Insomnia. We offered Restoril.  8. Disposition. The patient was discharged to home. He will follow up with ACT team   Consults:  None  Significant Diagnostic Studies:  None  Discharge Vitals:   Blood pressure 135/82, pulse 61, temperature 97.8 F (36.6 C), temperature source Oral, resp. rate 20, height 5' (1.524 m), weight 77.565 kg (171 lb), SpO2 94 %. Body mass index is 33.4 kg/(m^2). Lab Results:   Results for orders placed or performed during the hospital encounter of 06/12/15 (from the past 72 hour(s))  Hemoglobin A1c     Status: None   Collection Time: 06/12/15 11:45 AM  Result Value Ref Range   Hgb A1c MFr Bld 6.0 4.0 - 6.0 %  Lipid panel, fasting     Status: Abnormal   Collection Time: 06/12/15 11:45 AM  Result Value  Ref Range   Cholesterol 155 0 - 200 mg/dL   Triglycerides 136 <150 mg/dL   HDL 30 (L) >40 mg/dL   Total CHOL/HDL Ratio 5.2 RATIO   VLDL 27 0 - 40 mg/dL   LDL Cholesterol 98 0 - 99 mg/dL    Comment:        Total Cholesterol/HDL:CHD Risk Coronary Heart Disease Risk Table                     Men   Women  1/2 Average Risk   3.4   3.3  Average Risk       5.0   4.4  2 X Average Risk   9.6   7.1  3 X Average Risk  23.4   11.0        Use the calculated Patient Ratio above and the  CHD Risk Table to determine the patient's CHD Risk.        ATP III CLASSIFICATION (LDL):  <100     mg/dL   Optimal  100-129  mg/dL   Near or Above                    Optimal  130-159  mg/dL   Borderline  160-189  mg/dL   High  >190     mg/dL   Very High   TSH     Status: None   Collection Time: 06/12/15 11:45 AM  Result Value Ref Range   TSH 3.937 0.350 - 4.500 uIU/mL    Physical Findings: AIMS: Facial and Oral Movements Muscles of Facial Expression: None, normal Lips and Perioral Area: None, normal Jaw: None, normal Tongue: None, normal,Extremity Movements Upper (arms, wrists, hands, fingers): None, normal Lower (legs, knees, ankles, toes): None, normal, Trunk Movements Neck, shoulders, hips: None, normal, Overall Severity Severity of abnormal movements (highest score from questions above): None, normal Incapacitation due to abnormal movements: None, normal Patient's awareness of abnormal movements (rate only patient's report): No Awareness, Dental Status Current problems with teeth and/or dentures?: No Does patient usually wear dentures?: Yes  CIWA:    COWS:      See Psychiatric Specialty Exam and Suicide Risk Assessment completed by Attending Physician prior to discharge.  Discharge destination:  Home  Is patient on multiple antipsychotic therapies at discharge:  No   Has Patient had three or more failed trials of antipsychotic monotherapy by history:  No    Recommended Plan for  Multiple Antipsychotic Therapies: NA  Discharge Instructions    Diet - low sodium heart healthy    Complete by:  As directed      Increase activity slowly    Complete by:  As directed             Medication List    STOP taking these medications        meclizine 12.5 MG tablet  Commonly known as:  ANTIVERT      TAKE these medications      Indication   albuterol (2.5 MG/3ML) 0.083% nebulizer solution  Commonly known as:  PROVENTIL  Inhale 3 mLs (2.5 mg total) into the lungs every 4 (four) hours as needed for wheezing or shortness of breath.      ARIPiprazole 20 MG tablet  Commonly known as:  ABILIFY  Take 1 tablet (20 mg total) by mouth daily.   Indication:  Major Depressive Disorder     atenolol 25 MG tablet  Commonly known as:  TENORMIN  Take 1 tablet (25 mg total) by mouth daily.   Indication:  High Blood Pressure     fluticasone 50 MCG/ACT nasal spray  Commonly known as:  FLONASE  Place 2 sprays into both nostrils daily as needed for rhinitis.      hydrochlorothiazide 12.5 MG tablet  Commonly known as:  HYDRODIURIL  Take 1 tablet (12.5 mg total) by mouth daily.   Indication:  High Blood Pressure     hydrOXYzine 25 MG tablet  Commonly known as:  ATARAX/VISTARIL  Take 1 tablet (25 mg total) by mouth 3 (three) times daily.   Indication:  Anxiety Neurosis     mirtazapine 45 MG tablet  Commonly known as:  REMERON  Take 1 tablet (45 mg total) by mouth at bedtime.   Indication:  Major Depressive Disorder     pantoprazole 40 MG tablet  Commonly  known as:  PROTONIX  Take 1 tablet (40 mg total) by mouth 2 (two) times daily.   Indication:  Gastroesophageal Reflux Disease     rOPINIRole 3 MG tablet  Commonly known as:  REQUIP  Take 1 tablet (3 mg total) by mouth at bedtime.   Indication:  Restless Leg Syndrome     temazepam 15 MG capsule  Commonly known as:  RESTORIL  Take 1 capsule (15 mg total) by mouth at bedtime as needed for sleep.   Indication:  Trouble  Sleeping     venlafaxine XR 150 MG 24 hr capsule  Commonly known as:  EFFEXOR-XR  Take 1 capsule (150 mg total) by mouth daily with breakfast.   Indication:  Major Depressive Disorder         Follow-up recommendations:  Activity:  as tolerated. Diet:  low sodium heart healthy. Other:  keep follow-up appointments.  Comments:    Total Discharge Time: 35 min.  Signed: Mohamud Mrozek 06/14/2015, 11:02 AM

## 2015-06-14 NOTE — Tx Team (Signed)
Interdisciplinary Treatment Plan Update (Adult)  Date:  06/14/2015 Time Reviewed:  11:59 AM  Progress in Treatment: Attending groups: Yes. Participating in groups:  Yes. Taking medication as prescribed:  Yes. Tolerating medication:  Yes. Family/Significant othe contact made:  No, will contact:  if patient provides consent. Patient has refused family involvment so far Patient understands diagnosis:  Yes. Discussing patient identified problems/goals with staff:  Yes. Medical problems stabilized or resolved:  Yes. Denies suicidal/homicidal ideation: Yes. Issues/concerns per patient self-inventory:  No. Other:  New problem(s) identified: No, Describe:  none reported  Discharge Plan or Barriers: Patient drove his truck to hosital and can drive home at discharge to his apartment and will follow [facility] once stabilized on medication  Reason for Continuation of Hospitalization: Depression Suicidal ideation  Comments:  Estimated length of stay: up to 1 day expected discharge 06/14/15  New goal(s):  Review of initial/current patient goals per problem list:   1.  Goal(s): decrease depression  Met:  Yes  Target date:06/14/15  As evidenced by: patients report of feeling better  2.  Goal (s): iliminate SI  Met:  Yes  Target date: 06/14/15  As evidenced by: patient report of no SI  3.  Goal(s): make changes with ACT team  Met:  Yes  Target date: 06/14/15  As evidenced by: ACT team reporting that different staff will work with patient   Attendees: Physician:  Orson Slick, MD 9/30/201611:59 AM  Nursing:   Elige Radon, RN 9/30/201611:59 AM  Other:  Carmell Austria, LCSWA 9/30/201611:59 AM  Other:   9/30/201611:59 AM  Other:   9/30/201611:59 AM  Other:  9/30/201611:59 AM  Other:  9/30/201611:59 AM  Other:  9/30/201611:59 AM  Other:  9/30/201611:59 AM  Other:  9/30/201611:59 AM  Other:  9/30/201611:59 AM  Other:   9/30/201611:59 AM   Scribe for Treatment Team:    Keene Breath, MSW, [place]  06/14/2015, 11:59 AM

## 2015-06-15 ENCOUNTER — Other Ambulatory Visit: Payer: Self-pay | Admitting: Psychiatry

## 2015-08-22 ENCOUNTER — Encounter: Payer: Self-pay | Admitting: *Deleted

## 2015-08-22 ENCOUNTER — Emergency Department
Admission: EM | Admit: 2015-08-22 | Discharge: 2015-08-22 | Disposition: A | Discharge status: Other Acute Inpatient Hospital | Payer: Commercial Managed Care - HMO | Attending: Emergency Medicine | Admitting: Emergency Medicine

## 2015-08-22 ENCOUNTER — Inpatient Hospital Stay
Admit: 2015-08-22 | Discharge: 2015-08-26 | DRG: 885 | Disposition: A | Discharge status: Home / Self Care | Payer: Commercial Managed Care - HMO | Source: Intra-hospital | Attending: Psychiatry | Admitting: Psychiatry

## 2015-08-22 DIAGNOSIS — F332 Major depressive disorder, recurrent severe without psychotic features: Secondary | ICD-10-CM | POA: Diagnosis not present

## 2015-08-22 DIAGNOSIS — F333 Major depressive disorder, recurrent, severe with psychotic symptoms: Secondary | ICD-10-CM | POA: Diagnosis present

## 2015-08-22 DIAGNOSIS — F1021 Alcohol dependence, in remission: Secondary | ICD-10-CM | POA: Diagnosis present

## 2015-08-22 DIAGNOSIS — Z89209 Acquired absence of unspecified upper limb, unspecified level: Secondary | ICD-10-CM

## 2015-08-22 DIAGNOSIS — F4010 Social phobia, unspecified: Secondary | ICD-10-CM | POA: Diagnosis present

## 2015-08-22 DIAGNOSIS — K219 Gastro-esophageal reflux disease without esophagitis: Secondary | ICD-10-CM | POA: Diagnosis present

## 2015-08-22 DIAGNOSIS — I10 Essential (primary) hypertension: Secondary | ICD-10-CM | POA: Diagnosis present

## 2015-08-22 DIAGNOSIS — J449 Chronic obstructive pulmonary disease, unspecified: Secondary | ICD-10-CM | POA: Diagnosis present

## 2015-08-22 DIAGNOSIS — F29 Unspecified psychosis not due to a substance or known physiological condition: Secondary | ICD-10-CM | POA: Diagnosis present

## 2015-08-22 DIAGNOSIS — N649 Disorder of breast, unspecified: Secondary | ICD-10-CM | POA: Insufficient documentation

## 2015-08-22 DIAGNOSIS — G2581 Restless legs syndrome: Secondary | ICD-10-CM | POA: Diagnosis present

## 2015-08-22 DIAGNOSIS — R45851 Suicidal ideations: Secondary | ICD-10-CM | POA: Diagnosis present

## 2015-08-22 DIAGNOSIS — G4700 Insomnia, unspecified: Secondary | ICD-10-CM | POA: Diagnosis present

## 2015-08-22 DIAGNOSIS — Z79899 Other long term (current) drug therapy: Secondary | ICD-10-CM | POA: Insufficient documentation

## 2015-08-22 DIAGNOSIS — F329 Major depressive disorder, single episode, unspecified: Secondary | ICD-10-CM | POA: Insufficient documentation

## 2015-08-22 DIAGNOSIS — F32A Depression, unspecified: Secondary | ICD-10-CM

## 2015-08-22 LAB — URINE DRUG SCREEN, QUALITATIVE (ARMC ONLY)
AMPHETAMINES, UR SCREEN: NOT DETECTED
BENZODIAZEPINE, UR SCRN: NOT DETECTED
Barbiturates, Ur Screen: NOT DETECTED
Cannabinoid 50 Ng, Ur ~~LOC~~: NOT DETECTED
Cocaine Metabolite,Ur ~~LOC~~: NOT DETECTED
MDMA (ECSTASY) UR SCREEN: NOT DETECTED
METHADONE SCREEN, URINE: NOT DETECTED
OPIATE, UR SCREEN: NOT DETECTED
Phencyclidine (PCP) Ur S: NOT DETECTED
Tricyclic, Ur Screen: NOT DETECTED

## 2015-08-22 LAB — TSH: TSH: 2.208 u[IU]/mL (ref 0.350–4.500)

## 2015-08-22 LAB — COMPREHENSIVE METABOLIC PANEL
ALBUMIN: 3.7 g/dL (ref 3.5–5.0)
ALT: 91 U/L — ABNORMAL HIGH (ref 17–63)
ANION GAP: 9 (ref 5–15)
AST: 51 U/L — AB (ref 15–41)
Alkaline Phosphatase: 48 U/L (ref 38–126)
BUN: 14 mg/dL (ref 6–20)
CHLORIDE: 108 mmol/L (ref 101–111)
CO2: 22 mmol/L (ref 22–32)
Calcium: 9.1 mg/dL (ref 8.9–10.3)
Creatinine, Ser: 0.86 mg/dL (ref 0.61–1.24)
GFR calc Af Amer: >60 mL/min (ref >60)
GFR calc non Af Amer: >60 mL/min (ref >60)
GLUCOSE: 98 mg/dL (ref 65–99)
POTASSIUM: 4.1 mmol/L (ref 3.5–5.1)
SODIUM: 139 mmol/L (ref 135–145)
TOTAL PROTEIN: 7.5 g/dL (ref 6.5–8.1)
Total Bilirubin: 0.6 mg/dL (ref 0.3–1.2)

## 2015-08-22 LAB — CBC
HEMATOCRIT: 50.3 % (ref 40.0–52.0)
HEMOGLOBIN: 16.9 g/dL (ref 13.0–18.0)
MCH: 29.2 pg (ref 26.0–34.0)
MCHC: 33.6 g/dL (ref 32.0–36.0)
MCV: 86.9 fL (ref 80.0–100.0)
Platelets: 228 10*3/uL (ref 150–440)
RBC: 5.78 MIL/uL (ref 4.40–5.90)
RDW: 13.5 % (ref 11.5–14.5)
WBC: 6.2 10*3/uL (ref 3.8–10.6)

## 2015-08-22 LAB — ACETAMINOPHEN LEVEL

## 2015-08-22 LAB — LIPID PANEL
CHOL/HDL RATIO: 6.4 ratio
CHOLESTEROL: 180 mg/dL (ref 0–200)
HDL: 28 mg/dL — ABNORMAL LOW (ref >40)
LDL Cholesterol: 109 mg/dL — ABNORMAL HIGH (ref 0–99)
TRIGLYCERIDES: 213 mg/dL — AB (ref <150)
VLDL: 43 mg/dL — AB (ref 0–40)

## 2015-08-22 LAB — ETHANOL: Alcohol, Ethyl (B): 12 mg/dL — ABNORMAL HIGH (ref <5)

## 2015-08-22 LAB — SALICYLATE LEVEL: Salicylate Lvl: <4 mg/dL (ref 2.8–30.0)

## 2015-08-22 MED ORDER — TEMAZEPAM 15 MG PO CAPS: 15.0000 mg | ORAL_CAPSULE | Freq: Every evening | ORAL | Status: DC | PRN

## 2015-08-22 MED ORDER — ARIPIPRAZOLE 10 MG PO TABS
20.0000 mg | ORAL_TABLET | Freq: Every day | ORAL | Status: DC
  Administered 2015-08-23 – 2015-08-26 (×4): 20 mg via ORAL
  Filled 2015-08-22 (×4): qty 2

## 2015-08-22 MED ORDER — VENLAFAXINE HCL ER 75 MG PO CP24
150.0000 mg | ORAL_CAPSULE | Freq: Every day | ORAL | Status: DC
  Administered 2015-08-23 – 2015-08-26 (×4): 150 mg via ORAL
  Filled 2015-08-22 (×4): qty 2

## 2015-08-22 MED ORDER — ACETAMINOPHEN 325 MG PO TABS: 650.0000 mg | ORAL_TABLET | Freq: Four times a day (QID) | ORAL | Status: DC | PRN

## 2015-08-22 MED ORDER — PANTOPRAZOLE SODIUM 40 MG PO TBEC
40.0000 mg | DELAYED_RELEASE_TABLET | Freq: Every day | ORAL | Status: DC
  Administered 2015-08-22: 40 mg via ORAL
  Filled 2015-08-22: qty 1

## 2015-08-22 MED ORDER — MIRTAZAPINE 45 MG PO TABS: 45.0000 mg | ORAL_TABLET | Freq: Every day | ORAL | Status: DC

## 2015-08-22 MED ORDER — ROPINIROLE HCL 1 MG PO TABS
3.0000 mg | ORAL_TABLET | Freq: Every day | ORAL | Status: DC
  Administered 2015-08-22 – 2015-08-25 (×4): 3 mg via ORAL
  Filled 2015-08-22 (×6): qty 3

## 2015-08-22 MED ORDER — ALBUTEROL SULFATE HFA 108 (90 BASE) MCG/ACT IN AERS: 2.0000 | INHALATION_SPRAY | RESPIRATORY_TRACT | Status: DC | PRN

## 2015-08-22 MED ORDER — ATENOLOL 25 MG PO TABS
25.0000 mg | ORAL_TABLET | Freq: Every day | ORAL | Status: DC
  Administered 2015-08-23 – 2015-08-26 (×4): 25 mg via ORAL
  Filled 2015-08-22 (×4): qty 1

## 2015-08-22 MED ORDER — ALUM & MAG HYDROXIDE-SIMETH 200-200-20 MG/5ML PO SUSP: 30.0000 mL | ORAL | Status: DC | PRN

## 2015-08-22 MED ORDER — ROPINIROLE HCL 1 MG PO TABS: 3.0000 mg | ORAL_TABLET | Freq: Every day | ORAL | Status: DC

## 2015-08-22 MED ORDER — HYDROCHLOROTHIAZIDE 12.5 MG PO CAPS
12.5000 mg | ORAL_CAPSULE | Freq: Every day | ORAL | Status: DC
  Administered 2015-08-23 – 2015-08-26 (×4): 12.5 mg via ORAL
  Filled 2015-08-22 (×4): qty 1

## 2015-08-22 MED ORDER — MIRTAZAPINE 15 MG PO TABS
45.0000 mg | ORAL_TABLET | Freq: Every day | ORAL | Status: DC
  Administered 2015-08-22 – 2015-08-25 (×4): 45 mg via ORAL
  Filled 2015-08-22 (×5): qty 3

## 2015-08-22 MED ORDER — HYDROCHLOROTHIAZIDE 12.5 MG PO CAPS
12.5000 mg | ORAL_CAPSULE | Freq: Every day | ORAL | Status: DC
  Filled 2015-08-22: qty 1

## 2015-08-22 MED ORDER — PANTOPRAZOLE SODIUM 40 MG PO TBEC
40.0000 mg | DELAYED_RELEASE_TABLET | Freq: Every day | ORAL | Status: DC
  Administered 2015-08-23 – 2015-08-26 (×4): 40 mg via ORAL
  Filled 2015-08-22 (×4): qty 1

## 2015-08-22 MED ORDER — ALBUTEROL SULFATE HFA 108 (90 BASE) MCG/ACT IN AERS
2.0000 | INHALATION_SPRAY | RESPIRATORY_TRACT | Status: DC | PRN
  Filled 2015-08-22: qty 6.7

## 2015-08-22 MED ORDER — FLUTICASONE PROPIONATE 50 MCG/ACT NA SUSP
2.0000 | Freq: Every day | NASAL | Status: DC
  Administered 2015-08-23 – 2015-08-26 (×4): 2 via NASAL
  Filled 2015-08-22: qty 16

## 2015-08-22 MED ORDER — FLUTICASONE PROPIONATE 50 MCG/ACT NA SUSP: 2.0000 | Freq: Every day | NASAL | Status: DC

## 2015-08-22 MED ORDER — MAGNESIUM HYDROXIDE 400 MG/5ML PO SUSP: 30.0000 mL | Freq: Every day | ORAL | Status: DC | PRN

## 2015-08-22 MED ORDER — ARIPIPRAZOLE 10 MG PO TABS: 20.0000 mg | ORAL_TABLET | Freq: Every day | ORAL | Status: DC

## 2015-08-22 MED ORDER — VENLAFAXINE HCL ER 150 MG PO CP24: 150.0000 mg | ORAL_CAPSULE | Freq: Every day | ORAL | Status: DC

## 2015-08-22 MED ORDER — ATENOLOL 25 MG PO TABS
25.0000 mg | ORAL_TABLET | Freq: Every day | ORAL | Status: DC
  Filled 2015-08-22: qty 1

## 2015-08-22 MED ORDER — TEMAZEPAM 15 MG PO CAPS
15.0000 mg | ORAL_CAPSULE | Freq: Every evening | ORAL | Status: DC | PRN
Start: 2015-08-22 — End: 2015-08-26

## 2015-08-22 NOTE — Progress Notes (Signed)
Admission Note:  D: Pt appeared depressed  With  a flat affect.  Pt  denies SI / AVH at this time. Voice of Major  Depression, Patient has a history of GERD, COPD and Hypertension. Patient present with suicidal thoughts  But denies at admission   is redirectable and cooperative with assessment.      A: Pt admitted to unit per protocol, skin assessment and search done and no contraband found.  Pt  educated on therapeutic milieu rules. Pt was introduced to milieu by nursing staff.    R: Pt was receptive to education about the milieu .  15 min safety checks started. Probation officer offered support

## 2015-08-22 NOTE — ED Notes (Addendum)
Pt is here with the ACT team, pt reports being depressed and suicidal, pt denies having a plan, pt denies HI,

## 2015-08-22 NOTE — Plan of Care (Signed)
Problem: Diagnosis: Increased Risk For Suicide Attempt Goal: LTG-Patient Will Show Positive Response to Medication LTG (by discharge) : Patient will show positive response to medication and will participate in the development of the discharge plan. Outcome: Progressing Denies suicidal ideations i

## 2015-08-22 NOTE — ED Provider Notes (Signed)
[facility] Emergency Department Provider Note  Time seen: 3:46 PM  I have reviewed the triage vital signs and the nursing notes.   HISTORY  Chief Complaint Suicidal    HPI Mathew Aguilar is a 64 y.o. male with a past medical history of hypertension, depression, who presents the emergency department for depression. According to the patient for the past several months he has been progressively becoming more and more depressed. Denies any acute events that it causes depression. To me the patient is stating that he is not having any thoughts of hurting himself however the patient presented with his ACT team who state the patient stated he was afraid to be alone that he might hurt himself. Denies any medical complaints today.     Past Medical History  Diagnosis Date  . Hypertension   . Depression   . Restless leg     Patient Active Problem List   Diagnosis Date Noted  . Major depression ([place]) 06/12/2015  . Severe recurrent major depressive disorder with psychotic features ([place]) 02/21/2015  . COPD exacerbation ([place]) 02/19/2015  . GERD (gastroesophageal reflux disease) 02/19/2015  . Alcohol dependence in early full remission ([place]) 02/18/2015  . Essential hypertension 02/18/2015  . Restless legs syndrome 02/18/2015    Past Surgical History  Procedure Laterality Date  . Amputation arm      Current Outpatient Rx  Name  Route  Sig  Dispense  Refill  . albuterol (PROVENTIL) (2.5 MG/3ML) 0.083% nebulizer solution   Inhalation   Inhale 3 mLs (2.5 mg total) into the lungs every 4 (four) hours as needed for wheezing or shortness of breath.   75 mL   12   . ARIPiprazole (ABILIFY) 20 MG tablet   Oral   Take 1 tablet (20 mg total) by mouth daily.   30 tablet   0   . atenolol (TENORMIN) 25 MG tablet   Oral   Take 1 tablet (25 mg total) by mouth daily.   30 tablet   0   . fluticasone (FLONASE) 50 MCG/ACT nasal spray   Each Nare   Place 2 sprays  into both nostrils daily as needed for rhinitis.          . hydrochlorothiazide (HYDRODIURIL) 12.5 MG tablet   Oral   Take 1 tablet (12.5 mg total) by mouth daily.   30 tablet   0   . hydrOXYzine (ATARAX/VISTARIL) 25 MG tablet   Oral   Take 1 tablet (25 mg total) by mouth 3 (three) times daily.   90 tablet   0   . mirtazapine (REMERON) 45 MG tablet   Oral   Take 1 tablet (45 mg total) by mouth at bedtime.   30 tablet   0   . pantoprazole (PROTONIX) 40 MG tablet   Oral   Take 1 tablet (40 mg total) by mouth 2 (two) times daily.   60 tablet   0   . rOPINIRole (REQUIP) 3 MG tablet   Oral   Take 1 tablet (3 mg total) by mouth at bedtime.   30 tablet   0   . temazepam (RESTORIL) 15 MG capsule   Oral   Take 1 capsule (15 mg total) by mouth at bedtime as needed for sleep.   30 capsule   0   . venlafaxine XR (EFFEXOR-XR) 150 MG 24 hr capsule   Oral   Take 1 capsule (150 mg total) by mouth daily with breakfast.   30  capsule   0     Allergies Review of patient's allergies indicates no known allergies.  Family History  Problem Relation Age of Onset  . Family history unknown: Yes    Social History Social History  Substance Use Topics  . Smoking status: Never Smoker   . Smokeless tobacco: Never Used  . Alcohol Use: No     Comment: history of ETOH abuse - sober several months    Review of Systems Constitutional: Negative for fever. Cardiovascular: Negative for chest pain. Respiratory: Negative for shortness of breath. Gastrointestinal: Negative for abdominal pain Musculoskeletal: Negative for back pain. Neurological: Negative for headachePsychiatric:Depression, possible thoughts of SI. 10-point ROS otherwise negative.  ____________________________________________   PHYSICAL EXAM:  VITAL SIGNS: ED Triage Vitals  Enc Vitals Group     BP 08/22/15 1340 124/62 mmHg     Pulse Rate 08/22/15 1340 66     Resp --      Temp 08/22/15 1340 97.9 F (36.6 C)      Temp Source 08/22/15 1340 Oral     SpO2 08/22/15 1340 94 %     Weight 08/22/15 1340 189 lb (85.73 kg)     Height 08/22/15 1340 5' (1.524 m)     Head Cir --      Peak Flow --      Pain Score --      Pain Loc --      Pain Edu? --      Excl. in [place]? --     Constitutional: Alert and oriented. Well appearing and in no distress. Eyes: Normal exam ENT   Head: Normocephalic and atraumatic.   Mouth/Throat: Mucous membranes are moist. Cardiovascular: Normal rate, regular rhythm. No murmur Respiratory: Normal respiratory effort without tachypnea nor retractions. Breath sounds are clear  Gastrointestinal: Soft and nontender. No distention.   Musculoskeletal: Nontender with normal range of motion in all extremities. Neurologic:  Normal speech and language. No gross focal neurologic deficits  Skin:  Skin is warm, dry and intact.  Psychiatric: Breast mood, flat affect.  ____________________________________________    INITIAL IMPRESSION / ASSESSMENT AND PLAN / ED COURSE  Pertinent labs & imaging results that were available during my care of the patient were reviewed by me and considered in my medical decision making (see chart for details).  Patient presents the emergency department with depression. To me he denies any thoughts of hurting himself or anyone else however according to the ACT team the patient has been stating suicidal thoughts, states he is afraid to be alone that he might do something to hurt himself. Given this information will place the patient under an involuntary commitment until the patient can be adequately evaluated by psychiatry.  Labs are largely at baseline.  Patient has been seen by psychiatry and they'll be admitting for further treatment. ____________________________________________   FINAL CLINICAL IMPRESSION(S) / ED DIAGNOSES  Depression Possible suicidal ideation.   Harvest Dark, MD 08/22/15 1719

## 2015-08-22 NOTE — Plan of Care (Signed)
Problem: Consults Goal: Depression Patient Education See Patient Education Module for education specifics. Outcome: Not Progressing Voice of being very depressed

## 2015-08-22 NOTE — Plan of Care (Signed)
Problem: Diagnosis: Increased Risk For Suicide Attempt Goal: STG-Patient Will Comply With Medication Regime Outcome: Progressing Patient has been compliant with his medications.

## 2015-08-22 NOTE — Progress Notes (Signed)
LCSW consulted with [facility] nurse and Juanjesus will be going to [facility] and assigned to Dr Weber Cooks as per Charge Nurse and will go to room 301

## 2015-08-22 NOTE — Tx Team (Addendum)
Initial Interdisciplinary Treatment Plan   PATIENT STRESSORS: Health problems Medication change or noncompliance   PATIENT STRENGTHS: Ability for insight Active sense of humor Capable of independent living Communication skills Physical Health   PROBLEM LIST: Problem List/Patient Goals Date to be addressed Date deferred Reason deferred Estimated date of resolution  Major Depression 08/22/15     COPD 08/22/15     Hypertension  08/22/15     Suicidal 08/22/15                                    DISCHARGE CRITERIA:  Ability to meet basic life and health needs Medical problems require only outpatient monitoring  PRELIMINARY DISCHARGE PLAN: Outpatient therapy Return to previous living arrangement  PATIENT/FAMIILY INVOLVEMENT: This treatment plan has been presented to and reviewed with the patient, [name], and/or family member,  The patient and family have been given the opportunity to ask questions and make suggestions.  [name] [name] 08/22/2015, 6:24 PM

## 2015-08-22 NOTE — ED Notes (Signed)
Pharmacy will send pts meds to [facility].

## 2015-08-22 NOTE — Progress Notes (Signed)
ED Nurse will bring all correct documentation with patient. LCSW dropped off voluntary consent document in person to [facility]. Patient was relieved to know he was going to the unit. LCSW

## 2015-08-22 NOTE — [facility] Group Notes (Signed)
[facility] Group Notes:  (Nursing/MHT/Case Management/Adjunct)  Date:  08/22/2015  Time:  9:13 PM  Type of Therapy:  Evening Wrap-up Group  Participation Level:  Did Not Attend  Participation Quality:  N/A  Affect:  N/A  Cognitive:  N/A  Insight:  None  Engagement in Group:  N/A  Modes of Intervention:  Discussion  Summary of Progress/Problems:  Mathew Aguilar 08/22/2015, 9:13 PM

## 2015-08-22 NOTE — ED Notes (Signed)
Pt presents with depression that he has had "for a long time" and feeling like it is getting worse in the last week. Pt states that he is afraid to stay alone, that he might hurt himself. Denies use of alcohol, drugs, tobacco. Pt reports that he stays in bed most of the time and is not eating well. He has family in the area that checks on him (he lives alone), but he doesn't feel like that is enough. Pt calm and cooperative.

## 2015-08-22 NOTE — Progress Notes (Signed)
D: Patient affect flat. Patient stated he was depressed. He rates his pain at an 0. He denies SI/HI/AVH at this time. He has been visible in the milieu and interacting with patients. Only complaints patient had was that he wanted the bed rails down.  A: Medication was given with education. Encouragement was provided. Put the railing down for the patient.  R: Patient was calm and cooperative with medication. Patient has been pleasant. Safety maintained with 15 min checks.
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
## 2015-08-22 NOTE — Progress Notes (Signed)
LCSW met with patient and asked him what was going on, he reported he doesn't really know why he is depressed he just is and didn't feel safe at home. In review of his Act team support and girlfriend he still was struggling to stay safe. He was unable to tell me a suicide plan but became very tearful during our interview.  LCSW supported patient and suggested rest until seen by psychiatrist he agreed.  LCSW consulted with Dr Weber Cooks who will see patient soon. Called [facility] charge nurse to see if there was a bed available she will call this worker back awaiting call

## 2015-08-23 DIAGNOSIS — F332 Major depressive disorder, recurrent severe without psychotic features: Principal | ICD-10-CM

## 2015-08-23 LAB — HEMOGLOBIN A1C: Hgb A1c MFr Bld: 6.7 % — ABNORMAL HIGH (ref 4.0–6.0)

## 2015-08-23 NOTE — Plan of Care (Signed)
Problem: Alteration in mood Goal: LTG-Pt's behavior demonstrates decreased signs of depression (Patient's behavior demonstrates decreased signs of depression to the point the patient is safe to return home and continue treatment in an outpatient setting)  Outcome: Not Progressing Presents with a flat affect and states "I don't feel well"  Continues to endorse SI

## 2015-08-23 NOTE — [facility] Group Notes (Signed)
[facility] LCSW Aftercare Discharge Planning Group Note   08/23/2015 2:01 PM  Participation Quality:  Did not attend group   Keene Breath, MSW, LCSWA

## 2015-08-23 NOTE — Plan of Care (Signed)
Problem: Alteration in mood Goal: LTG-Patient reports reduction in suicidal thoughts (Patient reports reduction in suicidal thoughts and is able to verbalize a safety plan for whenever patient is feeling suicidal)  Outcome: Progressing Denies SI     

## 2015-08-23 NOTE — [facility] Group Notes (Signed)
[facility] Group Notes:  (Nursing/MHT/Case Management/Adjunct)  Date:  08/23/2015  Time:  12:29 PM  Type of Therapy:  Psychoeducational Skills  Participation Level:  Active  Participation Quality:  Appropriate, Attentive and Sharing  Affect:  Appropriate  Cognitive:  Alert and Appropriate  Insight:  Appropriate  Engagement in Group:  Engaged  Modes of Intervention:  Discussion, Education and Support  Summary of Progress/Problems:  Adela Lank Andrena Margerum 08/23/2015, 12:29 PM

## 2015-08-23 NOTE — H&P (Signed)
Psychiatric Admission Assessment Adult  Patient Identification: Mathew Aguilar MRN:  [id number] Date of Evaluation:  08/23/2015 Chief Complaint:  major depressive disorder recurrent severe Principal Diagnosis: <principal problem not specified> Diagnosis:   Patient Active Problem List   Diagnosis Date Noted  . Severe recurrent major depression without psychotic features ([place]) [F33.2] 08/22/2015  . Major depression ([place]) [F32.9] 06/12/2015  . Severe recurrent major depressive disorder with psychotic features ([place]) [F33.3] 02/21/2015  . COPD exacerbation ([place]) [J44.1] 02/19/2015  . GERD (gastroesophageal reflux disease) [K21.9] 02/19/2015  . Alcohol dependence in early full remission ([place]) [F10.21] 02/18/2015  . Essential hypertension [I10] 02/18/2015  . Restless legs syndrome [G25.81] 02/18/2015   History of Present Illness:: Intake for this 64 year old man with long history of depression and of alcohol abuse in remission. He came into the hospital reporting that his mood had been much worse for about the last week. Sleep was poor. Energy poor. Having thoughts about wishing he were dead and feeling worthless and hopeless. Patient knew of no stressor that could've brought this on. He denied that he had been drinking and he said he had been compliant with all of his medication. Denied any new psychosocial stress. Associated Signs/Symptoms: Depression Symptoms:  depressed mood, anhedonia, hypersomnia, psychomotor retardation, feelings of worthlessness/guilt, difficulty concentrating, hopelessness, suicidal thoughts without plan, (Hypo) Manic Symptoms:  None Anxiety Symptoms:  Excessive Worry, Social Anxiety, Psychotic Symptoms:  None PTSD Symptoms: Negative Total Time spent with patient: 1 hour  Past Psychiatric History: Patient has a long-standing history of depression with a past history of more than one suicide attempt. Has had some manic like symptoms in the past but often in  the context of alcohol abuse. Currently followed by an active team who work with him closely. Lives independently. Takes medication that has been found to be helpful for his mood. He is currently maintaining longer term sobriety  Risk to Self: Is patient at risk for suicide?: No Risk to Others:   Prior Inpatient Therapy:   Prior Outpatient Therapy:    Alcohol Screening: Patient refused Alcohol Screening Tool: Yes 1. How often do you have a drink containing alcohol?: Never 2. How many drinks containing alcohol do you have on a typical day when you are drinking?: 1 or 2 3. How often do you have six or more drinks on one occasion?: Never Preliminary Score: 0 4. How often during the last year have you found that you were not able to stop drinking once you had started?: Never 5. How often during the last year have you failed to do what was normally expected from you becasue of drinking?: Never 6. How often during the last year have you needed a first drink in the morning to get yourself going after a heavy drinking session?: Never 7. How often during the last year have you had a feeling of guilt of remorse after drinking?: Never 8. How often during the last year have you been unable to remember what happened the night before because you had been drinking?: Never 9. Have you or someone else been injured as a result of your drinking?: No 10. Has a relative or friend or a doctor or another health worker been concerned about your drinking or suggested you cut down?: No Alcohol Use Disorder Identification Test Final Score (AUDIT): 0 Brief Intervention: AUDIT score less than 7 or less-screening does not suggest unhealthy drinking-brief intervention not indicated Substance Abuse History in the last 12 months:  No. Consequences of Substance Abuse:  Medical Consequences:  Patient has had suicide attempts and violent behavior and near fatal accidents all related to his alcohol abuse Previous Psychotropic  Medications: Yes  Psychological Evaluations: Yes  Past Medical History:  Past Medical History  Diagnosis Date  . Hypertension   . Depression   . Restless leg     Past Surgical History  Procedure Laterality Date  . Amputation arm     Family History:  Family History  Problem Relation Age of Onset  . Family history unknown: Yes   Family Psychiatric  History: Negative family history for any mental health problems as far as he knows Social History:  History  Alcohol Use No    Comment: history of ETOH abuse - sober several months     History  Drug Use No    Social History   Social History  . Marital Status: Single    Spouse Name: N/A  . Number of Children: N/A  . Years of Education: N/A   Social History Main Topics  . Smoking status: Never Smoker   . Smokeless tobacco: Never Used  . Alcohol Use: No     Comment: history of ETOH abuse - sober several months  . Drug Use: No  . Sexual Activity: Not Currently    Birth Control/ Protection: None   Other Topics Concern  . None   Social History Narrative   Additional Social History:                         Allergies:  No Known Allergies Lab Results:  Results for orders placed or performed during the hospital encounter of 08/22/15 (from the past 48 hour(s))  Hemoglobin A1c     Status: Abnormal   Collection Time: 08/22/15  9:30 PM  Result Value Ref Range   Hgb A1c MFr Bld 6.7 (H) 4.0 - 6.0 %  Lipid panel, fasting     Status: Abnormal   Collection Time: 08/22/15  9:30 PM  Result Value Ref Range   Cholesterol 180 0 - 200 mg/dL   Triglycerides 213 (H) <150 mg/dL   HDL 28 (L) >40 mg/dL   Total CHOL/HDL Ratio 6.4 RATIO   VLDL 43 (H) 0 - 40 mg/dL   LDL Cholesterol 109 (H) 0 - 99 mg/dL    Comment:        Total Cholesterol/HDL:CHD Risk Coronary Heart Disease Risk Table                     Men   Women  1/2 Average Risk   3.4   3.3  Average Risk       5.0   4.4  2 X Average Risk   9.6   7.1  3 X Average Risk   23.4   11.0        Use the calculated Patient Ratio above and the CHD Risk Table to determine the patient's CHD Risk.        ATP III CLASSIFICATION (LDL):  <100     mg/dL   Optimal  100-129  mg/dL   Near or Above                    Optimal  130-159  mg/dL   Borderline  160-189  mg/dL   High  >190     mg/dL   Very High   TSH     Status: None   Collection Time: 08/22/15  9:30 PM  Result Value Ref Range   TSH 2.208 0.350 - 4.500 uIU/mL    Metabolic Disorder Labs:  Lab Results  Component Value Date   HGBA1C 6.7* 08/22/2015   No results found for: PROLACTIN Lab Results  Component Value Date   CHOL 180 08/22/2015   TRIG 213* 08/22/2015   HDL 28* 08/22/2015   CHOLHDL 6.4 08/22/2015   VLDL 43* 08/22/2015   LDLCALC 109* 08/22/2015   LDLCALC 98 06/12/2015    Current Medications: Current Facility-Administered Medications  Medication Dose Route Frequency Provider Last Rate Last Dose  . acetaminophen (TYLENOL) tablet 650 mg  650 mg Oral Q6H PRN Gonzella Lex, MD      . albuterol (PROVENTIL HFA;VENTOLIN HFA) 108 (90 BASE) MCG/ACT inhaler 2 puff  2 puff Inhalation Q4H PRN Gonzella Lex, MD      . alum & mag hydroxide-simeth (MAALOX/MYLANTA) 200-200-20 MG/5ML suspension 30 mL  30 mL Oral Q4H PRN Gonzella Lex, MD      . ARIPiprazole (ABILIFY) tablet 20 mg  20 mg Oral Daily Gonzella Lex, MD   20 mg at 08/23/15 0853  . atenolol (TENORMIN) tablet 25 mg  25 mg Oral Daily Gonzella Lex, MD   25 mg at 08/23/15 [id number]  . fluticasone (FLONASE) 50 MCG/ACT nasal spray 2 spray  2 spray Each Nare Daily Gonzella Lex, MD   2 spray at 08/23/15 0854  . hydrochlorothiazide (MICROZIDE) capsule 12.5 mg  12.5 mg Oral Daily Gonzella Lex, MD   12.5 mg at 08/23/15 0852  . magnesium hydroxide (MILK OF MAGNESIA) suspension 30 mL  30 mL Oral Daily PRN Gonzella Lex, MD      . mirtazapine (REMERON) tablet 45 mg  45 mg Oral QHS Gonzella Lex, MD   45 mg at 08/23/15 2201  . pantoprazole (PROTONIX) EC  tablet 40 mg  40 mg Oral QAC breakfast Gonzella Lex, MD   40 mg at 08/23/15 0852  . rOPINIRole (REQUIP) tablet 3 mg  3 mg Oral QHS Gonzella Lex, MD   3 mg at 08/23/15 2201  . temazepam (RESTORIL) capsule 15 mg  15 mg Oral QHS PRN Gonzella Lex, MD      . venlafaxine XR (EFFEXOR-XR) 24 hr capsule 150 mg  150 mg Oral Q breakfast Gonzella Lex, MD   150 mg at 08/23/15 [id number]   PTA Medications: Prescriptions prior to admission  Medication Sig Dispense Refill Last Dose  . albuterol (PROVENTIL) (2.5 MG/3ML) 0.083% nebulizer solution Inhale 3 mLs (2.5 mg total) into the lungs every 4 (four) hours as needed for wheezing or shortness of breath. 75 mL 12   . ARIPiprazole (ABILIFY) 20 MG tablet Take 1 tablet (20 mg total) by mouth daily. 30 tablet 0   . atenolol (TENORMIN) 25 MG tablet Take 1 tablet (25 mg total) by mouth daily. 30 tablet 0   . fluticasone (FLONASE) 50 MCG/ACT nasal spray Place 2 sprays into both nostrils daily as needed for rhinitis.    02/18/2015 at Unknown time  . hydrochlorothiazide (HYDRODIURIL) 12.5 MG tablet Take 1 tablet (12.5 mg total) by mouth daily. 30 tablet 0   . hydrOXYzine (ATARAX/VISTARIL) 25 MG tablet Take 1 tablet (25 mg total) by mouth 3 (three) times daily. 90 tablet 0   . mirtazapine (REMERON) 45 MG tablet Take 1 tablet (45 mg total) by mouth at bedtime. 30 tablet 0   . pantoprazole (PROTONIX) 40 MG tablet Take 1 tablet (40  mg total) by mouth 2 (two) times daily. 60 tablet 0   . rOPINIRole (REQUIP) 3 MG tablet Take 1 tablet (3 mg total) by mouth at bedtime. 30 tablet 0   . temazepam (RESTORIL) 15 MG capsule Take 1 capsule (15 mg total) by mouth at bedtime as needed for sleep. 30 capsule 0   . venlafaxine XR (EFFEXOR-XR) 150 MG 24 hr capsule Take 1 capsule (150 mg total) by mouth daily with breakfast. 30 capsule 0     Musculoskeletal: Strength & Muscle Tone: within normal limits Gait & Station: normal Patient leans: N/A  Psychiatric Specialty Exam: Physical Exam   Nursing note and vitals reviewed. Constitutional: He appears well-developed and well-nourished.  HENT:  Head: Normocephalic and atraumatic.  Eyes: Conjunctivae are normal. Pupils are equal, round, and reactive to light.  Neck: Normal range of motion.  Cardiovascular: Normal rate, regular rhythm and normal heart sounds.   Respiratory: Effort normal and breath sounds normal. No respiratory distress.  GI: Soft.  Musculoskeletal: Normal range of motion.  Neurological: He is alert.  Skin: Skin is warm and dry.  Psychiatric: Judgment and thought content normal. His speech is delayed. He is slowed. Cognition and memory are normal. He exhibits a depressed mood.    Review of Systems  Constitutional: Negative.   HENT: Negative.   Eyes: Negative.   Respiratory: Negative.   Cardiovascular: Negative.   Gastrointestinal: Negative.   Musculoskeletal: Negative.   Skin: Negative.   Neurological: Negative.   Psychiatric/Behavioral: Positive for depression. Negative for suicidal ideas, hallucinations and substance abuse. The patient is nervous/anxious. The patient does not have insomnia.     Blood pressure 142/88, pulse 61, temperature 98.2 F (36.8 C), temperature source Oral, resp. rate 20, height 5' (1.524 m), weight 79.833 kg (176 lb), SpO2 98 %.Body mass index is 34.37 kg/(m^2).  General Appearance: Disheveled  Eye Sport and exercise psychologist::  Fair  Speech:  Slow  Volume:  Decreased  Mood:  Depressed  Affect:  Flat  Thought Process:  Linear  Orientation:  Full (Time, Place, and Person)  Thought Content:  Negative  Suicidal Thoughts:  Yes.  without intent/plan  Homicidal Thoughts:  No  Memory:  Immediate;   Fair Recent;   Fair Remote;   Fair  Judgement:  Fair  Insight:  Fair  Psychomotor Activity:  Decreased  Concentration:  Fair  Recall:  [employer] of Knowledge:Fair  Language: Fair  Akathisia:  No  Handed:  Left  AIMS (if indicated):     Assets:  Communication Skills Desire for  Improvement Housing Resilience Social Support  ADL's:  Intact  Cognition: WNL  Sleep:        Treatment Plan Summary: Daily contact with patient to assess and evaluate symptoms and progress in treatment, Medication management and Plan Patient has been continued on his usual medication for depression including Effexor and Remeron. Also his medicine for his blood pressure gastric reflux and COPD. He reports to me that his energy level is starting to feel little better. He is less hopeless. Still has passive suicidal thoughts but it is getting better. He has had no behavior problems on the unit. Compliant with medicine. Did some supportive counseling and tried to help him to think of what might of sedative into this depression. Reviewed medication with him. Encouraged his continue participation in groups. He ready for discharge within the next couple days  Observation Level/Precautions:  15 minute checks  Laboratory:  Chemistry Profile  Psychotherapy:  A individual and group  psychotherapy   Medications:  For now continue current medications including venlafaxine and Remeron   Consultations:  None at this time   Discharge Concerns:  Making sure his act team is aware of his disposition   Estimated LOS: 2-3 days   Other:     I certify that inpatient services furnished can reasonably be expected to improve the patient's condition.   John Clapacs 12/9/201610:55 PM

## 2015-08-23 NOTE — BHH Group Notes (Signed)
[place] Group Notes:  (Nursing/MHT/Case Management/Adjunct)  Date:  08/23/2015  Time:  9:50 PM  Type of Therapy:  Group Therapy  Participation Level:  Active  Participation Quality:  Appropriate  Affect:  Appropriate  Cognitive:  Appropriate  Insight:  Appropriate  Engagement in Group:  Supportive  Modes of Intervention:  Support  Summary of Progress/Problems:  Mathew Aguilar 08/23/2015, 9:50 PM

## 2015-08-23 NOTE — BHH Suicide Risk Assessment (Signed)
[facility] Building Admission Suicide Risk Assessment   Nursing information obtained from:    Demographic factors:    Current Mental Status:    Loss Factors:    Historical Factors:    Risk Reduction Factors:    Total Time spent with patient: 1 hour Principal Problem: Severe recurrent major depression without psychotic features [facility]) Diagnosis:   Patient Active Problem List   Diagnosis Date Noted  . Severe recurrent major depression without psychotic features ([place]) [F33.2] 08/22/2015  . Major depression ([place]) [F32.9] 06/12/2015  . Severe recurrent major depressive disorder with psychotic features ([place]) [F33.3] 02/21/2015  . COPD exacerbation ([place]) [J44.1] 02/19/2015  . GERD (gastroesophageal reflux disease) [K21.9] 02/19/2015  . Alcohol dependence in early full remission ([place]) [F10.21] 02/18/2015  . Essential hypertension [I10] 02/18/2015  . Restless legs syndrome [G25.81] 02/18/2015     Continued Clinical Symptoms:  Alcohol Use Disorder Identification Test Final Score (AUDIT): 0 The "Alcohol Use Disorders Identification Test", Guidelines for Use in Primary Care, Second Edition.  World Pharmacologist [facility]). Score between 0-7:  no or low risk or alcohol related problems. Score between 8-15:  moderate risk of alcohol related problems. Score between 16-19:  high risk of alcohol related problems. Score 20 or above:  warrants further diagnostic evaluation for alcohol dependence and treatment.   CLINICAL FACTORS:   Depression:   Anhedonia Hopelessness   Musculoskeletal: Strength & Muscle Tone: within normal limits Gait & Station: normal Patient leans: N/A  Psychiatric Specialty Exam: Physical Exam  Nursing note and vitals reviewed. Constitutional: He appears well-developed and well-nourished.  HENT:  Head: Normocephalic and atraumatic.  Eyes: Conjunctivae are normal. Pupils are equal, round, and reactive to light.  Neck: Normal range of motion.  Cardiovascular: Normal heart sounds.    Respiratory: Effort normal.  GI: Soft.  Musculoskeletal: Normal range of motion.  Neurological: He is alert.  Skin: Skin is warm and dry.    ROS  Blood pressure 142/88, pulse 61, temperature 98.2 F (36.8 C), temperature source Oral, resp. rate 20, height 5' (1.524 m), weight 79.833 kg (176 lb), SpO2 98 %.Body mass index is 34.37 kg/(m^2).  General Appearance: Disheveled  Eye Contact::  Good  Speech:  Slow  Volume:  Decreased  Mood:  Dysphoric  Affect:  Blunt  Thought Process:  Coherent  Orientation:  Full (Time, Place, and Person)  Thought Content:  Negative  Suicidal Thoughts:  Yes.  without intent/plan  Homicidal Thoughts:  No  Memory:  Immediate;   Fair Recent;   Fair Remote;   Fair  Judgement:  Fair  Insight:  Fair  Psychomotor Activity:  Normal  Concentration:  Fair  Recall:  [employer] of Knowledge:Fair  Language: Fair  Akathisia:  No  Handed:  Left  AIMS (if indicated):     Assets:  Financial Resources/Insurance Housing Resilience Social Support  Sleep:     Cognition: WNL  ADL's:  Intact     COGNITIVE FEATURES THAT CONTRIBUTE TO RISK:  Thought constriction (tunnel vision)    SUICIDE RISK:   Mild:  Suicidal ideation of limited frequency, intensity, duration, and specificity.  There are no identifiable plans, no associated intent, mild dysphoria and related symptoms, good self-control (both objective and subjective assessment), few other risk factors, and identifiable protective factors, including available and accessible social support.  PLAN OF CARE: Patient has a past history of suicide attempt which put him at chronically elevated risk. He is also a single older man with minimal family support. Patient is  getting appropriate outpatient care and has a good relationship with his outpatient providers. Continue current medicines for depression as well as 15 minute checks. Continue involvement in the unit milieu and working towards appropriate discharge  planning  Medical Decision Making:  Established Problem, Worsening (2) and Review of Medication Regimen & Side Effects (2)  I certify that inpatient services furnished can reasonably be expected to improve the patient's condition.   John Clapacs 08/23/2015, 11:01 PM

## 2015-08-23 NOTE — Progress Notes (Signed)
Recreation Therapy Notes  INPATIENT RECREATION THERAPY ASSESSMENT  Patient Details Name: Mathew Aguilar MRN: [id number] DOB: Feb 05, 1951 Today's Date: 08/23/2015  Patient Stressors:  Patient reported no stressors.  Coping Skills:   Isolate, Avoidance, Exercise, Art/Dance, Talking, Music, Sports  Personal Challenges: Anger, Concentration, Decision-Making, Expressing Yourself, Problem-Solving, Self-Esteem/Confidence, Stress Management, Trusting Others  Leisure Interests (2+):  Individual - Other (Comment) (Talk to people, exercise)  Awareness of Community Resources:  Yes  Community Resources:  [place]  Current Use: Yes  If no, Barriers?:    Patient Strengths:  "I don't love nothing"  Patient Identified Areas of Improvement:  Not coming back to the hospital  Current Recreation Participation:  Nothing  Patient Goal for Hospitalization:  To get out  [place] of Residence:  [place] of Residence:  [place]   Current SI (including self-harm):  No  Current HI:  No  Consent to Intern Participation: N/A   Leonette Monarch, LRT/CTRS 08/23/2015, 5:46 PM

## 2015-08-23 NOTE — Progress Notes (Signed)
Recreation Therapy Notes  Date: 12.09.16 Time: 3:10 pm Location: Craft Room  Group Topic: Coping Skills  Goal Area(s) Addresses:  Patient will participate in coping skill. Patient will verbalize benefit of using art as a coping skill.  Behavioral Response: Did not attend  Intervention: Coloring  Activity: Patients were given coloring sheets and instructed to color and think about what emotions they were experiencing and what they are focused on.  Education: LRT educated patients on healthy coping skills.  Education Outcome: Patient did not attend group.   Clinical Observations/Feedback: Patient did not attend group.  Leonette Monarch, LRT/CTRS 08/23/2015 4:33 PM

## 2015-08-24 NOTE — BHH Group Notes (Signed)
[place] LCSW Group Therapy  08/24/2015 1:55 PM  Type of Therapy:  Group Therapy  Participation Level:  Minimal  Participation Quality:  Attentive and Supportive  Affect:  Appropriate and Flat  Cognitive:  Appropriate  Insight:  Limited  Engagement in Therapy:  Limited  Modes of Intervention:  Education, Problem-solving, Socialization and Support  Summary of Progress/Problems:  August Saucer, MSW, LCSW 08/24/2015, 1:55 PM

## 2015-08-24 NOTE — Progress Notes (Signed)
D: Pt denies SI/HI/AVH. Pt is pleasant and cooperative. Patient's affect is flat but brightens upon approach, he appears less anxious and he is interacting with peers and staff appropriately.  A: Pt was offered support and encouragement. Pt was given scheduled medications. Pt was encouraged to attend groups. Q 15 minute checks were done for safety.  R:Pt attends groups and interacts well with peers and staff. Pt is taking medication. Pt has no complaints.Pt receptive to treatment and safety maintained on unit.

## 2015-08-24 NOTE — Plan of Care (Signed)
Problem: Alteration in mood Goal: LTG-Patient reports reduction in suicidal thoughts (Patient reports reduction in suicidal thoughts and is able to verbalize a safety plan for whenever patient is feeling suicidal)  Outcome: Progressing Patient denies SI/HI.      

## 2015-08-24 NOTE — Plan of Care (Signed)
Problem: Alteration in mood Goal: LTG-Pt's behavior demonstrates decreased signs of depression (Patient's behavior demonstrates decreased signs of depression to the point the patient is safe to return home and continue treatment in an outpatient setting)  Outcome: Progressing Verbalizes that he is starting to feel better and rates his depression as a 5/10.   Affect brighter.  Smiles on occasion and out of room up to dayroom.

## 2015-08-24 NOTE — BHH Group Notes (Signed)
[place] Group Notes:  (Nursing/MHT/Case Management/Adjunct)  Date:  08/24/2015  Time:  10:53 AM  Type of Therapy:  Psychoeducational Skills  Participation Level:  Active  Participation Quality:  Appropriate  Affect:  Appropriate  Cognitive:  Appropriate  Insight:  Appropriate  Engagement in Group:  Engaged and Supportive  Modes of Intervention:  Education and Support  Summary of Progress/Problems:  [employer] 08/24/2015, 10:53 AM

## 2015-08-24 NOTE — Progress Notes (Signed)
[facility] MD Progress Note  08/24/2015 12:17 PM Mathew Aguilar  MRN:  [id number] Subjective:    Patient is a 64 year old man with long history of depression and of alcohol abuse who was seen for follow-up.  He was admitted due to worsening depression for the past one week. He reported that he has been having severe depression poor energy and anhedonia and having thoughts of suicide. He reported feeling worthless and hopeless.. Patient knew of no stressor that could've brought this on. He denied that he had been drinking and he said he had been compliant with all of his medication. Denied any new psychosocial stress.   Associated Signs/Symptoms: Depression Symptoms: depressed mood, anhedonia, hypersomnia, psychomotor retardation, feelings of worthlessness/guilt, difficulty concentrating, hopelessness, suicidal thoughts without plan, (Hypo) Manic Symptoms: None Anxiety Symptoms: Excessive Worry, Social Anxiety, Psychotic Symptoms: None PTSD Symptoms: Negative   Past Psychiatric History: [facility]. He reported that he does not know the name of his current psychiatrist. Risk to Self: Is patient at risk for suicide?: No Risk to Others:   Prior Inpatient Therapy:   Prior Outpatient Therapy:     Principal Problem: Severe recurrent major depression without psychotic features ([place]) Diagnosis:   Patient Active Problem List   Diagnosis Date Noted  . Severe recurrent major depression without psychotic features ([place]) [F33.2] 08/22/2015  . Major depression ([place]) [F32.9] 06/12/2015  . Severe recurrent major depressive disorder with psychotic features ([place]) [F33.3] 02/21/2015  . COPD exacerbation ([place]) [J44.1] 02/19/2015  . GERD (gastroesophageal reflux disease) [K21.9] 02/19/2015  . Alcohol dependence in early full remission ([place]) [F10.21] 02/18/2015  . Essential hypertension [I10] 02/18/2015  . Restless legs syndrome [G25.81] 02/18/2015   Total Time  spent with patient: 30 minutes    Past Medical History:  Past Medical History  Diagnosis Date  . Hypertension   . Depression   . Restless leg     Past Surgical History  Procedure Laterality Date  . Amputation arm     Family History:  Family History  Problem Relation Age of Onset  . Family history unknown: Yes   Family Psychiatric  History:  Social History:  History  Alcohol Use No    Comment: history of ETOH abuse - sober several months     History  Drug Use No    Social History   Social History  . Marital Status: Single    Spouse Name: N/A  . Number of Children: N/A  . Years of Education: N/A   Social History Main Topics  . Smoking status: Never Smoker   . Smokeless tobacco: Never Used  . Alcohol Use: No     Comment: history of ETOH abuse - sober several months  . Drug Use: No  . Sexual Activity: Not Currently    Birth Control/ Protection: None   Other Topics Concern  . None   Social History Narrative   Additional Social History:                         Sleep: Fair  Appetite:  Fair  Current Medications: Current Facility-Administered Medications  Medication Dose Route Frequency Provider Last Rate Last Dose  . acetaminophen (TYLENOL) tablet 650 mg  650 mg Oral Q6H PRN Gonzella Lex, MD      . albuterol (PROVENTIL HFA;VENTOLIN HFA) 108 (90 BASE) MCG/ACT inhaler 2 puff  2 puff Inhalation Q4H PRN Gonzella Lex, MD      .  alum & mag hydroxide-simeth (MAALOX/MYLANTA) 200-200-20 MG/5ML suspension 30 mL  30 mL Oral Q4H PRN Gonzella Lex, MD      . ARIPiprazole (ABILIFY) tablet 20 mg  20 mg Oral Daily Gonzella Lex, MD   20 mg at 08/24/15 0855  . atenolol (TENORMIN) tablet 25 mg  25 mg Oral Daily Gonzella Lex, MD   25 mg at 08/24/15 0858  . fluticasone (FLONASE) 50 MCG/ACT nasal spray 2 spray  2 spray Each Nare Daily Gonzella Lex, MD   2 spray at 08/24/15 0856  . hydrochlorothiazide (MICROZIDE) capsule 12.5 mg  12.5 mg Oral Daily Gonzella Lex, MD   12.5 mg at 08/24/15 0855  . magnesium hydroxide (MILK OF MAGNESIA) suspension 30 mL  30 mL Oral Daily PRN Gonzella Lex, MD      . mirtazapine (REMERON) tablet 45 mg  45 mg Oral QHS Gonzella Lex, MD   45 mg at 08/23/15 2201  . pantoprazole (PROTONIX) EC tablet 40 mg  40 mg Oral QAC breakfast Gonzella Lex, MD   40 mg at 08/24/15 0855  . rOPINIRole (REQUIP) tablet 3 mg  3 mg Oral QHS Gonzella Lex, MD   3 mg at 08/23/15 2201  . temazepam (RESTORIL) capsule 15 mg  15 mg Oral QHS PRN Gonzella Lex, MD      . venlafaxine XR (EFFEXOR-XR) 24 hr capsule 150 mg  150 mg Oral Q breakfast Gonzella Lex, MD   150 mg at 08/24/15 [id number]    Lab Results:  Results for orders placed or performed during the hospital encounter of 08/22/15 (from the past 48 hour(s))  Hemoglobin A1c     Status: Abnormal   Collection Time: 08/22/15  9:30 PM  Result Value Ref Range   Hgb A1c MFr Bld 6.7 (H) 4.0 - 6.0 %  Lipid panel, fasting     Status: Abnormal   Collection Time: 08/22/15  9:30 PM  Result Value Ref Range   Cholesterol 180 0 - 200 mg/dL   Triglycerides 213 (H) <150 mg/dL   HDL 28 (L) >40 mg/dL   Total CHOL/HDL Ratio 6.4 RATIO   VLDL 43 (H) 0 - 40 mg/dL   LDL Cholesterol 109 (H) 0 - 99 mg/dL    Comment:        Total Cholesterol/HDL:CHD Risk Coronary Heart Disease Risk Table                     Men   Women  1/2 Average Risk   3.4   3.3  Average Risk       5.0   4.4  2 X Average Risk   9.6   7.1  3 X Average Risk  23.4   11.0        Use the calculated Patient Ratio above and the CHD Risk Table to determine the patient's CHD Risk.        ATP III CLASSIFICATION (LDL):  <100     mg/dL   Optimal  100-129  mg/dL   Near or Above                    Optimal  130-159  mg/dL   Borderline  160-189  mg/dL   High  >190     mg/dL   Very High   TSH     Status: None   Collection Time: 08/22/15  9:30 PM  Result Value Ref Range  TSH 2.208 0.350 - 4.500 uIU/mL    Physical Findings: AIMS:  ,  ,  ,  ,    CIWA:  CIWA-Ar Total: 0 COWS:     Musculoskeletal: Strength & Muscle Tone: within normal limits Gait & Station: normal Patient leans: N/A  Psychiatric Specialty Exam: Review of Systems  Psychiatric/Behavioral: Positive for depression.  All other systems reviewed and are negative.   Blood pressure 125/79, pulse 78, temperature 98.2 F (36.8 C), temperature source Oral, resp. rate 20, height 5' (1.524 m), weight 176 lb (79.833 kg), SpO2 98 %.Body mass index is 34.37 kg/(m^2).  General Appearance: Casual  Eye Contact::  Fair  Speech:  Slow  Volume:  Decreased  Mood:  Depressed  Affect:  Congruent  Thought Process:  Coherent  Orientation:  Full (Time, Place, and Person)  Thought Content:  WDL  Suicidal Thoughts:  No  Homicidal Thoughts:  No  Memory:  Immediate;   Fair  Judgement:  Fair  Insight:  Fair  Psychomotor Activity:  Psychomotor Retardation  Concentration:  Fair  Recall:  [employer] of Knowledge:Fair  Language: Fair  Akathisia:  No  Handed:  Left  AIMS (if indicated):     Assets:  Communication Skills Desire for Improvement Social Support  ADL's:  Intact  Cognition: WNL  Sleep:  Number of Hours: 5.5   Treatment Plan Summary: Daily contact with patient to assess and evaluate symptoms and progress in treatment and Medication management    Daily contact with patient to assess and evaluate symptoms and progress in treatment, Medication management and Plan Patient has been continued on his usual medication for depression including Effexor and Remeron.   Also his medicine for his blood pressure gastric reflux and COPD.  Encouraged his continue participation in groups. He ready for discharge within the next couple days  Observation Level/Precautions: 15 minute checks  Laboratory: Chemistry Profile  Psychotherapy: A individual and group psychotherapy   Medications: For now continue current medications including venlafaxine and Remeron    Consultations: None at this time   Discharge Concerns: Making sure his act team is aware of his disposition   Estimated LOS: 2-3 days   Other:          Rainey Pines 08/24/2015, 12:17 PM

## 2015-08-24 NOTE — BHH Counselor (Signed)
Adult Comprehensive Assessment  Patient ID: Mathew Aguilar, male   DOB: [birth year], 64 y.o.   MRN: [id number]  Information Source: Information source: Patient  Current Stressors:  Family Relationships: distant family Financial / Lack of resources (include bankruptcy): Limited income Housing / Lack of housing: has appropriate housing but unhappy at not being able to meet people the way he would like. Substance abuse: history of, says it;s been 233 days since last drink.  Living/Environment/Situation:  Living Arrangements: Alone Living conditions (as described by patient or guardian): nice apartment, says it has gotten messy because he did not feel like cleaning or getting out of bed for a week How long has patient lived in current situation?: many years. What is atmosphere in current home: Comfortable, Supportive (Pt seems to crave more friendship and social contacts then he is getting.)  Family History:  Marital status: Divorced Divorced, when?: 2006 Additional relationship information: has a girlfriend name [name] that lives in [place] that he sees frequently. What is your sexual orientation?: heterosexual Has your sexual activity been affected by drugs, alcohol, medication, or emotional stress?: no Does patient have children?: Yes How many children?: 1 How is patient's relationship with their children?: Son, 24  Childhood History:  By whom was/is the patient raised?: Both parents Description of patient's relationship with caregiver when they were a child: describes parents as good people Patient's description of current relationship with people who raised him/her: Parents are both deceased. How were you disciplined when you got in trouble as a child/adolescent?: whooping, he says the ones he got he needed Does patient have siblings?: Yes Number of Siblings: 3 Description of patient's current relationship with siblings: brother, two sisters, [place]  Did patient suffer any  verbal/emotional/physical/sexual abuse as a child?: No Did patient suffer from severe childhood neglect?: No Has patient ever been sexually abused/assaulted/raped as an adolescent or adult?: No Was the patient ever a victim of a crime or a disaster?: No Witnessed domestic violence?: No Has patient been effected by domestic violence as an adult?: No  Education:  Highest grade of school patient has completed: 8th grade Currently a student?: No Learning disability?: Yes  Employment/Work Situation:   Employment situation: On disability Why is patient on disability: mental health How long has patient been on disability: 43 Patient's job has been impacted by current illness: Yes Describe how patient's job has been impacted: Pt unable to work after self inflicted injury to his arm, due to mental illness. 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, tree work Has patient ever been in the [employer]?: No Has patient ever served in combat?: No Did You Receive Any Psychiatric Treatment/Services While in Passenger transport manager?: No Are There Guns or Chiropractor in [place]?: No Are These Psychologist, educational?:  (N/A)  Financial Resources:   Financial resources: Teacher, early years/pre, Medicare Does patient have a Programmer, applications or guardian?: No  Alcohol/Substance Abuse:   What has been your use of drugs/alcohol within the last 12 months?: 233 days since last drink-says he just prayed and asked God to help him not want to drink anymore. If attempted suicide, did drugs/alcohol play a role in this?: No Alcohol/Substance Abuse Treatment Hx: Past Tx, Outpatient Has alcohol/substance abuse ever caused legal problems?: No  Social Support System:   Describe Community Support System: [facility] Type of faith/religion: none How does patient's faith help to cope with current illness?: none  Leisure/Recreation:   Leisure and Hobbies: meeting people,  talking  Strengths/Needs:   What things does  the patient do well?: help people. help neighbor who's 30 going to grocery or doctor, take her mail. In what areas does patient struggle / problems for patient: financially  Discharge Plan:   Does patient have access to transportation?: Yes Will patient be returning to same living situation after discharge?: Yes Currently receiving community mental health services: Yes (From Whom) ([facility]) If no, would patient like referral for services when discharged?: No Does patient have financial barriers related to discharge medications?: No  Summary/Recommendations:    Zarif has been a patient here at [facility] many times.  He comes complaining of increased depression which he says had been going on for a week or so.  He verbalizes that he noticed changes in appetite and did not want to get out of bed.  He says he was neglecting his typical household chores and his house is a mess now.  Pt is followed by [facility]  They have been working with him for many years.  Pt presents in plain clothes, does not appear disheveled or malodorous.   He verbalizes that he would like more social contacts where he lives or get involved with other things like the senior center and church groups.  He has said these things in the past but has not followed up in making those connections.  While on the unit Pt will have the opportunity to participate in groups and therapeutic milieu. He will have medications managed and assistance with appropriate discharge planning.  August Saucer 08/24/2015, MSW, LCSW

## 2015-08-24 NOTE — Progress Notes (Signed)
D:  Patient denies SI/HI/AVH.  Presents with sad, flat affect but brightens on approach.  Rates depression as a 5/10.  Visible in milieu and interacting with peers and staff appropriately. A:  Support and encouragement offered.  Medication given per orders.  Encouraged to attend groups and verbalize feelings.  Safety maintained.   R:  Patient receptive to treatment plan.  Medication and group compliant.

## 2015-08-24 NOTE — BHH Group Notes (Signed)
[facility] Group Notes:  (Nursing/MHT/Case Management/Adjunct)  Date:  08/24/2015  Time:  9:07 PM  Type of Therapy:  Group Therapy  Participation Level:  Active  Participation Quality:  Appropriate, Attentive and Sharing  Affect:  Appropriate  Cognitive:  Appropriate  Insight:  Appropriate  Engagement in Group:  Engaged  Modes of Intervention:  Support  Summary of Progress/Problems:  Mathew Aguilar 08/24/2015, 9:07 PM

## 2015-08-25 NOTE — BHH Group Notes (Signed)
[facility] Group Notes:  (Nursing/MHT/Case Management/Adjunct)  Date:  08/25/2015  Time:  9:29 PM  Type of Therapy:  Group Therapy  Participation Level:  Minimal  Participation Quality:  Attentive  Affect:  Flat  Cognitive:  Alert  Insight:  Limited  Engagement in Group:  Limited  Modes of Intervention:  n/a  Summary of Progress/Problems:  Mathew Aguilar 08/25/2015, 9:29 PM

## 2015-08-25 NOTE — Progress Notes (Signed)
[facility] MD Progress Note  08/25/2015 10:39 AM Mathew Aguilar  MRN:  [id number] Subjective:    Patient is a 64 year old man with long history of depression and alcohol abuse who was seen for follow-up.  He was admitted due to worsening depression for the past one week. He reported that he is doing well. Patient reported that he woke up around 3 AM yesterday but was able to sleep back. He appeared calm and cooperative during the interview. He reported that he is improving on his current medications. He currently denied having any adverse effects of the medications. Staff reported that he seems to be improving on his medications. He denied having any perceptual disturbances. He denied having any suicidal ideations or plans.   Associated Signs/Symptoms: Depression Symptoms: depressed mood, anhedonia, hypersomnia, psychomotor retardation, feelings of worthlessness/guilt, difficulty concentrating, hopelessness, suicidal thoughts without plan, (Hypo) Manic Symptoms: None Anxiety Symptoms: Excessive Worry, Social Anxiety, Psychotic Symptoms: None PTSD Symptoms: Negative   Past Psychiatric History: [facility]. He reported that he does not know the name of his current psychiatrist. Risk to Self: Is patient at risk for suicide?: No Risk to Others:   Prior Inpatient Therapy:   Prior Outpatient Therapy:     Principal Problem: Severe recurrent major depression without psychotic features ([place]) Diagnosis:   Patient Active Problem List   Diagnosis Date Noted  . Severe recurrent major depression without psychotic features ([place]) [F33.2] 08/22/2015  . Major depression ([place]) [F32.9] 06/12/2015  . Severe recurrent major depressive disorder with psychotic features ([place]) [F33.3] 02/21/2015  . COPD exacerbation ([place]) [J44.1] 02/19/2015  . GERD (gastroesophageal reflux disease) [K21.9] 02/19/2015  . Alcohol dependence in early full remission ([place]) [F10.21] 02/18/2015   . Essential hypertension [I10] 02/18/2015  . Restless legs syndrome [G25.81] 02/18/2015   Total Time spent with patient: 30 minutes    Past Medical History:  Past Medical History  Diagnosis Date  . Hypertension   . Depression   . Restless leg     Past Surgical History  Procedure Laterality Date  . Amputation arm     Family History:  Family History  Problem Relation Age of Onset  . Family history unknown: Yes   Family Psychiatric  History:  Social History:  History  Alcohol Use No    Comment: history of ETOH abuse - sober several months     History  Drug Use No    Social History   Social History  . Marital Status: Single    Spouse Name: N/A  . Number of Children: N/A  . Years of Education: N/A   Social History Main Topics  . Smoking status: Never Smoker   . Smokeless tobacco: Never Used  . Alcohol Use: No     Comment: history of ETOH abuse - sober several months  . Drug Use: No  . Sexual Activity: Not Currently    Birth Control/ Protection: None   Other Topics Concern  . None   Social History Narrative   Additional Social History:          patient currently lives by himself.                Sleep: Fair  Appetite:  Fair  Current Medications: Current Facility-Administered Medications  Medication Dose Route Frequency Provider Last Rate Last Dose  . acetaminophen (TYLENOL) tablet 650 mg  650 mg Oral Q6H PRN Gonzella Lex, MD      . albuterol (PROVENTIL HFA;VENTOLIN HFA) 108 (  90 BASE) MCG/ACT inhaler 2 puff  2 puff Inhalation Q4H PRN Gonzella Lex, MD      . alum & mag hydroxide-simeth (MAALOX/MYLANTA) 200-200-20 MG/5ML suspension 30 mL  30 mL Oral Q4H PRN Gonzella Lex, MD      . ARIPiprazole (ABILIFY) tablet 20 mg  20 mg Oral Daily Gonzella Lex, MD   20 mg at 08/25/15 0815  . atenolol (TENORMIN) tablet 25 mg  25 mg Oral Daily Gonzella Lex, MD   25 mg at 08/25/15 0816  . fluticasone (FLONASE) 50 MCG/ACT nasal spray 2 spray  2 spray  Each Nare Daily Gonzella Lex, MD   2 spray at 08/25/15 0816  . hydrochlorothiazide (MICROZIDE) capsule 12.5 mg  12.5 mg Oral Daily Gonzella Lex, MD   12.5 mg at 08/25/15 0816  . magnesium hydroxide (MILK OF MAGNESIA) suspension 30 mL  30 mL Oral Daily PRN Gonzella Lex, MD      . mirtazapine (REMERON) tablet 45 mg  45 mg Oral QHS Gonzella Lex, MD   45 mg at 08/24/15 2129  . pantoprazole (PROTONIX) EC tablet 40 mg  40 mg Oral QAC breakfast Gonzella Lex, MD   40 mg at 08/25/15 0816  . rOPINIRole (REQUIP) tablet 3 mg  3 mg Oral QHS Gonzella Lex, MD   3 mg at 08/24/15 2128  . temazepam (RESTORIL) capsule 15 mg  15 mg Oral QHS PRN Gonzella Lex, MD      . venlafaxine XR (EFFEXOR-XR) 24 hr capsule 150 mg  150 mg Oral Q breakfast Gonzella Lex, MD   150 mg at 08/25/15 [id number]    Lab Results:  No results found for this or any previous visit (from the past 37 hour(s)).  Physical Findings: AIMS:  , ,  ,  ,    CIWA:  CIWA-Ar Total: 0 COWS:     Musculoskeletal: Strength & Muscle Tone: within normal limits Gait & Station: normal Patient leans: N/A  Psychiatric Specialty Exam: Review of Systems  Psychiatric/Behavioral: Positive for depression. Negative for suicidal ideas and substance abuse. The patient has insomnia. The patient is not nervous/anxious.   All other systems reviewed and are negative.   Blood pressure 156/87, pulse 86, temperature 98.2 F (36.8 C), temperature source Oral, resp. rate 18, height 5' (1.524 m), weight 176 lb (79.833 kg), SpO2 98 %.Body mass index is 34.37 kg/(m^2).  General Appearance: Casual  Eye Contact::  Fair  Speech:  Slow  Volume:  Decreased  Mood:  Depressed  Affect:  Congruent  Thought Process:  Coherent  Orientation:  Full (Time, Place, and Person)  Thought Content:  WDL  Suicidal Thoughts:  No  Homicidal Thoughts:  No  Memory:  Immediate;   Fair  Judgement:  Fair  Insight:  Fair  Psychomotor Activity:  Psychomotor Retardation   Concentration:  Fair  Recall:  [employer] of Knowledge:Fair  Language: Fair  Akathisia:  No  Handed:  Left  AIMS (if indicated):     Assets:  Communication Skills Desire for Improvement Social Support  ADL's:  Intact  Cognition: WNL  Sleep:  Number of Hours: 5.5   Treatment Plan Summary: Daily contact with patient to assess and evaluate symptoms and progress in treatment and Medication management    Daily contact with patient to assess and evaluate symptoms and progress in treatment, Medication management and Plan Patient has been continued on his usual medication for depression including Effexor and  Remeron.   Also his medicine for his blood pressure gastric reflux and COPD.  Encouraged his continue participation in groups. He ready for discharge within the next couple days  Observation Level/Precautions: 15 minute checks  Laboratory: Chemistry Profile  Psychotherapy: A individual and group psychotherapy   Medications: For now continue current medications including venlafaxine and Remeron   Consultations: None at this time   Discharge Concerns: Making sure his act team is aware of his disposition   Estimated LOS: 2-3 days   Other:          Rainey Pines 08/25/2015, 10:39 AM

## 2015-08-25 NOTE — Plan of Care (Signed)
Problem: Ineffective individual coping Goal: LTG: Patient will report a decrease in negative feelings Outcome: Progressing Patient smiles and laughs easily.  Verbalizes that he is feeling much better and rates depression as a 3

## 2015-08-25 NOTE — BHH Group Notes (Signed)
[facility] LCSW Group Therapy  08/25/2015 3:18 PM  Type of Therapy:  Group Therapy  Participation Level:  Minimal  Participation Quality:  Attentive  Affect:  Flat  Cognitive:  Alert  Insight:  Limited  Engagement in Therapy:  Limited  Modes of Intervention:  Discussion, Education, Socialization and Support  Summary of Progress/Problems: Todays topic: Grudges  Patients will be encouraged to discuss their thoughts, feelings, and behaviors as to why one holds on to grudges and reasons why people have grudges. Patients will process the impact of grudges on their daily lives and identify thoughts and feelings related to holding grudges. Patients will identify feelings and thoughts related to what life would look like without grudges. Pt attended group and stayed the entire time. Pt sat quietly and listened to other group members share.   [place] MSW, LCSWA  08/25/2015, 3:18 PM

## 2015-08-25 NOTE — Progress Notes (Signed)
D: Patient affect flat. Patient stated he had a good day. He rates his pain at an 0. He denies SI/HI/AVH at this time. He has been visible in the milieu and interacting with patients. No complaints. A: Medication was given with education. Encouragement was provided.  R: Patient was calm and cooperative with medication. Patient has been pleasant. Safety maintained with 15 min checks.

## 2015-08-25 NOTE — Plan of Care (Signed)
Problem: Ineffective individual coping Goal: STG: Patient will remain free from self harm Outcome: Progressing Patient has remained free from harm since admission.

## 2015-08-25 NOTE — Progress Notes (Signed)
Patient ID: LINDA SEPER, male   DOB: Mar 19, 1951, 64 y.o.   MRN: [id number]   D: Pt has been appropriate on the unit today, he attended all groups and engaged in treatment. Pt reported that he felt better today. Pt reported that he remained depressed, but was not anxious or hopeless. Pt took all medications with any problems. Pt reported being negative SI/HI, no AH/VH noted. A: 15 min checks continued for patient safety. R: Pt safety maintained.

## 2015-08-26 NOTE — B[facility] Suicide Risk Assessment (Signed)
[place] INPATIENT:  Family/Significant Other Suicide Prevention Education  Suicide Prevention Education:  Patient Refusal for Family/Significant Other Suicide Prevention Education: The patient Mathew Aguilar has refused to provide written consent for family/significant other to be provided Family/Significant Other Suicide Prevention Education during admission and/or prior to discharge.  Physician notified.  August Saucer, MSW, LCSW 08/26/2015, 10:40 AM

## 2015-08-26 NOTE — Progress Notes (Signed)
  [facility] Adult Case Management Discharge Plan :  Will you be returning to the same living situation after discharge:  Yes,    At discharge, do you have transportation home?: Yes,  ACT team to provide transportation back to vehicle which is at their office. Do you have the ability to pay for your medications: Yes,     Release of information consent forms completed and in the chart;  Patient's signature needed at discharge.  Patient to Follow up at: Follow-up Information    Schedule an appointment as soon as possible for a visit with [facility].   Why:  [facility] will pick up and transport to office to pick up vehicle and will schedule follow up accordingly., Hospital Follow up, Outpatient Medication Management, Therapy   Contact information:   [place], [place] [address] [phone] PHONE [phone] FAX      Next level of care provider has access to [facility] Link:no  Patient denies SI/HI: Yes,       Safety Planning and Suicide Prevention discussed: Yes,     Have you used any form of tobacco in the last 30 days? (Cigarettes, Smokeless Tobacco, Cigars, and/or Pipes): No  Has patient been referred to the Quitline?: N/A patient is not a smoker  Sheetal Lyall, Carloyn Jaeger, MSW, LCSW 08/26/2015, 10:43 AM

## 2015-08-26 NOTE — Progress Notes (Signed)
Discharge note: Patient discharged per MD order.  Patient received all personal belongings.  Reviewed discharge instructions along with follow up information.  Reviewed medications with patient and he indicated understand.  Patient left with [facility] representative.

## 2015-08-26 NOTE — Progress Notes (Signed)
Recreation Therapy Notes  INPATIENT RECREATION TR PLAN  Patient Details Name: Mathew Aguilar MRN: [id number] DOB: 1950/10/04 Today's Date: 08/26/2015  Rec Therapy Plan Is patient appropriate for Therapeutic Recreation?: Yes Treatment times per week: At least once a week TR Treatment/Interventions: 1:1 session, Group participation (Comment) (Appropriate participation in daily recreation therapy tx)  Discharge Criteria Pt will be discharged from therapy if:: Treatment goals are met, Discharged Treatment plan/goals/alternatives discussed and agreed upon by:: Patient/family  Discharge Summary Short term goals set: See Care Plan Short term goals met: Complete Progress toward goals comments: Groups attended One-to-one attended: Self-esteem, anger management Reason goals not met: N/A Therapeutic equipment acquired: None Reason patient discharged from therapy: Discharge from hospital Pt/family agrees with progress & goals achieved: Yes Date patient discharged from therapy: 08/26/15   Leonette Monarch, LRT/CTRS 08/26/2015, 5:46 PM

## 2015-08-26 NOTE — [facility] Suicide Risk Assessment (Signed)
[facility] Discharge Suicide Risk Assessment   Demographic Factors:  Male, Caucasian and Living alone  Total Time spent with patient: 30 minutes  Musculoskeletal: Strength & Muscle Tone: within normal limits Gait & Station: normal Patient leans: N/A  Psychiatric Specialty Exam: Physical Exam  Nursing note and vitals reviewed.   Review of Systems  All other systems reviewed and are negative.   Blood pressure 141/83, pulse 66, temperature 98.2 F (36.8 C), temperature source Oral, resp. rate 20, height 5' (1.524 m), weight 79.833 kg (176 lb), SpO2 98 %.Body mass index is 34.37 kg/(m^2).  General Appearance: Casual  Eye Contact::  Good  Speech:  Clear and [id number]  Volume:  Normal  Mood:  Euthymic  Affect:  Appropriate  Thought Process:  Goal Directed  Orientation:  Full (Time, Place, and Person)  Thought Content:  WDL  Suicidal Thoughts:  No  Homicidal Thoughts:  No  Memory:  Immediate;   Fair Recent;   Fair Remote;   Fair  Judgement:  Fair  Insight:  Fair  Psychomotor Activity:  Normal  Concentration:  Fair  Recall:  [employer] [place]  Language: Fair  Akathisia:  No  Handed:  Left  AIMS (if indicated):     Assets:  Communication Skills Desire for Improvement Financial Resources/Insurance Housing Physical Health Resilience Social Support Transportation  Sleep:  Number of Hours: 7  Cognition: WNL  ADL's:  Intact   Have you used any form of tobacco in the last 30 days? (Cigarettes, Smokeless Tobacco, Cigars, and/or Pipes): No  Has this patient used any form of tobacco in the last 30 days? (Cigarettes, Smokeless Tobacco, Cigars, and/or Pipes) No  Mental Status Per Nursing Assessment::   On Admission:     Current Mental Status by Physician: NA  Loss Factors: Loss of significant relationship  Historical Factors: Prior suicide attempts and Impulsivity  Risk Reduction Factors:   Sense of responsibility to family, Positive social support and  Positive therapeutic relationship  Continued Clinical Symptoms:  Depression:   Impulsivity  Cognitive Features That Contribute To Risk:  None    Suicide Risk:  Minimal: No identifiable suicidal ideation.  Patients presenting with no risk factors but with morbid ruminations; may be classified as minimal risk based on the severity of the depressive symptoms  Principal Problem: Severe recurrent major depression without psychotic features [facility]) Discharge Diagnoses:  Patient Active Problem List   Diagnosis Date Noted  . Severe recurrent major depression without psychotic features ([place]) [F33.2] 08/22/2015  . Major depression ([place]) [F32.9] 06/12/2015  . Severe recurrent major depressive disorder with psychotic features ([place]) [F33.3] 02/21/2015  . COPD exacerbation ([place]) [J44.1] 02/19/2015  . GERD (gastroesophageal reflux disease) [K21.9] 02/19/2015  . Alcohol dependence in early full remission ([place]) [F10.21] 02/18/2015  . Essential hypertension [I10] 02/18/2015  . Restless legs syndrome [G25.81] 02/18/2015    Follow-up Information    Schedule an appointment as soon as possible for a visit with [facility].   Why:  [facility] will pick up and transport to office to pick up vehicle and will schedule follow up accordingly., Hospital Follow up, Outpatient Medication Management, Therapy   Contact information:   [place], [place] [address] [phone] PHONE [place] recommendations:  Activity:  as tolerated. Diet:  low sodium heart healthy. Other:  keep follow-up appointments.  Is patient on multiple antipsychotic therapies at discharge:  No   Has Patient had three or  more failed trials of antipsychotic monotherapy by history:  No  Recommended Plan for Multiple Antipsychotic Therapies: NA    Adria Costley 08/26/2015, 11:49 AM

## 2015-08-26 NOTE — Plan of Care (Signed)
Problem: [facility] Participation in Recreation Therapeutic Interventions Goal: STG-Patient will demonstrate improved self esteem by identif STG: Self-Esteem - Within 3 treatment sessions, patient will verbalize at least 5 positive affirmation statements in one treatment session to increase self-esteem post d/c.  Outcome: Completed/Met Date Met:  08/26/15 Treatment Session 1; Completed 1 out of 1: At approximately 11:25 am, LRT met with patient in consultation room. Patient verbalized 5 positive affirmation statements. Patient reported it felt "okay". LRT encouraged patient to continue saying positive affirmation statements. Intervention Used: I Am statements  Leonette Monarch, LRT/CTRS 12.12.16 5:42 pm Goal: STG-Patient will identify at least five coping skills for ** STG: Coping Skills - Within 4 treatment sessions, patient will verbalize at least 5 coping skills for anger in one treatment session to increase anger management skills post d/c.  Outcome: Completed/Met Date Met:  08/26/15 Treatment Session 1; Completed 1 out of 1: At approximately 11:25 am, LRT met with patient in consultation room. Patient verbalized 5 coping skills for anger. Patient verbalized how his body responds to anger, and how he is going to remind himself how to use his healthy coping skills. LRT provided suggestions as well. Intervention Used: Coping Skills worksheet  Leonette Monarch, LRT/CTRS 12.12.16 5:45 pm

## 2015-08-26 NOTE — Discharge Summary (Signed)
Physician Discharge Summary Note  Patient:  Mathew Aguilar is an 64 y.o., male MRN:  [id number] DOB:  16-Nov-1950 Patient phone:  [phone] (home)  Patient address:   [address]  [place] [address],  Total Time spent with patient: 30 minutes  Date of Admission:  08/22/2015 Date of Discharge: 08/26/2015  Reason for Admission:  Suicidal ideation.  History of Present Illness:: Intake for this 64 year old man with long history of depression and of alcohol abuse in remission. He came into the hospital reporting that his mood had been much worse for about the last week. Sleep was poor. Energy poor. Having thoughts about wishing he were dead and feeling worthless and hopeless. Patient knew of no stressor that could've brought this on. He denied that he had been drinking and he said he had been compliant with all of his medication. Denied any new psychosocial stress. Associated Signs/Symptoms: Depression Symptoms: depressed mood, anhedonia, hypersomnia, psychomotor retardation, feelings of worthlessness/guilt, difficulty concentrating, hopelessness, suicidal thoughts without plan, (Hypo) Manic Symptoms: None Anxiety Symptoms: Excessive Worry, Social Anxiety, Psychotic Symptoms: None PTSD Symptoms: Negative  Past Psychiatric History: Patient has a long-standing history of depression with a past history of more than one suicide attempt. Has had some manic like symptoms in the past but often in the context of alcohol abuse. Currently followed by an active team who work with him closely. Lives independently. Takes medication that has been found to be helpful for his mood. He is currently maintaining longer term sobriety  Principal Problem: Severe recurrent major depression without psychotic features [facility]) Discharge Diagnoses: Patient Active Problem List   Diagnosis Date Noted  . Severe recurrent major depression without psychotic features ([place]) [F33.2] 08/22/2015  . Major  depression ([place]) [F32.9] 06/12/2015  . Severe recurrent major depressive disorder with psychotic features ([place]) [F33.3] 02/21/2015  . COPD exacerbation ([place]) [J44.1] 02/19/2015  . GERD (gastroesophageal reflux disease) [K21.9] 02/19/2015  . Alcohol dependence in early full remission ([place]) [F10.21] 02/18/2015  . Essential hypertension [I10] 02/18/2015  . Restless legs syndrome [G25.81] 02/18/2015    Past Psychiatric History: depression.  Past Medical History:  Past Medical History  Diagnosis Date  . Hypertension   . Depression   . Restless leg     Past Surgical History  Procedure Laterality Date  . Amputation arm     Family History:  Family History  Problem Relation Age of Onset  . Family history unknown: Yes   Family Psychiatric  History: none reported. Social History:  History  Alcohol Use No    Comment: history of ETOH abuse - sober several months     History  Drug Use No    Social History   Social History  . Marital Status: Single    Spouse Name: N/A  . Number of Children: N/A  . Years of Education: N/A   Social History Main Topics  . Smoking status: Never Smoker   . Smokeless tobacco: Never Used  . Alcohol Use: No     Comment: history of ETOH abuse - sober several months  . Drug Use: No  . Sexual Activity: Not Currently    Birth Control/ Protection: None   Other Topics Concern  . None   Social History Narrative    Hospital Course:    Mathew Aguilar is a 64 year old male with a history of depression admitted for suicidal ideation in the context of approaching anniversary of his mother's death.  1. Suicidal ideation. This has resolved. The patient  is able to contract for safety.  2. . Mood. We continued Effexor, Remeron and Abilify for depression and psychosis.  3. Hypertension. We continued atenolol and hydrochlorothiazide.   4. COPD. We continued inhalers.  5. GERD. We continued Protonix.  6. Restless leg syndrome. We continued Requip.  7.  Insomnia. We offered Restoril.  8. Disposition. The patient was discharged to home. He will follow up with ACT team   Physical Findings: AIMS:  , ,  ,  ,    CIWA:  CIWA-Ar Total: 0 COWS:     Musculoskeletal: Strength & Muscle Tone: within normal limits Gait & Station: normal Patient leans: N/A  Psychiatric Specialty Exam: Review of Systems  All other systems reviewed and are negative.   Blood pressure 141/83, pulse 66, temperature 98.2 F (36.8 C), temperature source Oral, resp. rate 20, height 5' (1.524 m), weight 79.833 kg (176 lb), SpO2 98 %.Body mass index is 34.37 kg/(m^2).  See SRA.                                                  Sleep:  Number of Hours: 7   Have you used any form of tobacco in the last 30 days? (Cigarettes, Smokeless Tobacco, Cigars, and/or Pipes): No  Has this patient used any form of tobacco in the last 30 days? (Cigarettes, Smokeless Tobacco, Cigars, and/or Pipes) Yes, No  Metabolic Disorder Labs:  Lab Results  Component Value Date   HGBA1C 6.7* 08/22/2015   No results found for: PROLACTIN Lab Results  Component Value Date   CHOL 180 08/22/2015   TRIG 213* 08/22/2015   HDL 28* 08/22/2015   CHOLHDL 6.4 08/22/2015   VLDL 43* 08/22/2015   LDLCALC 109* 08/22/2015   LDLCALC 98 06/12/2015    See Psychiatric Specialty Exam and Suicide Risk Assessment completed by Attending Physician prior to discharge.  Discharge destination:  Home  Is patient on multiple antipsychotic therapies at discharge:  No   Has Patient had three or more failed trials of antipsychotic monotherapy by history:  No  Recommended Plan for Multiple Antipsychotic Therapies: NA     Medication List    TAKE these medications      Indication   albuterol (2.5 MG/3ML) 0.083% nebulizer solution  Commonly known as:  PROVENTIL  Inhale 3 mLs (2.5 mg total) into the lungs every 4 (four) hours as needed for wheezing or shortness of breath.       ARIPiprazole 20 MG tablet  Commonly known as:  ABILIFY  Take 1 tablet (20 mg total) by mouth daily.   Indication:  Major Depressive Disorder     atenolol 25 MG tablet  Commonly known as:  TENORMIN  Take 1 tablet (25 mg total) by mouth daily.   Indication:  High Blood Pressure     fluticasone 50 MCG/ACT nasal spray  Commonly known as:  FLONASE  Place 2 sprays into both nostrils daily as needed for rhinitis.      hydrochlorothiazide 12.5 MG tablet  Commonly known as:  HYDRODIURIL  Take 1 tablet (12.5 mg total) by mouth daily.   Indication:  High Blood Pressure     hydrOXYzine 25 MG tablet  Commonly known as:  ATARAX/VISTARIL  Take 1 tablet (25 mg total) by mouth 3 (three) times daily.   Indication:  Anxiety Neurosis  mirtazapine 45 MG tablet  Commonly known as:  REMERON  Take 1 tablet (45 mg total) by mouth at bedtime.   Indication:  Major Depressive Disorder     pantoprazole 40 MG tablet  Commonly known as:  PROTONIX  Take 1 tablet (40 mg total) by mouth 2 (two) times daily.   Indication:  Gastroesophageal Reflux Disease     rOPINIRole 3 MG tablet  Commonly known as:  REQUIP  Take 1 tablet (3 mg total) by mouth at bedtime.   Indication:  Restless Leg Syndrome     temazepam 15 MG capsule  Commonly known as:  RESTORIL  Take 1 capsule (15 mg total) by mouth at bedtime as needed for sleep.   Indication:  Trouble Sleeping     venlafaxine XR 150 MG 24 hr capsule  Commonly known as:  EFFEXOR-XR  Take 1 capsule (150 mg total) by mouth daily with breakfast.   Indication:  Major Depressive Disorder           Follow-up Information    Schedule an appointment as soon as possible for a visit with [facility].   Why:  [facility] will pick up and transport to office to pick up vehicle and will schedule follow up accordingly., Hospital Follow up, Outpatient Medication Management, Therapy   Contact information:   [place], [place]  [address] [phone] PHONE [phone] FAX      Follow-up recommendations:  Activity:  as tolerated. Diet:  low sodium heart healthy. Other:  keep follow up appointments.  Comments:    Signed: Makira Holleman 08/26/2015, 12:02 PM

## 2015-08-26 NOTE — Progress Notes (Signed)
D: Pt seen in milieu this evening interacting with peers. Denies SI/HI/AVH at this time. Denies pain. Pt brightens on approach. No complaints at this time. A: Medications given as prescribed. q15 minute safety checks maintained. R: Pt remains free from harm.

## 2015-08-26 NOTE — Plan of Care (Signed)
Problem: Alteration in mood Goal: LTG-Pt's behavior demonstrates decreased signs of depression (Patient's behavior demonstrates decreased signs of depression to the point the patient is safe to return home and continue treatment in an outpatient setting)  Outcome: Progressing Pt's behavior shows decreased signs of depression. Pt laughs and jokes around with writer  Problem: Ineffective individual coping Goal: STG: Patient will remain free from self harm Outcome: Progressing Pt remains free from harm   Problem: Diagnosis: Increased Risk For Suicide Attempt Goal: STG-Patient Will Comply With Medication Regime Outcome: Progressing Pt taking medications as prescribed

## 2015-09-30 ENCOUNTER — Other Ambulatory Visit: Payer: Self-pay | Admitting: Internal Medicine

## 2015-09-30 DIAGNOSIS — R748 Abnormal levels of other serum enzymes: Secondary | ICD-10-CM

## 2015-10-03 ENCOUNTER — Ambulatory Visit
Admission: RE | Admit: 2015-10-03 | Discharge: 2015-10-03 | Disposition: A | Discharge status: Home / Self Care | Payer: Commercial Managed Care - HMO | Source: Ambulatory Visit | Attending: Internal Medicine | Admitting: Internal Medicine

## 2015-10-03 DIAGNOSIS — R748 Abnormal levels of other serum enzymes: Secondary | ICD-10-CM | POA: Insufficient documentation

## 2015-10-07 ENCOUNTER — Ambulatory Visit: Payer: Commercial Managed Care - HMO

## 2015-10-24 DIAGNOSIS — K760 Fatty (change of) liver, not elsewhere classified: Secondary | ICD-10-CM | POA: Insufficient documentation

## 2016-01-23 DIAGNOSIS — Z6832 Body mass index (BMI) 32.0-32.9, adult: Secondary | ICD-10-CM | POA: Insufficient documentation

## 2016-02-01 ENCOUNTER — Emergency Department
Admission: EM | Admit: 2016-02-01 | Discharge: 2016-02-01 | Disposition: A | Discharge status: Home / Self Care | Payer: Commercial Managed Care - HMO | Attending: Emergency Medicine | Admitting: Emergency Medicine

## 2016-02-01 DIAGNOSIS — F99 Mental disorder, not otherwise specified: Secondary | ICD-10-CM | POA: Diagnosis not present

## 2016-02-01 DIAGNOSIS — Z5321 Procedure and treatment not carried out due to patient leaving prior to being seen by health care provider: Secondary | ICD-10-CM | POA: Diagnosis not present
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
## 2016-02-02 ENCOUNTER — Encounter: Payer: Self-pay | Admitting: Emergency Medicine

## 2016-02-02 ENCOUNTER — Emergency Department
Admission: EM | Admit: 2016-02-02 | Discharge: 2016-02-03 | Disposition: A | Discharge status: Home / Self Care | Payer: Commercial Managed Care - HMO | Attending: Emergency Medicine | Admitting: Emergency Medicine

## 2016-02-02 DIAGNOSIS — R45851 Suicidal ideations: Secondary | ICD-10-CM | POA: Diagnosis present

## 2016-02-02 DIAGNOSIS — Z7984 Long term (current) use of oral hypoglycemic drugs: Secondary | ICD-10-CM | POA: Insufficient documentation

## 2016-02-02 DIAGNOSIS — I10 Essential (primary) hypertension: Secondary | ICD-10-CM | POA: Insufficient documentation

## 2016-02-02 DIAGNOSIS — F329 Major depressive disorder, single episode, unspecified: Secondary | ICD-10-CM | POA: Insufficient documentation

## 2016-02-02 DIAGNOSIS — R7989 Other specified abnormal findings of blood chemistry: Secondary | ICD-10-CM

## 2016-02-02 DIAGNOSIS — Z79899 Other long term (current) drug therapy: Secondary | ICD-10-CM | POA: Insufficient documentation

## 2016-02-02 DIAGNOSIS — R945 Abnormal results of liver function studies: Secondary | ICD-10-CM | POA: Diagnosis not present

## 2016-02-02 DIAGNOSIS — F333 Major depressive disorder, recurrent, severe with psychotic symptoms: Secondary | ICD-10-CM | POA: Diagnosis present

## 2016-02-02 LAB — URINE DRUG SCREEN, QUALITATIVE (ARMC ONLY)
AMPHETAMINES, UR SCREEN: NOT DETECTED
BARBITURATES, UR SCREEN: NOT DETECTED
Benzodiazepine, Ur Scrn: NOT DETECTED
COCAINE METABOLITE, UR ~~LOC~~: NOT DETECTED
Cannabinoid 50 Ng, Ur ~~LOC~~: NOT DETECTED
MDMA (ECSTASY) UR SCREEN: NOT DETECTED
METHADONE SCREEN, URINE: NOT DETECTED
Opiate, Ur Screen: NOT DETECTED
Phencyclidine (PCP) Ur S: NOT DETECTED
TRICYCLIC, UR SCREEN: NOT DETECTED

## 2016-02-02 LAB — COMPREHENSIVE METABOLIC PANEL
ALT: 68 U/L — ABNORMAL HIGH (ref 17–63)
ANION GAP: 12 (ref 5–15)
AST: 52 U/L — ABNORMAL HIGH (ref 15–41)
Albumin: 4.5 g/dL (ref 3.5–5.0)
Alkaline Phosphatase: 45 U/L (ref 38–126)
BUN: 9 mg/dL (ref 6–20)
CHLORIDE: 105 mmol/L (ref 101–111)
CO2: 19 mmol/L — AB (ref 22–32)
Calcium: 9.2 mg/dL (ref 8.9–10.3)
Creatinine, Ser: 0.73 mg/dL (ref 0.61–1.24)
GFR calc non Af Amer: >60 mL/min (ref >60)
Glucose, Bld: 94 mg/dL (ref 65–99)
POTASSIUM: 3.8 mmol/L (ref 3.5–5.1)
SODIUM: 136 mmol/L (ref 135–145)
Total Bilirubin: 1.8 mg/dL — ABNORMAL HIGH (ref 0.3–1.2)
Total Protein: 8.1 g/dL (ref 6.5–8.1)

## 2016-02-02 LAB — CBC
HCT: 52.8 % — ABNORMAL HIGH (ref 40.0–52.0)
HEMOGLOBIN: 18.1 g/dL — AB (ref 13.0–18.0)
MCH: 29.8 pg (ref 26.0–34.0)
MCHC: 34.3 g/dL (ref 32.0–36.0)
MCV: 86.8 fL (ref 80.0–100.0)
Platelets: 212 10*3/uL (ref 150–440)
RBC: 6.08 MIL/uL — AB (ref 4.40–5.90)
RDW: 15.8 % — ABNORMAL HIGH (ref 11.5–14.5)
WBC: 5.9 10*3/uL (ref 3.8–10.6)

## 2016-02-02 LAB — GLUCOSE, CAPILLARY: Glucose-Capillary: 104 mg/dL — ABNORMAL HIGH (ref 65–99)

## 2016-02-02 LAB — ETHANOL: Alcohol, Ethyl (B): <5 mg/dL (ref <5)

## 2016-02-02 LAB — SALICYLATE LEVEL

## 2016-02-02 LAB — ACETAMINOPHEN LEVEL

## 2016-02-02 MED ORDER — TRAZODONE HCL 100 MG PO TABS
100.0000 mg | ORAL_TABLET | Freq: Every evening | ORAL | Status: DC | PRN
  Administered 2016-02-02: 100 mg via ORAL
  Filled 2016-02-02: qty 1

## 2016-02-02 MED ORDER — ATENOLOL 25 MG PO TABS
25.0000 mg | ORAL_TABLET | Freq: Every day | ORAL | Status: DC
  Administered 2016-02-03: 25 mg via ORAL
  Filled 2016-02-02 (×2): qty 1

## 2016-02-02 MED ORDER — ROPINIROLE HCL 1 MG PO TABS
ORAL_TABLET | ORAL | Status: AC
  Administered 2016-02-02: 3 mg via ORAL
  Filled 2016-02-02: qty 3

## 2016-02-02 MED ORDER — ATENOLOL 25 MG PO TABS: 25.0000 mg | ORAL_TABLET | Freq: Every day | ORAL | Status: DC

## 2016-02-02 MED ORDER — ROPINIROLE HCL 1 MG PO TABS
3.0000 mg | ORAL_TABLET | Freq: Every day | ORAL | Status: DC
  Administered 2016-02-02: 3 mg via ORAL

## 2016-02-02 NOTE — ED Notes (Signed)
Sandwich and soft drink given.  

## 2016-02-02 NOTE — ED Notes (Signed)
Spoke with dr. Jimmye Norman regarding the pt's medication list. Still unable to reconcile d/t pharmacy being closed and pt being a very poor historian. Per dr. Pershing Proud the meds ordered as is until pt is evaluated by psych and can be verified.

## 2016-02-02 NOTE — ED Notes (Signed)
States "I'm feeling very depressed".  States began feeling this way yesterday.  States having thoughts of harming self, states "I just don't care if I live or die".  Denies plan.  Denies. HI.
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
## 2016-02-02 NOTE — BH Assessment (Signed)
Assessment Note  Mathew Aguilar is an 65 y.o. male who presents to the ER due to having SI. Patient states he has no plan. However, patient have a history of multiple suicide attempts an is known to be impulsive.  During the time of the interview, the patient shared he don't know why having SI and that it's been for several days. Previous ER visits, he be intoxicated and would have significant role with the SI. With this visit, his BAC is 0 and UDS is negative for any substances.  Patient is denying HI and AV/H.  Patient currently receiving Outpatient with [facility].  Diagnosis: Depression  Past Medical History:  Past Medical History  Diagnosis Date  . Hypertension   . Depression   . Restless leg     Past Surgical History  Procedure Laterality Date  . Amputation arm      Family History:  Family History  Problem Relation Age of Onset  . Family history unknown: Yes    Social History:  reports that he has never smoked. He has never used smokeless tobacco. He reports that he does not drink alcohol or use illicit drugs.  Additional Social History:  Alcohol / Drug Use Pain Medications: See PTA Prescriptions: See PTA Over the Counter: See PTA History of alcohol / drug use?: Yes Longest period of sobriety (when/how long): Unknown Negative Consequences of Use: Financial, Personal relationships Withdrawal Symptoms:  (Reports of none) Substance #1 Name of Substance 1: Alcohol 1 - Age of First Use: Unknown 1 - Amount (size/oz): 12 pack 1 - Frequency: Daily  1 - Duration: "For years" 1 - Last Use / Amount: 02/01/2016  CIWA: CIWA-Ar BP: (!) 132/57 mmHg Pulse Rate: 81 COWS:    Allergies: No Known Allergies  Home Medications:  (Not in a hospital admission)  OB/GYN Status:  No LMP for male patient.  General Assessment Data Location of Assessment: [facility] ED TTS Assessment: In system Is this a Tele or Face-to-Face Assessment?: Face-to-Face Is this an Initial Assessment  or a Re-assessment for this encounter?: Initial Assessment Marital status: Single Maiden name: n/a Is patient pregnant?: No Pregnancy Status: No Living Arrangements: Alone Can pt return to current living arrangement?: Yes Admission Status: Involuntary Is patient capable of signing voluntary admission?: Yes Referral Source: Self/Family/Friend Insurance type: Medicare  Medical Screening Exam ([place]) Medical Exam completed: Yes  Crisis Care Plan Living Arrangements: Alone Legal Guardian: Other: (None) Name of Psychiatrist: [facility] Name of Therapist: [facility]  Education Status Is patient currently in school?: No Current Grade: n/a Highest grade of school patient has completed: Unknown Name of school: n/a Contact person: n/a  Risk to self with the past 6 months Suicidal Ideation: Yes-Currently Present Has patient been a risk to self within the past 6 months prior to admission? : Yes Suicidal Intent: Yes-Currently Present Has patient had any suicidal intent within the past 6 months prior to admission? : Yes Is patient at risk for suicide?: Yes Suicidal Plan?: No Has patient had any suicidal plan within the past 6 months prior to admission? : No Access to Means: Yes What has been your use of drugs/alcohol within the last 12 months?: Alcohol Previous Attempts/Gestures: Yes How many times?: 5 Other Self Harm Risks: Active Addiction Triggers for Past Attempts: Unpredictable, Family contact, Other personal contacts Intentional Self Injurious Behavior: None Family Suicide History: No Recent stressful life event(s): Other (Comment), Loss (Comment), Conflict (Comment), Financial Problems Persecutory voices/beliefs?: No Depression: Yes Depression  Symptoms: Feeling angry/irritable, Feeling worthless/self pity, Loss of interest in usual pleasures, Guilt, Fatigue, Isolating, Tearfulness Substance abuse history and/or treatment for substance abuse?: No Suicide prevention  information given to non-admitted patients: Not applicable  Risk to Others within the past 6 months Homicidal Ideation: No Does patient have any lifetime risk of violence toward others beyond the six months prior to admission? : No Thoughts of Harm to Others: No Current Homicidal Intent: No Current Homicidal Plan: No Access to Homicidal Means: No Identified Victim: Reports of none History of harm to others?: No Assessment of Violence: None Noted Violent Behavior Description: Reports of none Does patient have access to weapons?: No Criminal Charges Pending?: No Does patient have a court date: No Is patient on probation?: No  Psychosis Hallucinations: None noted Delusions: None noted  Mental Status Report Appearance/Hygiene: In hospital gown, In scrubs, Unremarkable Eye Contact: Fair Motor Activity: Unremarkable, Freedom of movement Speech: Logical/coherent, Unremarkable Level of Consciousness: Alert Mood: Depressed, Anxious, Sad, Pleasant Affect: Anxious, Appropriate to circumstance, Sad Anxiety Level: Minimal Thought Processes: Coherent, Relevant Judgement: Unimpaired Orientation: Person, Place, Time, Situation, Appropriate for developmental age Obsessive Compulsive Thoughts/Behaviors: Minimal  Cognitive Functioning Concentration: Normal Memory: Recent Intact, Remote Intact IQ: Average Insight: Fair Impulse Control: Poor Appetite: Good Weight Loss: 0 Weight Gain: 0 Sleep: No Change Total Hours of Sleep: 8 Vegetative Symptoms: None  ADLScreening [facility] Assessment Services) Patient's cognitive ability adequate to safely complete daily activities?: Yes Patient able to express need for assistance with ADLs?: Yes Independently performs ADLs?: Yes (appropriate for developmental age)  Prior Inpatient Therapy Prior Inpatient Therapy: Yes Prior Therapy Dates: Multiple Dates Prior Therapy Facilty/Provider(s): [facility] Reason for Treatment: Depression and Alcohol  use  Prior Outpatient Therapy Prior Outpatient Therapy: Yes Prior Therapy Dates: Current Prior Therapy Facilty/Provider(s): [facility] Reason for Treatment: Depression Does patient have an ACCT team?: Yes Does patient have Intensive In-House Services?  : No Does patient have [facility] services? : No Does patient have P4CC services?: No  ADL Screening (condition at time of admission) Patient's cognitive ability adequate to safely complete daily activities?: Yes Is the patient deaf or have difficulty hearing?: No Does the patient have difficulty seeing, even when wearing glasses/contacts?: No Does the patient have difficulty concentrating, remembering, or making decisions?: No Patient able to express need for assistance with ADLs?: Yes Does the patient have difficulty dressing or bathing?: No Independently performs ADLs?: Yes (appropriate for developmental age) Does the patient have difficulty walking or climbing stairs?: No Weakness of Legs: None Weakness of Arms/Hands: None  Home Assistive Devices/Equipment Home Assistive Devices/Equipment: None  Therapy Consults (therapy consults require a physician order) PT Evaluation Needed: No OT Evalulation Needed: No SLP Evaluation Needed: No Abuse/Neglect Assessment (Assessment to be complete while patient is alone) Physical Abuse: Denies Verbal Abuse: Denies Sexual Abuse: Denies Exploitation of patient/patient's resources: Denies Self-Neglect: Denies Values / Beliefs Cultural Requests During Hospitalization: None Spiritual Requests During Hospitalization: None Consults Spiritual Care Consult Needed: No Social Work Consult Needed: No Regulatory affairs officer (For Healthcare) Does patient have an advance directive?: No Would patient like information on creating an advanced directive?: Yes Higher education careers adviser given    Additional Information 1:1 In Past 12 Months?: No CIRT Risk: No Elopement Risk: No Does patient have medical  clearance?: Yes  Child/Adolescent Assessment Running Away Risk: Denies (Patient is an adult)  Disposition:  Disposition Initial Assessment Completed for this Encounter: Yes Disposition of Patient: Other dispositions (ER MD ordered Psych consult) Other disposition(s): Other (  Comment) (ER MD ordered Psych consult)  On Site Evaluation by:   Reviewed with Physician:    Gunnar Fusi MS, LCAS, [place], [place], CCSI Therapeutic Triage Specialist 02/02/2016 9:08 PM

## 2016-02-03 DIAGNOSIS — R45851 Suicidal ideations: Secondary | ICD-10-CM

## 2016-02-03 DIAGNOSIS — F333 Major depressive disorder, recurrent, severe with psychotic symptoms: Secondary | ICD-10-CM

## 2016-02-03 MED ORDER — HYDROXYZINE HCL 25 MG PO TABS
50.0000 mg | ORAL_TABLET | Freq: Once | ORAL | Status: AC
  Administered 2016-02-03: 50 mg via ORAL

## 2016-02-03 MED ORDER — HYDROXYZINE HCL 25 MG PO TABS
ORAL_TABLET | ORAL | Status: AC
  Administered 2016-02-03: 50 mg via ORAL
  Filled 2016-02-03: qty 2

## 2016-02-03 NOTE — ED Notes (Signed)
Pt pleasant and calm. Pt denies SI and stated he feels ready to go home. RN explained he will see psychiatrist later today who will determine his disposition. Pt accepting. No concerns voiced. No distress noted. Maintained on 15 minute checks and observation by security camera for safety.
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
## 2016-02-03 NOTE — ED Notes (Signed)
Pt taking a shower 

## 2016-02-03 NOTE — Consult Note (Signed)
  Psychiatry: Brief note. Full note to follow. Patient well known to me. Not currently suicidal. IVC discontinued. Patient will be able to be discharge and follow-up with local provider.

## 2016-02-03 NOTE — Discharge Instructions (Signed)
Please make an appointment with your primary care doctor for follow up for your abnormal liver function tests.

## 2016-02-03 NOTE — ED Notes (Signed)
Patient sitting in dayroom. No noted distress or abnormal behaviors noted. Will continue 15 minute checks and observation by security camera for safety.

## 2016-02-03 NOTE — ED Notes (Signed)
Pt given lunch tray. Pt will be discharged today. Maintained on 15 minute checks and observation by security camera for safety.

## 2016-02-03 NOTE — ED Notes (Signed)
Pt dressing for discharge. All belongings will be returned to pt upon leaving unit. Pt denies SI/HI. Maintained on 15 minute checks and observation by security camera for safety.

## 2016-02-03 NOTE — ED Provider Notes (Signed)
-----------------------------------------   8:22 AM on 02/03/2016 -----------------------------------------   Blood pressure 147/85, pulse 70, temperature 97.8 F (36.6 C), temperature source Oral, resp. rate 18, height 5' (1.524 m), weight 160 lb (72.576 kg), SpO2 100 %.  The patient had no acute events since last update.  Calm and cooperative at this time.    Patient to be seen by psych today     Loney Hering, MD 02/03/16 [phone]

## 2016-02-26 ENCOUNTER — Encounter: Payer: Self-pay | Admitting: *Deleted

## 2016-02-26 ENCOUNTER — Emergency Department
Admission: EM | Admit: 2016-02-26 | Discharge: 2016-02-27 | Discharge status: Against Medical Advice | Payer: Commercial Managed Care - HMO | Attending: Student | Admitting: Student

## 2016-02-26 DIAGNOSIS — F1010 Alcohol abuse, uncomplicated: Secondary | ICD-10-CM

## 2016-02-26 DIAGNOSIS — R45851 Suicidal ideations: Secondary | ICD-10-CM

## 2016-02-26 DIAGNOSIS — J441 Chronic obstructive pulmonary disease with (acute) exacerbation: Secondary | ICD-10-CM | POA: Insufficient documentation

## 2016-02-26 DIAGNOSIS — R45850 Homicidal ideations: Secondary | ICD-10-CM | POA: Insufficient documentation

## 2016-02-26 DIAGNOSIS — I10 Essential (primary) hypertension: Secondary | ICD-10-CM | POA: Diagnosis not present

## 2016-02-26 DIAGNOSIS — F333 Major depressive disorder, recurrent, severe with psychotic symptoms: Secondary | ICD-10-CM | POA: Diagnosis present

## 2016-02-26 DIAGNOSIS — Z79899 Other long term (current) drug therapy: Secondary | ICD-10-CM | POA: Insufficient documentation

## 2016-02-26 DIAGNOSIS — G2581 Restless legs syndrome: Secondary | ICD-10-CM | POA: Diagnosis present

## 2016-02-26 LAB — COMPREHENSIVE METABOLIC PANEL
ALK PHOS: 46 U/L (ref 38–126)
ALT: 75 U/L — ABNORMAL HIGH (ref 17–63)
ANION GAP: 14 (ref 5–15)
AST: 51 U/L — ABNORMAL HIGH (ref 15–41)
Albumin: 4.4 g/dL (ref 3.5–5.0)
BILIRUBIN TOTAL: 1.4 mg/dL — AB (ref 0.3–1.2)
BUN: 5 mg/dL — ABNORMAL LOW (ref 6–20)
CALCIUM: 9.3 mg/dL (ref 8.9–10.3)
CO2: 20 mmol/L — ABNORMAL LOW (ref 22–32)
Chloride: 102 mmol/L (ref 101–111)
Creatinine, Ser: 0.68 mg/dL (ref 0.61–1.24)
GFR calc Af Amer: >60 mL/min (ref >60)
Glucose, Bld: 81 mg/dL (ref 65–99)
POTASSIUM: 3.8 mmol/L (ref 3.5–5.1)
Sodium: 136 mmol/L (ref 135–145)
TOTAL PROTEIN: 8 g/dL (ref 6.5–8.1)

## 2016-02-26 LAB — URINE DRUG SCREEN, QUALITATIVE (ARMC ONLY)
Amphetamines, Ur Screen: NOT DETECTED
BARBITURATES, UR SCREEN: NOT DETECTED
Benzodiazepine, Ur Scrn: NOT DETECTED
CANNABINOID 50 NG, UR ~~LOC~~: NOT DETECTED
COCAINE METABOLITE, UR ~~LOC~~: NOT DETECTED
MDMA (ECSTASY) UR SCREEN: NOT DETECTED
Methadone Scn, Ur: NOT DETECTED
OPIATE, UR SCREEN: NOT DETECTED
PHENCYCLIDINE (PCP) UR S: NOT DETECTED
Tricyclic, Ur Screen: NOT DETECTED

## 2016-02-26 LAB — ACETAMINOPHEN LEVEL: Acetaminophen (Tylenol), Serum: <10 ug/mL — ABNORMAL LOW (ref 10–30)

## 2016-02-26 LAB — CBC
HEMATOCRIT: 51 % (ref 40.0–52.0)
HEMOGLOBIN: 17.4 g/dL (ref 13.0–18.0)
MCH: 30 pg (ref 26.0–34.0)
MCHC: 34 g/dL (ref 32.0–36.0)
MCV: 88.2 fL (ref 80.0–100.0)
Platelets: 227 10*3/uL (ref 150–440)
RBC: 5.79 MIL/uL (ref 4.40–5.90)
RDW: 16.3 % — ABNORMAL HIGH (ref 11.5–14.5)
WBC: 5.7 10*3/uL (ref 3.8–10.6)

## 2016-02-26 LAB — ETHANOL: ALCOHOL ETHYL (B): 53 mg/dL — AB (ref <5)

## 2016-02-26 LAB — SALICYLATE LEVEL

## 2016-02-26 MED ORDER — HYDROXYZINE HCL 25 MG PO TABS
ORAL_TABLET | ORAL | Status: AC
  Administered 2016-02-26: 25 mg via ORAL
  Filled 2016-02-26: qty 1

## 2016-02-26 MED ORDER — TRAZODONE HCL 100 MG PO TABS
ORAL_TABLET | ORAL | Status: AC
  Administered 2016-02-26: 100 mg via ORAL
  Filled 2016-02-26: qty 1

## 2016-02-26 MED ORDER — HYDROXYZINE HCL 25 MG PO TABS
25.0000 mg | ORAL_TABLET | Freq: Once | ORAL | Status: AC
  Administered 2016-02-26: 25 mg via ORAL

## 2016-02-26 MED ORDER — TRAZODONE HCL 100 MG PO TABS
100.0000 mg | ORAL_TABLET | Freq: Every day | ORAL | Status: DC
  Administered 2016-02-26: 100 mg via ORAL

## 2016-02-26 NOTE — ED Notes (Signed)
Report was received from Clydia Llano., RN; Pt. Verbalizes no complaints or distress;  Verbalizing having S.I. With a plan to drive his truck  out into traffic; denies having any Hi. Continue to monitor with 15 min. Monitoring.

## 2016-02-26 NOTE — ED Notes (Signed)
Pt clothes and house key stored in patient belongings bag

## 2016-02-26 NOTE — ED Notes (Signed)
Pt states that he wants to kill himself, was going to do so by cutting himself today with knife. States when released here that he is going to run out in front of a car with his truck. Wants to die. Will not elaborate on what is causing his SI threats. Pt states that he is going to leave "if the psychiatrist is not here by the time you leave-referring to 7PM"

## 2016-02-26 NOTE — ED Notes (Signed)

## 2016-02-26 NOTE — ED Notes (Signed)
Arrives with SI, hx of depression, pt not been taking his medication, ACT team arrived to find pt in his apt with a knife in his hand, in triage pt states "I dont want to live anymore", states he would hurt anyone who bothered him, states ETOH abuse

## 2016-02-26 NOTE — ED Notes (Signed)
Per patient, "I'm still going to do it; I'm going to kill myself when I leave here."

## 2016-02-26 NOTE — BH Assessment (Signed)
Assessment Note  Mathew Aguilar is an 65 y.o. male presenting to the ED with suicidal ideations with plan to either cut himself with a knife or run out in front of a car with his truck.  Pt repeatedly stated he wanted to to die but would not elaborate as to why.  He reports feelings of unhappiness and loneliness.  He states " I have nothing left to live for ".    Pt admits to drinking today.  BAC was 53.  UDS negative for drugs.  He denies any auditory/visual hallucinations.  Diagnosis: Major Depression  Past Medical History:  Past Medical History  Diagnosis Date  . Hypertension   . Depression   . Restless leg     Past Surgical History  Procedure Laterality Date  . Amputation arm      Family History:  Family History  Problem Relation Age of Onset  . Family history unknown: Yes    Social History:  reports that he has never smoked. He has never used smokeless tobacco. He reports that he does not drink alcohol or use illicit drugs.  Additional Social History:  Alcohol / Drug Use History of alcohol / drug use?: Yes Longest period of sobriety (when/how long): unknown Negative Consequences of Use: Personal relationships Substance #1 Name of Substance 1: Alcohol 1 - Age of First Use: unknown 1 - Amount (size/oz): 12 pack 1 - Frequency: whenever 1 - Duration: varies 1 - Last Use / Amount: 02/26/2016  CIWA: CIWA-Ar BP: 138/74 mmHg Pulse Rate: 71 COWS:    Allergies: No Known Allergies  Home Medications:  (Not in a hospital admission)  OB/GYN Status:  No LMP for male patient.  General Assessment Data Location of Assessment: [facility] ED TTS Assessment: In system Is this a Tele or Face-to-Face Assessment?: Face-to-Face Is this an Initial Assessment or a Re-assessment for this encounter?: Initial Assessment Marital status: Single Maiden name: n/a Is patient pregnant?: No Pregnancy Status: No Living Arrangements: Alone Can pt return to current living arrangement?:  Yes Admission Status: Voluntary Is patient capable of signing voluntary admission?: Yes Referral Source: Self/Family/Friend Insurance type: Medicare  Medical Screening Exam ([place]) Medical Exam completed: Yes  Crisis Care Plan Living Arrangements: Alone Legal Guardian: Other: Name of Psychiatrist: [facility] Name of Therapist: [facility]  Education Status Is patient currently in school?: No Current Grade: n/a Highest grade of school patient has completed: Unknown Name of school: n/a Contact person: n/a  Risk to self with the past 6 months Suicidal Ideation: Yes-Currently Present Has patient been a risk to self within the past 6 months prior to admission? : Yes Suicidal Intent: Yes-Currently Present Has patient had any suicidal intent within the past 6 months prior to admission? : Yes Is patient at risk for suicide?: Yes Suicidal Plan?: Yes-Currently Present Has patient had any suicidal plan within the past 6 months prior to admission? : No Specify Current Suicidal Plan: Pt reports a plan to walk out in front of traffic Access to Means: Yes Specify Access to Suicidal Means: Pt has to traffic What has been your use of drugs/alcohol within the last 12 months?: Alcohol Previous Attempts/Gestures: Yes How many times?: 6 Other Self Harm Risks: Alcohol abuse Triggers for Past Attempts: Unpredictable, Family contact, Other personal contacts Intentional Self Injurious Behavior: None Family Suicide History: No Recent stressful life event(s): Other (Comment) Persecutory voices/beliefs?: No Depression: Yes Depression Symptoms: Feeling worthless/self pity, Loss of interest in usual pleasures, Isolating, Tearfulness Substance abuse  history and/or treatment for substance abuse?: Yes Suicide prevention information given to non-admitted patients: Not applicable  Risk to Others within the past 6 months Homicidal Ideation: No Does patient have any lifetime risk of violence toward  others beyond the six months prior to admission? : No Thoughts of Harm to Others: No Current Homicidal Intent: No Current Homicidal Plan: No Access to Homicidal Means: No Identified Victim: None Identified History of harm to others?: No Assessment of Violence: None Noted Violent Behavior Description: None identified Does patient have access to weapons?: No Criminal Charges Pending?: No Does patient have a court date: No Is patient on probation?: No  Psychosis Hallucinations: None noted Delusions: None noted  Mental Status Report Appearance/Hygiene: In hospital gown, In scrubs, Unremarkable Eye Contact: Fair Motor Activity: Agitation, Freedom of movement, Restlessness Speech: Logical/coherent, Unremarkable Level of Consciousness: Alert Mood: Depressed, Anxious, Sad Affect: Anxious, Appropriate to circumstance, Sad Anxiety Level: Minimal Thought Processes: Coherent, Relevant Judgement: Unimpaired Orientation: Person, Place, Time, Situation, Appropriate for developmental age Obsessive Compulsive Thoughts/Behaviors: Minimal  Cognitive Functioning Concentration: Normal Memory: Recent Intact, Remote Intact IQ: Average Insight: Fair Impulse Control: Poor Appetite: Good Weight Loss: 0 Weight Gain: 0 Sleep: No Change Total Hours of Sleep: 8 Vegetative Symptoms: None  ADLScreening [facility] Assessment Services) Patient's cognitive ability adequate to safely complete daily activities?: Yes Patient able to express need for assistance with ADLs?: Yes Independently performs ADLs?: Yes (appropriate for developmental age)  Prior Inpatient Therapy Prior Inpatient Therapy: Yes Prior Therapy Dates: Multiple Dates Prior Therapy Facilty/Provider(s): [facility] Reason for Treatment: Depression and Alcohol use  Prior Outpatient Therapy Prior Outpatient Therapy: Yes Prior Therapy Dates: Current Prior Therapy Facilty/Provider(s): [facility] Reason for Treatment: Depression Does patient  have an ACCT team?: Yes Does patient have Intensive In-House Services?  : No Does patient have [facility] services? : No Does patient have P4CC services?: No  ADL Screening (condition at time of admission) Patient's cognitive ability adequate to safely complete daily activities?: Yes Patient able to express need for assistance with ADLs?: Yes Independently performs ADLs?: Yes (appropriate for developmental age)       Abuse/Neglect Assessment (Assessment to be complete while patient is alone) Physical Abuse: Denies Verbal Abuse: Denies Sexual Abuse: Denies Exploitation of patient/patient's resources: Denies Self-Neglect: Denies Values / Beliefs Cultural Requests During Hospitalization: None Spiritual Requests During Hospitalization: None Consults Spiritual Care Consult Needed: No Social Work Consult Needed: No      Additional Information 1:1 In Past 12 Months?: No CIRT Risk: No Elopement Risk: No Does patient have medical clearance?: Yes     Disposition:  Disposition Initial Assessment Completed for this Encounter: Yes Disposition of Patient: Other dispositions (ER MD ordered Psych consult) Other disposition(s): Other (Comment) (ER MD ordered Psych consult)  On Site Evaluation by:   Reviewed with Physician:    Oneita Hurt 02/26/2016 8:34 PM

## 2016-02-26 NOTE — ED Provider Notes (Signed)
[facility] Emergency Department Provider Note   ____________________________________________  Time seen: Approximately 6:35 PM  I have reviewed the triage vital signs and the nursing notes.   HISTORY  Chief Complaint Suicidal    HPI Mathew Aguilar is a 65 y.o. male  with hypertension, depression, alcohol abuse who presents for evaluation of active suicidal ideation as well as homicidal ideation, gradual onset over the past several days, constant, no modifying factors, severe. He does have a plan to potentially jump out in front of traffic or to kill him self with a knife. No chest pain, difficulty breathing, vomiting, diarrhea, fevers or chills, no recent illness. No audiovisual hallucinations.   Past Medical History  Diagnosis Date  . Hypertension   . Depression   . Restless leg     Patient Active Problem List   Diagnosis Date Noted  . Suicidal ideation 02/03/2016  . Severe recurrent major depression without psychotic features ([place]) 08/22/2015  . Major depression ([place]) 06/12/2015  . Severe recurrent major depressive disorder with psychotic features ([place]) 02/21/2015  . COPD exacerbation ([place]) 02/19/2015  . GERD (gastroesophageal reflux disease) 02/19/2015  . Alcohol dependence in early full remission ([place]) 02/18/2015  . Essential hypertension 02/18/2015  . Restless legs syndrome 02/18/2015    Past Surgical History  Procedure Laterality Date  . Amputation arm      Current Outpatient Rx  Name  Route  Sig  Dispense  Refill  . albuterol (PROVENTIL) (2.5 MG/3ML) 0.083% nebulizer solution   Inhalation   Inhale 3 mLs (2.5 mg total) into the lungs every 4 (four) hours as needed for wheezing or shortness of breath.   75 mL   12   . ARIPiprazole (ABILIFY) 20 MG tablet   Oral   Take 1 tablet (20 mg total) by mouth daily. Patient not taking: Reported on 02/02/2016   30 tablet   0   . atenolol (TENORMIN) 25 MG tablet   Oral   Take 1  tablet (25 mg total) by mouth daily.   30 tablet   0   . hydrochlorothiazide (HYDRODIURIL) 12.5 MG tablet   Oral   Take 1 tablet (12.5 mg total) by mouth daily.   30 tablet   0   . hydrOXYzine (ATARAX/VISTARIL) 25 MG tablet   Oral   Take 1 tablet (25 mg total) by mouth 3 (three) times daily. Patient not taking: Reported on 02/02/2016   90 tablet   0   . lamoTRIgine (LAMICTAL) 25 MG tablet   Oral   Take 25 mg by mouth 2 (two) times daily.         Marland Kitchen levothyroxine (SYNTHROID, LEVOTHROID) 75 MCG tablet   Oral   Take 75 mcg by mouth daily before breakfast.         . metFORMIN (GLUCOPHAGE) 500 MG tablet   Oral   Take 500 mg by mouth daily.         Marland Kitchen omeprazole (PRILOSEC) 20 MG capsule   Oral   Take 20 mg by mouth daily.         . pantoprazole (PROTONIX) 40 MG tablet   Oral   Take 1 tablet (40 mg total) by mouth 2 (two) times daily. Patient not taking: Reported on 02/02/2016   60 tablet   0   . rOPINIRole (REQUIP) 3 MG tablet   Oral   Take 1 tablet (3 mg total) by mouth at bedtime.   30 tablet   0   .  temazepam (RESTORIL) 15 MG capsule   Oral   Take 1 capsule (15 mg total) by mouth at bedtime as needed for sleep.   30 capsule   0   . traZODone (DESYREL) 100 MG tablet   Oral   Take 100 mg by mouth at bedtime as needed for sleep.         Marland Kitchen venlafaxine XR (EFFEXOR-XR) 150 MG 24 hr capsule   Oral   Take 1 capsule (150 mg total) by mouth daily with breakfast. Patient not taking: Reported on 02/02/2016   30 capsule   0     Allergies Review of patient's allergies indicates no known allergies.  Family History  Problem Relation Age of Onset  . Family history unknown: Yes    Social History Social History  Substance Use Topics  . Smoking status: Never Smoker   . Smokeless tobacco: Never Used  . Alcohol Use: No     Comment: history of ETOH abuse - sober several months    Review of Systems Constitutional: No fever/chills Eyes: No visual  changes. ENT: No sore throat. Cardiovascular: Denies chest pain. Respiratory: Denies shortness of breath. Gastrointestinal: No abdominal pain.  No nausea, no vomiting.  No diarrhea.  No constipation. Genitourinary: Negative for dysuria. Musculoskeletal: Negative for back pain. Skin: Negative for rash. Neurological: Negative for headaches, focal weakness or numbness.  10-point ROS otherwise negative.  ____________________________________________   PHYSICAL EXAM:  VITAL SIGNS: ED Triage Vitals  Enc Vitals Group     BP 02/26/16 1808 138/74 mmHg     Pulse Rate 02/26/16 1808 71     Resp 02/26/16 1808 18     Temp 02/26/16 1808 98.3 F (36.8 C)     Temp Source 02/26/16 1808 Oral     SpO2 02/26/16 1808 97 %     Weight 02/26/16 1808 160 lb (72.576 kg)     Height 02/26/16 1808 5' (1.524 m)     Head Cir --      Peak Flow --      Pain Score 02/26/16 1808 0     Pain Loc --      Pain Edu? --      Excl. in [place]? --     Constitutional: Alert and oriented. Well appearing and in no acute distress. Eyes: Conjunctivae are normal. PERRL. EOMI. Head: Atraumatic. Nose: No congestion/rhinnorhea. Mouth/Throat: Mucous membranes are moist.  Oropharynx non-erythematous. Neck: No stridor.  Supple without meningismus. Cardiovascular: Normal rate, regular rhythm. Grossly normal heart sounds.  Good peripheral circulation. Respiratory: Normal respiratory effort.  No retractions. Lungs CTAB. Gastrointestinal: Soft and nontender. No distention. No CVA tenderness. Genitourinary: deferred Musculoskeletal: No lower extremity tenderness nor edema.  No joint effusions. Neurologic:  Normal speech and language. No gross focal neurologic deficits are appreciated. No gait instability. Skin:  Skin is warm, dry and intact. No rash noted. Psychiatric: Mood and affect are normal. Speech and behavior are normal.  ____________________________________________   LABS (all labs ordered are listed, but only abnormal  results are displayed)  Labs Reviewed  COMPREHENSIVE METABOLIC PANEL - Abnormal; Notable for the following:    CO2 20 (*)    BUN 5 (*)    AST 51 (*)    ALT 75 (*)    Total Bilirubin 1.4 (*)    All other components within normal limits  ETHANOL - Abnormal; Notable for the following:    Alcohol, Ethyl (B) 53 (*)    All other components within normal limits  ACETAMINOPHEN  LEVEL - Abnormal; Notable for the following:    Acetaminophen (Tylenol), Serum <10 (*)    All other components within normal limits  CBC - Abnormal; Notable for the following:    RDW 16.3 (*)    All other components within normal limits  SALICYLATE LEVEL  URINE DRUG SCREEN, QUALITATIVE ([facility] ONLY)   ____________________________________________  EKG  none ____________________________________________  RADIOLOGY  none ____________________________________________   PROCEDURES  Procedure(s) performed: None  Critical Care performed: No  ____________________________________________   INITIAL IMPRESSION / ASSESSMENT AND PLAN / ED COURSE  Pertinent labs & imaging results that were available during my care of the patient were reviewed by me and considered in my medical decision making (see chart for details).  65 year old male with hypertension, depression, alcohol abuse who presents for evaluation of active suicidal ideation as well as homicidal ideation. His plan is to potentially run out in front of a car or to kill himself with a knife. Apparently his ACT team arrived to find him in his apartment with a knife in hand threatening to kill himself and also threatening to kill anyone who bothered him. He currently has no other acute complaints, his vital signs are stable he is afebrile. I reviewed his labs, CBC unremarkable, undetectable acetaminophen and salicylate levels.  Ethanol is mildly elevated at 53. CMP with AST and ALT elevations which are mild. The patient is medically cleared. Will place involuntary  commitment, consult psychiatry as well as behavioral health. ____________________________________________   FINAL CLINICAL IMPRESSION(S) / ED DIAGNOSES  Final diagnoses:  Suicidal ideation  Homicidal ideation      NEW MEDICATIONS STARTED DURING THIS VISIT:  New Prescriptions   No medications on file     Note:  This document was prepared using Dragon voice recognition software and may include unintentional dictation errors.    Joanne Gavel, MD 02/27/16 0030

## 2016-02-27 ENCOUNTER — Inpatient Hospital Stay
Admission: RE | Admit: 2016-02-27 | Discharge: 2016-03-02 | DRG: 885 | Disposition: A | Discharge status: Home / Self Care | Payer: Commercial Managed Care - HMO | Source: Intra-hospital | Attending: Psychiatry | Admitting: Psychiatry

## 2016-02-27 DIAGNOSIS — G2581 Restless legs syndrome: Secondary | ICD-10-CM | POA: Diagnosis present

## 2016-02-27 DIAGNOSIS — F1010 Alcohol abuse, uncomplicated: Secondary | ICD-10-CM | POA: Diagnosis present

## 2016-02-27 DIAGNOSIS — E119 Type 2 diabetes mellitus without complications: Secondary | ICD-10-CM | POA: Diagnosis present

## 2016-02-27 DIAGNOSIS — Z915 Personal history of self-harm: Secondary | ICD-10-CM | POA: Diagnosis not present

## 2016-02-27 DIAGNOSIS — E039 Hypothyroidism, unspecified: Secondary | ICD-10-CM | POA: Diagnosis present

## 2016-02-27 DIAGNOSIS — Z9119 Patient's noncompliance with other medical treatment and regimen: Secondary | ICD-10-CM

## 2016-02-27 DIAGNOSIS — Z89209 Acquired absence of unspecified upper limb, unspecified level: Secondary | ICD-10-CM

## 2016-02-27 DIAGNOSIS — I10 Essential (primary) hypertension: Secondary | ICD-10-CM | POA: Diagnosis present

## 2016-02-27 DIAGNOSIS — J449 Chronic obstructive pulmonary disease, unspecified: Secondary | ICD-10-CM | POA: Diagnosis present

## 2016-02-27 DIAGNOSIS — Z9114 Patient's other noncompliance with medication regimen: Secondary | ICD-10-CM | POA: Diagnosis not present

## 2016-02-27 DIAGNOSIS — R45851 Suicidal ideations: Secondary | ICD-10-CM | POA: Diagnosis present

## 2016-02-27 DIAGNOSIS — F332 Major depressive disorder, recurrent severe without psychotic features: Secondary | ICD-10-CM | POA: Diagnosis present

## 2016-02-27 DIAGNOSIS — K219 Gastro-esophageal reflux disease without esophagitis: Secondary | ICD-10-CM | POA: Diagnosis present

## 2016-02-27 DIAGNOSIS — G4700 Insomnia, unspecified: Secondary | ICD-10-CM | POA: Diagnosis present

## 2016-02-27 DIAGNOSIS — F333 Major depressive disorder, recurrent, severe with psychotic symptoms: Principal | ICD-10-CM | POA: Diagnosis present

## 2016-02-27 MED ORDER — ARIPIPRAZOLE 10 MG PO TABS
20.0000 mg | ORAL_TABLET | Freq: Every day | ORAL | Status: DC
  Administered 2016-02-28 – 2016-03-02 (×4): 20 mg via ORAL
  Filled 2016-02-27 (×4): qty 2

## 2016-02-27 MED ORDER — MAGNESIUM HYDROXIDE 400 MG/5ML PO SUSP: 30.0000 mL | Freq: Every day | ORAL | Status: DC | PRN

## 2016-02-27 MED ORDER — ATENOLOL 25 MG PO TABS
25.0000 mg | ORAL_TABLET | Freq: Every day | ORAL | Status: DC
  Administered 2016-02-28 – 2016-03-02 (×4): 25 mg via ORAL
  Filled 2016-02-27 (×4): qty 1

## 2016-02-27 MED ORDER — LEVOTHYROXINE SODIUM 75 MCG PO TABS
75.0000 ug | ORAL_TABLET | Freq: Every day | ORAL | Status: DC
  Administered 2016-02-27: 75 ug via ORAL
  Filled 2016-02-27: qty 1

## 2016-02-27 MED ORDER — HYDROXYZINE HCL 50 MG PO TABS
50.0000 mg | ORAL_TABLET | Freq: Two times a day (BID) | ORAL | Status: DC
  Administered 2016-02-27: 50 mg via ORAL
  Filled 2016-02-27 (×2): qty 1

## 2016-02-27 MED ORDER — HYDROCHLOROTHIAZIDE 12.5 MG PO CAPS
12.5000 mg | ORAL_CAPSULE | Freq: Every day | ORAL | Status: DC
  Administered 2016-02-28 – 2016-03-02 (×4): 12.5 mg via ORAL
  Filled 2016-02-27 (×4): qty 1

## 2016-02-27 MED ORDER — ALBUTEROL SULFATE (2.5 MG/3ML) 0.083% IN NEBU
2.5000 mg | INHALATION_SOLUTION | RESPIRATORY_TRACT | Status: DC | PRN
  Filled 2016-02-27: qty 3

## 2016-02-27 MED ORDER — VENLAFAXINE HCL ER 75 MG PO CP24
150.0000 mg | ORAL_CAPSULE | Freq: Every day | ORAL | Status: DC
  Administered 2016-02-28 – 2016-03-02 (×4): 150 mg via ORAL
  Filled 2016-02-27 (×4): qty 2

## 2016-02-27 MED ORDER — METFORMIN HCL 500 MG PO TABS
500.0000 mg | ORAL_TABLET | Freq: Every day | ORAL | Status: DC
  Administered 2016-02-27: 500 mg via ORAL
  Filled 2016-02-27: qty 1

## 2016-02-27 MED ORDER — ROPINIROLE HCL 1 MG PO TABS: 3.0000 mg | ORAL_TABLET | Freq: Every day | ORAL | Status: DC

## 2016-02-27 MED ORDER — DOXAZOSIN MESYLATE 4 MG PO TABS
2.0000 mg | ORAL_TABLET | Freq: Every day | ORAL | Status: DC
  Administered 2016-02-27: 2 mg via ORAL
  Filled 2016-02-27: qty 1

## 2016-02-27 MED ORDER — METFORMIN HCL 500 MG PO TABS
500.0000 mg | ORAL_TABLET | Freq: Every day | ORAL | Status: DC
  Administered 2016-02-28 – 2016-03-02 (×4): 500 mg via ORAL
  Filled 2016-02-27 (×4): qty 1

## 2016-02-27 MED ORDER — ATENOLOL 25 MG PO TABS
25.0000 mg | ORAL_TABLET | Freq: Every day | ORAL | Status: DC
  Administered 2016-02-27: 25 mg via ORAL
  Filled 2016-02-27: qty 1

## 2016-02-27 MED ORDER — LAMOTRIGINE 25 MG PO TABS
25.0000 mg | ORAL_TABLET | Freq: Two times a day (BID) | ORAL | Status: DC
  Administered 2016-02-27 – 2016-03-02 (×8): 25 mg via ORAL
  Filled 2016-02-27 (×8): qty 1

## 2016-02-27 MED ORDER — HYDROXYZINE HCL 50 MG PO TABS
50.0000 mg | ORAL_TABLET | Freq: Two times a day (BID) | ORAL | Status: DC
  Administered 2016-02-27 – 2016-03-02 (×8): 50 mg via ORAL
  Filled 2016-02-27 (×9): qty 1

## 2016-02-27 MED ORDER — HYDROCHLOROTHIAZIDE 12.5 MG PO CAPS
12.5000 mg | ORAL_CAPSULE | Freq: Every day | ORAL | Status: DC
  Administered 2016-02-27: 12.5 mg via ORAL
  Filled 2016-02-27: qty 1

## 2016-02-27 MED ORDER — TEMAZEPAM 15 MG PO CAPS
15.0000 mg | ORAL_CAPSULE | Freq: Every evening | ORAL | Status: DC | PRN
  Administered 2016-02-27 – 2016-03-01 (×4): 15 mg via ORAL
  Filled 2016-02-27 (×5): qty 1

## 2016-02-27 MED ORDER — ACETAMINOPHEN 325 MG PO TABS: 650.0000 mg | ORAL_TABLET | Freq: Four times a day (QID) | ORAL | Status: DC | PRN

## 2016-02-27 MED ORDER — LAMOTRIGINE 25 MG PO TABS
25.0000 mg | ORAL_TABLET | Freq: Two times a day (BID) | ORAL | Status: DC
  Administered 2016-02-27: 25 mg via ORAL
  Filled 2016-02-27: qty 1

## 2016-02-27 MED ORDER — ROPINIROLE HCL 1 MG PO TABS
3.0000 mg | ORAL_TABLET | Freq: Every day | ORAL | Status: DC
  Administered 2016-02-27 – 2016-03-01 (×4): 3 mg via ORAL
  Filled 2016-02-27 (×5): qty 3

## 2016-02-27 MED ORDER — ALBUTEROL SULFATE HFA 108 (90 BASE) MCG/ACT IN AERS
2.0000 | INHALATION_SPRAY | RESPIRATORY_TRACT | Status: DC | PRN
  Filled 2016-02-27: qty 18

## 2016-02-27 MED ORDER — TRAZODONE HCL 100 MG PO TABS
100.0000 mg | ORAL_TABLET | Freq: Every day | ORAL | Status: DC
  Administered 2016-02-27 – 2016-03-01 (×4): 100 mg via ORAL
  Filled 2016-02-27 (×4): qty 1

## 2016-02-27 MED ORDER — ARIPIPRAZOLE 10 MG PO TABS
20.0000 mg | ORAL_TABLET | Freq: Every day | ORAL | Status: DC
  Administered 2016-02-27: 20 mg via ORAL
  Filled 2016-02-27: qty 2

## 2016-02-27 MED ORDER — ALUM & MAG HYDROXIDE-SIMETH 200-200-20 MG/5ML PO SUSP: 30.0000 mL | ORAL | Status: DC | PRN

## 2016-02-27 MED ORDER — VENLAFAXINE HCL ER 75 MG PO CP24: 150.0000 mg | ORAL_CAPSULE | Freq: Every day | ORAL | Status: DC

## 2016-02-27 MED ORDER — TEMAZEPAM 15 MG PO CAPS: 15.0000 mg | ORAL_CAPSULE | Freq: Every evening | ORAL | Status: DC | PRN

## 2016-02-27 MED ORDER — LEVOTHYROXINE SODIUM 50 MCG PO TABS
75.0000 ug | ORAL_TABLET | Freq: Every day | ORAL | Status: DC
  Administered 2016-02-28 – 2016-03-02 (×5): 75 ug via ORAL
  Filled 2016-02-27 (×5): qty 1

## 2016-02-27 MED ORDER — DOXAZOSIN MESYLATE 2 MG PO TABS
2.0000 mg | ORAL_TABLET | Freq: Every day | ORAL | Status: DC
  Administered 2016-02-28 – 2016-03-02 (×4): 2 mg via ORAL
  Filled 2016-02-27 (×4): qty 1

## 2016-02-27 NOTE — ED Notes (Signed)
Pt pacing in the room. When asked pt if he was having a hard time sleeping he states he has restless leg syndrome and has not had his regular medications for a few days. Will have day shift ask Psych MD to evaluate for needed routine medications.

## 2016-02-27 NOTE — ED Notes (Signed)
BEHAVIORAL HEALTH ROUNDING  Patient sleeping: No.  Patient alert and oriented: yes  Behavior appropriate: Yes. ; If no, describe:  Nutrition and fluids offered: Yes  Toileting and hygiene offered: Yes  Sitter present: not applicable, Q 15 min safety rounds and observation via security camera. Law enforcement present: Yes ODS  

## 2016-02-27 NOTE — BH Assessment (Signed)
Patient is to be admitted to [facility] by Dr. Weber Cooks.  Attending Physician will be Dr. Bary Leriche.   Patient has been assigned to room 319-B, by [facility] Charge Nurse Jake Church.   Intake Paper Work has been signed and placed on patient chart.  ER staff is aware of the admission (Dr. Corky Downs, ER MD; Drema Halon., Patient's Nurse & Truman Hayward, Patient Access).

## 2016-02-27 NOTE — ED Notes (Signed)
Pt restless and depressed. Pt expresses SI with a plan to walk into traffic if he is released. Would not tell this writer the trigger for his depression and SI. "I don't want to talk about it."  Pt currently in dayroom. He is standing facing the wall with his head resting on the wall. No self injurious behaviors noted. Maintained on 15 minute checks and observation by security camera for safety.

## 2016-02-27 NOTE — [facility] Group Notes (Signed)
[facility] Notes:  (Nursing/MHT/Case Management/Adjunct)  Date:  02/27/2016  Time:  4:06 PM  Type of Therapy:  Movement Therapy  Participation Level:  Did Not Attend  Dhyan Noah De'Chelle Itzelle Gains 02/27/2016, 4:06 PM

## 2016-02-27 NOTE — ED Notes (Signed)
Pt is calm and pleasant with staff. Given a small snack. No concerns voiced. No distress noted. Maintained on 15 minute checks and observation by security camera for safety.

## 2016-02-27 NOTE — ED Notes (Signed)
BEHAVIORAL HEALTH ROUNDING Patient sleeping: Yes.   Patient alert and oriented: not applicable SLEEPING Behavior appropriate: Yes.  ; If no, describe: SLEEPING Nutrition and fluids offered: No SLEEPING Toileting and hygiene offered: NoSLEEPING Sitter present: not applicable, Q 15 min safety rounds and observation via security camera. Law enforcement present: Yes ODS 

## 2016-02-27 NOTE — Plan of Care (Signed)
Problem: Education: Goal: Ability to make informed decisions regarding treatment will improve Outcome: Progressing On task understanding  Questions asked .
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
## 2016-02-27 NOTE — ED Notes (Signed)
Pt compliant with medications. Pleasant with RN. Pt appears relieved to know he will be admitted. No concerns voiced. No distress noted. Maintained on 15 minute checks and observation by security camera for safety.

## 2016-02-27 NOTE — ED Notes (Signed)
BEHAVIORAL HEALTH ROUNDING  Patient sleeping: No.  Patient alert and oriented: yes  Behavior appropriate: Yes. ; If no, describe:  Nutrition and fluids offered: Yes  Toileting and hygiene offered: Yes  Sitter present: not applicable, Q 15 min safety rounds and observation via security camera. Law enforcement present: Yes ODS  Pt awake sitting on side of bed at this time.

## 2016-02-27 NOTE — ED Notes (Signed)
Report called to [facility] Meredith Mody, RN). Pt belongings will be sent with pt.

## 2016-02-27 NOTE — ED Notes (Signed)
Pt up to bathroom.

## 2016-02-27 NOTE — ED Notes (Signed)
Pt has been seen by psychiatry and will be admitted for inpatient treatment. Pt accepting. Home medications have been re started.

## 2016-02-27 NOTE — [facility] Group Notes (Signed)
[facility] Notes:  (Nursing/MHT/Case Management/Adjunct)  Date:  02/27/2016  Time:  11:38 PM  Type of Therapy:  Group Therapy  Participation Level:  None  Participation Quality:  Attentive  Affect:  Appropriate  Cognitive:  Alert and Appropriate  Insight:  Appropriate  Engagement in Group:  Limited  Modes of Intervention:  Discussion  Summary of Progress/Problems:  Saira Kramme Joy Larren Copes 02/27/2016, 11:38 PM
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
## 2016-02-27 NOTE — Tx Team (Signed)
Initial Interdisciplinary Treatment Plan   PATIENT STRESSORS: Health problems Medication change or noncompliance Substance abuse   PATIENT STRENGTHS: Ability for insight Communication skills General fund of knowledge Supportive family/friends   PROBLEM LIST: Problem List/Patient Goals Date to be addressed Date deferred Reason deferred Estimated date of resolution  Depression 615/17     Suicide 02/27/16     COPD 02/27/16     HTN 02/27/16                                    DISCHARGE CRITERIA:  Adequate post-discharge living arrangements Medical problems require only outpatient monitoring  PRELIMINARY DISCHARGE PLAN: Return to previous living arrangement Return to previous work or school arrangements  PATIENT/FAMIILY INVOLVEMENT: This treatment plan has been presented to and reviewed with the patient, Mathew Aguilar, and/or family member,  .  The patient and family have been given the opportunity to ask questions and make suggestions.  [name] [name] 02/27/2016, 3:55 PM

## 2016-02-28 DIAGNOSIS — F333 Major depressive disorder, recurrent, severe with psychotic symptoms: Principal | ICD-10-CM

## 2016-02-28 LAB — TSH: TSH: 3.154 u[IU]/mL (ref 0.350–4.500)

## 2016-02-28 LAB — LIPID PANEL
CHOLESTEROL: 139 mg/dL (ref 0–200)
HDL: 43 mg/dL (ref >40)
LDL Cholesterol: 71 mg/dL (ref 0–99)
TRIGLYCERIDES: 124 mg/dL (ref <150)
Total CHOL/HDL Ratio: 3.2 RATIO
VLDL: 25 mg/dL (ref 0–40)

## 2016-02-28 LAB — HEMOGLOBIN A1C: HEMOGLOBIN A1C: 5.2 % (ref 4.0–6.0)

## 2016-02-28 LAB — GLUCOSE, CAPILLARY: GLUCOSE-CAPILLARY: 75 mg/dL (ref 65–99)

## 2016-02-28 NOTE — BHH Counselor (Addendum)
Adult Comprehensive Assessment  Patient ID: Mathew Aguilar, male DOB: Jun 22, 1951, 65 y.o. MRN: [id number]  Information Source: Information source: Patient  Current Stressors:  Family Relationships: distant family Financial / Lack of resources (include bankruptcy): Limited income Housing / Lack of housing: has appropriate housing but unhappy at not being able to meet people the way he would like. Substance abuse:  Pt reports relapsing on alcohol. He reports drinking a 6 pack over 2 weeks.  Living/Environment/Situation:  Living Arrangements: Alone Living conditions (as described by patient or guardian): nice apartment, says it has gotten messy because he did not feel like cleaning or getting out of bed for a week How long has patient lived in current situation?: many years. What is atmosphere in current home: Comfortable, Supportive (Pt seems to crave more friendship and social contacts then he is getting.)  Family History:  Marital status: Divorced Divorced, when?: 2006 Additional relationship information: has a girlfriend name [name] that lives in [place] that he sees frequently. What is your sexual orientation?: heterosexual Has your sexual activity been affected by drugs, alcohol, medication, or emotional stress?: no Does patient have children?: Yes How many children?: 1 How is patient's relationship with their children?: Son, 24  Childhood History:  By whom was/is the patient raised?: Both parents Description of patient's relationship with caregiver when they were a child: describes parents as good people Patient's description of current relationship with people who raised him/her: Parents are both deceased. How were you disciplined when you got in trouble as a child/adolescent?: whooping, he says the ones he got he needed Does patient have siblings?: Yes Number of Siblings: 3 Description of patient's current relationship with siblings: brother, two sisters, [place]   Did patient suffer any verbal/emotional/physical/sexual abuse as a child?: No Did patient suffer from severe childhood neglect?: No Has patient ever been sexually abused/assaulted/raped as an adolescent or adult?: No Was the patient ever a victim of a crime or a disaster?: No Witnessed domestic violence?: No Has patient been effected by domestic violence as an adult?: No  Education:  Highest grade of school patient has completed: 8th grade Currently a student?: No Learning disability?: Yes  Employment/Work Situation:  Employment situation: On disability Why is patient on disability: mental health How long has patient been on disability: 100 Patient's job has been impacted by current illness: Yes Describe how patient's job has been impacted: Pt unable to work after self inflicted injury to his arm, due to mental illness. 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, tree work Has patient ever been in the [employer]?: No Has patient ever served in combat?: No Did You Receive Any Psychiatric Treatment/Services While in Passenger transport manager?: No Are There Guns or Chiropractor in [place]?: No Are These Psychologist, educational?: (N/A)  Financial Resources:  Financial resources: Teacher, early years/pre, Medicare Does patient have a Programmer, applications or guardian?: No  Alcohol/Substance Abuse:  What has been your use of drugs/alcohol within the last 12 months?: 233 days since last drink-says he just prayed and asked God to help him not want to drink anymore. If attempted suicide, did drugs/alcohol play a role in this?: No Alcohol/Substance Abuse Treatment Hx: Past Tx, Outpatient Has alcohol/substance abuse ever caused legal problems?: No  Social Support System:  Describe Community Support System: PSI Type of faith/religion: none How does patient's faith help to cope with current illness?: none  Leisure/Recreation:  Leisure and Hobbies: meeting people,  talking  Strengths/Needs:  What things does the patient do well?: help people. help neighbor who's 69 going to grocery or doctor, take her mail. In what areas does patient struggle / problems for patient: financially  Discharge Plan:  Does patient have access to transportation?: Yes Will patient be returning to same living situation after discharge?: Yes Currently receiving community mental health services: Yes (From Whom) ([facility]) If no, would patient like referral for services when discharged?: No Does patient have financial barriers related to discharge medications?: No  Summary/Recommendations:   Patient is a 65 year old male admitted  with a diagnosis of Major Depression. Patient presented to the hospital with depression and SI. Patient reports primary triggers for admission were relapse on alcohol and non compliance of medications. Patient will benefit from crisis stabilization, medication evaluation, group therapy and psycho education in addition to case management for discharge. At discharge, it is recommended that patient remain compliant with established discharge plan and continued treatment.   Wray Kearns MSW, [place]  02/29/2016

## 2016-02-28 NOTE — Progress Notes (Signed)
Recreation Therapy Notes  INPATIENT RECREATION THERAPY ASSESSMENT  Patient Details Name: EBERT BLOODSWORTH MRN: [id number] DOB: Jun 25, 1951 Today's Date: 02/28/2016  Patient Stressors: Other (Comment) (Does not like where he lives - it makes him depressed and he can't talk to anyone)  Coping Skills:   Isolate, Substance Abuse, Exercise, Art/Dance, Talking, Music, Sports  Personal Challenges: Concentration, Decision-Making, Stress Management, Substance Abuse, Time Management  Leisure Interests (2+):  Individual - Other (Comment) (Hanging out with [name], cleaning)  Awareness of Community Resources:  No  Community Resources:     Current Use:    If no, Barriers?:    Patient Strengths:  Helpful, does things for others - caring  Patient Identified Areas of Improvement:  Stop drinking  Current Recreation Participation:  Not much  Patient Goal for Hospitalization:  To get out  [place] of Residence:  [place] of Residence:  [place]   Current SI (including self-harm):  No  Current HI:  No  Consent to Intern Participation: N/A   Leonette Monarch, LRT/CTRS 02/28/2016, 5:23 PM

## 2016-02-28 NOTE — H&P (Addendum)
Psychiatric Admission Assessment Adult  Patient Identification: Mathew Aguilar MRN:  [id number] Date of Evaluation:  02/28/2016 Chief Complaint:  Depression Principal Diagnosis: Severe recurrent major depression with psychotic features [facility]) Diagnosis:   Patient Active Problem List   Diagnosis Date Noted  . Alcohol use disorder, mild, abuse [F10.10] 02/27/2016  . Severe recurrent major depression with psychotic features ([place]) [F33.3] 02/27/2016  . Suicidal ideation [R45.851] 02/03/2016  . COPD exacerbation ([place]) [J44.1] 02/19/2015  . GERD (gastroesophageal reflux disease) [K21.9] 02/19/2015  . Essential hypertension [I10] 02/18/2015  . Restless legs syndrome [G25.81] 02/18/2015   History of Present Illness:   Identifying data. Mathew Aguilar is a 65 year old married with a history of psychotic depression.  Chief complaint. "I stopped my medications."  History of present illness. Information was obtained from the patient and the chart. Mathew Aguilar has a long history of mental illness and has been advised to [facility] numerous times for worsening of depression and drinking problems. He is in the act team but reports that he stopped taking his medication and relapsed on alcohol. His blood alcohol level on admission was elevated. He reports worsening of depression with poor sleep, decreased appetite, anhedonia, feeling of guilt and worthlessness hopelessness, poor energy and concentration, social isolation, crying spells and auditory command hallucinations. It culminated in suicidal thinking the patient came to the hospital several times in the past 2 weeks reporting suicidal ideation and was eventually admitted to psychiatry. He denies heightened anxiety or symptoms suggestive of bipolar mania. Other than alcohol he denies any illicit substance or prescription pill abuse.  Past psychiatric history. No history of depression, alcoholism and suicidal thinking with suicide  attempts and multiple hospitalizations and multiple medication trials. Noncompliant the patient seems to do well on the current regimen.  Family psychiatric history. Nonreported.   Social history. He lives independently in a subsidized apartment. He works with act team. He is disabled from mental illness. 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 He used to have girlfriends all the time but is not dating presently. He has family living in the area was supportive the context is very limited.  Past medical history. GERD, hypertension, COPD, restless leg syndrome.   Total Time spent with patient: 1 hour  Is the patient at risk to self? Yes.    Has the patient been a risk to self in the past 6 months? Yes.    Has the patient been a risk to self within the distant past? No.  Is the patient a risk to others? No.  Has the patient been a risk to others in the past 6 months? No.  Has the patient been a risk to others within the distant past? No.   Prior Inpatient Therapy:   Prior Outpatient Therapy:    Alcohol Screening: 1. How often do you have a drink containing alcohol?: Monthly or less 2. How many drinks containing alcohol do you have on a typical day when you are drinking?: 3 or 4 3. How often do you have six or more drinks on one occasion?: Never Preliminary Score: 1 4. How often during the last year have you found that you were not able to stop drinking once you had started?: Never 5. How often during the last year have you failed to do what was normally expected from you becasue of drinking?: Never 6. How often during the last year  have you needed a first drink in the morning to get yourself going after a heavy drinking session?: Never 7. How often during the last year have you had a feeling of guilt of remorse after drinking?: Never 8. How often during the last year have you been unable to remember what happened the  night before because you had been drinking?: Never 9. Have you or someone else been injured as a result of your drinking?: No 10. Has a relative or friend or a doctor or another health worker been concerned about your drinking or suggested you cut down?: No Alcohol Use Disorder Identification Test Final Score (AUDIT): 2 Brief Intervention: AUDIT score less than 7 or less-screening does not suggest unhealthy drinking-brief intervention not indicated Substance Abuse History in the last 12 months:  Yes.   Consequences of Substance Abuse: Negative Previous Psychotropic Medications: Yes  Psychological Evaluations: No  Past Medical History:  Past Medical History  Diagnosis Date  . Hypertension   . Depression   . Restless leg     Past Surgical History  Procedure Laterality Date  . Amputation arm     Family History:  Family History  Problem Relation Age of Onset  . Family history unknown: Yes   Tobacco Screening: _0 ([phone])::1)@ Social History:  History  Alcohol Use No    Comment: history of ETOH abuse - sober several months     History  Drug Use No    Additional Social History:                           Allergies:  No Known Allergies Lab Results:  Results for orders placed or performed during the hospital encounter of 02/26/16 (from the past 48 hour(s))  Comprehensive metabolic panel     Status: Abnormal   Collection Time: 02/26/16  6:14 PM  Result Value Ref Range   Sodium 136 135 - 145 mmol/L   Potassium 3.8 3.5 - 5.1 mmol/L   Chloride 102 101 - 111 mmol/L   CO2 20 (L) 22 - 32 mmol/L   Glucose, Bld 81 65 - 99 mg/dL   BUN 5 (L) 6 - 20 mg/dL   Creatinine, Ser 0.68 0.61 - 1.24 mg/dL   Calcium 9.3 8.9 - 10.3 mg/dL   Total Protein 8.0 6.5 - 8.1 g/dL   Albumin 4.4 3.5 - 5.0 g/dL   AST 51 (H) 15 - 41 U/L   ALT 75 (H) 17 - 63 U/L   Alkaline Phosphatase 46 38 - 126 U/L   Total Bilirubin 1.4 (H) 0.3 - 1.2 mg/dL   GFR calc non Af Amer >60 >60 mL/min   GFR  calc Af Amer >60 >60 mL/min    Comment: (NOTE) The eGFR has been calculated using the CKD EPI equation. This calculation has not been validated in all clinical situations. eGFR's persistently <60 mL/min signify possible Chronic Kidney Disease.    Anion gap 14 5 - 15  Ethanol     Status: Abnormal   Collection Time: 02/26/16  6:14 PM  Result Value Ref Range   Alcohol, Ethyl (B) 53 (H) <5 mg/dL    Comment:        LOWEST DETECTABLE LIMIT FOR SERUM ALCOHOL IS 5 mg/dL FOR MEDICAL PURPOSES ONLY   Salicylate level     Status: None   Collection Time: 02/26/16  6:14 PM  Result Value Ref Range   Salicylate Lvl <6.7 2.8 - 30.0 mg/dL  Acetaminophen level  Status: Abnormal   Collection Time: 02/26/16  6:14 PM  Result Value Ref Range   Acetaminophen (Tylenol), Serum <10 (L) 10 - 30 ug/mL    Comment:        THERAPEUTIC CONCENTRATIONS VARY SIGNIFICANTLY. A RANGE OF 10-30 ug/mL MAY BE AN EFFECTIVE CONCENTRATION FOR MANY PATIENTS. HOWEVER, SOME ARE BEST TREATED AT CONCENTRATIONS OUTSIDE THIS RANGE. ACETAMINOPHEN CONCENTRATIONS >150 ug/mL AT 4 HOURS AFTER INGESTION AND >50 ug/mL AT 12 HOURS AFTER INGESTION ARE OFTEN ASSOCIATED WITH TOXIC REACTIONS.   cbc     Status: Abnormal   Collection Time: 02/26/16  6:14 PM  Result Value Ref Range   WBC 5.7 3.8 - 10.6 K/uL   RBC 5.79 4.40 - 5.90 MIL/uL   Hemoglobin 17.4 13.0 - 18.0 g/dL   HCT 51.0 40.0 - 52.0 %   MCV 88.2 80.0 - 100.0 fL   MCH 30.0 26.0 - 34.0 pg   MCHC 34.0 32.0 - 36.0 g/dL   RDW 16.3 (H) 11.5 - 14.5 %   Platelets 227 150 - 440 K/uL  Urine Drug Screen, Qualitative     Status: None   Collection Time: 02/26/16  7:45 PM  Result Value Ref Range   Tricyclic, Ur Screen NONE DETECTED NONE DETECTED   Amphetamines, Ur Screen NONE DETECTED NONE DETECTED   MDMA (Ecstasy)Ur Screen NONE DETECTED NONE DETECTED   Cocaine Metabolite,Ur Between NONE DETECTED NONE DETECTED   Opiate, Ur Screen NONE DETECTED NONE DETECTED   Phencyclidine (PCP)  Ur S NONE DETECTED NONE DETECTED   Cannabinoid 50 Ng, Ur [place] NONE DETECTED NONE DETECTED   Barbiturates, Ur Screen NONE DETECTED NONE DETECTED   Benzodiazepine, Ur Scrn NONE DETECTED NONE DETECTED   Methadone Scn, Ur NONE DETECTED NONE DETECTED    Comment: (NOTE) 329  Tricyclics, urine               Cutoff 1000 ng/mL 200  Amphetamines, urine             Cutoff 1000 ng/mL 300  MDMA (Ecstasy), urine           Cutoff 500 ng/mL 400  Cocaine Metabolite, urine       Cutoff 300 ng/mL 500  Opiate, urine                   Cutoff 300 ng/mL 600  Phencyclidine (PCP), urine      Cutoff 25 ng/mL 700  Cannabinoid, urine              Cutoff 50 ng/mL 800  Barbiturates, urine             Cutoff 200 ng/mL 900  Benzodiazepine, urine           Cutoff 200 ng/mL 1000 Methadone, urine                Cutoff 300 ng/mL 1100 1200 The urine drug screen provides only a preliminary, unconfirmed 1300 analytical test result and should not be used for non-medical 1400 purposes. Clinical consideration and professional judgment should 1500 be applied to any positive drug screen result due to possible 1600 interfering substances. A more specific alternate chemical method 1700 must be used in order to obtain a confirmed analytical result.  1800 Gas chromato graphy / mass spectrometry (GC/MS) is the preferred 1900 confirmatory method.     Blood Alcohol level:  Lab Results  Component Value Date   ETH 53* 02/26/2016   ETH <5 [id number]/42/6834    Metabolic Disorder Labs:  Lab Results  Component Value Date   HGBA1C 6.7* 08/22/2015   No results found for: PROLACTIN Lab Results  Component Value Date   CHOL 180 08/22/2015   TRIG 213* 08/22/2015   HDL 28* 08/22/2015   CHOLHDL 6.4 08/22/2015   VLDL 43* 08/22/2015   LDLCALC 109* 08/22/2015   LDLCALC 98 06/12/2015    Current Medications: Current Facility-Administered Medications  Medication Dose Route Frequency Provider Last Rate Last Dose  . acetaminophen (TYLENOL)  tablet 650 mg  650 mg Oral Q6H PRN Gonzella Lex, MD      . albuterol (PROVENTIL HFA;VENTOLIN HFA) 108 (90 Base) MCG/ACT inhaler 2 puff  2 puff Inhalation Q4H PRN Gonzella Lex, MD      . alum & mag hydroxide-simeth (MAALOX/MYLANTA) 200-200-20 MG/5ML suspension 30 mL  30 mL Oral Q4H PRN Gonzella Lex, MD      . ARIPiprazole (ABILIFY) tablet 20 mg  20 mg Oral Daily Gonzella Lex, MD   20 mg at 02/28/16 0858  . atenolol (TENORMIN) tablet 25 mg  25 mg Oral Daily Gonzella Lex, MD   25 mg at 02/28/16 0857  . doxazosin (CARDURA) tablet 2 mg  2 mg Oral Daily Gonzella Lex, MD   2 mg at 02/28/16 0857  . hydrochlorothiazide (MICROZIDE) capsule 12.5 mg  12.5 mg Oral Daily Gonzella Lex, MD   12.5 mg at 02/28/16 0858  . hydrOXYzine (ATARAX/VISTARIL) tablet 50 mg  50 mg Oral BID Gonzella Lex, MD   50 mg at 02/28/16 0857  . lamoTRIgine (LAMICTAL) tablet 25 mg  25 mg Oral BID Gonzella Lex, MD   25 mg at 02/28/16 0858  . levothyroxine (SYNTHROID, LEVOTHROID) tablet 75 mcg  75 mcg Oral QAC breakfast Gonzella Lex, MD   75 mcg at 02/28/16 0626  . magnesium hydroxide (MILK OF MAGNESIA) suspension 30 mL  30 mL Oral Daily PRN Gonzella Lex, MD      . metFORMIN (GLUCOPHAGE) tablet 500 mg  500 mg Oral Q breakfast Gonzella Lex, MD   500 mg at 02/28/16 0858  . rOPINIRole (REQUIP) tablet 3 mg  3 mg Oral QHS Gonzella Lex, MD   3 mg at 02/27/16 2209  . temazepam (RESTORIL) capsule 15 mg  15 mg Oral QHS PRN Gonzella Lex, MD   15 mg at 02/27/16 2343  . traZODone (DESYREL) tablet 100 mg  100 mg Oral QHS Gonzella Lex, MD   100 mg at 02/27/16 2209  . venlafaxine XR (EFFEXOR-XR) 24 hr capsule 150 mg  150 mg Oral Q breakfast Gonzella Lex, MD   150 mg at 02/28/16 6384   PTA Medications: Prescriptions prior to admission  Medication Sig Dispense Refill Last Dose  . albuterol (PROVENTIL) (2.5 MG/3ML) 0.083% nebulizer solution Inhale 3 mLs (2.5 mg total) into the lungs every 4 (four) hours as needed for  wheezing or shortness of breath. 75 mL 12 PRN at PRN  . ARIPiprazole (ABILIFY) 20 MG tablet Take 1 tablet (20 mg total) by mouth daily. (Patient not taking: Reported on 02/02/2016) 30 tablet 0 Not Taking at Unknown time  . atenolol (TENORMIN) 25 MG tablet Take 1 tablet (25 mg total) by mouth daily. (Patient not taking: Reported on 02/27/2016) 30 tablet 0 02/02/2016 at Unknown time  . doxazosin (CARDURA) 2 MG tablet Take 1 tablet by mouth daily.   unknown at unknown  . DULoxetine (CYMBALTA) 60 MG capsule Take 1 capsule by  mouth 2 (two) times daily.   unknown at unknown  . hydrochlorothiazide (HYDRODIURIL) 12.5 MG tablet Take 1 tablet (12.5 mg total) by mouth daily. (Patient not taking: Reported on 02/27/2016) 30 tablet 0 02/02/2016 at Unknown time  . hydrochlorothiazide (MICROZIDE) 12.5 MG capsule Take 1 capsule by mouth daily.   unknown at unknown  . hydrOXYzine (ATARAX/VISTARIL) 25 MG tablet Take 1 tablet (25 mg total) by mouth 3 (three) times daily. (Patient not taking: Reported on 02/27/2016) 90 tablet 0   . hydrOXYzine (VISTARIL) 50 MG capsule Take 1 capsule by mouth 2 (two) times daily.   unknown at unknown  . lamoTRIgine (LAMICTAL) 25 MG tablet Take 1 tablet by mouth 2 (two) times daily.   unknown at unknown  . levothyroxine (SYNTHROID, LEVOTHROID) 75 MCG tablet Take 1 tablet by mouth daily.   unknown at unknown  . metFORMIN (GLUCOPHAGE) 500 MG tablet Take 1 tablet by mouth daily.   unknown at unknown  . mirtazapine (REMERON) 15 MG tablet Take 1 tablet by mouth daily.   unknown at unknown  . naltrexone (DEPADE) 50 MG tablet Take 50 mg by mouth every other day.    unknown at unknown  . omeprazole (PRILOSEC) 20 MG capsule Take 1 capsule by mouth daily.   unknown at unknown  . pantoprazole (PROTONIX) 40 MG tablet Take 1 tablet (40 mg total) by mouth 2 (two) times daily. (Patient not taking: Reported on 02/02/2016) 60 tablet 0 Not Taking at Unknown time  . QUEtiapine (SEROQUEL) 100 MG tablet Take 1 tablet  by mouth daily.   unknown at unknown  . rOPINIRole (REQUIP) 3 MG tablet Take 1 tablet (3 mg total) by mouth at bedtime. 30 tablet 0 unknown at unknown  . temazepam (RESTORIL) 15 MG capsule Take 1 capsule (15 mg total) by mouth at bedtime as needed for sleep. 30 capsule 0 PRN at PRN  . traZODone (DESYREL) 100 MG tablet Take 100 mg by mouth at bedtime as needed for sleep.   PRN at PRN  . venlafaxine XR (EFFEXOR-XR) 150 MG 24 hr capsule Take 1 capsule (150 mg total) by mouth daily with breakfast. (Patient not taking: Reported on 02/02/2016) 30 capsule 0 Not Taking at Unknown time    Musculoskeletal: Strength & Muscle Tone: within normal limits Gait & Station: normal Patient leans: N/A  Psychiatric Specialty Exam: Physical Exam  Nursing note and vitals reviewed. Constitutional: He is oriented to person, place, and time. He appears well-developed and well-nourished.  HENT:  Head: Normocephalic and atraumatic.  Eyes: Conjunctivae and EOM are normal. Pupils are equal, round, and reactive to light.  Neck: Normal range of motion. Neck supple.  Cardiovascular: Normal rate, regular rhythm and normal heart sounds.   Respiratory: Effort normal and breath sounds normal.  GI: Soft. Bowel sounds are normal.  Musculoskeletal: Normal range of motion.  Neurological: He is alert and oriented to person, place, and time.  Skin: Skin is warm and dry.    Review of Systems  Psychiatric/Behavioral: Positive for depression, suicidal ideas, hallucinations and substance abuse.  All other systems reviewed and are negative.   Blood pressure 97/65, pulse 65, temperature 97.8 F (36.6 C), temperature source Oral, resp. rate 18, height 5' (1.524 m), weight 69.4 kg (153 lb), SpO2 100 %.Body mass index is 29.88 kg/(m^2).  See SRA.  Sleep:  Number of Hours: 6.5       Treatment Plan Summary: Daily contact with patient to assess and evaluate  symptoms and progress in treatment and Medication management   Mathew Aguilar is a 65 year old male with a history of depression admitted for suicidal ideation in the context of medication noncompliance and relapse on alcohol.  1. Suicidal ideation. The patient is able to contract for safety in the hospital..  2. Mood. We continued Effexor, Lamictal and Abilify for depression and psychosis.  3. Hypertension. We continued atenolol, Cordura and hydrochlorothiazide.   4. COPD. We continued inhalers.  5. GERD. We continued Protonix.  6. Restless leg syndrome. We continued Requip.  7. Insomnia. We offered Trazodone.   8. Hypothyroidism. We'll continue Synthroid.   9. Diabetes. He is on metformin.   10. Alcohol abuse. We will monitor for symptoms of withdrawal. The patient would benefit from [facility] IOP program participation.   11. Metabolic syndromes screening. Lipid profile and TSH are normal, hemoglobin A1c and prolactin are pending.  12. Disposition. The patient was discharged to home. He will follow up with ACT team    Observation Level/Precautions:  15 minute checks  Laboratory:  CBC Chemistry Profile UDS UA  Psychotherapy:    Medications:    Consultations:    Discharge Concerns:    Estimated LOS:  Other:     I certify that inpatient services furnished can reasonably be expected to improve the patient's condition.    Orson Slick, MD 6/16/20178:59 AM

## 2016-02-28 NOTE — Tx Team (Signed)
Interdisciplinary Treatment Plan Update (Adult)  Date:  02/28/2016 Time Reviewed:  5:08 PM  Progress in Treatment: Attending groups: Yes. Participating in groups:  Yes. Taking medication as prescribed:  Yes. Tolerating medication:  Yes. Family/Significant othe contact made:  No, will contact:  pt refused  Patient understands diagnosis:  Yes. Discussing patient identified problems/goals with staff:  Yes. Medical problems stabilized or resolved:  Yes. Denies suicidal/homicidal ideation: Yes. Issues/concerns per patient self-inventory:  Yes. Other:  New problem(s) identified: No, Describe:  NA  Discharge Plan or Barriers: Pt plans to return home and follow up with outpatient.    Reason for Continuation of Hospitalization: Depression Medication stabilization Suicidal ideation  Comments:Mathew Aguilar has a long history of mental illness and has been advised to [facility] numerous times for worsening of depression and drinking problems. He is in the act team but reports that he stopped taking his medication and relapsed on alcohol. His blood alcohol level on admission was elevated. He reports worsening of depression with poor sleep, decreased appetite, anhedonia, feeling of guilt and worthlessness hopelessness, poor energy and concentration, social isolation, crying spells and auditory command hallucinations. It culminated in suicidal thinking the patient came to the hospital several times in the past 2 weeks reporting suicidal ideation and was eventually admitted to psychiatry. He denies heightened anxiety or symptoms suggestive of bipolar mania. Other than alcohol he denies any illicit substance or prescription pill abuse.  Estimated length of stay: 5 days   New goal(s): NA  Review of initial/current patient goals per problem list:   1.  Goal(s): Patient will participate in aftercare plan * Met:  * Target date: at discharge * As evidenced by: Patient will  participate within aftercare plan AEB aftercare provider and housing plan at discharge being identified.   2.  Goal (s): Patient will exhibit decreased depressive symptoms and suicidal ideations. * Met:  *  Target date: at discharge * As evidenced by: Patient will utilize self rating of depression at 3 or below and demonstrate decreased signs of depression or be deemed stable for discharge by MD.   3.  Goal(s): Patient will demonstrate decreased signs and symptoms of anxiety. * Met:  * Target date: at discharge * As evidenced by: Patient will utilize self rating of anxiety at 3 or below and demonstrated decreased signs of anxiety, or be deemed stable for discharge by MD   4.  Goal(s): Patient will demonstrate decreased signs of withdrawal due to substance abuse * Met:  * Target date: at discharge * As evidenced by: Patient will produce a CIWA/COWS score of 0, have stable vitals signs, and no symptoms of withdrawal.   Attendees: Patient:  Mathew Aguilar 6/16/20175:08 PM  Family:   6/16/20175:08 PM  Physician:   Dr. Bary Leriche  6/16/20175:08 PM  Nursing:   Chrys Racer, RN  6/16/20175:08 PM  Case Manager:   6/16/20175:08 PM  Counselor:   6/16/20175:08 PM  Other:  Wray Kearns, [place] 6/16/20175:08 PM  Other:  Everitt Amber, [place]  6/16/20175:08 PM  Other:   6/16/20175:08 PM  Other:  6/16/20175:08 PM  Other:  6/16/20175:08 PM  Other:  6/16/20175:08 PM  Other:  6/16/20175:08 PM  Other:  6/16/20175:08 PM  Other:  6/16/20175:08 PM  Other:   6/16/20175:08 PM   Scribe for Treatment Team:   Wray Kearns, MSW, [place]  02/28/2016, 5:08 PM

## 2016-02-28 NOTE — BHH Group Notes (Signed)
[place] LCSW Group Therapy  02/28/2016 5:15 PM  Type of Therapy:  Group Therapy  Participation Level:  Minimal  Participation Quality:  Attentive  Affect:  Appropriate  Cognitive:  Alert  Insight:  Limited   Engagement in Therapy:  Limited   Modes of Intervention:  Discussion, Education, Socialization and Support  Summary of Progress/Problems: Feelings around Relapse. Group members discussed the meaning of relapse and shared personal stories of relapse, how it affected them and others, and how they perceived themselves during this time. Group members were encouraged to identify triggers, warning signs and coping skills used when facing the possibility of relapse. Social supports were discussed and explored in detail. Pt attended group and stayed the entire time. Pt sat quietly and listened to other group members share.    [place] /MSW, LCSWA  02/28/2016, 5:15 PM

## 2016-02-28 NOTE — Plan of Care (Signed)
Problem: Coping: Goal: Ability to verbalize feelings will improve Outcome: Not Progressing Pt minimally verbalizes feelings.

## 2016-02-28 NOTE — BHH Group Notes (Signed)
[place] Group Notes:  (Nursing/MHT/Case Management/Adjunct)  Date:  02/28/2016  Time:  5:29 PM  Type of Therapy:  Psychoeducational Skills  Participation Level:  Minimal  Participation Quality:  Attentive  Affect:  Flat  Cognitive:  Appropriate  Insight:  Appropriate  Engagement in Group:  None  Modes of Intervention:  Discussion and Education  Summary of Progress/Problems:  Mathew Aguilar 02/28/2016, 5:29 PM

## 2016-02-28 NOTE — BHH Suicide Risk Assessment (Addendum)
[facility] Admission Suicide Risk Assessment   Nursing information obtained from:    Demographic factors:    Current Mental Status:    Loss Factors:    Historical Factors:    Risk Reduction Factors:     Total Time spent with patient: 1 hour Principal Problem: Severe recurrent major depression with psychotic features [facility]) Diagnosis:   Patient Active Problem List   Diagnosis Date Noted  . Alcohol use disorder, mild, abuse [F10.10] 02/27/2016  . Severe recurrent major depression with psychotic features ([place]) [F33.3] 02/27/2016  . Suicidal ideation [R45.851] 02/03/2016  . COPD exacerbation ([place]) [J44.1] 02/19/2015  . GERD (gastroesophageal reflux disease) [K21.9] 02/19/2015  . Essential hypertension [I10] 02/18/2015  . Restless legs syndrome [G25.81] 02/18/2015   Subjective Data: Hallucinations, suicidal ideation, alcohol abuse, treatment noncompliance.  Continued Clinical Symptoms:  Alcohol Use Disorder Identification Test Final Score (AUDIT): 2 The "Alcohol Use Disorders Identification Test", Guidelines for Use in Primary Care, Second Edition.  World Pharmacologist [facility]). Score between 0-7:  no or low risk or alcohol related problems. Score between 8-15:  moderate risk of alcohol related problems. Score between 16-19:  high risk of alcohol related problems. Score 20 or above:  warrants further diagnostic evaluation for alcohol dependence and treatment.   CLINICAL FACTORS:   Depression:   Comorbid alcohol abuse/dependence Impulsivity Alcohol/Substance Abuse/Dependencies Currently Psychotic   Musculoskeletal: Strength & Muscle Tone: within normal limits Gait & Station: normal Patient leans: N/A  Psychiatric Specialty Exam: Physical Exam  Nursing note and vitals reviewed.   Review of Systems  Psychiatric/Behavioral: Positive for depression, suicidal ideas, hallucinations and substance abuse.  All other systems reviewed and are negative.   Blood pressure 97/65, pulse  65, temperature 97.8 F (36.6 C), temperature source Oral, resp. rate 18, height 5' (1.524 m), weight 69.4 kg (153 lb), SpO2 100 %.Body mass index is 29.88 kg/(m^2).  General Appearance: Casual  Eye Contact:  Good  Speech:  Clear and Coherent  Volume:  Normal  Mood:  Depressed, Hopeless and Worthless  Affect:  Blunt  Thought Process:  Goal Directed  Orientation:  Full (Time, Place, and Person)  Thought Content:  Hallucinations: Auditory  Suicidal Thoughts:  Yes.  with intent/plan  Homicidal Thoughts:  No  Memory:  Immediate;   Fair Recent;   Fair Remote;   Fair  Judgement:  Fair  Insight:  Fair  Psychomotor Activity:  Normal  Concentration:  Concentration: Fair and Attention Span: Fair  Recall:  [employer] of Knowledge:  Fair  Language:  Fair  Akathisia:  No  Handed:  Right  AIMS (if indicated):     Assets:  Communication Skills Desire for Improvement Financial Resources/Insurance Housing Physical Health Resilience Social Support  ADL's:  Intact  Cognition:  WNL  Sleep:  Number of Hours: 6.5      COGNITIVE FEATURES THAT CONTRIBUTE TO RISK:  None    SUICIDE RISK:   Moderate:  Frequent suicidal ideation with limited intensity, and duration, some specificity in terms of plans, no associated intent, good self-control, limited dysphoria/symptomatology, some risk factors present, and identifiable protective factors, including available and accessible social support.  PLAN OF CARE: Hospital admission, medication management, absence abuse counseling, discharge planning.  Mr. Tetterton is a 65 year old male with a history of depression admitted for suicidal ideation in the context of medication noncompliance and relapse on alcohol.   1. Suicidal ideation. The patient is able to contract for safety in the hospital..  2. Mood. We continued Effexor,  Lamictal and Abilify for depression and psychosis.  3. Hypertension. We continued atenolol, Cordura and hydrochlorothiazide.    4. COPD. We continued inhalers.  5. GERD. We continued Protonix.  6. Restless leg syndrome. We continued Requip.  7. Insomnia. We offered Trazodone.   8. Hypothyroidism. We'll continue Synthroid.   9. Diabetes. He is on metformin.   10. Disposition. The patient was discharged to home. He will follow up with ACT team   I certify that inpatient services furnished can reasonably be expected to improve the patient's condition.   Orson Slick, MD 02/28/2016, 8:51 AM

## 2016-02-28 NOTE — Progress Notes (Signed)
Recreation Therapy Notes  Date: 06.16.17 Time: 1:00 pm Location: Craft Room  Group Topic: Communication, Problem solving, Teamwork  Goal Area(s) Addresses:  Patient will effective work with peer towards shared goal. Patient will identify skills used to make activities successful. Patient will identify benefit of using group skills effectively post d/c.  Behavioral Response: Did not attend  Intervention: [employer]  Activity: Patients were given 15 pipe cleaners and instructed to build a free standing tower. Patients were given 2 minutes to strategize. Once patients got the pipe cleaners, after about 7 minutes, they were instructed to put their dominant hand behind their back. After about 5 more minutes, they were instructed not to talk to each other.  Education: LRT educated patients on support systems.  Education Outcome: Patient did not attend group.  Clinical Observations/Feedback: Patient did not attend group.  Leonette Monarch, LRT/CTRS 02/28/2016 4:52 PM

## 2016-02-28 NOTE — Progress Notes (Signed)
D: Patient is pleasant upon approach.  He states his depressive symptoms has decreased.  He denies any self harm thoughts.  He denies AVH/HI.  Patient plans to stay through the weekend.  His CBG was 75 at 1700.  He has been compliant with his medications.  He is observed in the milieu interacting with his peers.   A: Continue to monitor medication management and MD orders.  Safety checks completed every 15 minutes per protocol.  R: Offer support and encouragement as needed.

## 2016-02-28 NOTE — Progress Notes (Signed)
D: Observed pt in day room . Patient alert and oriented x4. Patient denies SI/HI/AVH. Pt affect is anxious, depressed, and sullen. Pt stated his mood is "not so good," and when asked about it pt replied "it just ain't" and would not elaborate further. Pt rated depression 7/10 and anxiety "just a little." Pt ended conversation short stating "I just like to be by myself."  Pt asked nurse for sleep at at 2340. A: Offered active listening and support. Provided therapeutic communication. Administered scheduled medications. Encouraged pt to attend groups and actively participate in care. Gave restoril prn. R: Pt pleasant and cooperative. Pt medication compliant. Will continue Q15 min. checks. Safety maintained.

## 2016-02-29 LAB — GLUCOSE, CAPILLARY
GLUCOSE-CAPILLARY: 87 mg/dL (ref 65–99)
GLUCOSE-CAPILLARY: 64 mg/dL — AB (ref 65–99)
Glucose-Capillary: 90 mg/dL (ref 65–99)

## 2016-02-29 LAB — PROLACTIN: PROLACTIN: 6.2 ng/mL (ref 4.0–15.2)

## 2016-02-29 MED ORDER — SELENIUM SULFIDE 1 % EX LOTN
TOPICAL_LOTION | Freq: Every day | CUTANEOUS | Status: DC
  Administered 2016-03-01: 08:00:00 via TOPICAL
  Filled 2016-02-29: qty 207

## 2016-02-29 NOTE — BHH Group Notes (Signed)
[place] LCSW Group Therapy  02/29/2016 2:21 PM  Type of Therapy:  Group Therapy  Participation Level:  Did Not Attend  Modes of Intervention:  Activity, Discussion, Education, Socialization and Support  Summary of Progress/Problems: Pt will identify unhealthy thoughts and how they impact their emotions and behavior. Pt will be encouraged to discuss these thoughts, emotions and behaviors with the group.    [place] MSW, [place]  02/29/2016, 2:21 PM

## 2016-02-29 NOTE — Progress Notes (Signed)
Pt alert and oriented. Denies SI/AVH. States symptoms of depression are improving. Attended evening group. Appropriate interaction with peers and staff. Denies pain and voices no additional concerns at this time. Safety maintained. Will continue to monitor.

## 2016-02-29 NOTE — Progress Notes (Signed)
Pt has been pleasant and cooperative. Pt talked about events leading to hospitalization. Pt's mood and affect has been depressed . Pt has  been active on the unit attending most groups. Pt denies SI and A/V hallucinations. Will continue to observe and maintain a safe environment.

## 2016-02-29 NOTE — BHH Group Notes (Signed)
[place] Group Notes:  (Nursing/MHT/Case Management/Adjunct)  Date:  02/29/2016  Time:  11:35 PM  Type of Therapy:  Psychoeducational Skills  Participation Level:  Minimal  Participation Quality:  Appropriate  Affect:  Appropriate  Cognitive:  Appropriate  Insight:  Limited  Engagement in Group:  Limited  Modes of Intervention:  Discussion, Socialization and Support  Summary of Progress/Problems: Patient did not want to participate in group today. Mathew Aguilar 02/29/2016, 11:35 PM

## 2016-02-29 NOTE — BHH Group Notes (Signed)
[place] Group Notes:  (Nursing/MHT/Case Management/Adjunct)  Date:  02/29/2016  Time:  1:43 AM  Type of Therapy:  Psychoeducational Skills  Participation Level:  Minimal  Participation Quality:  Resistant  Affect:  Flat  Cognitive:  Appropriate  Insight:  Good  Engagement in Group:  Lacking and Limited  Modes of Intervention:  Discussion and Education  Summary of Progress/Problems:  Kathi Ludwig 02/29/2016, 1:43 AM

## 2016-02-29 NOTE — Progress Notes (Addendum)
[facility] MD Progress Note  02/29/2016 11:17 AM Mathew Aguilar  MRN:  [id number] Subjective:  Patient was calm, pleasant and cooperative. He denies having any plans with mood, appetite, energy is sleep or concentration. He denies side effects from medications. Denies any physical complaints. Denies suicidality, homicidality or having auditory visual hallucinations  Per nursing: Pt alert and oriented. Denies SI/AVH. States symptoms of depression are improving. Attended evening group. Appropriate interaction with peers and staff. Denies pain and voices no additional concerns at this time. Safety maintained. Will continue to monitor.  Principal Problem: Severe recurrent major depression with psychotic features [facility]) Diagnosis:   Patient Active Problem List   Diagnosis Date Noted  . Alcohol use disorder, mild, abuse [F10.10] 02/27/2016  . Severe recurrent major depression with psychotic features ([place]) [F33.3] 02/27/2016  . Suicidal ideation [R45.851] 02/03/2016  . COPD exacerbation ([place]) [J44.1] 02/19/2015  . GERD (gastroesophageal reflux disease) [K21.9] 02/19/2015  . Essential hypertension [I10] 02/18/2015  . Restless legs syndrome [G25.81] 02/18/2015   Total Time spent with patient: 30 minutes  Past Psychiatric History:   Past Medical History:  Past Medical History  Diagnosis Date  . Hypertension   . Depression   . Restless leg     Past Surgical History  Procedure Laterality Date  . Amputation arm     Family History:  Family History  Problem Relation Age of Onset  . Family history unknown: Yes   Family Psychiatric  History:  Social History:  History  Alcohol Use No    Comment: history of ETOH abuse - sober several months     History  Drug Use No    Social History   Social History  . Marital Status: Single    Spouse Name: N/A  . Number of Children: N/A  . Years of Education: N/A   Social History Main Topics  . Smoking status: Never Smoker   . Smokeless tobacco:  Never Used  . Alcohol Use: No     Comment: history of ETOH abuse - sober several months  . Drug Use: No  . Sexual Activity: Not Currently    Birth Control/ Protection: None   Other Topics Concern  . None   Social History Narrative     Current Medications: Current Facility-Administered Medications  Medication Dose Route Frequency Provider Last Rate Last Dose  . acetaminophen (TYLENOL) tablet 650 mg  650 mg Oral Q6H PRN Gonzella Lex, MD      . albuterol (PROVENTIL HFA;VENTOLIN HFA) 108 (90 Base) MCG/ACT inhaler 2 puff  2 puff Inhalation Q4H PRN Gonzella Lex, MD      . alum & mag hydroxide-simeth (MAALOX/MYLANTA) 200-200-20 MG/5ML suspension 30 mL  30 mL Oral Q4H PRN Gonzella Lex, MD      . ARIPiprazole (ABILIFY) tablet 20 mg  20 mg Oral Daily Gonzella Lex, MD   20 mg at 02/29/16 0829  . atenolol (TENORMIN) tablet 25 mg  25 mg Oral Daily Gonzella Lex, MD   25 mg at 02/29/16 0830  . doxazosin (CARDURA) tablet 2 mg  2 mg Oral Daily Gonzella Lex, MD   2 mg at 02/29/16 [id number]  . hydrochlorothiazide (MICROZIDE) capsule 12.5 mg  12.5 mg Oral Daily Gonzella Lex, MD   12.5 mg at 02/29/16 0829  . hydrOXYzine (ATARAX/VISTARIL) tablet 50 mg  50 mg Oral BID Gonzella Lex, MD   50 mg at 02/29/16 0829  . lamoTRIgine (LAMICTAL) tablet 25 mg  25 mg  Oral BID Gonzella Lex, MD   25 mg at 02/29/16 0827  . levothyroxine (SYNTHROID, LEVOTHROID) tablet 75 mcg  75 mcg Oral QAC breakfast Gonzella Lex, MD   75 mcg at 02/29/16 0828  . magnesium hydroxide (MILK OF MAGNESIA) suspension 30 mL  30 mL Oral Daily PRN Gonzella Lex, MD      . metFORMIN (GLUCOPHAGE) tablet 500 mg  500 mg Oral Q breakfast Gonzella Lex, MD   500 mg at 02/29/16 0828  . rOPINIRole (REQUIP) tablet 3 mg  3 mg Oral QHS Gonzella Lex, MD   3 mg at 02/28/16 2201  . selenium sulfide (SELSUN) 1 % shampoo   Topical Daily [name], MD      . temazepam (RESTORIL) capsule 15 mg  15 mg Oral QHS PRN Gonzella Lex, MD    15 mg at 02/28/16 2202  . traZODone (DESYREL) tablet 100 mg  100 mg Oral QHS Gonzella Lex, MD   100 mg at 02/28/16 2201  . venlafaxine XR (EFFEXOR-XR) 24 hr capsule 150 mg  150 mg Oral Q breakfast Gonzella Lex, MD   150 mg at 02/29/16 0830    Lab Results:  Results for orders placed or performed during the hospital encounter of 02/27/16 (from the past 48 hour(s))  Hemoglobin A1c     Status: None   Collection Time: 02/28/16  7:24 AM  Result Value Ref Range   Hgb A1c MFr Bld 5.2 4.0 - 6.0 %  Lipid panel     Status: None   Collection Time: 02/28/16  7:24 AM  Result Value Ref Range   Cholesterol 139 0 - 200 mg/dL   Triglycerides 124 <150 mg/dL   HDL 43 >40 mg/dL   Total CHOL/HDL Ratio 3.2 RATIO   VLDL 25 0 - 40 mg/dL   LDL Cholesterol 71 0 - 99 mg/dL    Comment:        Total Cholesterol/HDL:CHD Risk Coronary Heart Disease Risk Table                     Men   Women  1/2 Average Risk   3.4   3.3  Average Risk       5.0   4.4  2 X Average Risk   9.6   7.1  3 X Average Risk  23.4   11.0        Use the calculated Patient Ratio above and the CHD Risk Table to determine the patient's CHD Risk.        ATP III CLASSIFICATION (LDL):  <100     mg/dL   Optimal  100-129  mg/dL   Near or Above                    Optimal  130-159  mg/dL   Borderline  160-189  mg/dL   High  >190     mg/dL   Very High   Prolactin     Status: None   Collection Time: 02/28/16  7:24 AM  Result Value Ref Range   Prolactin 6.2 4.0 - 15.2 ng/mL    Comment: (NOTE) Performed At: [facility] [place], [place] [id number] Lindon Romp MD [id number]   TSH     Status: None   Collection Time: 02/28/16  7:24 AM  Result Value Ref Range   TSH 3.154 0.350 - 4.500 uIU/mL  Glucose, capillary     Status:  None   Collection Time: 02/28/16  4:57 PM  Result Value Ref Range   Glucose-Capillary 75 65 - 99 mg/dL  Glucose, capillary     Status: None   Collection Time: 02/29/16  6:26 AM   Result Value Ref Range   Glucose-Capillary 87 65 - 99 mg/dL    Blood Alcohol level:  Lab Results  Component Value Date   ETH 53* 02/26/2016   ETH <5 02/02/2016    Physical Findings: AIMS: Facial and Oral Movements Muscles of Facial Expression: None, normal Lips and Perioral Area: None, normal Jaw: None, normal Tongue: None, normal,Extremity Movements Upper (arms, wrists, hands, fingers): None, normal Lower (legs, knees, ankles, toes): None, normal, Trunk Movements Neck, shoulders, hips: None, normal, Overall Severity Severity of abnormal movements (highest score from questions above): None, normal Incapacitation due to abnormal movements: None, normal Patient's awareness of abnormal movements (rate only patient's report): No Awareness, Dental Status Current problems with teeth and/or dentures?: No Does patient usually wear dentures?: No  CIWA:    COWS:     Musculoskeletal: Strength & Muscle Tone: within normal limits Gait & Station: normal Patient leans: N/A  Psychiatric Specialty Exam: Physical Exam  Constitutional: He is oriented to person, place, and time. He appears well-developed and well-nourished.  Respiratory: Effort normal.  Neurological: He is alert and oriented to person, place, and time.    Review of Systems  Constitutional: Negative.   HENT: Negative.   Eyes: Negative.   Respiratory: Negative.   Cardiovascular: Negative.   Gastrointestinal: Negative.   Genitourinary: Negative.   Musculoskeletal: Negative.   Skin: Negative.   Neurological: Negative.   Psychiatric/Behavioral: Negative.     Blood pressure 116/70, pulse 59, temperature 97.8 F (36.6 C), temperature source Oral, resp. rate 16, height 5' (1.524 m), weight 69.4 kg (153 lb), SpO2 100 %.Body mass index is 29.88 kg/(m^2).  General Appearance: Fairly Groomed  Eye Contact:  Good  Speech:  Clear and Coherent  Volume:  Normal  Mood:  Euthymic  Affect:  Congruent  Thought Process:  Linear  and Descriptions of Associations: Intact  Orientation:  Full (Time, Place, and Person)  Thought Content:  Hallucinations: None  Suicidal Thoughts:  No  Homicidal Thoughts:  No  Memory:  Immediate;   Fair Recent;   Fair Remote;   Fair  Judgement:  Fair  Insight:  Fair  Psychomotor Activity:  Normal  Concentration:  Concentration: Fair and Attention Span: Fair  Recall:  [employer] of Knowledge:  Fair  Language:  Fair  Akathisia:  No  Handed:    AIMS (if indicated):     Assets:  [employer] logistics/support/administrative officer Social Support  ADL's:  Intact  Cognition:  WNL  Sleep:  Number of Hours: 6.5     Treatment Plan Summary:  Mathew Aguilar is a 65 year old male with a history of depression admitted for suicidal ideation in the context of medication noncompliance and relapse on alcohol.  1. Suicidal ideation. The patient is able to contract for safety in the hospital..  2. Mood. We continued Effexor, Lamictal and Abilify for depression and psychosis.  3. Hypertension. We continued atenolol, Cordura and hydrochlorothiazide.   4. COPD. We continued inhalers.  5. GERD. We continued Protonix.  6. Restless leg syndrome. We continued Requip.  7. Insomnia. We offered Trazodone.   8. Hypothyroidism. We'll continue Synthroid.   9. Diabetes. He is on metformin.   10. Alcohol abuse. We will monitor for symptoms of withdrawal. The patient would benefit from  [facility] IOP program participation.   11. Metabolic syndromes screening. Lipid profile and TSH are normal, hemoglobin A1c and prolactin are pending.  12. Disposition. The patient was discharged to home. He will follow up with ACT team   Improving. Stable doing well. No changes will be made to his regimen.  [name], MD 02/29/2016, 11:17 AM

## 2016-03-01 LAB — GLUCOSE, CAPILLARY: Glucose-Capillary: 82 mg/dL (ref 65–99)

## 2016-03-01 NOTE — BHH Suicide Risk Assessment (Signed)
[place] INPATIENT:  Family/Significant Other Suicide Prevention Education  Suicide Prevention Education:  Patient Refusal for Family/Significant Other Suicide Prevention Education: The patient Mathew Aguilar has refused to provide written consent for family/significant other to be provided Family/Significant Other Suicide Prevention Education during admission and/or prior to discharge.  Physician notified.  [place] MSW, [place]  03/01/2016, 3:37 PM

## 2016-03-01 NOTE — Progress Notes (Signed)
Pt pleasant and cooperative. Affect bright. Visible in milieu interacting appropriately with peers. Denies any SI/AVH. Denies pain. Medication compliant. Safety maintained.

## 2016-03-01 NOTE — Progress Notes (Signed)
Pt has been pleasant and cooperative. Pt talked about events leading to hospitalization. Pt's mood and affect has been less depressed . Pt has been active on the unit attending all unit activities. Pt denies SI and A/V hallucinations. Will continue to observe and maintain a safe environment.

## 2016-03-01 NOTE — Progress Notes (Signed)
[facility] MD Progress Note  03/01/2016 10:45 AM QUINDELL REUTHER  MRN:  [id number] Subjective:  Patient was calm, pleasant and cooperative. He denies having any plans with mood, appetite, energy is sleep or concentration. He denies side effects from medications. Denies any physical complaints. Denies suicidality, homicidality or having auditory visual hallucinations  Per nursing: Pt denies any SI/AVH. Pleasant and attended evening group. Compliant with medications. Visible in milieu with appropriate interaction with staff and peers. Pt states " I am feeling better now that I have had some sleep". Pt requested PRN Restoril for sleep; it was effective. Voices no additional concerns at this time. Safety maintained. Will continue to monitor.  Principal Problem: Severe recurrent major depression with psychotic features [facility]) Diagnosis:   Patient Active Problem List   Diagnosis Date Noted  . Alcohol use disorder, mild, abuse [F10.10] 02/27/2016  . Severe recurrent major depression with psychotic features ([place]) [F33.3] 02/27/2016  . Suicidal ideation [R45.851] 02/03/2016  . COPD exacerbation ([place]) [J44.1] 02/19/2015  . GERD (gastroesophageal reflux disease) [K21.9] 02/19/2015  . Essential hypertension [I10] 02/18/2015  . Restless legs syndrome [G25.81] 02/18/2015   Total Time spent with patient: 30 minutes  Past Psychiatric History:   Past Medical History:  Past Medical History  Diagnosis Date  . Hypertension   . Depression   . Restless leg     Past Surgical History  Procedure Laterality Date  . Amputation arm     Family History:  Family History  Problem Relation Age of Onset  . Family history unknown: Yes   Family Psychiatric  History:  Social History:  History  Alcohol Use No    Comment: history of ETOH abuse - sober several months     History  Drug Use No    Social History   Social History  . Marital Status: Single    Spouse Name: N/A  . Number of Children: N/A  . Years  of Education: N/A   Social History Main Topics  . Smoking status: Never Smoker   . Smokeless tobacco: Never Used  . Alcohol Use: No     Comment: history of ETOH abuse - sober several months  . Drug Use: No  . Sexual Activity: Not Currently    Birth Control/ Protection: None   Other Topics Concern  . None   Social History Narrative     Current Medications: Current Facility-Administered Medications  Medication Dose Route Frequency Provider Last Rate Last Dose  . acetaminophen (TYLENOL) tablet 650 mg  650 mg Oral Q6H PRN Gonzella Lex, MD      . albuterol (PROVENTIL HFA;VENTOLIN HFA) 108 (90 Base) MCG/ACT inhaler 2 puff  2 puff Inhalation Q4H PRN Gonzella Lex, MD      . alum & mag hydroxide-simeth (MAALOX/MYLANTA) 200-200-20 MG/5ML suspension 30 mL  30 mL Oral Q4H PRN Gonzella Lex, MD      . ARIPiprazole (ABILIFY) tablet 20 mg  20 mg Oral Daily Gonzella Lex, MD   20 mg at 03/01/16 0816  . atenolol (TENORMIN) tablet 25 mg  25 mg Oral Daily Gonzella Lex, MD   25 mg at 03/01/16 0817  . doxazosin (CARDURA) tablet 2 mg  2 mg Oral Daily Gonzella Lex, MD   2 mg at 03/01/16 0816  . hydrochlorothiazide (MICROZIDE) capsule 12.5 mg  12.5 mg Oral Daily Gonzella Lex, MD   12.5 mg at 03/01/16 0817  . hydrOXYzine (ATARAX/VISTARIL) tablet 50 mg  50 mg Oral BID  Gonzella Lex, MD   50 mg at 03/01/16 0817  . lamoTRIgine (LAMICTAL) tablet 25 mg  25 mg Oral BID Gonzella Lex, MD   25 mg at 03/01/16 0815  . levothyroxine (SYNTHROID, LEVOTHROID) tablet 75 mcg  75 mcg Oral QAC breakfast Gonzella Lex, MD   75 mcg at 03/01/16 0616  . magnesium hydroxide (MILK OF MAGNESIA) suspension 30 mL  30 mL Oral Daily PRN Gonzella Lex, MD      . metFORMIN (GLUCOPHAGE) tablet 500 mg  500 mg Oral Q breakfast Gonzella Lex, MD   500 mg at 03/01/16 0815  . rOPINIRole (REQUIP) tablet 3 mg  3 mg Oral QHS Gonzella Lex, MD   3 mg at 02/29/16 2132  . selenium sulfide (SELSUN) 1 % shampoo   Topical Daily [name], MD      . temazepam (RESTORIL) capsule 15 mg  15 mg Oral QHS PRN Gonzella Lex, MD   15 mg at 02/29/16 2135  . traZODone (DESYREL) tablet 100 mg  100 mg Oral QHS Gonzella Lex, MD   100 mg at 02/29/16 2133  . venlafaxine XR (EFFEXOR-XR) 24 hr capsule 150 mg  150 mg Oral Q breakfast Gonzella Lex, MD   150 mg at 03/01/16 [id number]    Lab Results:  Results for orders placed or performed during the hospital encounter of 02/27/16 (from the past 48 hour(s))  Glucose, capillary     Status: None   Collection Time: 02/28/16  4:57 PM  Result Value Ref Range   Glucose-Capillary 75 65 - 99 mg/dL  Glucose, capillary     Status: None   Collection Time: 02/29/16  6:26 AM  Result Value Ref Range   Glucose-Capillary 87 65 - 99 mg/dL  Glucose, capillary     Status: None   Collection Time: 02/29/16 11:59 AM  Result Value Ref Range   Glucose-Capillary 90 65 - 99 mg/dL  Glucose, capillary     Status: Abnormal   Collection Time: 02/29/16  4:31 PM  Result Value Ref Range   Glucose-Capillary 64 (L) 65 - 99 mg/dL  Glucose, capillary     Status: None   Collection Time: 03/01/16  6:17 AM  Result Value Ref Range   Glucose-Capillary 82 65 - 99 mg/dL   Comment 1 Notify RN     Blood Alcohol level:  Lab Results  Component Value Date   ETH 53* 02/26/2016   ETH <5 02/02/2016    Physical Findings: AIMS: Facial and Oral Movements Muscles of Facial Expression: None, normal Lips and Perioral Area: None, normal Jaw: None, normal Tongue: None, normal,Extremity Movements Upper (arms, wrists, hands, fingers): None, normal Lower (legs, knees, ankles, toes): None, normal, Trunk Movements Neck, shoulders, hips: None, normal, Overall Severity Severity of abnormal movements (highest score from questions above): None, normal Incapacitation due to abnormal movements: None, normal Patient's awareness of abnormal movements (rate only patient's report): No Awareness, Dental Status Current problems  with teeth and/or dentures?: No Does patient usually wear dentures?: No  CIWA:    COWS:     Musculoskeletal: Strength & Muscle Tone: within normal limits Gait & Station: normal Patient leans: N/A  Psychiatric Specialty Exam: Physical Exam  Constitutional: He is oriented to person, place, and time. He appears well-developed and well-nourished.  Respiratory: Effort normal.  Neurological: He is alert and oriented to person, place, and time.    Review of Systems  Constitutional: Negative.   HENT: Negative.  Eyes: Negative.   Respiratory: Negative.   Cardiovascular: Negative.   Gastrointestinal: Negative.   Genitourinary: Negative.   Musculoskeletal: Negative.   Skin: Negative.   Neurological: Negative.   Psychiatric/Behavioral: Negative.     Blood pressure 128/67, pulse 55, temperature 97.8 F (36.6 C), temperature source Oral, resp. rate 16, height 5' (1.524 m), weight 69.4 kg (153 lb), SpO2 100 %.Body mass index is 29.88 kg/(m^2).  General Appearance: Fairly Groomed  Eye Contact:  Good  Speech:  Clear and Coherent  Volume:  Normal  Mood:  Euthymic  Affect:  Congruent  Thought Process:  Linear and Descriptions of Associations: Intact  Orientation:  Full (Time, Place, and Person)  Thought Content:  Hallucinations: None  Suicidal Thoughts:  No  Homicidal Thoughts:  No  Memory:  Immediate;   Fair Recent;   Fair Remote;   Fair  Judgement:  Fair  Insight:  Fair  Psychomotor Activity:  Normal  Concentration:  Concentration: Fair and Attention Span: Fair  Recall:  [employer] of Knowledge:  Fair  Language:  Fair  Akathisia:  No  Handed:    AIMS (if indicated):     Assets:  Armed forces logistics/support/administrative officer Social Support  ADL's:  Intact  Cognition:  WNL  Sleep:  Number of Hours: 6.45     Treatment Plan Summary:  Mr. Bachmann is a 65 year old male with a history of depression admitted for suicidal ideation in the context of medication noncompliance and relapse on  alcohol.  1. Suicidal ideation. The patient is able to contract for safety in the hospital..  2. Mood. We continued Effexor, Lamictal and Abilify for depression and psychosis.  3. Hypertension. We continued atenolol, Cordura and hydrochlorothiazide.   4. COPD. We continued inhalers.  5. GERD. We continued Protonix.  6. Restless leg syndrome. We continued Requip.  7. Insomnia. We offered Trazodone.   8. Hypothyroidism. We'll continue Synthroid.   9. Diabetes. He is on metformin.   10. Alcohol abuse. We will monitor for symptoms of withdrawal. The patient would benefit from [facility] IOP program participation.   11. Metabolic syndromes screening. Lipid profile and TSH are normal, hemoglobin A1c and prolactin are pending.  12. Disposition. The patient was discharged to home. He will follow up with ACT team   Improving. Stable doing well. No changes will be made to his regimen. Patient had no issues or complaints today. He is feeling well.  [name], MD 03/01/2016, 10:45 AM

## 2016-03-01 NOTE — Progress Notes (Signed)
Pt denies any SI/AVH. Pleasant and attended evening group. Compliant with medications. Visible in milieu with appropriate interaction with staff and peers. Pt states " I am feeling better now that I have had some sleep". Pt requested PRN Restoril for sleep; it was effective. Voices no additional concerns at this time. Safety maintained. Will continue to monitor.

## 2016-03-01 NOTE — [facility] Group Notes (Signed)
[facility] LCSW Group Therapy  03/01/2016 2:21 PM  Type of Therapy:  Group Therapy  Participation Level:  Minimal  Participation Quality:  Attentive  Affect:  Appropriate  Cognitive:  Alert  Insight:  Limited  Engagement in Therapy:  Limited  Modes of Intervention:  Discussion, Education, Socialization and Support  Summary of Progress/Problems: Self esteem: Patients discussed self esteem and how it impacts them. They discussed what aspects in their lives has influenced their self esteem. They were challenged to identify changes that are needed in order to improve self esteem.  Pt attended group and stayed the entire time. Pt sat quietly and listened to other group member share.   [place] MSW, [place]  03/01/2016, 2:21 PM

## 2016-03-01 NOTE — Progress Notes (Signed)
Pt continues to be pleasant and cooperative. Pt denies all complaints of discomfort. All CBGs done from  02/28/2016 to 03/01/2016 have been within normal limits.

## 2016-03-02 NOTE — Discharge Summary (Signed)
Physician Discharge Summary Note  Patient:  Mathew Aguilar is an 65 y.o., male MRN:  [id number] DOB:  1951/01/17 Patient phone:  [phone] (home)  Patient address:   [address]  [place] [address],  Total Time spent with patient: 30 minutes  Date of Admission:  02/27/2016 Date of Discharge: 03/02/2016  Reason for Admission:  Suicidal ideation.  Identifying data. [name] is a 65 year old married with a history of psychotic depression.  Chief complaint. "I stopped my medications."  History of present illness. Information was obtained from the patient and the chart. [name] has a long history of mental illness and has been advised to [facility] numerous times for worsening of depression and drinking problems. He is in the act team but reports that he stopped taking his medication and relapsed on alcohol. His blood alcohol level on admission was elevated. He reports worsening of depression with poor sleep, decreased appetite, anhedonia, feeling of guilt and worthlessness hopelessness, poor energy and concentration, social isolation, crying spells and auditory command hallucinations. It culminated in suicidal thinking the patient came to the hospital several times in the past 2 weeks reporting suicidal ideation and was eventually admitted to psychiatry. He denies heightened anxiety or symptoms suggestive of bipolar mania. Other than alcohol he denies any illicit substance or prescription pill abuse.  Past psychiatric history. No history of depression, alcoholism and suicidal thinking with suicide attempts and multiple hospitalizations and multiple medication trials. Noncompliant the patient seems to do well on the current regimen.  Family psychiatric history. Nonreported.   Social history. He lives independently in a subsidized apartment. He works with act team. He is disabled from mental illness. 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 He used to have girlfriends all the time but is not dating presently. He has family living in the area was supportive the context is very limited.  Past medical history. GERD, hypertension, COPD, restless leg syndrome.   Principal Problem: Severe recurrent major depression with psychotic features [facility]) Discharge Diagnoses: Patient Active Problem List   Diagnosis Date Noted  . Alcohol use disorder, mild, abuse [F10.10] 02/27/2016  . Severe recurrent major depression with psychotic features ([place]) [F33.3] 02/27/2016  . Suicidal ideation [R45.851] 02/03/2016  . COPD exacerbation ([place]) [J44.1] 02/19/2015  . GERD (gastroesophageal reflux disease) [K21.9] 02/19/2015  . Essential hypertension [I10] 02/18/2015  . Restless legs syndrome [G25.81] 02/18/2015    Past Medical History:  Past Medical History  Diagnosis Date  . Hypertension   . Depression   . Restless leg     Past Surgical History  Procedure Laterality Date  . Amputation arm     Family History:  Family History  Problem Relation Age of Onset  . Family history unknown: Yes    Social History:  History  Alcohol Use No    Comment: history of ETOH abuse - sober several months     History  Drug Use No    Social History   Social History  . Marital Status: Single    Spouse Name: N/A  . Number of Children: N/A  . Years of Education: N/A   Social History Main Topics  . Smoking status: Never Smoker   . Smokeless tobacco: Never Used  . Alcohol Use: No     Comment: history of ETOH abuse - sober several months  . Drug Use: No  .  Sexual Activity: Not Currently    Birth Control/ Protection: None   Other Topics Concern  . None   Social History Narrative    Hospital Course:    [name] is a 65 year old male with a history of depression admitted for suicidal ideation in the context of medication noncompliance and relapse on alcohol.  1. Suicidal  ideation. This hasresolved. The patient is able to contract for safety in the hospital. He is forward thinking and optimistic about the future.   2. Mood. We continued Effexor, Lamictal and Abilify for depression and psychosis.  3. Hypertension. We continued atenolol, Cordura and hydrochlorothiazide.   4. COPD. We continued inhalers.  5. GERD. We continued Protonix.  6. Restless leg syndrome. We continued Requip.  7. Insomnia. We offered Trazodone and Restoril.   8. Hypothyroidism. We'll continue Synthroid.   9. Diabetes. He is on metformin.   10. Alcohol abuse. He did not require detox. Vital signs were stable.    11. Metabolic syndromes screening. Lipid profile, TSH, hemoglobin A1c and prolactin are normal.   12. Disposition. The patient was discharged to home. He will follow up with ACT team   Physical Findings: AIMS: Facial and Oral Movements Muscles of Facial Expression: None, normal Lips and Perioral Area: None, normal Jaw: None, normal Tongue: None, normal,Extremity Movements Upper (arms, wrists, hands, fingers): None, normal Lower (legs, knees, ankles, toes): None, normal, Trunk Movements Neck, shoulders, hips: None, normal, Overall Severity Severity of abnormal movements (highest score from questions above): None, normal Incapacitation due to abnormal movements: None, normal Patient's awareness of abnormal movements (rate only patient's report): No Awareness, Dental Status Current problems with teeth and/or dentures?: No Does patient usually wear dentures?: No  CIWA:    COWS:     Musculoskeletal: Strength & Muscle Tone: within normal limits Gait & Station: normal Patient leans: N/A  Psychiatric Specialty Exam: Physical Exam  Nursing note and vitals reviewed.   Review of Systems  Psychiatric/Behavioral: Positive for substance abuse. The patient is nervous/anxious.   All other systems reviewed and are negative.   Blood pressure 128/67, pulse 55,  temperature 97.8 F (36.6 C), temperature source Oral, resp. rate 16, height 5' (1.524 m), weight 69.4 kg (153 lb), SpO2 100 %.Body mass index is 29.88 kg/(m^2).  See SRA.                                                  Sleep:  Number of Hours: 6.45     Have you used any form of tobacco in the last 30 days? (Cigarettes, Smokeless Tobacco, Cigars, and/or Pipes): No  Has this patient used any form of tobacco in the last 30 days? (Cigarettes, Smokeless Tobacco, Cigars, and/or Pipes) Yes, No  Blood Alcohol level:  Lab Results  Component Value Date   ETH 53* 02/26/2016   ETH <5 [id number]    Metabolic Disorder Labs:  Lab Results  Component Value Date   HGBA1C 5.2 02/28/2016   Lab Results  Component Value Date   PROLACTIN 6.2 02/28/2016   Lab Results  Component Value Date   CHOL 139 02/28/2016   TRIG 124 02/28/2016   HDL 43 02/28/2016   CHOLHDL 3.2 02/28/2016   VLDL 25 02/28/2016   LDLCALC 71 02/28/2016   LDLCALC 109* 08/22/2015    See Psychiatric Specialty Exam and Suicide Risk Assessment completed  by Attending Physician prior to discharge.  Discharge destination:  Home  Is patient on multiple antipsychotic therapies at discharge:  No   Has Patient had three or more failed trials of antipsychotic monotherapy by history:  No  Recommended Plan for Multiple Antipsychotic Therapies: NA  Discharge Instructions    Diet - low sodium heart healthy    Complete by:  As directed      Increase activity slowly    Complete by:  As directed             Medication List    STOP taking these medications        hydrOXYzine 25 MG tablet  Commonly known as:  ATARAX/VISTARIL     hydrOXYzine 50 MG capsule  Commonly known as:  VISTARIL     mirtazapine 15 MG tablet  Commonly known as:  REMERON     pantoprazole 40 MG tablet  Commonly known as:  PROTONIX     QUEtiapine 100 MG tablet  Commonly known as:  SEROQUEL      TAKE these medications       Indication   albuterol (2.5 MG/3ML) 0.083% nebulizer solution  Commonly known as:  PROVENTIL  Inhale 3 mLs (2.5 mg total) into the lungs every 4 (four) hours as needed for wheezing or shortness of breath.      ARIPiprazole 20 MG tablet  Commonly known as:  ABILIFY  Take 1 tablet (20 mg total) by mouth daily.   Indication:  Major Depressive Disorder     atenolol 25 MG tablet  Commonly known as:  TENORMIN  Take 1 tablet (25 mg total) by mouth daily.   Indication:  High Blood Pressure     doxazosin 2 MG tablet  Commonly known as:  CARDURA  Take 1 tablet by mouth daily.      DULoxetine 60 MG capsule  Commonly known as:  CYMBALTA  Take 1 capsule by mouth 2 (two) times daily.      hydrochlorothiazide 12.5 MG tablet  Commonly known as:  HYDRODIURIL  Take 1 tablet (12.5 mg total) by mouth daily.   Indication:  High Blood Pressure     hydrochlorothiazide 12.5 MG capsule  Commonly known as:  MICROZIDE  Take 1 capsule by mouth daily.      lamoTRIgine 25 MG tablet  Commonly known as:  LAMICTAL  Take 1 tablet by mouth 2 (two) times daily.      levothyroxine 75 MCG tablet  Commonly known as:  SYNTHROID, LEVOTHROID  Take 1 tablet by mouth daily.      metFORMIN 500 MG tablet  Commonly known as:  GLUCOPHAGE  Take 1 tablet by mouth daily.      naltrexone 50 MG tablet  Commonly known as:  DEPADE  Take 50 mg by mouth every other day.      omeprazole 20 MG capsule  Commonly known as:  PRILOSEC  Take 1 capsule by mouth daily.      rOPINIRole 3 MG tablet  Commonly known as:  REQUIP  Take 1 tablet (3 mg total) by mouth at bedtime.   Indication:  Restless Leg Syndrome     temazepam 15 MG capsule  Commonly known as:  RESTORIL  Take 1 capsule (15 mg total) by mouth at bedtime as needed for sleep.   Indication:  Trouble Sleeping     traZODone 100 MG tablet  Commonly known as:  DESYREL  Take 100 mg by mouth at bedtime as needed for sleep.  venlafaxine XR 150 MG 24 hr capsule   Commonly known as:  EFFEXOR-XR  Take 1 capsule (150 mg total) by mouth daily with breakfast.   Indication:  Major Depressive Disorder         Follow-up recommendations:  Activity:  as tolerated. Diet:  low sodium heart healthy. Other:  keep follow up appointments.  Comments:     Signed: Orson Slick, MD 03/02/2016, 5:32 AM

## 2016-03-02 NOTE — Progress Notes (Signed)
  [facility] Adult Case Management Discharge Plan :  Will you be returning to the same living situation after discharge:  Yes,    At discharge, do you have transportation home?: Yes,   ACTT team to pick up Do you have the ability to pay for your medications: Yes,     Release of information consent forms completed and in the chart;  Patient's signature needed at discharge.  Patient to Follow up at: Follow-up Information    Follow up with [place].   Why:  ACTT team to pick up 03/02/16 and transport home, will continue to follow for Hospital Follow up, Outpatient Medication Management ACTT services   Contact information:   [address] [place], [place] [address] Phone: [phone] Fax: [phone]      Next level of care provider has access to [place] [facility] and Suicide Prevention discussed: Yes,     Have you used any form of tobacco in the last 30 days? (Cigarettes, Smokeless Tobacco, Cigars, and/or Pipes): No  Has patient been referred to the Quitline?: N/A patient is not a smoker  Patient has been referred for addiction treatment: Yes  Janitza Revuelta, Carloyn Jaeger, MSW, LCSW 03/02/2016, 2:25 PM

## 2016-03-02 NOTE — Progress Notes (Signed)
Recreation Therapy Notes  Date: 06.19.17 Time: 1:00 pm Location: Craft Room  Group Topic: Wellness  Goal Area(s) Addresses:  Patient will identify at least one item per dimension of health. Patient will examine areas they are deficient in.  Behavioral Response: Attentive  Intervention: 6 Dimensions of Health  Activity: Patients were given a definition sheet of the different dimensions of health. Patients were given a worksheet with each dimension on it and instructed to write 2-3 things they are currently doing in each dimension.  Education: LRT educated patients on how to increase each dimension of health.  Education Outcome: Acknowledges education/In group clarification offered  Clinical Observations/Feedback: Patient completed activity by telling LRT what to write in each dimension. Patient did not contribute to group discussion.  Leonette Monarch, LRT/CTRS 03/02/2016 3:40 PM

## 2016-03-02 NOTE — BHH Suicide Risk Assessment (Signed)
[facility] Discharge Suicide Risk Assessment   Principal Problem: Severe recurrent major depression with psychotic features [facility] Discharge Diagnoses:  Patient Active Problem List   Diagnosis Date Noted  . Alcohol use disorder, mild, abuse [F10.10] 02/27/2016  . Severe recurrent major depression with psychotic features ([place]) [F33.3] 02/27/2016  . Suicidal ideation [R45.851] 02/03/2016  . COPD exacerbation ([place]) [J44.1] 02/19/2015  . GERD (gastroesophageal reflux disease) [K21.9] 02/19/2015  . Essential hypertension [I10] 02/18/2015  . Restless legs syndrome [G25.81] 02/18/2015    Total Time spent with patient: 30 minutes  Musculoskeletal: Strength & Muscle Tone: within normal limits Gait & Station: normal Patient leans: N/A  Psychiatric Specialty Exam: Review of Systems  Psychiatric/Behavioral: Positive for depression and substance abuse.  All other systems reviewed and are negative.   Blood pressure 128/67, pulse 55, temperature 97.8 F (36.6 C), temperature source Oral, resp. rate 16, height 5' (1.524 m), weight 69.4 kg (153 lb), SpO2 100 %.Body mass index is 29.88 kg/(m^2).  General Appearance: Casual  Eye Contact::  Good  Speech:  Clear and [id number]  Volume:  Normal  Mood:  Euthymic  Affect:  Appropriate  Thought Process:  Goal Directed  Orientation:  Full (Time, Place, and Person)  Thought Content:  WDL  Suicidal Thoughts:  No  Homicidal Thoughts:  No  Memory:  Immediate;   Fair Recent;   Fair Remote;   Fair  Judgement:  Fair  Insight:  Present  Psychomotor Activity:  Normal  Concentration:  Fair  Recall:  [employer] [place]  Language: Fair  Akathisia:  No  Handed:  Right  AIMS (if indicated):     Assets:  Communication Skills Desire for Improvement Financial Resources/Insurance Housing Physical Health Resilience Social Support  Sleep:  Number of Hours: 6.45  Cognition: WNL  ADL's:  Intact   Mental Status Per Nursing Assessment::   On  Admission:     Demographic Factors:  Male, Age 17 or older, Caucasian and Living alone  Loss Factors: NA  Historical Factors: Prior suicide attempts and Impulsivity  Risk Reduction Factors:   Sense of responsibility to family, Positive social support and Positive therapeutic relationship  Continued Clinical Symptoms:  Depression:   Impulsivity  Cognitive Features That Contribute To Risk:  None    Suicide Risk:  Minimal: No identifiable suicidal ideation.  Patients presenting with no risk factors but with morbid ruminations; may be classified as minimal risk based on the severity of the depressive symptoms    Plan Of Care/Follow-up recommendations:  Activity:  as tolerated. Diet:  low sodium heart healthy. Other:  keep follow up appointments.  Orson Slick, MD 03/02/2016, 5:28 AM

## 2016-03-02 NOTE — Progress Notes (Signed)
Pleasant and cooperative.  Denies SI/HI/AVH.  Smiles easily upon approach.l  Verbalizes "If I take my medicines off the table I forget to take them"  States that he is going to start leaving them on the table.   Discharge instructions given.  Verbalized understanding.  Personal belongings returned.  Escorted off unit by this Probation officer to meet ACT team to be transported home.

## 2016-03-02 NOTE — Plan of Care (Signed)
Problem: [facility] Participation in Recreation Therapeutic Interventions Goal: STG-Patient will identify at least five coping skills for ** STG: Coping Skills - Within 3 treatment sessions, patient will verbalize at least 5 coping skills for substance abuse in one treatment session to decrease substance abuse post d/c.  Outcome: Completed/Met Date Met:  03/02/16 Treatment Session 1; Completed 1 out of 1: At approximately 11:15 am, LRT met with patient in craft room. Patient verbalized 5 coping skills for substance abuse. LRT educated patient on leisure and why it is important to implement into his schedule. LRT educated and provided patient with blank schedules to help him plan his day and try to avoid using substances. LRT educated patient on healthy support systems.  Leonette Monarch, LRT/CTRS 06.19.17 12:26 pm

## 2016-03-02 NOTE — Progress Notes (Signed)
Recreation Therapy Notes  INPATIENT RECREATION TR PLAN  Patient Details Name: Mathew Aguilar MRN: [id number] DOB: 01-16-1951 Today's Date: 03/02/2016  Rec Therapy Plan Is patient appropriate for Therapeutic Recreation?: Yes Treatment times per week: At least once a week TR Treatment/Interventions: 1:1 session, Group participation (Comment) (Appropriate participation in daily recreational therapy tx)  Discharge Criteria Pt will be discharged from therapy if:: Treatment goals are met, Discharged Treatment plan/goals/alternatives discussed and agreed upon by:: Patient/family  Discharge Summary Short term goals set: See Care Plan Short term goals met: Complete Progress toward goals comments: One-to-one attended Which groups?: Wellness One-to-one attended: Coping skills Reason goals not met: N/A Therapeutic equipment acquired: None Reason patient discharged from therapy: Discharge from hospital Pt/family agrees with progress & goals achieved: Yes Date patient discharged from therapy: 03/02/16   Leonette Monarch, LRT/CTRS 03/02/2016, 3:47 PM

## 2016-03-02 NOTE — BHH Counselor (Signed)
[place] LCSW Group Therapy  03/02/2016 11:07 AM  Type of Therapy:  Group Therapy  Participation Level:  Active  Participation Quality:  Appropriate  Affect:  Appropriate  Cognitive:  Appropriate  Insight:  Developing/Improving  Engagement in Therapy:  Developing/Improving  Modes of Intervention:  Discussion, Education, Exploration and Socialization  Summary of Progress/Problems: LCSW introduced group rules and had each group member introduce themselves and state what obstacles they face  and encouraged them to discuss their thoughts, feelings and behaviours related to these obstacles. This group member indicated that depression is his biggest obstacle. The other group members as a collective were engaged with patient and provided ideas to facilitate the changes to assist pt in overcoming pts obstacles. This patient was very polite, respectful in group and supported their peers. Patient was able to develop coping stratagies call his workers, take medications, exercise.  [place] 03/02/2016, 11:07 AM

## 2016-03-02 NOTE — Tx Team (Signed)
Interdisciplinary Treatment Plan Update (Adult)  Date:  03/02/2016 Time Reviewed:  2:22 PM  Progress in Treatment: Attending groups: Yes. Participating in groups:  Yes. Taking medication as prescribed:  Yes. Tolerating medication:  Yes. Family/Significant othe contact made:  No, will contact:  Pt refused Patient understands diagnosis:  Yes. Discussing patient identified problems/goals with staff:  Yes. Medical problems stabilized or resolved:  Yes. Denies suicidal/homicidal ideation: Yes. Issues/concerns per patient self-inventory:  No. Other:  New problem(s) identified: No, Describe:     Discharge Plan or [place] home with [facility] follow up.  Reason for Continuation of Hospitalization: NONE, Pt discharging today  Comments:[name] has a long history of mental illness and has been advised to [facility] numerous times for worsening of depression and drinking problems. He is in the act team but reports that he stopped taking his medication and relapsed on alcohol. His blood alcohol level on admission was elevated. He reports worsening of depression with poor sleep, decreased appetite, anhedonia, feeling of guilt and worthlessness hopelessness, poor energy and concentration, social isolation, crying spells and auditory command hallucinations. It culminated in suicidal thinking the patient came to the hospital several times in the past 2 weeks reporting suicidal ideation and was eventually admitted to psychiatry. He denies heightened anxiety or symptoms suggestive of bipolar mania. Other than alcohol he denies any illicit substance or prescription pill abuse.  Estimated length of stay:0 days  New goal(s):  Review of initial/current patient goals per problem list:   1. Goal(s): Patient will participate in aftercare plan  Met:yes  Target date: at discharge  As evidenced by: Patient will participate within aftercare plan AEB aftercare provider and  housing plan at discharge being identified.  2. Goal (s): Patient will exhibit decreased depressive symptoms and suicidal ideations.  Met:yes  Target date: at discharge  As evidenced by: Patient will utilize self rating of depression at 3 or below and demonstrate decreased signs of depression or be deemed stable for discharge by MD.  3. Goal(s): Patient will demonstrate decreased signs and symptoms of anxiety.  Met:yes  Target date: at discharge  As evidenced by: Patient will utilize self rating of anxiety at 3 or below and demonstrated decreased signs of anxiety, or be deemed stable for discharge by MD  4. Goal(s): Patient will demonstrate decreased signs of withdrawal due to substance abuse  Met:yes  Target date:at discharge  As evidenced by: Patient will produce a CIWA/COWS score of 0, have stable vitals signs, and no symptoms of withdrawal.  Attendees: Patient:  Mathew Aguilar 6/19/20172:22 PM  Family:   6/19/20172:22 PM  Physician:  Orson Slick 6/19/20172:22 PM  Nursing:   Elige Radon, RN 6/19/20172:22 PM  Case Manager:   6/19/20172:22 PM  Counselor:  Dossie Arbour, LCSW 6/19/20172:22 PM  Other:  [place] Amber, [place] 6/19/20172:22 PM  Other:   6/19/20172:22 PM  Other:   6/19/20172:22 PM  Other:  6/19/20172:22 PM  Other:  6/19/20172:22 PM  Other:  6/19/20172:22 PM  Other:  6/19/20172:22 PM  Other:  6/19/20172:22 PM  Other:  6/19/20172:22 PM  Other:   6/19/20172:22 PM   Scribe for Treatment Team:   August Saucer, 03/02/2016, 2:22 PM, MSW, LCSW

## 2016-04-02 ENCOUNTER — Encounter: Payer: Self-pay | Admitting: Emergency Medicine

## 2016-04-02 ENCOUNTER — Emergency Department
Admission: EM | Admit: 2016-04-02 | Discharge: 2016-04-02 | Disposition: A | Discharge status: Other Acute Inpatient Hospital | Payer: Medicare Other | Attending: Emergency Medicine | Admitting: Emergency Medicine

## 2016-04-02 DIAGNOSIS — Z79899 Other long term (current) drug therapy: Secondary | ICD-10-CM | POA: Diagnosis not present

## 2016-04-02 DIAGNOSIS — F1010 Alcohol abuse, uncomplicated: Secondary | ICD-10-CM | POA: Diagnosis present

## 2016-04-02 DIAGNOSIS — K219 Gastro-esophageal reflux disease without esophagitis: Secondary | ICD-10-CM | POA: Diagnosis present

## 2016-04-02 DIAGNOSIS — J441 Chronic obstructive pulmonary disease with (acute) exacerbation: Secondary | ICD-10-CM | POA: Insufficient documentation

## 2016-04-02 DIAGNOSIS — I10 Essential (primary) hypertension: Secondary | ICD-10-CM | POA: Diagnosis not present

## 2016-04-02 DIAGNOSIS — F332 Major depressive disorder, recurrent severe without psychotic features: Secondary | ICD-10-CM | POA: Insufficient documentation

## 2016-04-02 DIAGNOSIS — F333 Major depressive disorder, recurrent, severe with psychotic symptoms: Secondary | ICD-10-CM | POA: Diagnosis not present

## 2016-04-02 DIAGNOSIS — R45851 Suicidal ideations: Secondary | ICD-10-CM | POA: Insufficient documentation

## 2016-04-02 LAB — CBC
HCT: 53.4 % — ABNORMAL HIGH (ref 40.0–52.0)
HEMOGLOBIN: 18.4 g/dL — AB (ref 13.0–18.0)
MCH: 31.2 pg (ref 26.0–34.0)
MCHC: 34.5 g/dL (ref 32.0–36.0)
MCV: 90.3 fL (ref 80.0–100.0)
PLATELETS: 213 10*3/uL (ref 150–440)
RBC: 5.92 MIL/uL — ABNORMAL HIGH (ref 4.40–5.90)
RDW: 14.7 % — ABNORMAL HIGH (ref 11.5–14.5)
WBC: 5.6 10*3/uL (ref 3.8–10.6)

## 2016-04-02 LAB — COMPREHENSIVE METABOLIC PANEL
ALBUMIN: 4.2 g/dL (ref 3.5–5.0)
ALK PHOS: 48 U/L (ref 38–126)
ALT: 71 U/L — AB (ref 17–63)
ANION GAP: 9 (ref 5–15)
AST: 45 U/L — ABNORMAL HIGH (ref 15–41)
BUN: 13 mg/dL (ref 6–20)
CHLORIDE: 102 mmol/L (ref 101–111)
CO2: 26 mmol/L (ref 22–32)
CREATININE: 0.73 mg/dL (ref 0.61–1.24)
Calcium: 9.5 mg/dL (ref 8.9–10.3)
GFR calc non Af Amer: >60 mL/min (ref >60)
GLUCOSE: 80 mg/dL (ref 65–99)
Potassium: 3.9 mmol/L (ref 3.5–5.1)
SODIUM: 137 mmol/L (ref 135–145)
Total Bilirubin: 1.4 mg/dL — ABNORMAL HIGH (ref 0.3–1.2)
Total Protein: 8.2 g/dL — ABNORMAL HIGH (ref 6.5–8.1)

## 2016-04-02 LAB — SALICYLATE LEVEL

## 2016-04-02 LAB — ETHANOL: Alcohol, Ethyl (B): <5 mg/dL (ref <5)

## 2016-04-02 LAB — GLUCOSE, CAPILLARY: GLUCOSE-CAPILLARY: 123 mg/dL — AB (ref 65–99)

## 2016-04-02 LAB — ACETAMINOPHEN LEVEL

## 2016-04-02 MED ORDER — DOXAZOSIN MESYLATE 2 MG PO TABS
2.0000 mg | ORAL_TABLET | Freq: Every day | ORAL | Status: DC
  Administered 2016-04-02: 2 mg via ORAL
  Filled 2016-04-02 (×2): qty 1

## 2016-04-02 MED ORDER — DULOXETINE HCL 60 MG PO CPEP
60.0000 mg | ORAL_CAPSULE | Freq: Two times a day (BID) | ORAL | Status: DC
  Administered 2016-04-02: 60 mg via ORAL
  Filled 2016-04-02: qty 1

## 2016-04-02 MED ORDER — LAMOTRIGINE 25 MG PO TABS
25.0000 mg | ORAL_TABLET | Freq: Two times a day (BID) | ORAL | Status: DC
  Administered 2016-04-02: 25 mg via ORAL
  Filled 2016-04-02: qty 1

## 2016-04-02 MED ORDER — METFORMIN HCL 500 MG PO TABS
500.0000 mg | ORAL_TABLET | Freq: Every day | ORAL | Status: DC
  Administered 2016-04-02: 500 mg via ORAL
  Filled 2016-04-02: qty 1

## 2016-04-02 MED ORDER — TEMAZEPAM 7.5 MG PO CAPS: 15.0000 mg | ORAL_CAPSULE | Freq: Every evening | ORAL | Status: DC | PRN

## 2016-04-02 MED ORDER — PANTOPRAZOLE SODIUM 40 MG PO TBEC
40.0000 mg | DELAYED_RELEASE_TABLET | Freq: Every day | ORAL | Status: DC
  Administered 2016-04-02: 40 mg via ORAL
  Filled 2016-04-02: qty 1

## 2016-04-02 MED ORDER — LEVOTHYROXINE SODIUM 50 MCG PO TABS
75.0000 ug | ORAL_TABLET | Freq: Every day | ORAL | Status: DC
  Administered 2016-04-02: 75 ug via ORAL
  Filled 2016-04-02: qty 2

## 2016-04-02 MED ORDER — HYDROCHLOROTHIAZIDE 12.5 MG PO CAPS
12.5000 mg | ORAL_CAPSULE | Freq: Every day | ORAL | Status: DC
  Administered 2016-04-02: 12.5 mg via ORAL
  Filled 2016-04-02: qty 1

## 2016-04-02 MED ORDER — TRAZODONE HCL 100 MG PO TABS: 100.0000 mg | ORAL_TABLET | Freq: Every evening | ORAL | Status: DC | PRN

## 2016-04-02 MED ORDER — ALBUTEROL SULFATE (2.5 MG/3ML) 0.083% IN NEBU: 2.5000 mg | INHALATION_SOLUTION | RESPIRATORY_TRACT | Status: DC | PRN

## 2016-04-02 MED ORDER — ROPINIROLE HCL 1 MG PO TABS
3.0000 mg | ORAL_TABLET | Freq: Every day | ORAL | Status: DC
  Administered 2016-04-02: 3 mg via ORAL
  Filled 2016-04-02: qty 3

## 2016-04-02 NOTE — BH Assessment (Signed)
Assessment Note  Mathew Aguilar is an 65 y.o. male who presents to the ER via [place], after being seen with [facility]. Patient called law enforcement and informed them, he was having thoughts of ending his life. He was going to do this by stabbing his self and or cutting his self with a knife. He states he had the knife in his hand, when the police showed up.   He further explains, he's been depressed for approximately 4 to 5 days. His appetite has decreased, alone with is sleep. He denies AV/H and HI.  Patient is currently a client with [facility]. He states he is complaint with his medications and doing what he "supposed to be doing", as far as treatment.  During the interview, the patient was calm, cooperative and pleasant.   Diagnosis: Depression  Past Medical History:  Past Medical History  Diagnosis Date  . Hypertension   . Depression   . Restless leg     Past Surgical History  Procedure Laterality Date  . Amputation arm      Family History:  Family History  Problem Relation Age of Onset  . Family history unknown: Yes    Social History:  reports that he has never smoked. He has never used smokeless tobacco. He reports that he does not drink alcohol or use illicit drugs.  Additional Social History:  Alcohol / Drug Use Pain Medications: See PTA Prescriptions: See PTA Over the Counter: See PTA History of alcohol / drug use?: Yes Longest period of sobriety (when/how long): unknown Negative Consequences of Use: Personal relationships, Financial Substance #1 Name of Substance 1: Alcohol 1 - Age of First Use: unknown 1 - Amount (size/oz): 12 pack 1 - Frequency: whenever 1 - Duration: varies 1 - Last Use / Amount: 02/26/2016  CIWA: CIWA-Ar BP: 123/78 mmHg Pulse Rate: (!) 51 COWS:    Allergies: No Known Allergies  Home Medications:  (Not in a hospital admission)  OB/GYN Status:  No LMP for male patient.  General Assessment Data Location of Assessment:  [facility] ED TTS Assessment: In system Is this a Tele or Face-to-Face Assessment?: Face-to-Face Is this an Initial Assessment or a Re-assessment for this encounter?: Initial Assessment Marital status: Single Maiden name: n/a Is patient pregnant?: No Pregnancy Status: No Living Arrangements: Alone Can pt return to current living arrangement?: Yes Admission Status: Involuntary Is patient capable of signing voluntary admission?: Yes Referral Source: Self/Family/Friend Insurance type: Medicare  Medical Screening Exam ([place]) Medical Exam completed: Yes  Crisis Care Plan Living Arrangements: Alone Legal Guardian: Other: (Reports none) Name of Psychiatrist: [facility] Name of Therapist: [facility]  Education Status Is patient currently in school?: No Current Grade: n/a Highest grade of school patient has completed: Unknown Name of school: n/a Contact person: n/a  Risk to self with the past 6 months Suicidal Ideation: Yes-Currently Present Has patient been a risk to self within the past 6 months prior to admission? : Yes Suicidal Intent: Yes-Currently Present Has patient had any suicidal intent within the past 6 months prior to admission? : Yes Is patient at risk for suicide?: Yes Suicidal Plan?: Yes-Currently Present Has patient had any suicidal plan within the past 6 months prior to admission? : Yes Specify Current Suicidal Plan: Stab and cut self with knife Access to Means: Yes Specify Access to Suicidal Means: Have knives What has been your use of drugs/alcohol within the last 12 months?: Alcohol Previous Attempts/Gestures: Yes How many times?: 6  Other Self Harm Risks: Active Addiction Triggers for Past Attempts: None known Intentional Self Injurious Behavior: None Family Suicide History: No Recent stressful life event(s): Conflict (Comment), Other (Comment) (Active Addiction) Persecutory voices/beliefs?: No Depression: Yes Depression Symptoms: Feeling  angry/irritable, Feeling worthless/self pity, Loss of interest in usual pleasures, Guilt, Fatigue, Isolating, Tearfulness Substance abuse history and/or treatment for substance abuse?: Yes Suicide prevention information given to non-admitted patients: Not applicable  Risk to Others within the past 6 months Homicidal Ideation: No Does patient have any lifetime risk of violence toward others beyond the six months prior to admission? : No Thoughts of Harm to Others: No Current Homicidal Intent: No Current Homicidal Plan: No Access to Homicidal Means: No Identified Victim: Reports of none History of harm to others?: No Assessment of Violence: None Noted Violent Behavior Description: Reports of none Does patient have access to weapons?: No Criminal Charges Pending?: No Does patient have a court date: No Is patient on probation?: No  Psychosis Hallucinations: None noted Delusions: None noted  Mental Status Report Appearance/Hygiene: In scrubs, Unremarkable, In hospital gown Eye Contact: Good Motor Activity: Freedom of movement, Unremarkable Speech: Logical/coherent, Unremarkable Level of Consciousness: Alert Mood: Depressed, Helpless, Sad, Pleasant Affect: Anxious, Appropriate to circumstance, Sad Anxiety Level: Minimal Thought Processes: Coherent, Relevant Judgement: Partial Orientation: Person, Place, Time, Situation, Appropriate for developmental age Obsessive Compulsive Thoughts/Behaviors: Minimal  Cognitive Functioning Concentration: Normal Memory: Recent Intact, Remote Intact IQ: Average Insight: Fair Impulse Control: Poor Appetite: Fair Weight Loss: 0 Weight Gain: 0 Sleep: No Change Total Hours of Sleep: 8 Vegetative Symptoms: None  ADLScreening [facility] Assessment Services) Patient's cognitive ability adequate to safely complete daily activities?: Yes Patient able to express need for assistance with ADLs?: Yes Independently performs ADLs?: Yes (appropriate for  developmental age)  Prior Inpatient Therapy Prior Inpatient Therapy: Yes Prior Therapy Dates: Multiple Dates Prior Therapy Facilty/Provider(s): [place] Reason for Treatment: Depression and Alcohol use  Prior Outpatient Therapy Prior Outpatient Therapy: Yes Prior Therapy Dates: Current Prior Therapy Facilty/Provider(s): [facility] Reason for Treatment: Depression Does patient have an ACCT team?: Yes (ACTT [facility]) Does patient have Intensive In-House Services?  : No Does patient have [facility] services? : No Does patient have P4CC services?: No  ADL Screening (condition at time of admission) Patient's cognitive ability adequate to safely complete daily activities?: Yes Is the patient deaf or have difficulty hearing?: No Does the patient have difficulty seeing, even when wearing glasses/contacts?: No Does the patient have difficulty concentrating, remembering, or making decisions?: No Patient able to express need for assistance with ADLs?: Yes Does the patient have difficulty dressing or bathing?: No Independently performs ADLs?: Yes (appropriate for developmental age) Does the patient have difficulty walking or climbing stairs?: No Weakness of Legs: None Weakness of Arms/Hands: None     Therapy Consults (therapy consults require a physician order) PT Evaluation Needed: No OT Evalulation Needed: No SLP Evaluation Needed: No Abuse/Neglect Assessment (Assessment to be complete while patient is alone) Physical Abuse: Denies Verbal Abuse: Denies Sexual Abuse: Denies Exploitation of patient/patient's resources: Denies Self-Neglect: Denies Values / Beliefs Cultural Requests During Hospitalization: None Spiritual Requests During Hospitalization: None Consults Spiritual Care Consult Needed: No Social Work Consult Needed: No Regulatory affairs officer (For Healthcare) Does patient have an advance directive?: No Would patient like information on creating an advanced directive?: Yes Geologist, engineering given    Additional Information 1:1 In Past 12 Months?: No CIRT Risk: No Elopement Risk: No Does patient have medical clearance?: Yes  Child/Adolescent Assessment Running Away Risk: Denies (Patient is an adult)  Disposition:  Disposition Initial Assessment Completed for this Encounter: Yes Disposition of Patient: Other dispositions (ER MD Ordered Psych Consult)  On Site Evaluation by:   Reviewed with Physician:    Gunnar Fusi MS, LCAS, [place], [place], CCSI Therapeutic Triage Specialist 04/02/2016 8:05 PM

## 2016-04-02 NOTE — ED Notes (Signed)
STates he has been feeling suicidal for about 4 days.  States he has been unable to eat and he has a plan to cut self.  Patient has attempted to harm self in the past, last attempt 1 year ago.
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
## 2016-04-03 ENCOUNTER — Inpatient Hospital Stay
Admit: 2016-04-03 | Discharge: 2016-04-06 | DRG: 885 | Disposition: A | Discharge status: Home / Self Care | Payer: 59 | Source: Intra-hospital | Attending: Psychiatry | Admitting: Psychiatry

## 2016-04-03 DIAGNOSIS — E8881 Metabolic syndrome: Secondary | ICD-10-CM | POA: Diagnosis present

## 2016-04-03 DIAGNOSIS — J449 Chronic obstructive pulmonary disease, unspecified: Secondary | ICD-10-CM | POA: Diagnosis present

## 2016-04-03 DIAGNOSIS — K219 Gastro-esophageal reflux disease without esophagitis: Secondary | ICD-10-CM | POA: Diagnosis present

## 2016-04-03 DIAGNOSIS — Z9889 Other specified postprocedural states: Secondary | ICD-10-CM

## 2016-04-03 DIAGNOSIS — G4700 Insomnia, unspecified: Secondary | ICD-10-CM | POA: Diagnosis present

## 2016-04-03 DIAGNOSIS — Z9114 Patient's other noncompliance with medication regimen: Secondary | ICD-10-CM | POA: Diagnosis not present

## 2016-04-03 DIAGNOSIS — R45851 Suicidal ideations: Secondary | ICD-10-CM | POA: Diagnosis present

## 2016-04-03 DIAGNOSIS — F1020 Alcohol dependence, uncomplicated: Secondary | ICD-10-CM | POA: Diagnosis present

## 2016-04-03 DIAGNOSIS — F333 Major depressive disorder, recurrent, severe with psychotic symptoms: Secondary | ICD-10-CM | POA: Diagnosis present

## 2016-04-03 DIAGNOSIS — I10 Essential (primary) hypertension: Secondary | ICD-10-CM | POA: Diagnosis present

## 2016-04-03 DIAGNOSIS — E039 Hypothyroidism, unspecified: Secondary | ICD-10-CM | POA: Diagnosis present

## 2016-04-03 DIAGNOSIS — G2581 Restless legs syndrome: Secondary | ICD-10-CM | POA: Diagnosis present

## 2016-04-03 DIAGNOSIS — F1010 Alcohol abuse, uncomplicated: Secondary | ICD-10-CM | POA: Diagnosis present

## 2016-04-03 DIAGNOSIS — Z79899 Other long term (current) drug therapy: Secondary | ICD-10-CM | POA: Diagnosis not present

## 2016-04-03 LAB — URINE DRUG SCREEN, QUALITATIVE (ARMC ONLY)
AMPHETAMINES, UR SCREEN: NOT DETECTED
BENZODIAZEPINE, UR SCRN: NOT DETECTED
Barbiturates, Ur Screen: NOT DETECTED
Cannabinoid 50 Ng, Ur ~~LOC~~: NOT DETECTED
Cocaine Metabolite,Ur ~~LOC~~: NOT DETECTED
MDMA (Ecstasy)Ur Screen: NOT DETECTED
Methadone Scn, Ur: NOT DETECTED
OPIATE, UR SCREEN: NOT DETECTED
PHENCYCLIDINE (PCP) UR S: NOT DETECTED
Tricyclic, Ur Screen: NOT DETECTED

## 2016-04-03 MED ORDER — METFORMIN HCL 500 MG PO TABS
500.0000 mg | ORAL_TABLET | Freq: Every day | ORAL | Status: DC
  Administered 2016-04-03 – 2016-04-06 (×4): 500 mg via ORAL
  Filled 2016-04-03 (×4): qty 1

## 2016-04-03 MED ORDER — DOXAZOSIN MESYLATE 2 MG PO TABS
2.0000 mg | ORAL_TABLET | Freq: Every day | ORAL | Status: DC
  Administered 2016-04-03 – 2016-04-06 (×4): 2 mg via ORAL
  Filled 2016-04-03 (×4): qty 1

## 2016-04-03 MED ORDER — MAGNESIUM HYDROXIDE 400 MG/5ML PO SUSP: 30.0000 mL | Freq: Every day | ORAL | Status: DC | PRN

## 2016-04-03 MED ORDER — DULOXETINE HCL 60 MG PO CPEP
120.0000 mg | ORAL_CAPSULE | Freq: Every day | ORAL | Status: DC
  Administered 2016-04-03 – 2016-04-06 (×4): 120 mg via ORAL
  Filled 2016-04-03 (×4): qty 2

## 2016-04-03 MED ORDER — ACETAMINOPHEN 325 MG PO TABS: 650.0000 mg | ORAL_TABLET | Freq: Four times a day (QID) | ORAL | Status: DC | PRN

## 2016-04-03 MED ORDER — ALUM & MAG HYDROXIDE-SIMETH 200-200-20 MG/5ML PO SUSP: 30.0000 mL | ORAL | Status: DC | PRN

## 2016-04-03 MED ORDER — DULOXETINE HCL 60 MG PO CPEP
60.0000 mg | ORAL_CAPSULE | Freq: Two times a day (BID) | ORAL | Status: DC
  Administered 2016-04-03: 60 mg via ORAL
  Filled 2016-04-03: qty 1

## 2016-04-03 MED ORDER — TRAZODONE HCL 100 MG PO TABS: 100.0000 mg | ORAL_TABLET | Freq: Every evening | ORAL | Status: DC | PRN

## 2016-04-03 MED ORDER — LAMOTRIGINE 25 MG PO TABS
25.0000 mg | ORAL_TABLET | Freq: Two times a day (BID) | ORAL | Status: DC
  Administered 2016-04-03: 25 mg via ORAL
  Filled 2016-04-03: qty 1

## 2016-04-03 MED ORDER — ROPINIROLE HCL 1 MG PO TABS
3.0000 mg | ORAL_TABLET | Freq: Every day | ORAL | Status: DC
  Administered 2016-04-03 – 2016-04-05 (×3): 3 mg via ORAL
  Filled 2016-04-03 (×4): qty 3

## 2016-04-03 MED ORDER — TEMAZEPAM 15 MG PO CAPS: 15.0000 mg | ORAL_CAPSULE | Freq: Every evening | ORAL | Status: DC | PRN

## 2016-04-03 MED ORDER — HYDROCHLOROTHIAZIDE 12.5 MG PO CAPS
12.5000 mg | ORAL_CAPSULE | Freq: Every day | ORAL | Status: DC
  Administered 2016-04-03 – 2016-04-06 (×4): 12.5 mg via ORAL
  Filled 2016-04-03 (×4): qty 1

## 2016-04-03 MED ORDER — PANTOPRAZOLE SODIUM 40 MG PO TBEC
40.0000 mg | DELAYED_RELEASE_TABLET | Freq: Every day | ORAL | Status: DC
  Administered 2016-04-03 – 2016-04-06 (×4): 40 mg via ORAL
  Filled 2016-04-03 (×4): qty 1

## 2016-04-03 MED ORDER — LAMOTRIGINE 25 MG PO TABS
50.0000 mg | ORAL_TABLET | Freq: Every day | ORAL | Status: DC
  Administered 2016-04-03 – 2016-04-06 (×4): 50 mg via ORAL
  Filled 2016-04-03 (×4): qty 2

## 2016-04-03 MED ORDER — LEVOTHYROXINE SODIUM 25 MCG PO TABS
75.0000 ug | ORAL_TABLET | Freq: Every day | ORAL | Status: DC
  Administered 2016-04-03 – 2016-04-06 (×4): 75 ug via ORAL
  Filled 2016-04-03 (×4): qty 3

## 2016-04-03 MED ORDER — ARIPIPRAZOLE 10 MG PO TABS
20.0000 mg | ORAL_TABLET | Freq: Every day | ORAL | Status: DC
  Administered 2016-04-03 – 2016-04-06 (×4): 20 mg via ORAL
  Filled 2016-04-03 (×4): qty 2

## 2016-04-03 MED ORDER — PANTOPRAZOLE SODIUM 40 MG PO TBEC
40.0000 mg | DELAYED_RELEASE_TABLET | Freq: Every day | ORAL | Status: DC
  Administered 2016-04-03: 40 mg via ORAL
  Filled 2016-04-03: qty 1

## 2016-04-03 MED ORDER — ALBUTEROL SULFATE (2.5 MG/3ML) 0.083% IN NEBU: 2.5000 mg | INHALATION_SOLUTION | RESPIRATORY_TRACT | Status: DC | PRN

## 2016-04-03 NOTE — BHH Counselor (Signed)
Adult Comprehensive Assessment  Patient ID: Mathew Aguilar, male DOB: June 13, 1951, 65 y.o. MRN: [id number]  Information Source: Information source: Patient  Current Stressors:  Family Relationships: distant family Financial / Lack of resources (include bankruptcy): Limited income Housing / Lack of housing: has appropriate housing but unhappy at not being able to meet people the way he would like. Substance abuse:  Pt reports relapsing on alcohol. He reports drinking a 6 pack over 2 weeks.  Living/Environment/Situation:  Living Arrangements: Alone Living conditions (as described by patient or guardian): nice apartment, says it has gotten messy because he did not feel like cleaning or getting out of bed for a week How long has patient lived in current situation?: many years. What is atmosphere in current home: Comfortable, Supportive (Pt seems to crave more friendship and social contacts then he is getting.)  Family History:  Marital status: Divorced Divorced, when?: 2006 Additional relationship information: has a girlfriend name [name] that lives in [place] that he sees frequently. What is your sexual orientation?: heterosexual Has your sexual activity been affected by drugs, alcohol, medication, or emotional stress?: no Does patient have children?: Yes How many children?: 1 How is patient's relationship with their children?: Son, 24  Childhood History:  By whom was/is the patient raised?: Both parents Description of patient's relationship with caregiver when they were a child: describes parents as good people Patient's description of current relationship with people who raised him/her: Parents are both deceased. How were you disciplined when you got in trouble as a child/adolescent?: whooping, he says the ones he got he needed Does patient have siblings?: Yes Number of Siblings: 3 Description of patient's current relationship with siblings: brother, two sisters, [place]   Did patient suffer any verbal/emotional/physical/sexual abuse as a child?: No Did patient suffer from severe childhood neglect?: No Has patient ever been sexually abused/assaulted/raped as an adolescent or adult?: No Was the patient ever a victim of a crime or a disaster?: No Witnessed domestic violence?: No Has patient been effected by domestic violence as an adult?: No  Education:  Highest grade of school patient has completed: 8th grade Currently a student?: No Learning disability?: Yes  Employment/Work Situation:  Employment situation: On disability Why is patient on disability: mental health How long has patient been on disability: 29 Patient's job has been impacted by current illness: Yes Describe how patient's job has been impacted: Pt unable to work after self inflicted injury to his arm, due to mental illness. 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 Prayed and asked God to help him not want to drink anymore. If attempted suicide, did drugs/alcohol play a role in this?: No Alcohol/Substance Abuse Treatment Hx: Past Tx, Outpatient Has alcohol/substance abuse ever caused legal problems?: No  Social Support System:  Describe Community Support System: [facility] Type of faith/religion: none How does patient's faith help to cope with current illness?: none  Leisure/Recreation:  Leisure and Hobbies: meeting people, talking  Strengths/Needs:   What things does the  patient do well?: help people. help neighbor who's 9 going to grocery or doctor, take her mail. In what areas does patient struggle / problems for patient: financially  Discharge Plan:  Does patient have access to transportation?: Yes Will patient be returning to same living situation after discharge?: Yes Currently receiving community mental health services: Yes (From Whom) ([facility]) If no, would patient like referral for services when discharged?: No Does patient have financial barriers related to discharge medications?: No  Summary/Recommendations:   Patient is a 65 year old male admitted  with a diagnosis of Major Depression. Patient presented to the hospital with depression and SI. Patient reports primary triggers for admission were relapse on alcohol and non compliance of medications. Patient will benefit from crisis stabilization, medication evaluation, group therapy and psycho education in addition to case management for discharge. At discharge, it is recommended that patient remain compliant with established discharge plan and continued treatment.   Emilie Rutter MSW, Latanya Presser  04/03/2016

## 2016-04-03 NOTE — Tx Team (Signed)
Interdisciplinary Treatment Plan Update (Adult)         Date: 04/03/2016   Time Reviewed: 10:30 AM   Progress in Treatment: Improving Attending groups: Yes  Participating in groups: Yes  Taking medication as prescribed: Yes  Tolerating medication: Yes  Family/Significant other contact made: CSW still accessing proper contacts  Patient understands diagnosis: Yes  Discussing patient identified problems/goals with staff: Yes  Medical problems stabilized or resolved: Yes  Denies suicidal/homicidal ideation: Yes  Issues/concerns per patient self-inventory: Yes  Other:   New problem(s) identified: N/A   Discharge Plan or Barriers: see below   Reason for Continuation of Hospitalization:   Depression   Anxiety   Medication Stabilization   Comments: N/A   Estimated length of stay: 3-5 days    Patient is a 65 year old male admitted for suicidal ideation and depressive symptoms. Patient lives in [place], [place]. Patient will benefit from crisis stabilization, medication evaluation, group therapy, and psycho education in addition to case management for discharge planning. Patient and CSW reviewed pt's identified goals and treatment plan. Pt verbalized understanding and agreed to treatment plan.    Review of initial/current patient goals per problem list:  1. Goal(s): Patient will participate in aftercare plan   Met: Goal progressing  Target date: 3-5 days post admission date   As evidenced by: Patient will participate within aftercare plan AEB aftercare provider and housing plan at discharge being identified.   CSW assessing proper contact for ACTT.  2. Goal (s): Patient will exhibit decreased depressive symptoms and suicidal ideations.   Met: No  Target date: 3-5 days post admission date   As evidenced by: Patient will utilize self-rating of depression at 3 or below and demonstrate decreased signs of depression or be deemed stable for discharge by MD.   Patient reports  a depression score of 6 at this time. Still has thoughts of suicide at this time.  3. Goal(s): Patient will demonstrate decreased signs and symptoms of anxiety.   Met: No  Target date: 3-5 days post admission date   As evidenced by: Patient will utilize self-rating of anxiety at 3 or below and demonstrated decreased signs of anxiety, or be deemed stable for discharge by MD   Reports an anxiety score of 5 at this time.  4. Goal(s): Patient will demonstrate decreased signs of withdrawal due to substance abuse   Met: Yes  Target date: 3-5 days post admission date   As evidenced by: Patient will produce a CIWA/COWS score of 0, have stable vitals signs, and no symptoms of withdrawal   Patient produced a CIWA/COWS score of 0, has stable vitals signs, and no symptoms of withdrawal     Attendees:  Patient: Mathew Aguilar Family:  Physician: Bunnie Pion , MD     04/03/2016 10:30 AM  Nursing: Floyde Parkins, RN      04/03/2016 10:30 AM  Clinical Social Worker: Emilie Rutter, [place]  04/03/2016 10:30 AM

## 2016-04-03 NOTE — BHH Group Notes (Signed)
[facility] LCSW Group Therapy  04/03/2016 2:26 PM  Type of Therapy:  Group Therapy  Participation Level:  Did Not Attend but was invited to attend group.   Summary of Progress/Problems: Feelings around Relapse. Group members discussed the meaning of relapse and shared personal stories of relapse, how it affected them and others, and how they perceived themselves during this time. Group members were encouraged to identify triggers, warning signs and coping skills used when facing the possibility of relapse. Social supports were discussed and explored in detail.     Illeana Edick G. [place], [place] 04/03/2016, 2:26 PM

## 2016-04-03 NOTE — Progress Notes (Signed)
DAR: Pt presents with sad, flat affect. Tearful at times, reports feeling lonely and sad. Reports having passive thoughts of self harm with no plan, and verbally contracts for safety. Pt refused to come to treatment team meeting, but did attend a group today. Denies HI/AVH. Encouragement and support offered. Pt receptive and remains safe on unit with q 15 min checks.
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
## 2016-04-03 NOTE — Tx Team (Signed)
Initial Interdisciplinary Treatment Plan   PATIENT STRESSORS: Substance abuse Traumatic event   PATIENT STRENGTHS: Capable of independent living Motivation for treatment/growth   PROBLEM LIST: Problem List/Patient Goals Date to be addressed Date deferred Reason deferred Estimated date of resolution  Depression 04/03/16           Suicidal attempt.  04/03/16           Substance abuse.  04/03/16                              DISCHARGE CRITERIA:  Adequate post-discharge living arrangements Improved stabilization in mood, thinking, and/or behavior Motivation to continue treatment in a less acute level of care Reduction of life-threatening or endangering symptoms to within safe limits  PRELIMINARY DISCHARGE PLAN: Attend 12-step recovery group Outpatient therapy  PATIENT/FAMIILY INVOLVEMENT: This treatment plan has been presented to and reviewed with the patient, Masood, [place] patient and family have been given the opportunity to ask questions and make suggestions.  Evanell Redlich Abisola Mazie Fencl 04/03/2016, 4:19 AM

## 2016-04-03 NOTE — BHH Suicide Risk Assessment (Signed)
[place] INPATIENT:  Family/Significant Other Suicide Prevention Education  Suicide Prevention Education:  Education Completed; sister, Sheral Apley ph#: [phone] has been identified by the patient as the family member/significant other with whom the patient will be residing, and identified as the person(s) who will aid the patient in the event of a mental health crisis (suicidal ideations/suicide attempt).  With written consent from the patient, the family member/significant other has been provided the following suicide prevention education, prior to the and/or following the discharge of the patient. Sister believes that a trigger for pt's admission is because he is not able to see his 66 year old son. Also, believes that pt's current relationship has added more stress in pt's life. She believes that when something unforseen happens, pt becomes unable to manage and does not have proper coping mechanisms.  The suicide prevention education provided includes the following:  Suicide risk factors  Suicide prevention and interventions  National Suicide Hotline telephone number  [facility] assessment telephone number  [facility] Emergency Assistance [place] and/or Residential Mobile Crisis Unit telephone number  Request made of family/significant other to:  Remove weapons (e.g., guns, rifles, knives), all items previously/currently identified as safety concern.    Remove drugs/medications (over-the-counter, prescriptions, illicit drugs), all items previously/currently identified as a safety concern.  The family member/significant other verbalizes understanding of the suicide prevention education information provided.  The family member/significant other agrees to remove the items of safety concern listed above.  Emilie Rutter, MSW, LCSW-A 04/03/2016, 12:06 PM

## 2016-04-03 NOTE — BHH Suicide Risk Assessment (Signed)
[facility] Admission Suicide Risk Assessment   Nursing information obtained from:    Demographic factors:    Current Mental Status:    Loss Factors:    Historical Factors:    Risk Reduction Factors:     Total Time spent with patient: 1 hour Principal Problem: Severe recurrent major depression with psychotic features [facility]) Diagnosis:   Patient Active Problem List   Diagnosis Date Noted  . COPD (chronic obstructive pulmonary disease) ([place]) [J44.9] 04/03/2016  . Alcohol use disorder, mild, abuse [F10.10] 02/27/2016  . Severe recurrent major depression with psychotic features ([place]) [F33.3] 02/27/2016  . Suicidal ideation [R45.851] 02/03/2016  . GERD (gastroesophageal reflux disease) [K21.9] 02/19/2015  . Essential hypertension [I10] 02/18/2015  . Restless legs syndrome [G25.81] 02/18/2015   Subjective Data:   Continued Clinical Symptoms:  Alcohol Use Disorder Identification Test Final Score (AUDIT): 2 The "Alcohol Use Disorders Identification Test", Guidelines for Use in Primary Care, Second Edition.  World Pharmacologist [facility]). Score between 0-7:  no or low risk or alcohol related problems. Score between 8-15:  moderate risk of alcohol related problems. Score between 16-19:  high risk of alcohol related problems. Score 20 or above:  warrants further diagnostic evaluation for alcohol dependence and treatment.   CLINICAL FACTORS:   Depression:   Comorbid alcohol abuse/dependence Impulsivity Alcohol/Substance Abuse/Dependencies Previous Psychiatric Diagnoses and Treatments    Psychiatric Specialty Exam: Physical Exam  ROS  Blood pressure 99/52, pulse 61, temperature 98 F (36.7 C), temperature source Oral, resp. rate 18, height 5\' 2"  (1.575 m), weight 68.04 kg (150 lb), SpO2 97 %.Body mass index is 27.43 kg/(m^2).                                                    Sleep:  Number of Hours: 1.15      COGNITIVE FEATURES THAT CONTRIBUTE TO RISK:   Closed-mindedness    SUICIDE RISK:   Moderate:  Frequent suicidal ideation with limited intensity, and duration, some specificity in terms of plans, no associated intent, good self-control, limited dysphoria/symptomatology, some risk factors present, and identifiable protective factors, including available and accessible social support.  PLAN OF CARE: admit to [facility]  I certify that inpatient services furnished can reasonably be expected to improve the patient's condition.   [name], MD 04/03/2016, 12:45 PM

## 2016-04-03 NOTE — Plan of Care (Signed)
Problem: Coping: Goal: Ability to verbalize feelings will improve Outcome: Progressing Patient verbalizing feelings.

## 2016-04-03 NOTE — Social Work (Signed)
CSW contacted [facility] to notify that patient is on BMU. [facility] aware, will pick pt up at discharge.  Emilie Rutter, MSW, LCSW-A 04/03/16   2:41PM

## 2016-04-03 NOTE — BHH Suicide Risk Assessment (Deleted)
[place] INPATIENT:  Family/Significant Other Suicide Prevention Education  Suicide Prevention Education:  Patient Refusal for Family/Significant Other Suicide Prevention Education: The patient Mathew Aguilar has refused to provide written consent for family/significant other to be provided Family/Significant Other Suicide Prevention Education during admission and/or prior to discharge.  Physician notified.  Emilie Rutter, MSW, LCSW-A 04/03/2016, 11:55 AM

## 2016-04-04 LAB — GLUCOSE, CAPILLARY: GLUCOSE-CAPILLARY: 81 mg/dL (ref 65–99)

## 2016-04-04 MED ORDER — TEMAZEPAM 15 MG PO CAPS
15.0000 mg | ORAL_CAPSULE | Freq: Every day | ORAL | Status: DC
  Administered 2016-04-04 – 2016-04-05 (×2): 15 mg via ORAL
  Filled 2016-04-04 (×2): qty 1

## 2016-04-04 NOTE — Plan of Care (Signed)
Problem: Coping: Goal: Ability to verbalize frustrations and anger appropriately will improve Outcome: Progressing Pt verbalizes frustration over lack of sleep and a desire to be discharged.  Problem: Medication: Goal: Compliance with prescribed medication regimen will improve Outcome: Progressing Pt taking medications as prescribed.

## 2016-04-04 NOTE — Progress Notes (Signed)
[facility] MD Progress Note  04/04/2016 11:23 AM Mathew Aguilar  MRN:  [id number] Subjective:  Mathew Aguilar is a 65 year old single Caucasian male from [place] who carries a diagnosis of recurrent major depressive disorder with psychotic features and alcohol dependence. He is followed up by [facility]. He lives in an apartment by himself.  This patient was just discharged June 19. He was admitted due to depression secondary to noncompliance with medications and relapsed on alcohol.  During my interview patient reported that he continues to feel depressed hopeless helpless as his son has moved to [place] although he was living with him. He reported that he has been depressed for about a week. He has poor sleep and he has been compliant with his medications. Currently he denied having any suicidal ideations or plans. He reported to the emergency room physician that he came to the hospital as he was having constant negative thinking and suicidal thoughts with active wish to die. He was also not taking his medications regularly at that time.  Patient is currently prescribed with Abilify, lamictal, Cymbalta and naltrexone.  Substance abuse history: Long history of alcohol abuse. Recently he had been staying sober for extended periods of time although he had a relapse I believe on his last presentation. He says  readmitted due to worsening depression and stated that his son to [name]. He lives close by. He stated that he has been feeling depressed hopeless and came to the hospital to get help.  was calm, pleasant and cooperative.Marland Kitchen He denies side effects from medications. Denies any physical complaints. Denies suicidality, homicidality or having auditory visual hallucinations  He follows [facility] team. He is getting along with them. Lives by himself.    Principal Problem: Severe recurrent major depression with psychotic features [facility]) Diagnosis:   Patient Active Problem List   Diagnosis Date Noted  . COPD (chronic obstructive pulmonary disease) ([place]) [J44.9] 04/03/2016  . Alcohol use disorder, mild, abuse [F10.10] 02/27/2016  . Severe recurrent major depression with psychotic features ([place]) [F33.3] 02/27/2016  . Suicidal ideation [R45.851] 02/03/2016  . GERD (gastroesophageal reflux disease) [K21.9] 02/19/2015  . Essential hypertension [I10] 02/18/2015  . Restless legs syndrome [G25.81] 02/18/2015   Total Time spent with patient: 30 minutes  Past Psychiatric History: He has long history of mental illness. He has history of recurrent admissions. He was just discharged from the inpatient behavioral health unit and follows with [facility].  Past Medical History:  Past Medical History  Diagnosis Date  . Hypertension   . Depression   . Restless leg     Past Surgical History  Procedure Laterality Date  . Amputation arm     Family History:  Family History  Problem Relation Age of Onset  . Family history unknown: Yes   Family Psychiatric  History: see previous admission  Social History:  History  Alcohol Use No    Comment: history of ETOH abuse - sober several months     History  Drug Use No    Social History   Social History  . Marital Status: Single    Spouse Name: N/A  . Number of Children: N/A  . Years of Education: N/A   Social History Main Topics  . Smoking status: Never Smoker   . Smokeless tobacco: Never Used  . Alcohol Use: No     Comment: history of ETOH abuse - sober several months  . Drug Use: No  . Sexual Activity: Not Currently  Birth Control/ Protection: None   Other Topics Concern  . None   Social History Narrative     Current Medications: Current Facility-Administered Medications  Medication Dose Route Frequency Provider Last Rate Last Dose  . acetaminophen (TYLENOL) tablet 650 mg  650 mg Oral Q6H PRN Gonzella Lex, MD      . albuterol (PROVENTIL) (2.5 MG/3ML) 0.083% nebulizer solution 2.5 mg  2.5 mg Inhalation Q4H PRN  Gonzella Lex, MD      . alum & mag hydroxide-simeth (MAALOX/MYLANTA) 200-200-20 MG/5ML suspension 30 mL  30 mL Oral Q4H PRN Gonzella Lex, MD      . ARIPiprazole (ABILIFY) tablet 20 mg  20 mg Oral Q breakfast [name], MD   20 mg at 04/04/16 0836  . doxazosin (CARDURA) tablet 2 mg  2 mg Oral Daily Gonzella Lex, MD   2 mg at 04/04/16 0835  . DULoxetine (CYMBALTA) DR capsule 120 mg  120 mg Oral Q breakfast [name], MD   120 mg at 04/04/16 0834  . hydrochlorothiazide (MICROZIDE) capsule 12.5 mg  12.5 mg Oral Daily Gonzella Lex, MD   12.5 mg at 04/04/16 0835  . lamoTRIgine (LAMICTAL) tablet 50 mg  50 mg Oral Q breakfast [name], MD   50 mg at 04/04/16 0835  . levothyroxine (SYNTHROID, LEVOTHROID) tablet 75 mcg  75 mcg Oral QAC breakfast Gonzella Lex, MD   75 mcg at 04/04/16 0636  . magnesium hydroxide (MILK OF MAGNESIA) suspension 30 mL  30 mL Oral Daily PRN Gonzella Lex, MD      . metFORMIN (GLUCOPHAGE) tablet 500 mg  500 mg Oral Q breakfast Gonzella Lex, MD   500 mg at 04/04/16 0834  . pantoprazole (PROTONIX) EC tablet 40 mg  40 mg Oral Q breakfast [name], MD   40 mg at 04/04/16 0834  . rOPINIRole (REQUIP) tablet 3 mg  3 mg Oral QHS Gonzella Lex, MD   3 mg at 04/03/16 2135    Lab Results:  Results for orders placed or performed during the hospital encounter of 04/03/16 (from the past 48 hour(s))  Urine Drug Screen, Qualitative     Status: None   Collection Time: 04/03/16 10:18 AM  Result Value Ref Range   Tricyclic, Ur Screen NONE DETECTED NONE DETECTED   Amphetamines, Ur Screen NONE DETECTED NONE DETECTED   MDMA (Ecstasy)Ur Screen NONE DETECTED NONE DETECTED   Cocaine Metabolite,Ur [place] NONE DETECTED NONE DETECTED   Opiate, Ur Screen NONE DETECTED NONE DETECTED   Phencyclidine (PCP) Ur S NONE DETECTED NONE DETECTED   Cannabinoid 50 Ng, Ur [place] NONE DETECTED NONE DETECTED   Barbiturates, Ur Screen NONE DETECTED  NONE DETECTED   Benzodiazepine, Ur Scrn NONE DETECTED NONE DETECTED   Methadone Scn, Ur NONE DETECTED NONE DETECTED    Comment: (NOTE) [id number]  Tricyclics, urine               Cutoff 1000 ng/mL 200  Amphetamines, urine             Cutoff 1000 ng/mL 300  MDMA (Ecstasy), urine           Cutoff 500 ng/mL 400  Cocaine Metabolite, urine       Cutoff 300 ng/mL 500  Opiate, urine                   Cutoff 300 ng/mL 600  Phencyclidine (PCP), urine      Cutoff 25 ng/mL  700  Cannabinoid, urine              Cutoff 50 ng/mL 800  Barbiturates, urine             Cutoff 200 ng/mL 900  Benzodiazepine, urine           Cutoff 200 ng/mL 1000 Methadone, urine                Cutoff 300 ng/mL 1100 1200 The urine drug screen provides only a preliminary, unconfirmed 1300 analytical test result and should not be used for non-medical 1400 purposes. Clinical consideration and professional judgment should 1500 be applied to any positive drug screen result due to possible 1600 interfering substances. A more specific alternate chemical method 1700 must be used in order to obtain a confirmed analytical result.  1800 Gas chromato graphy / mass spectrometry (GC/MS) is the preferred 1900 confirmatory method.     Blood Alcohol level:  Lab Results  Component Value Date   ETH <5 04/02/2016   ETH 53* 02/26/2016    Physical Findings: AIMS: Facial and Oral Movements Muscles of Facial Expression: None, normal Lips and Perioral Area: None, normal Jaw: None, normal Tongue: None, normal,Extremity Movements Upper (arms, wrists, hands, fingers): None, normal Lower (legs, knees, ankles, toes): None, normal, Trunk Movements Neck, shoulders, hips: None, normal, Overall Severity Severity of abnormal movements (highest score from questions above): None, normal Incapacitation due to abnormal movements: None, normal Patient's awareness of abnormal movements (rate only patient's report): No Awareness, Dental Status Current  problems with teeth and/or dentures?: Yes Does patient usually wear dentures?: No  CIWA:  CIWA-Ar Total: 0 COWS:     Musculoskeletal: Strength & Muscle Tone: within normal limits Gait & Station: normal Patient leans: N/A  Psychiatric Specialty Exam: Physical Exam  Constitutional: He is oriented to person, place, and time. He appears well-developed and well-nourished.  Respiratory: Effort normal.  Neurological: He is alert and oriented to person, place, and time.    Review of Systems  Constitutional: Negative.   HENT: Negative.   Eyes: Negative.   Respiratory: Negative.   Cardiovascular: Negative.   Gastrointestinal: Negative.   Genitourinary: Negative.   Musculoskeletal: Negative.   Skin: Negative.   Neurological: Negative.   Psychiatric/Behavioral: Positive for depression. Negative for suicidal ideas and substance abuse. The patient is nervous/anxious and has insomnia.     Blood pressure 119/71, pulse 56, temperature 98.2 F (36.8 C), temperature source Oral, resp. rate 18, height 5\' 2"  (1.575 m), weight 150 lb (68.04 kg), SpO2 97 %.Body mass index is 27.43 kg/(m^2).  General Appearance: Fairly Groomed  Eye Contact:  Good  Speech:  Clear and Coherent  Volume:  Normal  Mood:  Anxious and Depressed  Affect:  Congruent  Thought Process:  Linear and Descriptions of Associations: Intact  Orientation:  Full (Time, Place, and Person)  Thought Content:  Hallucinations: None  Suicidal Thoughts:  No  Homicidal Thoughts:  No  Memory:  Immediate;   Fair Recent;   Fair Remote;   Fair  Judgement:  Fair  Insight:  Fair  Psychomotor Activity:  Normal  Concentration:  Concentration: Fair and Attention Span: Fair  Recall:  [employer] of Knowledge:  Fair  Language:  Fair  Akathisia:  No  Handed:    AIMS (if indicated):     Assets:  Armed forces logistics/support/administrative officer Social Support  ADL's:  Intact  Cognition:  WNL  Sleep:  Number of Hours: 3.25     Treatment Plan  Summary:  Mr.  Barsch is a 65 year old male with a history of depression admitted for suicidal ideation in the context of medication noncompliance and relapse on alcohol.  1. Suicidal ideation. The patient is able to contract for safety in the hospital..  2. Mood. We continued Effexor, Lamictal and Abilify for depression and psychosis.  3. Hypertension. We continued atenolol, Cordura and hydrochlorothiazide.   4. COPD. We continued inhalers.  5. GERD. We continued Protonix.  6. Restless leg syndrome. We continued Requip.  7. Insomnia. We offered Trazodone.   8. Hypothyroidism. We'll continue Synthroid.   9. Diabetes. He is on metformin.   10. Alcohol abuse. We will monitor for symptoms of withdrawal. The patient would benefit from [facility] IOP program participation.   11. Metabolic syndromes screening. Lipid profile and TSH are normal, hemoglobin A1c and prolactin are pending.  12. Disposition. The patient was discharged to home. He will follow up with ACT team   Improving. Stable doing well. No changes will be made to his regimen. Patient had no issues or complaints today. He is feeling well.  Rainey Pines, MD 04/04/2016, 11:23 AM

## 2016-04-04 NOTE — Progress Notes (Signed)
Pt has been pleasant and cooperative. Pt denies SI and A/V hallucinations. Pt has  attended all unit activities. Pt has been out and about on the unit. Limited verbal interactions noted with peers and staff. Pt's  mood and affect has been depressed. Will continue to observe and maintain a safe environment.

## 2016-04-04 NOTE — BHH Group Notes (Signed)
[place] LCSW Group Therapy  04/04/2016 3:33 PM  Type of Therapy:  Group Therapy  Participation Level:  Minimal  Participation Quality:  Attentive  Affect:  Appropriate  Cognitive:  Alert  Insight:  Limited  Engagement in Therapy:  Limited  Modes of Intervention:  Discussion, Education, Socialization and Support  Summary of Progress/Problems: Feelings around Relapse. Group members discussed the meaning of relapse and shared personal stories of relapse, how it affected them and others, and how they perceived themselves during this time. Group members were encouraged to identify triggers, warning signs and coping skills used when facing the possibility of relapse. Social supports were discussed and explored in detail. Pt attended group and stayed the entire time. Pt sat quietly and listened to other group members share.   [place] MSW, [place]  04/04/2016, 3:33 PM

## 2016-04-04 NOTE — Progress Notes (Signed)
D: Pt affect is sad this evening. He denies SI/HI/AVH at this time. Denies pain.  A: Emotional support and encouragement provided. Medications administered with education. q15 minute safety checks maintained. R: Pt remains free from harm. Will continue to monitor.

## 2016-04-04 NOTE — Plan of Care (Signed)
Problem: Activity: Goal: Sleeping patterns will improve Outcome: Not Progressing Pt c/o inability to sleep. Pt is seen awake in bed at this time.  Problem: Safety: Goal: Periods of time without injury will increase Outcome: Progressing Pt remains free from harm.

## 2016-04-04 NOTE — Progress Notes (Signed)
Pt c/o inability to sleep. MD on call paged. Pt informed that writer is waiting to hear from MD. Pt verbalizes understanding.

## 2016-04-05 NOTE — BHH Group Notes (Signed)
[place] LCSW Group Therapy  04/05/2016 3:36 PM  Type of Therapy:  Group Therapy  Participation Level:  Pt did not attend group. CSW invited pt to group.   Summary of Progress/Problems: Patients defined and discussed healthy coping skills. Patients identified healthy coping skills they would like to try during hospitalization and after discharge. CSW offered insight to varying coping skills that may have been new to patients such as practicing mindfulness.   Fareeha Evon G. [place], [place] 04/05/2016, 3:36 PM

## 2016-04-05 NOTE — BHH Group Notes (Signed)
[place] Group Notes:  (Nursing/MHT/Case Management/Adjunct)  Date:  04/05/2016  Time:  11:23 PM  Type of Therapy:  Group Therapy  Participation Level:  Active  Participation Quality:  Appropriate  Affect:  Appropriate  Cognitive:  Appropriate  Insight:  Appropriate  Engagement in Group:  Engaged  Modes of Intervention:  n/a  Summary of Progress/Problems:  Marylynn Pearson 04/05/2016, 11:23 PM

## 2016-04-05 NOTE — Progress Notes (Signed)
D: Pt affect is irritable this evening. During medication pass, pt becomes upset due to difficulty sleeping. He states "I'm going home tonight! There are doctors here and I'm leaving." Pt is seen in the milieu maintaining minimal contact with peers. Denies SI/HI/AVH at this time. A: Emotional support and encouragement provided. Medications administered with education. q15 minute safety checks maintained. R: Pt remains free from harm. Will continue to monitor.

## 2016-04-05 NOTE — Progress Notes (Signed)
Pt has been pleasant and cooperative. Pt denies SI and A/V hallucinations. Pt has  attended all unit activities. Pt has been out and about on the unit. Pt interacting more on the unit with both peers. and staff. Pt's  mood and affect has been depressed. Will continue to observe and maintain a safe environment.

## 2016-04-05 NOTE — Progress Notes (Signed)
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 He is followed up by [facility] team. He lives in an apartment by himself.  He was admitted due to depression secondary to noncompliance with medications and relapsed on alcohol.  During my interview patient reported that he is feeling better today and has been able to sleep well with the help of Restoril. He reported that he feels better in the morning when he has a good night sleep. He currently denied feeling depressed. He stated that the current combination of medication is helping him and he has been taking Abilify and Cymbalta in the morning. He denied having any side effects of the medication. He appeared more calm and relaxed this morning. He denied having any thoughts to hurt himself. He had been attending groups on a regular basis.     Substance abuse history: Long history of alcohol abuse. Recently he had been staying sober for extended periods of time although he had a relapse I believe on his last presentation. He says  readmitted due to worsening depression and stated that his son to Mathew Aguilar. He lives close by. He stated that he has been feeling depressed hopeless and came to the hospital to get help.  was calm, pleasant and cooperative.Marland Kitchen He denies side effects from medications. Denies any physical complaints. Denies suicidality, homicidality or having auditory visual hallucinations  He follows [facility] ACT[facility] team. He is getting along with them. Lives by himself.    Principal Problem: Severe recurrent major depression with psychotic features [facility]) Diagnosis:   Patient Active Problem List   Diagnosis Date Noted  . COPD (chronic obstructive pulmonary disease) ([place]) [J44.9] 04/03/2016  . Alcohol use disorder, mild,  abuse [F10.10] 02/27/2016  . Severe recurrent major depression with psychotic features ([place]) [F33.3] 02/27/2016  . Suicidal ideation [R45.851] 02/03/2016  . GERD (gastroesophageal reflux disease) [K21.9] 02/19/2015  . Essential hypertension [I10] 02/18/2015  . Restless legs syndrome [G25.81] 02/18/2015   Total Time spent with patient: 30 minutes  Past Psychiatric History: He has long history of mental illness. He has history of recurrent admissions. He was just discharged from the inpatient behavioral health unit and follows with [facility].  Past Medical History:  Past Medical History:  Diagnosis Date  . Depression   . Hypertension   . Restless leg     Past Surgical History:  Procedure Laterality Date  . AMPUTATION ARM     Family History:  Family History  Problem Relation Age of Onset  . Family history unknown: Yes   Family Psychiatric  History: see previous admission  Social History:  History  Alcohol Use No    Comment: history of ETOH abuse - sober several months     History  Drug Use No    Social History   Social History  . Marital status: Single    Spouse name: N/A  . Number of children: N/A  . Years of education: N/A   Social History Main Topics  . Smoking status: Never Smoker  . Smokeless tobacco: Never Used  . Alcohol use No     Comment: history of ETOH abuse - sober several months  . Drug use: No  . Sexual activity: Not Currently    Birth control/ protection: None   Other Topics Concern  .  None   Social History Narrative  . None     Current Medications: Current Facility-Administered Medications  Medication Dose Route Frequency Provider Last Rate Last Dose  . acetaminophen (TYLENOL) tablet 650 mg  650 mg Oral Q6H PRN Gonzella Lex, MD      . albuterol (PROVENTIL) (2.5 MG/3ML) 0.083% nebulizer solution 2.5 mg  2.5 mg Inhalation Q4H PRN Gonzella Lex, MD      . alum & mag hydroxide-simeth (MAALOX/MYLANTA) 200-200-20 MG/5ML suspension 30 mL  30 mL  Oral Q4H PRN Gonzella Lex, MD      . ARIPiprazole (ABILIFY) tablet 20 mg  20 mg Oral Q breakfast [name], MD   20 mg at 04/05/16 0813  . doxazosin (CARDURA) tablet 2 mg  2 mg Oral Daily Gonzella Lex, MD   2 mg at 04/05/16 0814  . DULoxetine (CYMBALTA) DR capsule 120 mg  120 mg Oral Q breakfast [name], MD   120 mg at 04/05/16 0814  . hydrochlorothiazide (MICROZIDE) capsule 12.5 mg  12.5 mg Oral Daily Gonzella Lex, MD   12.5 mg at 04/05/16 0815  . lamoTRIgine (LAMICTAL) tablet 50 mg  50 mg Oral Q breakfast [name], MD   50 mg at 04/05/16 0813  . levothyroxine (SYNTHROID, LEVOTHROID) tablet 75 mcg  75 mcg Oral QAC breakfast Gonzella Lex, MD   75 mcg at 04/05/16 [phone]  . magnesium hydroxide (MILK OF MAGNESIA) suspension 30 mL  30 mL Oral Daily PRN Gonzella Lex, MD      . metFORMIN (GLUCOPHAGE) tablet 500 mg  500 mg Oral Q breakfast Gonzella Lex, MD   500 mg at 04/05/16 0814  . pantoprazole (PROTONIX) EC tablet 40 mg  40 mg Oral Q breakfast [name], MD   40 mg at 04/05/16 0814  . rOPINIRole (REQUIP) tablet 3 mg  3 mg Oral QHS Gonzella Lex, MD   3 mg at 04/04/16 2134  . temazepam (RESTORIL) capsule 15 mg  15 mg Oral QHS Rainey Pines, MD   15 mg at 04/04/16 2134    Lab Results:  Results for orders placed or performed during the hospital encounter of 04/03/16 (from the past 48 hour(s))  Glucose, capillary     Status: None   Collection Time: 04/04/16  4:28 PM  Result Value Ref Range   Glucose-Capillary 81 65 - 99 mg/dL    Blood Alcohol level:  Lab Results  Component Value Date   ETH <5 04/02/2016   ETH 53 (H) 02/26/2016    Physical Findings: AIMS: Facial and Oral Movements Muscles of Facial Expression: None, normal Lips and Perioral Area: None, normal Jaw: None, normal Tongue: None, normal,Extremity Movements Upper (arms, wrists, hands, fingers): None, normal Lower (legs, knees, ankles, toes): None,  normal, Trunk Movements Neck, shoulders, hips: None, normal, Overall Severity Severity of abnormal movements (highest score from questions above): None, normal Incapacitation due to abnormal movements: None, normal Patient's awareness of abnormal movements (rate only patient's report): No Awareness, Dental Status Current problems with teeth and/or dentures?: Yes Does patient usually wear dentures?: No  CIWA:  CIWA-Ar Total: 0 COWS:     Musculoskeletal: Strength & Muscle Tone: within normal limits Gait & Station: normal Patient leans: N/A  Psychiatric Specialty Exam: Physical Exam  Constitutional: He is oriented to person, place, and time. He appears well-developed and well-nourished.  Respiratory: Effort normal.  Neurological: He is alert and oriented to person, place, and time.    Review  of Systems  Constitutional: Negative.   HENT: Negative.   Eyes: Negative.   Respiratory: Negative.   Cardiovascular: Negative.   Gastrointestinal: Negative.   Genitourinary: Negative.   Musculoskeletal: Negative.   Skin: Negative.   Neurological: Negative.   Psychiatric/Behavioral: Positive for depression. Negative for substance abuse and suicidal ideas. The patient is nervous/anxious and has insomnia.     Blood pressure (!) 116/57, pulse (!) 56, temperature 98.1 F (36.7 C), temperature source Oral, resp. rate 18, height 5\' 2"  (1.575 m), weight 150 lb (68 kg), SpO2 97 %.Body mass index is 27.44 kg/m.  General Appearance: Fairly Groomed  Eye Contact:  Good  Speech:  Clear and Coherent  Volume:  Normal  Mood:  Anxious and Depressed  Affect:  Congruent  Thought Process:  Linear and Descriptions of Associations: Intact  Orientation:  Full (Time, Place, and Person)  Thought Content:  Hallucinations: None  Suicidal Thoughts:  No  Homicidal Thoughts:  No  Memory:  Immediate;   Fair Recent;   Fair Remote;   Fair  Judgement:  Fair  Insight:  Fair  Psychomotor Activity:  Normal   Concentration:  Concentration: Fair and Attention Span: Fair  Recall:  [employer] of Knowledge:  Fair  Language:  Fair  Akathisia:  No  Handed:    AIMS (if indicated):     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 Carlton is a 65 year old male with a history of depression admitted for suicidal ideation in the context of medication noncompliance and relapse on alcohol.  1. Suicidal ideation. The patient is able to contract for safety in the hospital..  2. Mood. We continued Cymbalta  Lamictal and Abilify for depression and psychosis.  3. Hypertension. We continued atenolol, Cordura and hydrochlorothiazide.   4. COPD. We continued inhalers.  5. GERD. We continued Protonix.  6. Restless leg syndrome. We continued Requip.  7. Insomnia. We offered Trazodone.   8. Hypothyroidism. We'll continue Synthroid.   9. Diabetes. He is on metformin.   10. Alcohol abuse. We will monitor for symptoms of withdrawal. The patient would benefit from [facility] program participation.   11. Metabolic syndromes screening. Lipid profile and TSH are normal, hemoglobin A1c and prolactin are pending.  12. Disposition. The patient was discharged to home. He will follow up with ACT team   Improving. Stable doing well. No changes will be made to his regimen. Patient had no issues or complaints today. He is feeling well.  Rainey Pines, MD 04/05/2016, 3:18 PM

## 2016-04-06 MED ORDER — DULOXETINE HCL 60 MG PO CPEP: 120.0000 mg | ORAL_CAPSULE | Freq: Every day | ORAL | 0 refills | Status: DC

## 2016-04-06 MED ORDER — LEVOTHYROXINE SODIUM 75 MCG PO TABS: 75.0000 ug | ORAL_TABLET | Freq: Every day | ORAL | 0 refills | Status: DC

## 2016-04-06 MED ORDER — PANTOPRAZOLE SODIUM 40 MG PO TBEC: 40.0000 mg | DELAYED_RELEASE_TABLET | Freq: Every day | ORAL | 0 refills | Status: DC

## 2016-04-06 MED ORDER — METFORMIN HCL 500 MG PO TABS: 500.0000 mg | ORAL_TABLET | Freq: Every day | ORAL | 0 refills | Status: DC

## 2016-04-06 MED ORDER — TRAZODONE HCL 100 MG PO TABS: 100.0000 mg | ORAL_TABLET | Freq: Every evening | ORAL | 0 refills | Status: DC | PRN

## 2016-04-06 MED ORDER — ARIPIPRAZOLE 20 MG PO TABS: 20.0000 mg | ORAL_TABLET | Freq: Every day | ORAL | 0 refills | Status: DC

## 2016-04-06 MED ORDER — VENLAFAXINE HCL ER 150 MG PO CP24: 150.0000 mg | ORAL_CAPSULE | Freq: Every day | ORAL | 0 refills | Status: DC

## 2016-04-06 MED ORDER — ROPINIROLE HCL 3 MG PO TABS: 3.0000 mg | ORAL_TABLET | Freq: Every day | ORAL | 0 refills | Status: DC

## 2016-04-06 MED ORDER — HYDROCHLOROTHIAZIDE 12.5 MG PO CAPS: 12.5000 mg | ORAL_CAPSULE | Freq: Every day | ORAL | 0 refills | Status: DC

## 2016-04-06 NOTE — Progress Notes (Signed)
D: Pt is pleasant and cooperative this evening. He verbalizes a desire to be discharged. Pt rates depression and anxiety 0/10 at this time. When asked about coping skills, pt states "I won't feel that way." A: Emotional support and encouragement provided. Medications administered with education. Pt educated on the importance of medication compliance and abstaining from alcohol. q15 minute safety checks maintained. R: Pt remains free from harm. Will continue to monitor.

## 2016-04-06 NOTE — Progress Notes (Signed)
Patient discharged home. DC instructions provided and explained. Medications reviewed. Rx given. All questions answered. Denies SI, HI, AVH. Belongings returned. Pt stable at discharge. 

## 2016-04-06 NOTE — Plan of Care (Signed)
Problem: Activity: Goal: Sleeping patterns will improve Outcome: Progressing Pt reports improved sleep during the previous night.  Problem: Education: Goal: Emotional status will improve Outcome: Progressing Pt rates depression 0/10 at this time and reports improved mood.

## 2016-04-06 NOTE — [facility] Group Notes (Signed)
[employer] LCSW Group Therapy   04/06/2016 9:30 am Type of Therapy: Group Therapy   Participation Level: Active   Participation Quality: Attentive, Sharing and Supportive   Affect: Depressed and Flat   Cognitive: Alert and Oriented   Insight: Developing/Improving and Engaged   Engagement in Therapy: Developing/Improving and Engaged   Modes of Intervention: Clarification, Confrontation, Discussion, Education, Exploration,  Limit-setting, Orientation, Problem-solving, Rapport Building, Art therapist, Socialization and Support   Summary of Progress/Problems: Pt identified obstacles faced currently and processed barriers involved in overcoming these obstacles. Pt identified steps necessary for overcoming these obstacles and explored motivation (internal and external) for facing these difficulties head on. Pt further identified one area of concern in their lives and chose a goal to focus on for today. Pt identified a primary "trigger" or obstacle is not being close to family. Pt stated that he does get bored and feels isolated which causes him to feel depressed and end up in the hospital. Pt believes spending more time with family and his ACTT will help improve depressive symptoms.  Glorious Peach, LCSWA
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
## 2016-04-06 NOTE — [facility] Suicide Risk Assessment (Signed)
[facility] Discharge Suicide Risk Assessment   Principal Problem: Severe recurrent major depression with psychotic features [facility]) Discharge Diagnoses:  Patient Active Problem List   Diagnosis Date Noted  . COPD (chronic obstructive pulmonary disease) ([place]) [J44.9] 04/03/2016  . Alcohol use disorder, mild, abuse [F10.10] 02/27/2016  . Severe recurrent major depression with psychotic features ([place]) [F33.3] 02/27/2016  . Suicidal ideation [R45.851] 02/03/2016  . GERD (gastroesophageal reflux disease) [K21.9] 02/19/2015  . Essential hypertension [I10] 02/18/2015  . Restless legs syndrome [G25.81] 02/18/2015      Psychiatric Specialty Exam: ROS  Blood pressure 107/67, pulse 68, temperature 97.7 F (36.5 C), temperature source Oral, resp. rate 18, height 5\' 2"  (1.575 m), weight 68 kg (150 lb), SpO2 97 %.Body mass index is 27.44 kg/m.                                                       Mental Status Per Nursing Assessment::   On Admission:     Demographic Factors:  Male, Caucasian and Living alone  Loss Factors: NA  Historical Factors: Prior suicide attempts and Impulsivity  Risk Reduction Factors:   Positive social support  Continued Clinical Symptoms:  Alcohol/Substance Abuse/Dependencies Previous Psychiatric Diagnoses and Treatments  Cognitive Features That Contribute To Risk:  Closed-mindedness    Suicide Risk:  Minimal: No identifiable suicidal ideation.  Patients presenting with no risk factors but with morbid ruminations; may be classified as minimal risk based on the severity of the depressive symptoms    [name], MD 04/06/2016, 10:53 AM

## 2016-04-06 NOTE — Progress Notes (Signed)
  [facility] Adult Case Management Discharge Plan :  Will you be returning to the same living situation after discharge:  Yes,  home alone. At discharge, do you have transportation home?: Yes,  [facility]. Do you have the ability to pay for your medications: Yes,  Medicare coverage - [facility]  Release of information consent forms completed and in the chart;  Patient's signature needed at discharge.  Patient to Follow up at: Follow-up Information    [facility]. Go on 04/07/2016.   Why:  Your ACT team will see you tomorrow at your home for your hospital follow-up appointment. Please have your discharge paperwork for your hospital follow-up appointment. Contact your ACT team if a sudden need arise or if you have questions or concerns. Contact information: [address] [place], [place] [address] Phone: [phone] Fax: [phone]          Next level of care provider has access to [place] and Suicide Prevention discussed: Yes,  SPE reviewed with patient and sister  Have you used any form of tobacco in the last 30 days? (Cigarettes, Smokeless Tobacco, Cigars, and/or Pipes): No  Has patient been referred to the Quitline?: N/A patient is not a smoker  Patient has been referred for addiction treatment: Yes - working on substance abuse treatment with [facility] [place].   Emilie Rutter, MSW, LCSW-A 04/06/2016, 3:58 PM

## 2016-06-27 ENCOUNTER — Emergency Department
Admission: EM | Admit: 2016-06-27 | Discharge: 2016-06-29 | Disposition: A | Discharge status: Other Acute Inpatient Hospital | Payer: Medicare Other | Attending: Emergency Medicine | Admitting: Emergency Medicine

## 2016-06-27 DIAGNOSIS — Y999 Unspecified external cause status: Secondary | ICD-10-CM | POA: Insufficient documentation

## 2016-06-27 DIAGNOSIS — T1491XA Suicide attempt, initial encounter: Secondary | ICD-10-CM | POA: Diagnosis present

## 2016-06-27 DIAGNOSIS — F322 Major depressive disorder, single episode, severe without psychotic features: Secondary | ICD-10-CM | POA: Diagnosis not present

## 2016-06-27 DIAGNOSIS — X58XXXA Exposure to other specified factors, initial encounter: Secondary | ICD-10-CM | POA: Diagnosis not present

## 2016-06-27 DIAGNOSIS — Z7984 Long term (current) use of oral hypoglycemic drugs: Secondary | ICD-10-CM | POA: Insufficient documentation

## 2016-06-27 DIAGNOSIS — Z79899 Other long term (current) drug therapy: Secondary | ICD-10-CM | POA: Diagnosis not present

## 2016-06-27 DIAGNOSIS — I10 Essential (primary) hypertension: Secondary | ICD-10-CM | POA: Diagnosis not present

## 2016-06-27 DIAGNOSIS — J449 Chronic obstructive pulmonary disease, unspecified: Secondary | ICD-10-CM | POA: Insufficient documentation

## 2016-06-27 DIAGNOSIS — Y929 Unspecified place or not applicable: Secondary | ICD-10-CM | POA: Insufficient documentation

## 2016-06-27 DIAGNOSIS — Y9389 Activity, other specified: Secondary | ICD-10-CM | POA: Insufficient documentation

## 2016-06-27 LAB — COMPREHENSIVE METABOLIC PANEL
ALK PHOS: 39 U/L (ref 38–126)
ALT: 45 U/L (ref 17–63)
AST: 38 U/L (ref 15–41)
Albumin: 4 g/dL (ref 3.5–5.0)
Anion gap: 12 (ref 5–15)
BILIRUBIN TOTAL: 0.7 mg/dL (ref 0.3–1.2)
BUN: 7 mg/dL (ref 6–20)
CALCIUM: 8.9 mg/dL (ref 8.9–10.3)
CO2: 20 mmol/L — ABNORMAL LOW (ref 22–32)
CREATININE: 0.72 mg/dL (ref 0.61–1.24)
Chloride: 105 mmol/L (ref 101–111)
Glucose, Bld: 81 mg/dL (ref 65–99)
Potassium: 4 mmol/L (ref 3.5–5.1)
Sodium: 137 mmol/L (ref 135–145)
TOTAL PROTEIN: 7.5 g/dL (ref 6.5–8.1)

## 2016-06-27 LAB — CBC
HCT: 52.2 % — ABNORMAL HIGH (ref 40.0–52.0)
Hemoglobin: 18 g/dL (ref 13.0–18.0)
MCH: 32.2 pg (ref 26.0–34.0)
MCHC: 34.5 g/dL (ref 32.0–36.0)
MCV: 93.2 fL (ref 80.0–100.0)
PLATELETS: 188 10*3/uL (ref 150–440)
RBC: 5.6 MIL/uL (ref 4.40–5.90)
RDW: 14 % (ref 11.5–14.5)
WBC: 4.8 10*3/uL (ref 3.8–10.6)

## 2016-06-27 NOTE — ED Provider Notes (Signed)
[facility] Emergency Department Provider Note   ____________________________________________   First MD Initiated Contact with Patient 06/27/16 2347     (approximate)  I have reviewed the triage vital signs and the nursing notes.   HISTORY  Chief Complaint Psychiatric Evaluation    HPI Mathew Aguilar is a 65 y.o. male with history of major depression with psychotic features, hypertension, alcohol abuse, multiple suicide attempts in the past who presents under involuntary commitment by police for a suicide attempt last night, gradual onset, severe, no modifying factors or patient reports that he was very depressed last night and he took a one-week supply of "my nighttime medicines". He is followed by act team and receives his medications in a bubble pack, he only took "the nighttime ones". Today he told a friend about this and she called 911 and he was involuntarily committed. Patient reports "I feel fine now". He denies any chest pain or difficulty breathing, no abdominal pain, no vomiting, diarrhea, fevers or chills.   Past Medical History:  Diagnosis Date  . Depression   . Hypertension   . Restless leg     Patient Active Problem List   Diagnosis Date Noted  . COPD (chronic obstructive pulmonary disease) ([place]) 04/03/2016  . Alcohol use disorder, mild, abuse 02/27/2016  . Severe recurrent major depression with psychotic features ([place]) 02/27/2016  . Suicidal ideation 02/03/2016  . GERD (gastroesophageal reflux disease) 02/19/2015  . Essential hypertension 02/18/2015  . Restless legs syndrome 02/18/2015    Past Surgical History:  Procedure Laterality Date  . AMPUTATION ARM      Prior to Admission medications   Medication Sig Start Date End Date Taking? Authorizing Provider  atenolol (TENORMIN) 25 MG tablet Take 25 mg by mouth daily.   Yes Historical Provider, MD  DULoxetine (CYMBALTA) 60 MG capsule Take 2 capsules (120 mg total) by mouth  daily. Patient taking differently: Take 60 mg by mouth 2 (two) times daily.  04/06/16  Yes [name], MD  hydrochlorothiazide (MICROZIDE) 12.5 MG capsule Take 1 capsule (12.5 mg total) by mouth daily. 04/06/16  Yes [name], MD  lamoTRIgine (LAMICTAL) 25 MG tablet Take 50 mg by mouth daily.   Yes Historical Provider, MD  levocetirizine (XYZAL) 5 MG tablet Take 5 mg by mouth every evening.   Yes Historical Provider, MD  levothyroxine (SYNTHROID, LEVOTHROID) 75 MCG tablet Take 1 tablet (75 mcg total) by mouth daily before breakfast. 04/06/16  Yes [name], MD  metFORMIN (GLUCOPHAGE) 500 MG tablet Take 1 tablet (500 mg total) by mouth daily. 04/06/16  Yes [name], MD  naltrexone (DEPADE) 50 MG tablet Take 50 mg by mouth daily.  02/24/16  Yes Historical Provider, MD  omega-3 acid ethyl esters (LOVAZA) 1 g capsule Take 1 g by mouth daily.   Yes Historical Provider, MD  omeprazole (PRILOSEC) 20 MG capsule Take 20 mg by mouth daily.   Yes Historical Provider, MD  QUEtiapine (SEROQUEL) 100 MG tablet Take 100 mg by mouth at bedtime.   Yes Historical Provider, MD  rOPINIRole (REQUIP) 3 MG tablet Take 1 tablet (3 mg total) by mouth at bedtime. Patient taking differently: Take 6 mg by mouth at bedtime.  04/06/16  Yes [name], MD  traZODone (DESYREL) 100 MG tablet Take 200 mg by mouth at bedtime.   Yes Historical Provider, MD    Allergies Review of patient's allergies indicates no known allergies.  Family History  Problem Relation Age of Onset  .  Family history unknown: Yes    Social History Social History  Substance Use Topics  . Smoking status: Never Smoker  . Smokeless tobacco: Never Used  . Alcohol use No     Comment: history of ETOH abuse - sober several months    Review of Systems Constitutional: No fever/chills Eyes: No visual changes. ENT: No sore throat. Cardiovascular: Denies chest pain. Respiratory:  Denies shortness of breath. Gastrointestinal: No abdominal pain.  No nausea, no vomiting.  No diarrhea.  No constipation. Genitourinary: Negative for dysuria. Musculoskeletal: Negative for back pain. Skin: Negative for rash. Neurological: Negative for headaches, focal weakness or numbness.  10-point ROS otherwise negative.  ____________________________________________   PHYSICAL EXAM:  VITAL SIGNS: ED Triage Vitals  Enc Vitals Group     BP 06/27/16 2328 104/60     Pulse Rate 06/27/16 2328 71     Resp 06/27/16 2328 18     Temp 06/27/16 2328 97.7 F (36.5 C)     Temp Source 06/27/16 2328 Oral     SpO2 06/27/16 2328 97 %     Weight 06/27/16 2329 150 lb (68 kg)     Height 06/27/16 2329 5' (1.524 m)     Head Circumference --      Peak Flow --      Pain Score --      Pain Loc --      Pain Edu? --      Excl. in [place]? --     Constitutional: Alert and oriented. Well appearing and in no acute distress. Eyes: Conjunctivae are normal. PERRL. EOMI. Head: Atraumatic. Nose: No congestion/rhinnorhea. Mouth/Throat: Mucous membranes are moist.  Oropharynx non-erythematous. Neck: No stridor. Supple without meningismus. Cardiovascular: Normal rate, regular rhythm. Grossly normal heart sounds.  Good peripheral circulation. Respiratory: Normal respiratory effort.  No retractions. Lungs CTAB. Gastrointestinal: Soft and nontender. No distention. No CVA tenderness. Genitourinary: deferred Musculoskeletal: No lower extremity tenderness nor edema.  No joint effusions. Neurologic:  Normal speech and language. No gross focal neurologic deficits are appreciated. No gait instability. Skin:  Skin is warm, dry and intact. No rash noted. Psychiatric: Mood is depressed and affect is restricted. Speech and behavior are normal.  ____________________________________________   LABS (all labs ordered are listed, but only abnormal results are displayed)  Labs Reviewed  COMPREHENSIVE METABOLIC PANEL -  Abnormal; Notable for the following:       Result Value   CO2 20 (*)    All other components within normal limits  ETHANOL - Abnormal; Notable for the following:    Alcohol, Ethyl (B) 112 (*)    All other components within normal limits  ACETAMINOPHEN LEVEL - Abnormal; Notable for the following:    Acetaminophen (Tylenol), Serum <10 (*)    All other components within normal limits  CBC - Abnormal; Notable for the following:    HCT 52.2 (*)    All other components within normal limits  SALICYLATE LEVEL  URINE DRUG SCREEN, QUALITATIVE ([facility] ONLY)   ____________________________________________  EKG  ED ECG REPORT I, Joanne Gavel, the attending physician, personally viewed and interpreted this ECG.   Date: 06/28/2016  EKG Time: 00:01  Rate: 82  Rhythm: normal sinus rhythm with frequent PVCs.  Axis: normal  Intervals:none  ST&T Change: No acute ST elevation or acute ST depression. Normal QTC. Normal QRS.  ____________________________________________  RADIOLOGY  none ____________________________________________   PROCEDURES  Procedure(s) performed: None  Procedures  Critical Care performed: No  ____________________________________________   INITIAL IMPRESSION /  ASSESSMENT AND PLAN / ED COURSE  Pertinent labs & imaging results that were available during my care of the patient were reviewed by me and considered in my medical decision making (see chart for details).  ELAY TRANI is a 65 y.o. male with history of major depression with psychotic features, hypertension, alcohol abuse, multiple suicide attempts in the past who presents under involuntary commitment by police for a suicide attempt last night (Friday). On exam, he is well-appearing and in no acute distress. Vital signs stable and he is afebrile. He has no acute complaints and benign as periumbilical examination. His activity and is faxing over the medications that he is prescribed nightly so that we can  have a better idea of what he ingested though at this time he is likely greater than 24 hours out from ingestion unlikely to have any adverse effects. We'll obtain screening labs, continue IVC, consult TTS as well as psychiatry.  ----------------------------------------- 2:03 AM on 06/28/2016 ----------------------------------------- I reviewed the patient's labs are generally unremarkable. Undetectable acetaminophen and salicylate levels. Ethanol was elevated at 112. Negative urine drug screen. I reviewed patient's current medication list, if he had indeed taking all of his nightime meds, he would have taken the following:  Trazodone 1400 mg Xyzal 35 mg Cymbalta 420 mg Seroquel 700 mg Requip 21 mg  I discussed this with the [facility] and they agree given his reassuring labs, the fact that he is asymptomatic in over 24 hours out from ingestion, he is medically cleared and not expected to experience any side effects or adverse effects related to this ingestion.  ----------------------------------------- 4:56 AM on 06/28/2016 ----------------------------------------- Telemetry psychiatrist on call recommends inpatient admission. I have discontinued the Cymbalta per our discussion and ordered Zoloft per his recommendations.  Clinical Course     ____________________________________________   FINAL CLINICAL IMPRESSION(S) / ED DIAGNOSES  Final diagnoses:  Suicide attempt      NEW MEDICATIONS STARTED DURING THIS VISIT:  New Prescriptions   No medications on file     Note:  This document was prepared using Dragon voice recognition software and may include unintentional dictation errors.    Joanne Gavel, MD 06/28/16 [phone]

## 2016-06-27 NOTE — ED Triage Notes (Signed)
Patient brought to the ED by [facility] PD.  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  Today denies having SI, just feeling depressed.

## 2016-06-28 DIAGNOSIS — F322 Major depressive disorder, single episode, severe without psychotic features: Secondary | ICD-10-CM | POA: Diagnosis not present

## 2016-06-28 LAB — URINE DRUG SCREEN, QUALITATIVE (ARMC ONLY)
AMPHETAMINES, UR SCREEN: NOT DETECTED
BENZODIAZEPINE, UR SCRN: NOT DETECTED
Barbiturates, Ur Screen: NOT DETECTED
CANNABINOID 50 NG, UR ~~LOC~~: NOT DETECTED
Cocaine Metabolite,Ur ~~LOC~~: NOT DETECTED
MDMA (ECSTASY) UR SCREEN: NOT DETECTED
Methadone Scn, Ur: NOT DETECTED
Opiate, Ur Screen: NOT DETECTED
Phencyclidine (PCP) Ur S: NOT DETECTED
TRICYCLIC, UR SCREEN: NOT DETECTED

## 2016-06-28 LAB — ETHANOL: Alcohol, Ethyl (B): 112 mg/dL — ABNORMAL HIGH (ref <5)

## 2016-06-28 LAB — ACETAMINOPHEN LEVEL

## 2016-06-28 LAB — SALICYLATE LEVEL: Salicylate Lvl: <7 mg/dL (ref 2.8–30.0)

## 2016-06-28 MED ORDER — ROPINIROLE HCL 1 MG PO TABS
3.0000 mg | ORAL_TABLET | Freq: Every day | ORAL | Status: DC
  Administered 2016-06-28 (×2): 3 mg via ORAL
  Filled 2016-06-28 (×3): qty 3

## 2016-06-28 MED ORDER — ATENOLOL 25 MG PO TABS
25.0000 mg | ORAL_TABLET | Freq: Every day | ORAL | Status: DC
  Administered 2016-06-28 – 2016-06-29 (×2): 25 mg via ORAL
  Filled 2016-06-28 (×2): qty 1

## 2016-06-28 MED ORDER — HYDROCHLOROTHIAZIDE 12.5 MG PO CAPS
12.5000 mg | ORAL_CAPSULE | Freq: Every day | ORAL | Status: DC
  Administered 2016-06-28 – 2016-06-29 (×2): 12.5 mg via ORAL
  Filled 2016-06-28 (×2): qty 1

## 2016-06-28 MED ORDER — TRAZODONE HCL 100 MG PO TABS
200.0000 mg | ORAL_TABLET | Freq: Every day | ORAL | Status: DC
  Administered 2016-06-28 (×2): 200 mg via ORAL
  Filled 2016-06-28 (×2): qty 2

## 2016-06-28 MED ORDER — QUETIAPINE FUMARATE 25 MG PO TABS
100.0000 mg | ORAL_TABLET | Freq: Every day | ORAL | Status: DC
  Administered 2016-06-28 (×2): 100 mg via ORAL
  Filled 2016-06-28 (×2): qty 4

## 2016-06-28 MED ORDER — METFORMIN HCL 500 MG PO TABS
500.0000 mg | ORAL_TABLET | Freq: Every day | ORAL | Status: DC
  Administered 2016-06-28 – 2016-06-29 (×2): 500 mg via ORAL
  Filled 2016-06-28 (×3): qty 1

## 2016-06-28 MED ORDER — NALTREXONE HCL 50 MG PO TABS
50.0000 mg | ORAL_TABLET | Freq: Every day | ORAL | Status: DC
  Administered 2016-06-28 – 2016-06-29 (×2): 50 mg via ORAL
  Filled 2016-06-28 (×2): qty 1

## 2016-06-28 MED ORDER — SERTRALINE HCL 50 MG PO TABS
50.0000 mg | ORAL_TABLET | Freq: Every day | ORAL | Status: DC
Start: 2016-06-28 — End: 2016-06-29
  Administered 2016-06-28 – 2016-06-29 (×2): 50 mg via ORAL
  Filled 2016-06-28 (×2): qty 1

## 2016-06-28 MED ORDER — LEVOTHYROXINE SODIUM 75 MCG PO TABS
75.0000 ug | ORAL_TABLET | Freq: Every day | ORAL | Status: DC
Start: 2016-06-28 — End: 2016-06-29
  Administered 2016-06-28 – 2016-06-29 (×2): 75 ug via ORAL
  Filled 2016-06-28 (×2): qty 1

## 2016-06-28 MED ORDER — LORATADINE 10 MG PO TABS
10.0000 mg | ORAL_TABLET | Freq: Every evening | ORAL | Status: DC
  Administered 2016-06-28: 10 mg via ORAL
  Filled 2016-06-28: qty 1

## 2016-06-28 MED ORDER — DULOXETINE HCL 60 MG PO CPEP: 120.0000 mg | ORAL_CAPSULE | Freq: Every day | ORAL | Status: DC

## 2016-06-28 MED ORDER — LAMOTRIGINE 25 MG PO TABS
50.0000 mg | ORAL_TABLET | Freq: Every day | ORAL | Status: DC
  Administered 2016-06-28 – 2016-06-29 (×2): 50 mg via ORAL
  Filled 2016-06-28 (×2): qty 2

## 2016-06-28 MED ORDER — PANTOPRAZOLE SODIUM 40 MG PO TBEC
40.0000 mg | DELAYED_RELEASE_TABLET | Freq: Every day | ORAL | Status: DC
  Administered 2016-06-28 – 2016-06-29 (×2): 40 mg via ORAL
  Filled 2016-06-28 (×2): qty 1

## 2016-06-28 NOTE — ED Provider Notes (Addendum)
-----------------------------------------   12:38 PM on 06/28/2016 -----------------------------------------   Blood pressure 111/67, pulse 75, temperature 98 F (36.7 C), temperature source Oral, resp. rate 18, height 5' (1.524 m), weight 150 lb (68 kg), SpO2 99 %.  The patient had no acute events since last update.  Calm and cooperative at this time.  Disposition is pending Psychiatry/Behavioral Medicine team recommendations.  Patient has been accepted to [facility] bed will not be ready until tomorrow.   Nena Polio, MD 06/28/16 1238    Nena Polio, MD 06/28/16 [phone]

## 2016-06-28 NOTE — ED Notes (Signed)
Report was received from Marinda Elk., RN; Pt. Verbalizes having depression; with  S.I.; and with stating that; he took an overdose of his medication this past Thursday; in a suicide attempt; that, he called the ACT Team and told them what he had done; also drank alcohol; with BAC: 112; denies having any H.I.

## 2016-06-28 NOTE — BH Assessment (Signed)
Referral information for Psychiatric Hospitalization faxed to;    [facility] [phone]) [phone] ext. [place] [phone]),    [facility] [phone] or [phone]),    [facility] [phone]),    [place] [phone]),    [facility] [phone]),    [facility] [phone])   [facility])

## 2016-06-28 NOTE — BH Assessment (Signed)
Patient has been accepted to [facility].  Patient assigned to room Geriatric Unit Accepting physician is Dr. Jonelle Sports.  Call report to [phone].  Representative was [place].  ER Staff is aware of it Edd Arbour, ER Sect.; Dr. Cinda Quest, ER MD & Amy H., Patient's Nurse)  Patient bed will be available tomorrow (06/29/2016) after 10am.

## 2016-06-28 NOTE — ED Notes (Signed)
Patient resting quietly in room. No noted distress or abnormal behaviors noted. Will continue 15 minute checks and observation by security camera for safety. 

## 2016-06-28 NOTE — BH Assessment (Signed)
Per Lindon Romp, NP - patient will be re-assessed by the Psychiatrist in the morning for a final disposition.

## 2016-06-28 NOTE — ED Notes (Signed)

## 2016-06-28 NOTE — BH Assessment (Addendum)
Tele Assessment Note    Mathew Aguilar is a 65 year old white male that was brought to the ED by his ACTT worker.  Collateral information obtained from the ACTT worker Artist Pais [phone]) reports that the patient told her that he took an overdose of his medication tonight.   During the assessment the patient denies SI/HI/Psychosis/Substance Abuse.  Patient only stated over and over again, "I am OK now".  Patient refused to talk about stating that he took an overdose of medication.  Patient is a poor historian.  Patient refused to answer most of the throughout the assessment.  Patient's only response was, "I am OK now.    Per chart review patient has been hospitalized at [facility] inpatient psychiatric unit in July 2017 due to [place].  Per ACTT Team worker patient has had multiple suicide attempts in the past.  Patient is not compliant with taking his psychiatric medication.   Patient denies substance abuse; however, his BAL is 112.  Patient denies HI and Psychosis.    Diagnosis: Major Depressive Disorder, Severe, without psychosis   Past Medical History:  Past Medical History:  Diagnosis Date  . Depression   . Hypertension   . Restless leg     Past Surgical History:  Procedure Laterality Date  . AMPUTATION ARM      Family History:  Family History  Problem Relation Age of Onset  . Family history unknown: Yes    Social History:  reports that he has never smoked. He has never used smokeless tobacco. He reports that he does not drink alcohol or use drugs.  Additional Social History:  Alcohol / Drug Use History of alcohol / drug use?:  (Patient denies substance abuse; however, his BAL is 112)  CIWA: CIWA-Ar BP: 104/60 Pulse Rate: 71 COWS:    PATIENT STRENGTHS: (choose at least two) Capable of independent living Communication skills Physical Health  Allergies: No Known Allergies  Home Medications:  (Not in a hospital admission)  OB/GYN Status:  No LMP for male  patient.  General Assessment Data Location of Assessment: [facility] ED TTS Assessment: In system Is this a Tele or Face-to-Face Assessment?: Tele Assessment Is this an Initial Assessment or a Re-assessment for this encounter?: Initial Assessment Marital status: Single Is patient pregnant?: No Pregnancy Status: No Living Arrangements: Alone Can pt return to current living arrangement?: Yes Admission Status: Involuntary Is patient capable of signing voluntary admission?: No Referral Source: Other Acupuncturist Team) Insurance type: [facility]  Medical Screening Exam ([place]) Medical Exam completed:  (NA)  Crisis Care Plan Living Arrangements: Alone Legal Guardian:  (NA) Name of Psychiatrist: ACTT Team Name of Therapist: ACTT Team  Education Status Is patient currently in school?: No Current Grade: NA Highest grade of school patient has completed: NA Name of school: NA Contact person: NA  Risk to self with the past 6 months Suicidal Ideation: Yes-Currently Present Has patient been a risk to self within the past 6 months prior to admission? : Yes Suicidal Intent: Yes-Currently Present Has patient had any suicidal intent within the past 6 months prior to admission? : Yes Is patient at risk for suicide?: Yes Suicidal Plan?: Yes-Currently Present Has patient had any suicidal plan within the past 6 months prior to admission? : Yes Specify Current Suicidal Plan: Per ACTT Team - overdose Access to Means: Yes Specify Access to Suicidal Means: Pills What has been your use of drugs/alcohol within the last 12 months?: Alcohol Previous Attempts/Gestures: Yes How many times?:  (  Mutiple) Other Self Harm Risks: None Reported Triggers for Past Attempts: Unpredictable Intentional Self Injurious Behavior: None Family Suicide History: No Recent stressful life event(s): Other (Comment) (Patienr believes that no one cares for him. ) Persecutory voices/beliefs?: No Depression: Yes Depression  Symptoms: Despondent, Insomnia, Tearfulness, Isolating, Fatigue, Guilt, Loss of interest in usual pleasures, Feeling worthless/self pity Substance abuse history and/or treatment for substance abuse?: Yes Suicide prevention information given to non-admitted patients: Yes  Risk to Others within the past 6 months Homicidal Ideation: No Does patient have any lifetime risk of violence toward others beyond the six months prior to admission? : No Thoughts of Harm to Others: No Current Homicidal Intent: No Current Homicidal Plan: No Access to Homicidal Means: No Identified Victim: None Reported History of harm to others?: No Assessment of Violence: None Noted Violent Behavior Description: None Reported Does patient have access to weapons?: No Criminal Charges Pending?: No Does patient have a court date: No Is patient on probation?: No  Psychosis Hallucinations: None noted Delusions: Grandiose (Believes that no one cares for him)  Mental Status Report Appearance/Hygiene: Disheveled Eye Contact: Fair Motor Activity: Freedom of movement Speech: Soft Level of Consciousness: Alert, Restless Mood: Depressed, Despair, Helpless Affect: Depressed Anxiety Level: Minimal Thought Processes: Coherent, Relevant Judgement: Impaired Orientation: Person, Place, Time, Situation Obsessive Compulsive Thoughts/Behaviors: None  Cognitive Functioning Concentration: Decreased Memory: Recent Intact, Remote Intact IQ: Average Insight: Fair Impulse Control: Poor Appetite: Fair Weight Loss: 0 Weight Gain: 0 Sleep: Unable to Assess (Pt refused to answer the question ) Total Hours of Sleep:  (UTA) Vegetative Symptoms: Decreased grooming, Staying in bed  ADLScreening [facility] Assessment Services) Patient's cognitive ability adequate to safely complete daily activities?: Yes Patient able to express need for assistance with ADLs?: Yes Independently performs ADLs?: Yes (appropriate for developmental  age)  Prior Inpatient Therapy Prior Inpatient Therapy: Yes Prior Therapy Dates: 2017 Prior Therapy Facilty/Provider(s): [facility] Reason for Treatment: SI  Prior Outpatient Therapy Prior Outpatient Therapy: Yes Prior Therapy Dates: Ongoing Prior Therapy Facilty/Provider(s): [facility] Reason for Treatment: ACTT Services Does patient have an ACCT team?: Yes Does patient have Intensive In-House Services?  : No Does patient have [facility] services? : No Does patient have P4CC services?: No  ADL Screening (condition at time of admission) Patient's cognitive ability adequate to safely complete daily activities?: Yes Is the patient deaf or have difficulty hearing?: No Does the patient have difficulty seeing, even when wearing glasses/contacts?: No Does the patient have difficulty concentrating, remembering, or making decisions?: No Patient able to express need for assistance with ADLs?: Yes Does the patient have difficulty dressing or bathing?: No Independently performs ADLs?: Yes (appropriate for developmental age) Does the patient have difficulty walking or climbing stairs?: No Weakness of Legs: None Weakness of Arms/Hands: None  Home Assistive Devices/Equipment Home Assistive Devices/Equipment: None    Abuse/Neglect Assessment (Assessment to be complete while patient is alone) Physical Abuse: Denies Verbal Abuse: Denies Sexual Abuse: Denies Exploitation of patient/patient's resources: Denies Self-Neglect: Denies Values / Beliefs Cultural Requests During Hospitalization: None Spiritual Requests During Hospitalization: None Consults Spiritual Care Consult Needed: No Social Work Consult Needed: No Regulatory affairs officer (For Healthcare) Does patient have an advance directive?: No Would patient like information on creating an advanced directive?: No - patient declined information    Additional Information 1:1 In Past 12 Months?: No CIRT Risk: No Elopement Risk: No Does patient  have medical clearance?: Yes     Disposition:  Disposition Initial Assessment Completed for this Encounter: Yes  [place], [place] 06/28/2016 1:09 AM

## 2016-06-28 NOTE — ED Notes (Signed)
Patient received lunch tray and beverage.

## 2016-06-28 NOTE — ED Notes (Signed)
Report to Joycelyn Schmid, RN in ED BHU.

## 2016-06-28 NOTE — ED Notes (Signed)
Patient wanting to be discharged, stated he came to the hospital voluntarily. Patient was told that MD initiated IVC papers secondary to overdose. Patient understood and had no further questions. Maintained on 15 minute checks and observation by security camera for safety.

## 2016-06-28 NOTE — ED Notes (Signed)

## 2016-06-28 NOTE — ED Notes (Signed)
Report was received from [place]., RN; Pt. Mathew Aguilar, "I'm ready to leave; I didn't mean what I said; I took the pills on Thursday; I'm ready to go."; denies S.I./Hi. Continue to monitor with 15 min. Monitoring.

## 2016-06-28 NOTE — ED Notes (Signed)
BEHAVIORAL HEALTH ROUNDING  Patient sleeping: No.  Patient alert and oriented: yes  Behavior appropriate: Yes. ; If no, describe:  Nutrition and fluids offered: Yes  Toileting and hygiene offered: Yes  Sitter present: not applicable, Q 15 min safety rounds and observation.  Law enforcement present: Yes ODS  

## 2016-06-28 NOTE — ED Notes (Signed)
Pt walked to ED BHU with EDT and officer.

## 2016-06-28 NOTE — ED Notes (Signed)
Patient in common area.  Maintained on 15 minute checks and observation by security camera for safety.

## 2016-06-28 NOTE — ED Notes (Signed)

## 2016-06-28 NOTE — BH Assessment (Signed)
Writer requested to have the Williams placed in the patient's room.

## 2016-06-28 NOTE — ED Notes (Signed)
Patient is IVC/ Accepted to [facility], bed will be ready on 06/29/16 after 10 am.

## 2016-06-28 NOTE — ED Notes (Signed)
Patient reports he is not feeling suicidal and would like to leave because he "has things to do." He was reminded that he was on commitment papers and would need the physician to recommend rescinding and discharge. Maintained on 15 minute checks and observation by security camera for safety.

## 2016-06-28 NOTE — ED Notes (Signed)
Patient out in common area, watching television. In no apparent distress. Maintained on 15 minute checks and observation by security camera for safety.

## 2016-06-28 NOTE — ED Notes (Signed)

## 2016-06-28 NOTE — ED Notes (Signed)
Report given to [facility] Dr Bobbe Medico.

## 2016-06-28 NOTE — ED Notes (Signed)
Pt brought in under IVC by [employer] after calling his ACT team and telling them he took an over dose of his medications on Thursday night. Pt unable to tell what medications he took and is stating he is ok now and wants to go home. Pt cooperative but slightly anxious at this time. Pt easily redirected.

## 2016-06-28 NOTE — ED Notes (Signed)
Maintained on 15 minute checks and observation by security camera for safety. 

## 2016-06-29 DIAGNOSIS — F322 Major depressive disorder, single episode, severe without psychotic features: Secondary | ICD-10-CM | POA: Diagnosis not present

## 2016-06-29 NOTE — ED Provider Notes (Signed)
-----------------------------------------   8:30 AM on 06/29/2016 -----------------------------------------   Blood pressure 117/68, pulse 68, temperature 97.8 F (36.6 C), temperature source Oral, resp. rate 16, height 5' (1.524 m), weight 150 lb (68 kg), SpO2 98 %.  The patient had no acute events since last update.  Calm and cooperative at this time.  Patient is accepted to [facility] for further inpatient psychiatric care. He remains medically stable, and is suitable for nonmedical transport.    Carrie Mew, MD 06/29/16 0830

## 2016-06-29 NOTE — ED Provider Notes (Signed)
-----------------------------------------   6:59 AM on 06/29/2016 -----------------------------------------   Blood pressure 117/68, pulse 68, temperature 97.8 F (36.6 C), temperature source Oral, resp. rate 16, height 5' (1.524 m), weight 150 lb (68 kg), SpO2 98 %.  The patient had no acute events since last update.  Calm and cooperative at this time.  Disposition is pending Psychiatry/Behavioral Medicine team recommendations.     Paulette Blanch, MD 06/29/16 [phone]

## 2016-07-08 ENCOUNTER — Emergency Department
Admission: EM | Admit: 2016-07-08 | Discharge: 2016-07-08 | Disposition: A | Discharge status: Home / Self Care | Payer: Medicare Other | Attending: Emergency Medicine | Admitting: Emergency Medicine

## 2016-07-08 DIAGNOSIS — I10 Essential (primary) hypertension: Secondary | ICD-10-CM | POA: Diagnosis not present

## 2016-07-08 DIAGNOSIS — F329 Major depressive disorder, single episode, unspecified: Secondary | ICD-10-CM | POA: Diagnosis not present

## 2016-07-08 DIAGNOSIS — Z7984 Long term (current) use of oral hypoglycemic drugs: Secondary | ICD-10-CM | POA: Diagnosis not present

## 2016-07-08 DIAGNOSIS — J449 Chronic obstructive pulmonary disease, unspecified: Secondary | ICD-10-CM | POA: Diagnosis not present

## 2016-07-08 DIAGNOSIS — F32A Depression, unspecified: Secondary | ICD-10-CM

## 2016-07-08 DIAGNOSIS — R45851 Suicidal ideations: Secondary | ICD-10-CM | POA: Diagnosis present

## 2016-07-08 DIAGNOSIS — Z79899 Other long term (current) drug therapy: Secondary | ICD-10-CM | POA: Diagnosis not present

## 2016-07-08 DIAGNOSIS — F332 Major depressive disorder, recurrent severe without psychotic features: Secondary | ICD-10-CM

## 2016-07-08 DIAGNOSIS — F1010 Alcohol abuse, uncomplicated: Secondary | ICD-10-CM | POA: Diagnosis present

## 2016-07-08 DIAGNOSIS — K219 Gastro-esophageal reflux disease without esophagitis: Secondary | ICD-10-CM | POA: Diagnosis present

## 2016-07-08 LAB — CBC WITH DIFFERENTIAL/PLATELET
BASOS ABS: 0.3 10*3/uL — AB (ref 0–0.1)
BASOS PCT: 4 %
Eosinophils Absolute: 0.1 10*3/uL (ref 0–0.7)
Eosinophils Relative: 1 %
HEMATOCRIT: 54.6 % — AB (ref 40.0–52.0)
HEMOGLOBIN: 18.9 g/dL — AB (ref 13.0–18.0)
Lymphocytes Relative: 21 %
Lymphs Abs: 1.4 10*3/uL (ref 1.0–3.6)
MCH: 31.9 pg (ref 26.0–34.0)
MCHC: 34.6 g/dL (ref 32.0–36.0)
MCV: 92.3 fL (ref 80.0–100.0)
Monocytes Absolute: 0.9 10*3/uL (ref 0.2–1.0)
Monocytes Relative: 14 %
NEUTROS ABS: 4 10*3/uL (ref 1.4–6.5)
NEUTROS PCT: 60 %
Platelets: 269 10*3/uL (ref 150–440)
RBC: 5.91 MIL/uL — AB (ref 4.40–5.90)
RDW: 14 % (ref 11.5–14.5)
WBC: 6.6 10*3/uL (ref 3.8–10.6)

## 2016-07-08 LAB — COMPREHENSIVE METABOLIC PANEL
ALBUMIN: 4.4 g/dL (ref 3.5–5.0)
ALT: 73 U/L — AB (ref 17–63)
ANION GAP: 10 (ref 5–15)
AST: 44 U/L — ABNORMAL HIGH (ref 15–41)
Alkaline Phosphatase: 51 U/L (ref 38–126)
BUN: 12 mg/dL (ref 6–20)
CHLORIDE: 101 mmol/L (ref 101–111)
CO2: 25 mmol/L (ref 22–32)
Calcium: 9.5 mg/dL (ref 8.9–10.3)
Creatinine, Ser: 0.66 mg/dL (ref 0.61–1.24)
GFR calc non Af Amer: >60 mL/min (ref >60)
GLUCOSE: 59 mg/dL — AB (ref 65–99)
Potassium: 4 mmol/L (ref 3.5–5.1)
SODIUM: 136 mmol/L (ref 135–145)
Total Bilirubin: 1.2 mg/dL (ref 0.3–1.2)
Total Protein: 8.3 g/dL — ABNORMAL HIGH (ref 6.5–8.1)

## 2016-07-08 LAB — URINE DRUG SCREEN, QUALITATIVE (ARMC ONLY)
AMPHETAMINES, UR SCREEN: NOT DETECTED
BENZODIAZEPINE, UR SCRN: NOT DETECTED
Barbiturates, Ur Screen: NOT DETECTED
COCAINE METABOLITE, UR ~~LOC~~: NOT DETECTED
Cannabinoid 50 Ng, Ur ~~LOC~~: NOT DETECTED
MDMA (ECSTASY) UR SCREEN: NOT DETECTED
METHADONE SCREEN, URINE: NOT DETECTED
OPIATE, UR SCREEN: NOT DETECTED
Phencyclidine (PCP) Ur S: NOT DETECTED
Tricyclic, Ur Screen: NOT DETECTED

## 2016-07-08 LAB — ETHANOL: Alcohol, Ethyl (B): <5 mg/dL (ref <5)

## 2016-07-08 LAB — ACETAMINOPHEN LEVEL

## 2016-07-08 LAB — SALICYLATE LEVEL

## 2016-07-08 NOTE — ED Triage Notes (Addendum)
Pt reports depression, denies SI, HI, hallucinations. ACT team NP reports pt just d/c from mental health hospital after OD. Pt reports to NP that he is going to kill himself.

## 2016-07-08 NOTE — ED Provider Notes (Signed)
[facility] Emergency Department Provider Note  ____________________________________________   First MD Initiated Contact with Patient 07/08/16 1647     (approximate)  I have reviewed the triage vital signs and the nursing notes.   HISTORY  Chief Complaint No chief complaint on file.    HPI Mathew Aguilar is a 65 y.o. male with extensive psychiatric illness who was brought to the emergency department by his ACT team with concerns of suicidal ideation.  The patient was last seen in this emergency department about 9 days ago when he was transferred to [facility].  Reportedly he was just discharged yesterday and when he followed up with the act team today, he told them he wanted to kill himself, gave several ways in which he would do so including overdose which she has tried previously,and confirmed his suicidal ideation.  When he got to the emergency department and after the act team the left, he tried to deny his symptoms but when he was questioned about then became tearful and admitted to me that he does want to kill himself and that he is depressed.  He states he wants to go back to "that place in [place]".  He denies any current medical symptoms as documented in the review of systems below.   Past Medical History:  Diagnosis Date  . Depression   . Hypertension   . Restless leg     Patient Active Problem List   Diagnosis Date Noted  . COPD (chronic obstructive pulmonary disease) ([place]) 04/03/2016  . Alcohol use disorder, mild, abuse 02/27/2016  . Severe recurrent major depression with psychotic features ([place]) 02/27/2016  . Suicidal ideation 02/03/2016  . GERD (gastroesophageal reflux disease) 02/19/2015  . Essential hypertension 02/18/2015  . Restless legs syndrome 02/18/2015    Past Surgical History:  Procedure Laterality Date  . AMPUTATION ARM      Prior to Admission medications   Medication Sig Start Date End Date Taking? Authorizing  Provider  atenolol (TENORMIN) 25 MG tablet Take 25 mg by mouth daily.   Yes Historical Provider, MD  hydrochlorothiazide (MICROZIDE) 12.5 MG capsule Take 1 capsule (12.5 mg total) by mouth daily. 04/06/16  Yes [name], MD  lamoTRIgine (LAMICTAL) 25 MG tablet Take 50 mg by mouth daily.   Yes Historical Provider, MD  levocetirizine (XYZAL) 5 MG tablet Take 5 mg by mouth every evening.   Yes Historical Provider, MD  levothyroxine (SYNTHROID, LEVOTHROID) 75 MCG tablet Take 1 tablet (75 mcg total) by mouth daily before breakfast. 04/06/16  Yes [name], MD  loratadine (CLARITIN) 10 MG tablet Take 10 mg by mouth daily.   Yes Historical Provider, MD  metFORMIN (GLUCOPHAGE) 500 MG tablet Take 1 tablet (500 mg total) by mouth daily. 04/06/16  Yes [name], MD  naltrexone (DEPADE) 50 MG tablet Take 50 mg by mouth daily.  02/24/16  Yes Historical Provider, MD  omega-3 acid ethyl esters (LOVAZA) 1 g capsule Take 1 g by mouth daily.   Yes Historical Provider, MD  omeprazole (PRILOSEC) 20 MG capsule Take 20 mg by mouth daily.   Yes Historical Provider, MD  pantoprazole (PROTONIX) 40 MG tablet Take 40 mg by mouth daily.   Yes Historical Provider, MD  QUEtiapine (SEROQUEL) 100 MG tablet Take 150 mg by mouth at bedtime.    Yes Historical Provider, MD  rOPINIRole (REQUIP) 3 MG tablet Take 1 tablet (3 mg total) by mouth at bedtime. Patient taking differently: Take 6 mg by mouth at  bedtime.  04/06/16  Yes [name], MD  sertraline (ZOLOFT) 50 MG tablet Take 50 mg by mouth daily.   Yes Historical Provider, MD  traZODone (DESYREL) 100 MG tablet Take 200 mg by mouth at bedtime.   Yes Historical Provider, MD  DULoxetine (CYMBALTA) 60 MG capsule Take 2 capsules (120 mg total) by mouth daily. Patient taking differently: Take 60 mg by mouth 2 (two) times daily.  04/06/16   [name], MD    Allergies Review of patient's allergies indicates no  known allergies.  Family History  Problem Relation Age of Onset  . Family history unknown: Yes    Social History Social History  Substance Use Topics  . Smoking status: Never Smoker  . Smokeless tobacco: Never Used  . Alcohol use No     Comment: history of ETOH abuse - sober several months    Review of Systems Constitutional: No fever/chills Eyes: No visual changes. ENT: No sore throat. Cardiovascular: Denies chest pain. Respiratory: Denies shortness of breath. Gastrointestinal: No abdominal pain.  No nausea, no vomiting.  No diarrhea.  No constipation. Genitourinary: Negative for dysuria. Musculoskeletal: Negative for back pain. Skin: Negative for rash. Neurological: Negative for headaches, focal weakness or numbness. Psychiatric:Depression, SI w/ plan  10-point ROS otherwise negative.  ____________________________________________   PHYSICAL EXAM:  VITAL SIGNS: ED Triage Vitals [07/08/16 1558]  Enc Vitals Group     BP 139/81     Pulse Rate 65     Resp 16     Temp 97.7 F (36.5 C)     Temp Source Oral     SpO2 98 %     Weight      Height      Head Circumference      Peak Flow      Pain Score      Pain Loc      Pain Edu?      Excl. in [place]?     Constitutional: Alert and oriented. Well appearing and in no acute distress. Eyes: Conjunctivae are normal. PERRL. EOMI. Head: Atraumatic. Nose: No congestion/rhinnorhea. Mouth/Throat: Mucous membranes are moist.  Oropharynx non-erythematous. Neck: No stridor.  No meningeal signs.   Cardiovascular: Normal rate, regular rhythm. Good peripheral circulation. Grossly normal heart sounds. Respiratory: Normal respiratory effort.  No retractions. Lungs CTAB. Gastrointestinal: Soft and nontender. No distention.  Musculoskeletal: Surgically absent RUE.  No lower extremity tenderness nor edema.  Neurologic:  Normal speech and language. No gross focal neurologic deficits are appreciated.  Skin:  Skin is warm, dry and  intact. No rash noted. Psychiatric: Mood and affect are depressed and tearful.  +SI w/ plan (OD).  ____________________________________________   LABS (all labs ordered are listed, but only abnormal results are displayed)  Labs Reviewed  COMPREHENSIVE METABOLIC PANEL - Abnormal; Notable for the following:       Result Value   Glucose, Bld 59 (*)    Total Protein 8.3 (*)    AST 44 (*)    ALT 73 (*)    All other components within normal limits  ACETAMINOPHEN LEVEL - Abnormal; Notable for the following:    Acetaminophen (Tylenol), Serum <10 (*)    All other components within normal limits  CBC WITH DIFFERENTIAL/PLATELET - Abnormal; Notable for the following:    RBC 5.91 (*)    Hemoglobin 18.9 (*)    HCT 54.6 (*)    Basophils Absolute 0.3 (*)    All other components within normal limits  ETHANOL  SALICYLATE  LEVEL  URINE DRUG SCREEN, QUALITATIVE ([facility] ONLY)   ____________________________________________  EKG   ____________________________________________  RADIOLOGY   No results found.  ____________________________________________   PROCEDURES  Procedure(s) performed:   Procedures   Critical Care performed: No ____________________________________________   INITIAL IMPRESSION / ASSESSMENT AND PLAN / ED COURSE  Pertinent labs & imaging results that were available during my care of the patient were reviewed by me and considered in my medical decision making (see chart for details).  Placed the patient under involuntary commitment given that he was severe enough that his act team dropped him off.  He confirms depression and suicidality with me.  I have ordered psychiatry and TTS consults.  No acute medical issues at this time.   Clinical Course  Comment By Time  I spoke in person with Dr. Weber Cooks who evaluated the patient and feels that the patient is safe to be discharged with outpatient follow-up.  The patient is hemodynamically stable and appropriate to go at  this time. Hinda Kehr, MD 10/25 1743    ____________________________________________  FINAL CLINICAL IMPRESSION(S) / ED DIAGNOSES  Final diagnoses:  Depression, unspecified depression type     MEDICATIONS GIVEN DURING THIS VISIT:  Medications - No data to display   NEW OUTPATIENT MEDICATIONS STARTED DURING THIS VISIT:  Discharge Medication List as of 07/08/2016  5:45 PM      Discharge Medication List as of 07/08/2016  5:45 PM      Discharge Medication List as of 07/08/2016  5:45 PM       Note:  This document was prepared using Dragon voice recognition software and may include unintentional dictation errors.    Hinda Kehr, MD 07/09/16 2101

## 2016-07-08 NOTE — Discharge Instructions (Signed)
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
## 2016-07-08 NOTE — BH Assessment (Signed)
Assessment Note  Mathew Aguilar is an 65 y.o. male who presents to the ER via his ACT Team due to voicing SI. Patient was recently discharged from [facility] due to having similar problem. Patient is known to the ER with frequent visits and similar complaint.  After patient was assigned a room in the ER and giving food, he denied SI. Patient is calm, cooperative and polite. He reports he feels he is able to return home. In the event he feels overwhelmed and or upset, he'll call his ACT Team Crisis number. Patient has done similar things in the past. After coming to the ER and settled down, he voiced he's able to discharged home with no concerns.   Patient currently denies SI/HI and AV/H.  Diagnosis: Depression  Past Medical History:  Past Medical History:  Diagnosis Date  . Depression   . Hypertension   . Restless leg     Past Surgical History:  Procedure Laterality Date  . AMPUTATION ARM      Family History:  Family History  Problem Relation Age of Onset  . Family history unknown: Yes    Social History:  reports that he has never smoked. He has never used smokeless tobacco. He reports that he does not drink alcohol or use drugs.  Additional Social History:  Alcohol / Drug Use Pain Medications: See PTA Prescriptions: See PTA Over the Counter: See PTA History of alcohol / drug use?: Yes Longest period of sobriety (when/how long): unknown Negative Consequences of Use: Personal relationships, Financial, Work / School Withdrawal Symptoms:  (Reports of none) Substance #1 Name of Substance 1: Alcohol 1 - Age of First Use: Teenager 1 - Amount (size/oz): Unable to quantify 1 - Frequency: Unable to quantify 1 - Duration: "Years" 1 - Last Use / Amount: Unknown  CIWA: CIWA-Ar BP: 139/81 Pulse Rate: 65 COWS:    Allergies: No Known Allergies  Home Medications:  (Not in a hospital admission)  OB/GYN Status:  No LMP for male patient.  General Assessment Data Location  of Assessment: [facility] ED TTS Assessment: In system Is this a Tele or Face-to-Face Assessment?: Face-to-Face Is this an Initial Assessment or a Re-assessment for this encounter?: Initial Assessment Marital status: Single Maiden name: n/a Is patient pregnant?: No Pregnancy Status: No Living Arrangements: Alone Can pt return to current living arrangement?: Yes Admission Status: Involuntary Is patient capable of signing voluntary admission?: Yes Referral Source: Self/Family/Friend Insurance type: [employer] MCR  Medical Screening Exam ([place]) Medical Exam completed: Yes  Crisis Care Plan Living Arrangements: Alone Legal Guardian: Other: (Reports of none) Name of Psychiatrist: ACTT Team Name of Therapist: ACTT Team  Education Status Is patient currently in school?: No Current Grade: n/a Highest grade of school patient has completed: Unknown Name of school: NA Contact person: NA  Risk to self with the past 6 months Suicidal Ideation: No-Not Currently/Within Last 6 Months Has patient been a risk to self within the past 6 months prior to admission? : No Suicidal Intent: No-Not Currently/Within Last 6 Months Has patient had any suicidal intent within the past 6 months prior to admission? : Yes Is patient at risk for suicide?: No Suicidal Plan?: No-Not Currently/Within Last 6 Months Has patient had any suicidal plan within the past 6 months prior to admission? : No Specify Current Suicidal Plan: None at this time Access to Means: No Specify Access to Suicidal Means: Reports of none What has been your use of drugs/alcohol within the last 12  months?: History of alcohol use Previous Attempts/Gestures: Yes How many times?:  (Multiple) Other Self Harm Risks: Active Addiction Triggers for Past Attempts: Unpredictable Intentional Self Injurious Behavior: None Family Suicide History: No Recent stressful life event(s): Recent negative physical changes, Loss (Comment), Conflict  (Comment) Persecutory voices/beliefs?: No Depression: Yes Depression Symptoms: Feeling worthless/self pity, Guilt, Fatigue, Isolating, Feeling angry/irritable Substance abuse history and/or treatment for substance abuse?: Yes Suicide prevention information given to non-admitted patients: Not applicable  Risk to Others within the past 6 months Homicidal Ideation: No Does patient have any lifetime risk of violence toward others beyond the six months prior to admission? : No Thoughts of Harm to Others: No Current Homicidal Intent: No Current Homicidal Plan: No Access to Homicidal Means: No Identified Victim: Reports of none History of harm to others?: No Violent Behavior Description: Reports of none Does patient have access to weapons?: No Criminal Charges Pending?: No Does patient have a court date: No Is patient on probation?: No  Psychosis Hallucinations: None noted Delusions: None noted  Mental Status Report Appearance/Hygiene: In scrubs, Unremarkable Eye Contact: Fair Motor Activity: Freedom of movement, Unremarkable Speech: Logical/coherent, Slow, Unremarkable Level of Consciousness: Alert Mood: Pleasant, Despair, Helpless, Guilty Affect: Appropriate to circumstance, Sad Anxiety Level: Minimal Thought Processes: Coherent, Relevant Judgement: Unimpaired Orientation: Person, Place, Time, Situation, Appropriate for developmental age Obsessive Compulsive Thoughts/Behaviors: Minimal  Cognitive Functioning Concentration: Normal Memory: Recent Intact, Remote Intact IQ: Average Insight: Fair Impulse Control: Fair Appetite: Good Weight Loss: 0 Weight Gain: 0 Sleep: No Change Total Hours of Sleep: 8 Vegetative Symptoms: None  ADLScreening [facility] Assessment Services) Patient's cognitive ability adequate to safely complete daily activities?: Yes Patient able to express need for assistance with ADLs?: Yes Independently performs ADLs?: Yes (appropriate for developmental  age)  Prior Inpatient Therapy Prior Inpatient Therapy: Yes Prior Therapy Dates: 06/2016, 03/2016, 02/2016, 08/2015, 05/2015 & 02/2015 Prior Therapy Facilty/Provider(s): [facility] Reason for Treatment: Depression  Prior Outpatient Therapy Prior Outpatient Therapy: Yes Prior Therapy Dates: Ongoing Prior Therapy Facilty/Provider(s): [facility]T Team ([facility]) Reason for Treatment: Depression Does patient have an ACCT team?: Yes Does patient have Intensive In-House Services?  : No Does patient have [facility] services? : No Does patient have P4CC services?: No  ADL Screening (condition at time of admission) Patient's cognitive ability adequate to safely complete daily activities?: Yes Is the patient deaf or have difficulty hearing?: No Does the patient have difficulty seeing, even when wearing glasses/contacts?: No Does the patient have difficulty concentrating, remembering, or making decisions?: No Patient able to express need for assistance with ADLs?: Yes Does the patient have difficulty dressing or bathing?: No Independently performs ADLs?: Yes (appropriate for developmental age) Does the patient have difficulty walking or climbing stairs?: No Weakness of Legs: None Weakness of Arms/Hands: None  Home Assistive Devices/Equipment Home Assistive Devices/Equipment: None  Therapy Consults (therapy consults require a physician order) PT Evaluation Needed: No OT Evalulation Needed: No SLP Evaluation Needed: No Abuse/Neglect Assessment (Assessment to be complete while patient is alone) Physical Abuse: Denies Verbal Abuse: Denies Sexual Abuse: Denies Exploitation of patient/patient's resources: Denies Self-Neglect: Denies Values / Beliefs Cultural Requests During Hospitalization: None Spiritual Requests During Hospitalization: None Consults Spiritual Care Consult Needed: No Social Work Consult Needed: No      Additional Information 1:1 In Past 12 Months?: No CIRT Risk:  No Elopement Risk: No Does patient have medical clearance?: Yes  Child/Adolescent Assessment Running Away Risk: Denies (Patient is an adult)  Disposition:  Disposition Initial  Assessment Completed for this Encounter: Yes Disposition of Patient: Other dispositions (ER MD ordered Psych Consult)  On Site Evaluation by:   Reviewed with Physician:    Gunnar Fusi MS, LCAS, LPC, [place], CCSI Therapeutic Triage Specialist 07/08/2016 6:10 PM

## 2016-07-08 NOTE — ED Notes (Signed)
Meal tray given to patient.

## 2016-08-25 DIAGNOSIS — F5104 Psychophysiologic insomnia: Secondary | ICD-10-CM | POA: Insufficient documentation

## 2016-11-30 DIAGNOSIS — E119 Type 2 diabetes mellitus without complications: Secondary | ICD-10-CM | POA: Insufficient documentation

## 2016-12-01 ENCOUNTER — Encounter: Payer: Self-pay | Admitting: Emergency Medicine

## 2016-12-01 ENCOUNTER — Emergency Department
Admission: EM | Admit: 2016-12-01 | Discharge: 2016-12-01 | Disposition: A | Discharge status: Home / Self Care | Payer: Medicare Other | Attending: Emergency Medicine | Admitting: Emergency Medicine

## 2016-12-01 DIAGNOSIS — Z79899 Other long term (current) drug therapy: Secondary | ICD-10-CM | POA: Insufficient documentation

## 2016-12-01 DIAGNOSIS — R451 Restlessness and agitation: Secondary | ICD-10-CM | POA: Insufficient documentation

## 2016-12-01 DIAGNOSIS — I10 Essential (primary) hypertension: Secondary | ICD-10-CM | POA: Insufficient documentation

## 2016-12-01 DIAGNOSIS — J302 Other seasonal allergic rhinitis: Secondary | ICD-10-CM | POA: Diagnosis not present

## 2016-12-01 DIAGNOSIS — R4589 Other symptoms and signs involving emotional state: Secondary | ICD-10-CM

## 2016-12-01 DIAGNOSIS — J449 Chronic obstructive pulmonary disease, unspecified: Secondary | ICD-10-CM | POA: Diagnosis not present

## 2016-12-01 DIAGNOSIS — R0981 Nasal congestion: Secondary | ICD-10-CM | POA: Diagnosis present

## 2016-12-01 MED ORDER — LORATADINE 10 MG PO TABS: 10.0000 mg | ORAL_TABLET | Freq: Every day | ORAL | 0 refills | Status: DC

## 2016-12-01 MED ORDER — FLUTICASONE PROPIONATE 50 MCG/ACT NA SUSP: 1.0000 | Freq: Every day | NASAL | 0 refills | Status: DC

## 2016-12-01 NOTE — ED Provider Notes (Signed)
[facility] Emergency Department Provider Note  ____________________________________________  Time seen: Approximately 7:35 AM  I have reviewed the triage vital signs and the nursing notes.   HISTORY  Chief Complaint Nasal Congestion    HPI NEALY KARAPETIAN is a 66 y.o. male with a history of seasonal allergies, depression, presenting for congestion and feeling fidgety. The patient reports that he has run out of his nasal spray and oral allergy medication, and since then he has congestion which is worse when he lays down at night and he feels that it makes it difficult for him to breathe. He has no difficulty breathing with exertion, lower extremity swelling, chest pain, palpitations, lightheadedness or syncope. No cough, fever, ear pain or sore throat. He also feels "like I can't be still."   Past Medical History:  Diagnosis Date  . Depression   . Hypertension   . Restless leg     Patient Active Problem List   Diagnosis Date Noted  . COPD (chronic obstructive pulmonary disease) ([place]) 04/03/2016  . Alcohol use disorder, mild, abuse 02/27/2016  . Severe recurrent major depression with psychotic features ([place]) 02/27/2016  . Suicidal ideation 02/03/2016  . GERD (gastroesophageal reflux disease) 02/19/2015  . Essential hypertension 02/18/2015  . Restless legs syndrome 02/18/2015    Past Surgical History:  Procedure Laterality Date  . AMPUTATION ARM      Current Outpatient Rx  . Order #: [id number] Class: Historical Med  . Order #: [id number] Class: Print  . Order #: [id number] Class: Print  . Order #: [id number] Class: Print  . Order #: [id number] Class: Historical Med  . Order #: [id number] Class: Historical Med  . Order #: [id number] Class: Print  . Order #: [id number] Class: Print  . Order #: [id number] Class: Print  . Order #: [id number] Class: Historical Med  . Order #: [id number] Class: Historical Med  . Order #: [id number] Class: Historical Med  . Order #:  [id number] Class: Historical Med  . Order #: [id number] Class: Historical Med  . Order #: [id number] Class: Print  . Order #: [id number] Class: Historical Med  . Order #: [id number] Class: Historical Med    Allergies Patient has no known allergies.  Family History  Problem Relation Age of Onset  . Family history unknown: Yes    Social History Social History  Substance Use Topics  . Smoking status: Never Smoker  . Smokeless tobacco: Never Used  . Alcohol use No     Comment: history of ETOH abuse - sober several months    Review of Systems Constitutional: No fever/chills.No diaphoresis. No lightheadedness or syncope. Eyes: No visual changes. No eye discharge. ENT: No sore throat. Positive congestion and postnasal drip. No facial pain or fullness. Cardiovascular: Denies chest pain. Denies palpitations. Respiratory: Positive shortness of breath. No exertional shortness of breath. No cough. Gastrointestinal: No abdominal pain.  No nausea, no vomiting.  No diarrhea.  No constipation. Genitourinary: Negative for dysuria. Musculoskeletal: Negative for back pain. Skin: Negative for rash. Neurological: Negative for headaches. No focal numbness, tingling or weakness.  Psychiatric:Positive difficulty sitting still. Feels like he needs to move around a lot.  10-point ROS otherwise negative.  ____________________________________________   PHYSICAL EXAM:  VITAL SIGNS: ED Triage Vitals [12/01/16 0612]  Enc Vitals Group     BP 139/65     Pulse Rate 91     Resp 18     Temp 97.8 F (36.6 C)     Temp Source Oral     SpO2 98 %     Weight 160 lb (72.6 kg)  Height 5' (1.524 m)     Head Circumference      Peak Flow      Pain Score      Pain Loc      Pain Edu?      Excl. in [place]?     Constitutional: Alert and oriented. Well appearing and in no acute distress. Answers questions appropriately. Eyes: Conjunctivae are normal.  EOMI. No scleral icterus. No eye discharge. Head:  Atraumatic. Nose: No congestion/rhinnorhea. No facial fullness or tenderness over the frontal or maxillary sinuses. Mouth/Throat: Mucous membranes are moist.  Neck: No stridor.  Supple.  No JVD. Cardiovascular: Normal rate, regular rhythm. No murmurs, rubs or gallops.  Respiratory: Normal respiratory effort.  No accessory muscle use or retractions. Lungs CTAB.  No wheezes, rales or ronchi. Gastrointestinal: Overweight. Soft, nontender and nondistended.  No guarding or rebound.  No peritoneal signs. Musculoskeletal: No LE edema. No ttp in the calves or palpable cords.  Negative Homan's sign. Neurologic:  A&Ox3.  Speech is clear.  Face and smile are symmetric.  EOMI.  Moves all extremities well. Skin:  Skin is warm, dry and intact. No rash noted. Psychiatric: Mood and affect are normal. Patient initially walking around the room, but able to sit and concentrate during my history and physical examination.  ____________________________________________   LABS (all labs ordered are listed, but only abnormal results are displayed)  Labs Reviewed - No data to display ____________________________________________  EKG  Not indicated ____________________________________________  RADIOLOGY  No results found.  ____________________________________________   PROCEDURES  Procedure(s) performed: None  Procedures  Critical Care performed: No ____________________________________________   INITIAL IMPRESSION / ASSESSMENT AND PLAN / ED COURSE  Pertinent labs & imaging results that were available during my care of the patient were reviewed by me and considered in my medical decision making (see chart for details).  66 y.o. male with a history of seasonal allergies who is out of his medications presenting with congestion which is worse with lying down. On my examination, the patient has stable vital signs and no evidence of cardiopulmonary abnormalities. I do not think he has pneumonia or an  acute infectious process. I will plan to refill his Flonase and Claritin and have him follow-up with his primary care physician. In terms of his sensation of feeling fidgety, I will also have him talk to his primary care physician about this.  ____________________________________________  FINAL CLINICAL IMPRESSION(S) / ED DIAGNOSES  Final diagnoses:  Acute seasonal allergic rhinitis, unspecified trigger  Congestion of nasal sinus  Fidgeting         NEW MEDICATIONS STARTED DURING THIS VISIT:  Discharge Medication List as of 12/01/2016  7:35 AM    START taking these medications   Details  fluticasone (FLONASE) 50 MCG/ACT nasal spray Place 1 spray into both nostrils daily., Starting Tue 12/01/2016, Until Wed 12/01/2017, Print          Eula Listen, MD 12/01/16 [phone]

## 2016-12-01 NOTE — ED Triage Notes (Signed)
Pt in with congestion and coughing for 2 days, denies any pain. No shob noted at this time, states just feels "stuffy".

## 2016-12-01 NOTE — Discharge Instructions (Signed)
Make an appointment with your primary care physician for reevaluation. Return to the emergency department if you develop chest pain, shortness of breath, fever, or any other symptoms concerning to you.

## 2016-12-08 ENCOUNTER — Emergency Department
Admission: EM | Admit: 2016-12-08 | Discharge: 2016-12-09 | Disposition: A | Discharge status: Home / Self Care | Payer: Medicare Other | Attending: Student in an Organized Health Care Education/Training Program | Admitting: Student in an Organized Health Care Education/Training Program

## 2016-12-08 ENCOUNTER — Encounter: Payer: Self-pay | Admitting: Emergency Medicine

## 2016-12-08 DIAGNOSIS — F329 Major depressive disorder, single episode, unspecified: Secondary | ICD-10-CM | POA: Insufficient documentation

## 2016-12-08 DIAGNOSIS — F333 Major depressive disorder, recurrent, severe with psychotic symptoms: Secondary | ICD-10-CM | POA: Diagnosis present

## 2016-12-08 DIAGNOSIS — Z79899 Other long term (current) drug therapy: Secondary | ICD-10-CM | POA: Diagnosis not present

## 2016-12-08 DIAGNOSIS — Z8659 Personal history of other mental and behavioral disorders: Secondary | ICD-10-CM | POA: Diagnosis not present

## 2016-12-08 DIAGNOSIS — R45851 Suicidal ideations: Secondary | ICD-10-CM

## 2016-12-08 LAB — CBC
HEMATOCRIT: 51.8 % (ref 40.0–52.0)
Hemoglobin: 18 g/dL (ref 13.0–18.0)
MCH: 31.8 pg (ref 26.0–34.0)
MCHC: 34.7 g/dL (ref 32.0–36.0)
MCV: 91.6 fL (ref 80.0–100.0)
PLATELETS: 215 10*3/uL (ref 150–440)
RBC: 5.65 MIL/uL (ref 4.40–5.90)
RDW: 13.4 % (ref 11.5–14.5)
WBC: 4.8 10*3/uL (ref 3.8–10.6)

## 2016-12-08 LAB — COMPREHENSIVE METABOLIC PANEL
ALBUMIN: 4.1 g/dL (ref 3.5–5.0)
ALK PHOS: 42 U/L (ref 38–126)
ALT: 44 U/L (ref 17–63)
AST: 32 U/L (ref 15–41)
Anion gap: 7 (ref 5–15)
BUN: 9 mg/dL (ref 6–20)
CALCIUM: 9.2 mg/dL (ref 8.9–10.3)
CO2: 26 mmol/L (ref 22–32)
Chloride: 104 mmol/L (ref 101–111)
Creatinine, Ser: 0.67 mg/dL (ref 0.61–1.24)
Glucose, Bld: 72 mg/dL (ref 65–99)
POTASSIUM: 4 mmol/L (ref 3.5–5.1)
Sodium: 137 mmol/L (ref 135–145)
TOTAL PROTEIN: 7.4 g/dL (ref 6.5–8.1)
Total Bilirubin: 1.4 mg/dL — ABNORMAL HIGH (ref 0.3–1.2)

## 2016-12-08 LAB — SALICYLATE LEVEL

## 2016-12-08 LAB — URINE DRUG SCREEN, QUALITATIVE (ARMC ONLY)
AMPHETAMINES, UR SCREEN: NOT DETECTED
BARBITURATES, UR SCREEN: NOT DETECTED
BENZODIAZEPINE, UR SCRN: NOT DETECTED
Cannabinoid 50 Ng, Ur ~~LOC~~: NOT DETECTED
Cocaine Metabolite,Ur ~~LOC~~: NOT DETECTED
MDMA (Ecstasy)Ur Screen: NOT DETECTED
METHADONE SCREEN, URINE: NOT DETECTED
Opiate, Ur Screen: NOT DETECTED
Phencyclidine (PCP) Ur S: NOT DETECTED
Tricyclic, Ur Screen: NOT DETECTED

## 2016-12-08 LAB — ACETAMINOPHEN LEVEL

## 2016-12-08 LAB — ETHANOL

## 2016-12-08 MED ORDER — LEVOTHYROXINE SODIUM 75 MCG PO TABS
75.0000 ug | ORAL_TABLET | Freq: Every day | ORAL | Status: DC
  Administered 2016-12-09: 75 ug via ORAL
  Filled 2016-12-08: qty 1

## 2016-12-08 MED ORDER — METFORMIN HCL 500 MG PO TABS
500.0000 mg | ORAL_TABLET | Freq: Every day | ORAL | Status: DC
  Administered 2016-12-09: 500 mg via ORAL
  Filled 2016-12-08: qty 1

## 2016-12-08 MED ORDER — DULOXETINE HCL 60 MG PO CPEP
60.0000 mg | ORAL_CAPSULE | Freq: Two times a day (BID) | ORAL | Status: DC
  Administered 2016-12-09: 60 mg via ORAL
  Filled 2016-12-08: qty 1

## 2016-12-08 MED ORDER — LAMOTRIGINE 25 MG PO TABS
50.0000 mg | ORAL_TABLET | Freq: Every day | ORAL | Status: DC
  Administered 2016-12-09: 50 mg via ORAL
  Filled 2016-12-08: qty 2

## 2016-12-08 MED ORDER — FLUTICASONE PROPIONATE 50 MCG/ACT NA SUSP
1.0000 | Freq: Every day | NASAL | Status: DC
  Administered 2016-12-09: 1 via NASAL
  Filled 2016-12-08 (×2): qty 16

## 2016-12-08 MED ORDER — LORATADINE 10 MG PO TABS
10.0000 mg | ORAL_TABLET | Freq: Every day | ORAL | Status: DC
  Administered 2016-12-09: 10 mg via ORAL
  Filled 2016-12-08: qty 1

## 2016-12-08 MED ORDER — HYDROCHLOROTHIAZIDE 12.5 MG PO CAPS
12.5000 mg | ORAL_CAPSULE | Freq: Every day | ORAL | Status: DC
  Filled 2016-12-08: qty 1

## 2016-12-08 MED ORDER — SERTRALINE HCL 50 MG PO TABS: 50.0000 mg | ORAL_TABLET | Freq: Every day | ORAL | Status: DC

## 2016-12-08 MED ORDER — ATENOLOL 25 MG PO TABS: 25.0000 mg | ORAL_TABLET | Freq: Every day | ORAL | Status: DC

## 2016-12-08 MED ORDER — PANTOPRAZOLE SODIUM 40 MG PO TBEC
40.0000 mg | DELAYED_RELEASE_TABLET | Freq: Every day | ORAL | Status: DC
  Administered 2016-12-09: 40 mg via ORAL
  Filled 2016-12-08: qty 1

## 2016-12-08 MED ORDER — ZIPRASIDONE MESYLATE 20 MG IM SOLR
20.0000 mg | Freq: Once | INTRAMUSCULAR | Status: AC
  Administered 2016-12-08: 20 mg via INTRAMUSCULAR
  Filled 2016-12-08: qty 20

## 2016-12-08 MED ORDER — HYDROCHLOROTHIAZIDE 12.5 MG PO CAPS: 12.5000 mg | ORAL_CAPSULE | Freq: Every day | ORAL | Status: DC

## 2016-12-08 MED ORDER — ROPINIROLE HCL 1 MG PO TABS
6.0000 mg | ORAL_TABLET | Freq: Every day | ORAL | Status: DC
  Filled 2016-12-08: qty 6

## 2016-12-08 MED ORDER — QUETIAPINE FUMARATE 25 MG PO TABS
150.0000 mg | ORAL_TABLET | Freq: Every day | ORAL | Status: DC
Start: 2016-12-08 — End: 2016-12-09

## 2016-12-08 MED ORDER — TRAZODONE HCL 100 MG PO TABS
200.0000 mg | ORAL_TABLET | Freq: Every day | ORAL | Status: DC
  Administered 2016-12-08: 200 mg via ORAL
  Filled 2016-12-08: qty 2

## 2016-12-08 MED ORDER — HALOPERIDOL LACTATE 5 MG/ML IJ SOLN
10.0000 mg | Freq: Once | INTRAMUSCULAR | Status: AC
  Administered 2016-12-08: 10 mg via INTRAMUSCULAR
  Filled 2016-12-08: qty 2

## 2016-12-08 MED ORDER — METFORMIN HCL 500 MG PO TABS: 500.0000 mg | ORAL_TABLET | Freq: Every day | ORAL | Status: DC

## 2016-12-08 MED ORDER — SERTRALINE HCL 50 MG PO TABS
50.0000 mg | ORAL_TABLET | Freq: Every day | ORAL | Status: DC
  Administered 2016-12-09: 50 mg via ORAL
  Filled 2016-12-08: qty 1

## 2016-12-08 MED ORDER — DIPHENHYDRAMINE HCL 25 MG PO CAPS
25.0000 mg | ORAL_CAPSULE | Freq: Once | ORAL | Status: AC
  Administered 2016-12-08: 25 mg via ORAL
  Filled 2016-12-08: qty 1

## 2016-12-08 MED ORDER — ATENOLOL 25 MG PO TABS
25.0000 mg | ORAL_TABLET | Freq: Every day | ORAL | Status: DC
  Filled 2016-12-08: qty 1

## 2016-12-08 MED ORDER — LEVOCETIRIZINE DIHYDROCHLORIDE 5 MG PO TABS
5.0000 mg | ORAL_TABLET | Freq: Every evening | ORAL | Status: DC
  Filled 2016-12-08: qty 1

## 2016-12-08 NOTE — ED Triage Notes (Signed)
Patient presents to the ED with report of feeling suicidal.  Patient is seen by the ACT team and told his ACT team member he was feeling suicidal and the ACT team called the police.  Patient came to the hospital to be seen by police voluntarily.  Patient denies specific plan to kill himself.  Reports feeling suicidal, "off and on for a long time."  Denies visual or auditory hallucinations.

## 2016-12-08 NOTE — ED Notes (Signed)
IVC/Called to coordinate [facility] consult due to issues with cart not completed at this time

## 2016-12-08 NOTE — ED Notes (Signed)
[facility] called to consider patient for transfer to [facility].

## 2016-12-08 NOTE — ED Notes (Signed)
Patient was calm upon this RN leaving his room for assessment, but became upset when speaking to the MD. Patient advised of his IVC status. Patient states he is leaving and will not take medication. Patient eventually was willing to accept injection when situation explained and patient aware of what medication was. IM injection given without any incidents.

## 2016-12-08 NOTE — ED Notes (Signed)
Brought pt a dinner tray with grilled chicken sandwhich, fruit, fries pt began to get angry and refused tray

## 2016-12-08 NOTE — ED Notes (Signed)
Patient tried to run out of the behavioral quad area. Dr. Quentin Cornwall aware and Geodon ordered and administered.

## 2016-12-08 NOTE — BH Assessment (Signed)
Assessment Note  Mathew Aguilar is an 66 y.o. male who presents to the ER due to his ACT Team, petitioning for him to be under IVC. Per the report of the ACT Team Member, the patient has stop taking all his medications. He is easily agitated and irritable. ACTT Member also reports, the patient was voicing SI with no plan.  Per the report of the patient, he's been depressed for several days. His sleep has decreased. He's having trouble falling and staying asleep. He also reports of having a lack of appetite. He admits to having SI but when asked for a plan, he stated he did not know.   During the interview, the patient was calm, cooperative and pleasant. He spoke softly and at times, Probation officer was unable to understand what he was saying. Patient is well known to the ER, due to similar presentations in the past. He is also known to have serious suicide attempts and can be unpredictable, in regards to self-harm. Patient denies current involvement with the legal system and with DSS. He denies current use of any mind-altering substances.  Diagnosis: Depression  Past Medical History:  Past Medical History:  Diagnosis Date  . Depression   . Hypertension   . Restless leg     Past Surgical History:  Procedure Laterality Date  . AMPUTATION ARM      Family History:  Family History  Problem Relation Age of Onset  . Family history unknown: Yes    Social History:  reports that he has never smoked. He has never used smokeless tobacco. He reports that he does not drink alcohol or use drugs.  Additional Social History:  Alcohol / Drug Use Pain Medications: See PTA Prescriptions: See PTA Over the Counter: See PTA History of alcohol / drug use?: Yes Longest period of sobriety (when/how long): unknown, Patient unable to Quantify Negative Consequences of Use: Personal relationships, Financial, Work / School Withdrawal Symptoms:  (n/a)  CIWA: CIWA-Ar BP: (!) 146/79 Pulse Rate: 72 COWS:     Allergies: No Known Allergies  Home Medications:  (Not in a hospital admission)  OB/GYN Status:  No LMP for male patient.  General Assessment Data Location of Assessment: [facility] ED TTS Assessment: In system Is this a Tele or Face-to-Face Assessment?: Face-to-Face Is this an Initial Assessment or a Re-assessment for this encounter?: Initial Assessment Marital status: Single Maiden name: n/a Is patient pregnant?: No Pregnancy Status: No Living Arrangements: Alone Can pt return to current living arrangement?: Yes Admission Status: Involuntary Is patient capable of signing voluntary admission?: No (Under IVC) Referral Source: Other (ACT TEAM) Insurance type: U[employer] MCR & MCD  Medical Screening Exam ([place]) Medical Exam completed: Yes  Crisis Care Plan Living Arrangements: Alone Legal Guardian: Other: (Self) Name of Psychiatrist: [facility] Name of Therapist: [facility]  Education Status Is patient currently in school?: No Current Grade: n/a Highest grade of school patient has completed: n/a Name of school: n/a Contact person: n/a  Risk to self with the past 6 months Suicidal Ideation: No-Not Currently/Within Last 6 Months Has patient been a risk to self within the past 6 months prior to admission? : No Suicidal Intent: No-Not Currently/Within Last 6 Months Has patient had any suicidal intent within the past 6 months prior to admission? : No Is patient at risk for suicide?: No Suicidal Plan?: No Substance abuse history and/or treatment for substance abuse?: No  Risk to Others within the past 6 months Homicidal Ideation: No  Does patient have any lifetime risk of violence toward others beyond the six months prior to admission? : No Thoughts of Harm to Others: No Current Homicidal Intent: No Current Homicidal Plan: No Access to Homicidal Means: No Identified Victim: Reports of none History of harm to others?: No Assessment of Violence: None  Noted Violent Behavior Description: Reports of none Does patient have access to weapons?: No Criminal Charges Pending?: No Does patient have a court date: No Is patient on probation?: No  Psychosis Hallucinations: None noted Delusions: None noted  Mental Status Report Appearance/Hygiene: Unremarkable, In scrubs Eye Contact: Fair Motor Activity: Freedom of movement, Unremarkable Speech: Logical/coherent, Unremarkable Level of Consciousness: Alert Mood: Depressed, Anxious, Helpless, Sad, Pleasant Affect: Appropriate to circumstance, Sad, Anxious, Depressed Anxiety Level: Minimal Thought Processes: Coherent, Relevant Judgement: Unimpaired Orientation: Person, Place, Time, Situation, Appropriate for developmental age Obsessive Compulsive Thoughts/Behaviors: Minimal  Cognitive Functioning Concentration: Normal Memory: Recent Intact, Remote Intact IQ: Average Insight: Fair Impulse Control: Fair Appetite: Fair Weight Loss: 0 Weight Gain: 0 Sleep: Decreased Total Hours of Sleep: 5 Vegetative Symptoms: None  ADLScreening [facility]) Patient's cognitive ability adequate to safely complete daily activities?: Yes Patient able to express need for assistance with ADLs?: Yes Independently performs ADLs?: Yes (appropriate for developmental age)  Prior Inpatient Therapy Prior Inpatient Therapy: Yes Prior Therapy Dates: Multiple Psychiatric Hospitalization  Prior Therapy Facilty/Provider(s): Multiple Psychiatric Hospitalization  Reason for Treatment: Multiple Psychiatric Hospitalization   Prior Outpatient Therapy Prior Outpatient Therapy: Yes Prior Therapy Dates: Current Prior Therapy Facilty/Provider(s): [facility] Reason for Treatment: Major Depression Does patient have an ACCT team?: Yes Does patient have Intensive In-House Services?  : No Does patient have [facility] services? : No Does patient have P4CC services?: No  ADL Screening (condition at time of  admission) Patient's cognitive ability adequate to safely complete daily activities?: Yes Is the patient deaf or have difficulty hearing?: No Does the patient have difficulty seeing, even when wearing glasses/contacts?: No Does the patient have difficulty concentrating, remembering, or making decisions?: No Patient able to express need for assistance with ADLs?: Yes Does the patient have difficulty dressing or bathing?: No Independently performs ADLs?: Yes (appropriate for developmental age) Does the patient have difficulty walking or climbing stairs?: No Weakness of Legs: None Weakness of Arms/Hands: None  Home Assistive Devices/Equipment Home Assistive Devices/Equipment: None  Therapy Consults (therapy consults require a physician order) PT Evaluation Needed: No OT Evalulation Needed: No SLP Evaluation Needed: No Abuse/Neglect Assessment (Assessment to be complete while patient is alone) Physical Abuse: Denies Verbal Abuse: Denies Sexual Abuse: Denies Exploitation of patient/patient's resources: Denies Self-Neglect: Denies Values / Beliefs Cultural Requests During Hospitalization: None Spiritual Requests During Hospitalization: None Consults Spiritual Care Consult Needed: No Social Work Consult Needed: No Regulatory affairs officer (For Healthcare) Does Patient Have a Medical Advance Directive?: No Would patient like information on creating a medical advance directive?: No - Patient declined    Additional Information CIRT Risk: No Elopement Risk: No Does patient have medical clearance?: Yes  Child/Adolescent Assessment Running Away Risk: Denies (Patient is an adult)  Disposition:  Disposition Initial Assessment Completed for this Encounter: Yes Disposition of Patient: Other dispositions (ER MD Ordered Psych Consult)  On Site Evaluation by:   Reviewed with Physician:    Gunnar Fusi MS, LCAS, LPC, [place], CCSI Therapeutic Triage Specialist 12/08/2016 7:17 PM

## 2016-12-08 NOTE — ED Notes (Signed)
Patient states his restless legs is really bothering him and he can't lay still. EDP aware, orders obtained and medication given.

## 2016-12-08 NOTE — ED Notes (Signed)
Per BHU patient has to be cleared cardiac wise due to medications given earlier. Patient needs frequent bp checks and ECG. Will complete. EDP aware

## 2016-12-08 NOTE — ED Notes (Signed)
Spoke with EDP aware we was unable to get ECG. Per EDP patient is clear from his standpoint. [facility] notified. Bring to room 1.

## 2016-12-08 NOTE — ED Notes (Signed)
Patient is pacing in hallway and room. Even after medications to help with his restless legs. Still unable to obtain ECG due to restlessness.

## 2016-12-08 NOTE — ED Notes (Signed)
Unable to obtain EKG at this time. Patient unable to lay still. RN notified given medicine to help relax. Will retry again once he's calmed down.

## 2016-12-08 NOTE — ED Notes (Signed)
Pt pacing back and forth in room. Getting in and out of bed, stating "I need something to help me sleep, she needs to hurry up". RN informed.

## 2016-12-08 NOTE — ED Notes (Signed)
Patient given a new pair of brown socks. Old pair thrown away in the trash can.

## 2016-12-08 NOTE — ED Notes (Signed)
Patient given a meal tray and drink.

## 2016-12-08 NOTE — ED Provider Notes (Signed)
[facility] Emergency Department Provider Note    First MD Initiated Contact with Patient 12/08/16 1625     (approximate)  I have reviewed the triage vital signs and the nursing notes.   HISTORY  Chief Complaint Suicidal    HPI Mathew Aguilar is a 66 y.o. male presents initially as a voluntary at the recommendation of his crisis team member after telling them that he's been having thoughts of suicide. Does not have a plan. States is been feeling suicidal off and on for "a long time. Denies any visual or auditory hallucinations. States that he's stopped taking his medications 3 weeks ago and his depression has been getting worse. Having worsening symptoms of hopelessness.  Poor sleep. He is less interactive with neighbors.   During my interview the patient the patient suddenly became very agitated making threatening statements saying "aint no one gonna make me take my medications you son of a bitch." Patient then start walking towards me and aggressive manner. After redirection the patient demanded is closed and states he wanted to leave. Patient refused to complete interview. Due to his agitated behavior and uncooperative nature with previous history of suicide attempts not on his medication patient was made an IVC for psychiatric evaluation.   Past Medical History:  Diagnosis Date  . Depression   . Hypertension   . Restless leg    Family History  Problem Relation Age of Onset  . Family history unknown: Yes   Past Surgical History:  Procedure Laterality Date  . AMPUTATION ARM     Patient Active Problem List   Diagnosis Date Noted  . COPD (chronic obstructive pulmonary disease) ([place]) 04/03/2016  . Alcohol use disorder, mild, abuse 02/27/2016  . Severe recurrent major depression with psychotic features ([place]) 02/27/2016  . Suicidal ideation 02/03/2016  . GERD (gastroesophageal reflux disease) 02/19/2015  . Essential hypertension 02/18/2015  .  Restless legs syndrome 02/18/2015      Prior to Admission medications   Medication Sig Start Date End Date Taking? Authorizing Provider  atenolol (TENORMIN) 25 MG tablet Take 25 mg by mouth daily.    Historical Provider, MD  DULoxetine (CYMBALTA) 60 MG capsule Take 2 capsules (120 mg total) by mouth daily. Patient taking differently: Take 60 mg by mouth 2 (two) times daily.  04/06/16   [name], MD  fluticasone (FLONASE) 50 MCG/ACT nasal spray Place 1 spray into both nostrils daily. 12/01/16 12/01/17  Anne-Caroline Mariea Clonts, MD  hydrochlorothiazide (MICROZIDE) 12.5 MG capsule Take 1 capsule (12.5 mg total) by mouth daily. 04/06/16   [name], MD  lamoTRIgine (LAMICTAL) 25 MG tablet Take 50 mg by mouth daily.    Historical Provider, MD  levocetirizine (XYZAL) 5 MG tablet Take 5 mg by mouth every evening.    Historical Provider, MD  levothyroxine (SYNTHROID, LEVOTHROID) 75 MCG tablet Take 1 tablet (75 mcg total) by mouth daily before breakfast. 04/06/16   [name], MD  loratadine (CLARITIN) 10 MG tablet Take 1 tablet (10 mg total) by mouth daily. 12/01/16   Eula Listen, MD  metFORMIN (GLUCOPHAGE) 500 MG tablet Take 1 tablet (500 mg total) by mouth daily. 04/06/16   [name], MD  naltrexone (DEPADE) 50 MG tablet Take 50 mg by mouth daily.  02/24/16   Historical Provider, MD  omega-3 acid ethyl esters (LOVAZA) 1 g capsule Take 1 g by mouth daily.    Historical Provider, MD  omeprazole (PRILOSEC) 20 MG capsule Take 20 mg by mouth daily.  Historical Provider, MD  pantoprazole (PROTONIX) 40 MG tablet Take 40 mg by mouth daily.    Historical Provider, MD  QUEtiapine (SEROQUEL) 100 MG tablet Take 150 mg by mouth at bedtime.     Historical Provider, MD  rOPINIRole (REQUIP) 3 MG tablet Take 1 tablet (3 mg total) by mouth at bedtime. Patient taking differently: Take 6 mg by mouth at bedtime.  04/06/16   [name], MD    sertraline (ZOLOFT) 50 MG tablet Take 50 mg by mouth daily.    Historical Provider, MD  traZODone (DESYREL) 100 MG tablet Take 200 mg by mouth at bedtime.    Historical Provider, MD    Allergies Patient has no known allergies.    Social History Social History  Substance Use Topics  . Smoking status: Never Smoker  . Smokeless tobacco: Never Used  . Alcohol use No     Comment: history of ETOH abuse - sober several months    Review of Systems Patient denies headaches, rhinorrhea, blurry vision, numbness, shortness of breath, chest pain, edema, cough, abdominal pain, nausea, vomiting, diarrhea, dysuria, fevers, rashes or hallucinations unless otherwise stated above in HPI. ____________________________________________   PHYSICAL EXAM:  VITAL SIGNS: Vitals:   12/08/16 1500  BP: (!) 146/79  Pulse: 72  Resp: 20  Temp: 97.7 F (36.5 C)    Constitutional: Alert and in no acute distress. Eyes: Conjunctivae are normal. PERRL. EOMI. Head: Atraumatic. Nose: No congestion/rhinnorhea. Mouth/Throat: Mucous membranes are moist.  Oropharynx non-erythematous. Neck: No stridor. Painless ROM. No cervical spine tenderness to palpation Hematological/Lymphatic/Immunilogical: No cervical lymphadenopathy. Cardiovascular: Normal rate, regular rhythm. Grossly normal heart sounds.  Good peripheral circulation. Respiratory: Normal respiratory effort.  No retractions. Lungs CTAB. Gastrointestinal: Soft and nontender. No distention. No abdominal bruits. No CVA tenderness. Genitourinary:  Musculoskeletal:  No lower extremity tenderness nor edema.  No joint effusions. Neurologic:  Normal speech and language. No gross focal neurologic deficits are appreciated. No gait instability. Skin:  Skin is warm, dry and intact. No rash noted. Psychiatric: Mood is depressed,  Volatile, easily agitated. Very impulsive behavior  ____________________________________________   LABS (all labs ordered are listed,  but only abnormal results are displayed)  Results for orders placed or performed during the hospital encounter of 12/08/16 (from the past 24 hour(s))  Comprehensive metabolic panel     Status: Abnormal   Collection Time: 12/08/16  3:00 PM  Result Value Ref Range   Sodium 137 135 - 145 mmol/L   Potassium 4.0 3.5 - 5.1 mmol/L   Chloride 104 101 - 111 mmol/L   CO2 26 22 - 32 mmol/L   Glucose, Bld 72 65 - 99 mg/dL   BUN 9 6 - 20 mg/dL   Creatinine, Ser 0.67 0.61 - 1.24 mg/dL   Calcium 9.2 8.9 - 10.3 mg/dL   Total Protein 7.4 6.5 - 8.1 g/dL   Albumin 4.1 3.5 - 5.0 g/dL   AST 32 15 - 41 U/L   ALT 44 17 - 63 U/L   Alkaline Phosphatase 42 38 - 126 U/L   Total Bilirubin 1.4 (H) 0.3 - 1.2 mg/dL   GFR calc non Af Amer >60 >60 mL/min   GFR calc Af Amer >60 >60 mL/min   Anion gap 7 5 - 15  Ethanol     Status: None   Collection Time: 12/08/16  3:00 PM  Result Value Ref Range   Alcohol, Ethyl (B) <5 <5 mg/dL  Salicylate level     Status: None  Collection Time: 12/08/16  3:00 PM  Result Value Ref Range   Salicylate Lvl <3.6 2.8 - 30.0 mg/dL  Acetaminophen level     Status: Abnormal   Collection Time: 12/08/16  3:00 PM  Result Value Ref Range   Acetaminophen (Tylenol), Serum <10 (L) 10 - 30 ug/mL  cbc     Status: None   Collection Time: 12/08/16  3:00 PM  Result Value Ref Range   WBC 4.8 3.8 - 10.6 K/uL   RBC 5.65 4.40 - 5.90 MIL/uL   Hemoglobin 18.0 13.0 - 18.0 g/dL   HCT 51.8 40.0 - 52.0 %   MCV 91.6 80.0 - 100.0 fL   MCH 31.8 26.0 - 34.0 pg   MCHC 34.7 32.0 - 36.0 g/dL   RDW 13.4 11.5 - 14.5 %   Platelets 215 150 - 440 K/uL  Urine Drug Screen, Qualitative     Status: None   Collection Time: 12/08/16  3:00 PM  Result Value Ref Range   Tricyclic, Ur Screen NONE DETECTED NONE DETECTED   Amphetamines, Ur Screen NONE DETECTED NONE DETECTED   MDMA (Ecstasy)Ur Screen NONE DETECTED NONE DETECTED   Cocaine Metabolite,Ur [place] NONE DETECTED NONE DETECTED   Opiate, Ur Screen NONE DETECTED  NONE DETECTED   Phencyclidine (PCP) Ur S NONE DETECTED NONE DETECTED   Cannabinoid 50 Ng, Ur [place] NONE DETECTED NONE DETECTED   Barbiturates, Ur Screen NONE DETECTED NONE DETECTED   Benzodiazepine, Ur Scrn NONE DETECTED NONE DETECTED   Methadone Scn, Ur NONE DETECTED NONE DETECTED   ____________________________________________  EKG____________________________________________  RADIOLOGY   ____________________________________________   PROCEDURES  Procedure(s) performed:  Procedures    Critical Care performed: no ____________________________________________   INITIAL IMPRESSION / ASSESSMENT AND PLAN / ED COURSE  Pertinent labs & imaging results that were available during my care of the patient were reviewed by me and considered in my medical decision making (see chart for details).  DDX: Psychosis, delirium, medication effect, noncompliance, polysubstance abuse, Si, Hi, depression   Mathew Aguilar is a 66 y.o. who presents to the ED with for evaluation of SI aggressive behavior.  Patient has psych history of suicide attempt.  Laboratory testing was ordered to evaluation for underlying electrolyte derangement or signs of underlying organic pathology to explain today's presentation.  Based on history and physical and laboratory evaluation, it appears that the patient's presentation is 2/2 underlying psychiatric disorder and will require further evaluation and management by inpatient psychiatry.  Patient was made an IVC due to Si and agitation with impulsive behavior.  Disposition pending psychiatric evaluation.   Clinical Course as of Dec 08 2333  Tue Dec 08, 2016  1758 Patient became more agitated and tried to elope and run from security. He was able to be stopped.  Provide additional calming agent in the form of Geodon.  [PR]    Clinical Course User Index [PR] Merlyn Lot, MD     ____________________________________________   FINAL CLINICAL IMPRESSION(S) / ED  DIAGNOSES  Final diagnoses:  Suicidal ideation  History of impulsive behavior      NEW MEDICATIONS STARTED DURING THIS VISIT:  New Prescriptions   No medications on file     Note:  This document was prepared using Dragon voice recognition software and may include unintentional dictation errors.    Merlyn Lot, MD 12/08/16 [phone]

## 2016-12-09 DIAGNOSIS — F333 Major depressive disorder, recurrent, severe with psychotic symptoms: Secondary | ICD-10-CM | POA: Diagnosis not present

## 2016-12-09 DIAGNOSIS — F329 Major depressive disorder, single episode, unspecified: Secondary | ICD-10-CM | POA: Diagnosis not present

## 2016-12-09 NOTE — ED Notes (Addendum)
Breakfast brought to patient. Pt is calm and cooperative at this time denying SI. Pt rates his depression a 5/10. Pt encouraged to ask questions and voice concerns. Safety maintained with 15 minute checks

## 2016-12-09 NOTE — ED Notes (Signed)
Lunch brought to patient 

## 2016-12-09 NOTE — ED Notes (Signed)
Patient given new scrubs, socks, towels and toiletries and is taking shower

## 2016-12-09 NOTE — ED Notes (Signed)
Pt awake and offers no c/o ,denies si he wants to go home

## 2016-12-09 NOTE — ED Notes (Signed)
Pt discharged to lobby. ACT team member here to pick up pt. Pt was stable and appreciative at that time. All papers  were given and valuables returned. Verbal understanding expressed. Denies SI/HI and A/VH. Pt given opportunity to express concerns and ask questions.

## 2016-12-09 NOTE — ED Provider Notes (Signed)
-----------------------------------------   1:53 AM on 12/09/2016 -----------------------------------------   Blood pressure 129/83, pulse 84, temperature 97.7 F (36.5 C), temperature source Oral, resp. rate 20, height 5' (1.524 m), weight 160 lb (72.6 kg), SpO2 96 %.  The patient had no acute events since last update.  Calm and cooperative at this time.  Disposition is pending Psychiatry/Behavioral Medicine team recommendations.     Daymon Larsen, MD 12/09/16 [phone]
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
## 2016-12-09 NOTE — Discharge Instructions (Signed)
You have been seen in the emergency department for a  psychiatric concern. You have been evaluated both medically as well as psychiatrically. Please follow-up with your outpatient resources provided. Return to the emergency department for any worsening symptoms, or any thoughts of hurting yourself or anyone else so that we may attempt to help you. 

## 2016-12-09 NOTE — ED Provider Notes (Signed)
-----------------------------------------   2:37 PM on 12/09/2016 -----------------------------------------  The patient has been seen by psychiatry in the emergency department. They believe the patient is safe for discharge home and have rescinded the IVC. Patient is medical workup has been largely nonrevealing. We will discharge home at this time.   Harvest Dark, MD 12/09/16 1438

## 2017-05-20 ENCOUNTER — Emergency Department
Admission: EM | Admit: 2017-05-20 | Discharge: 2017-05-20 | Disposition: A | Discharge status: Home / Self Care | Payer: Medicare Other | Attending: Emergency Medicine | Admitting: Emergency Medicine

## 2017-05-20 ENCOUNTER — Encounter: Payer: Self-pay | Admitting: Emergency Medicine

## 2017-05-20 ENCOUNTER — Emergency Department: Payer: Medicare Other

## 2017-05-20 DIAGNOSIS — Z79899 Other long term (current) drug therapy: Secondary | ICD-10-CM | POA: Diagnosis not present

## 2017-05-20 DIAGNOSIS — I10 Essential (primary) hypertension: Secondary | ICD-10-CM | POA: Diagnosis not present

## 2017-05-20 DIAGNOSIS — J449 Chronic obstructive pulmonary disease, unspecified: Secondary | ICD-10-CM | POA: Diagnosis not present

## 2017-05-20 DIAGNOSIS — R05 Cough: Secondary | ICD-10-CM | POA: Diagnosis present

## 2017-05-20 DIAGNOSIS — J209 Acute bronchitis, unspecified: Secondary | ICD-10-CM | POA: Diagnosis not present

## 2017-05-20 DIAGNOSIS — Z7984 Long term (current) use of oral hypoglycemic drugs: Secondary | ICD-10-CM | POA: Insufficient documentation

## 2017-05-20 MED ORDER — BENZONATATE 100 MG PO CAPS
100.0000 mg | ORAL_CAPSULE | Freq: Once | ORAL | Status: AC
  Administered 2017-05-20: 100 mg via ORAL
  Filled 2017-05-20: qty 1

## 2017-05-20 MED ORDER — AZITHROMYCIN 250 MG PO TABS: ORAL_TABLET | ORAL | 0 refills | Status: AC

## 2017-05-20 MED ORDER — AZITHROMYCIN 500 MG PO TABS
500.0000 mg | ORAL_TABLET | Freq: Once | ORAL | Status: AC
  Administered 2017-05-20: 500 mg via ORAL
  Filled 2017-05-20: qty 1

## 2017-05-20 MED ORDER — BENZONATATE 100 MG PO CAPS: 100.0000 mg | ORAL_CAPSULE | Freq: Three times a day (TID) | ORAL | 0 refills | Status: DC | PRN

## 2017-05-20 NOTE — ED Triage Notes (Signed)
Patient ambulatory to triage with steady gait, without difficulty or distress noted; pt reports prod cough green sputum x 2 days; denies fever; nonsmoker
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
## 2017-09-08 ENCOUNTER — Ambulatory Visit: Payer: Medicare Other | Attending: Neurology

## 2017-09-08 DIAGNOSIS — G4761 Periodic limb movement disorder: Secondary | ICD-10-CM | POA: Diagnosis not present

## 2017-09-08 DIAGNOSIS — G4733 Obstructive sleep apnea (adult) (pediatric): Secondary | ICD-10-CM | POA: Diagnosis present

## 2017-09-23 ENCOUNTER — Ambulatory Visit: Payer: Medicare Other | Attending: Neurology

## 2017-09-23 DIAGNOSIS — G4733 Obstructive sleep apnea (adult) (pediatric): Secondary | ICD-10-CM | POA: Insufficient documentation

## 2017-12-02 ENCOUNTER — Encounter: Payer: Self-pay | Admitting: Emergency Medicine

## 2017-12-02 ENCOUNTER — Other Ambulatory Visit: Payer: Self-pay

## 2017-12-02 ENCOUNTER — Emergency Department
Admission: EM | Admit: 2017-12-02 | Discharge: 2017-12-02 | Disposition: A | Discharge status: Home / Self Care | Payer: Medicare Other | Attending: Emergency Medicine | Admitting: Emergency Medicine

## 2017-12-02 DIAGNOSIS — J449 Chronic obstructive pulmonary disease, unspecified: Secondary | ICD-10-CM | POA: Insufficient documentation

## 2017-12-02 DIAGNOSIS — Z79899 Other long term (current) drug therapy: Secondary | ICD-10-CM | POA: Insufficient documentation

## 2017-12-02 DIAGNOSIS — I10 Essential (primary) hypertension: Secondary | ICD-10-CM | POA: Diagnosis not present

## 2017-12-02 DIAGNOSIS — J069 Acute upper respiratory infection, unspecified: Secondary | ICD-10-CM | POA: Diagnosis not present

## 2017-12-02 DIAGNOSIS — B349 Viral infection, unspecified: Secondary | ICD-10-CM | POA: Diagnosis not present

## 2017-12-02 DIAGNOSIS — B9789 Other viral agents as the cause of diseases classified elsewhere: Secondary | ICD-10-CM

## 2017-12-02 DIAGNOSIS — R05 Cough: Secondary | ICD-10-CM | POA: Diagnosis present

## 2017-12-02 MED ORDER — IPRATROPIUM-ALBUTEROL 0.5-2.5 (3) MG/3ML IN SOLN
3.0000 mL | Freq: Once | RESPIRATORY_TRACT | Status: AC
  Administered 2017-12-02: 3 mL via RESPIRATORY_TRACT
  Filled 2017-12-02: qty 3

## 2017-12-02 MED ORDER — ALBUTEROL SULFATE HFA 108 (90 BASE) MCG/ACT IN AERS: 2.0000 | INHALATION_SPRAY | Freq: Four times a day (QID) | RESPIRATORY_TRACT | 0 refills | Status: DC | PRN

## 2017-12-02 MED ORDER — BENZONATATE 100 MG PO CAPS: 100.0000 mg | ORAL_CAPSULE | Freq: Three times a day (TID) | ORAL | 0 refills | Status: AC | PRN

## 2017-12-02 NOTE — ED Notes (Signed)
First Nurse Note:  Patient complaining of cough.  Alert and oriented.  NAD.

## 2017-12-02 NOTE — ED Notes (Signed)
Pt c/o cough that is causing his chest to hurt - this started yesterday

## 2017-12-02 NOTE — ED Provider Notes (Signed)
[facility] Emergency Department Provider Note  ____________________________________________  Time seen: Approximately 12:49 PM  I have reviewed the triage vital signs and the nursing notes.   HISTORY  Chief Complaint Cough    HPI Mathew Aguilar is a 67 y.o. male presents emergency department for evaluation of nonproductive cough for 1 day.  Patient does not smoke.  No sick contacts.  No fever, chills, nasal congestion, shortness of breath, chest pain, nausea, vomiting, abdominal pain.    Past Medical History:  Diagnosis Date  . Depression   . Hypertension   . Restless leg     Patient Active Problem List   Diagnosis Date Noted  . COPD (chronic obstructive pulmonary disease) ([place]) 04/03/2016  . Alcohol use disorder, mild, abuse 02/27/2016  . Severe recurrent major depression with psychotic features ([place]) 02/27/2016  . Suicidal ideation 02/03/2016  . GERD (gastroesophageal reflux disease) 02/19/2015  . Essential hypertension 02/18/2015  . Restless legs syndrome 02/18/2015    Past Surgical History:  Procedure Laterality Date  . AMPUTATION ARM      Prior to Admission medications   Medication Sig Start Date End Date Taking? Authorizing Provider  ABILIFY MAINTENA 400 MG SRER Inject 400 mg into the skin every 30 (thirty) days. 11/09/16   [provider]  albuterol (PROVENTIL HFA;VENTOLIN HFA) 108 (90 Base) MCG/ACT inhaler Inhale 2 puffs into the lungs every 6 (six) hours as needed for wheezing or shortness of breath. 12/02/17   Laban Emperor, PA-C  atenolol (TENORMIN) 25 MG tablet Take 25 mg by mouth daily.    [provider]  benzonatate (TESSALON PERLES) 100 MG capsule Take 1 capsule (100 mg total) by mouth 3 (three) times daily as needed for cough. 12/02/17 12/02/18  Laban Emperor, PA-C  DULoxetine (CYMBALTA) 60 MG capsule Take 2 capsules (120 mg total) by mouth daily. Patient taking differently: Take 60 mg by mouth 2 (two) times  daily.  04/06/16   [name], MD  fluticasone (FLONASE) 50 MCG/ACT nasal spray Place 1 spray into both nostrils daily. 12/01/16 12/01/17  Eula Listen, MD  hydrochlorothiazide (MICROZIDE) 12.5 MG capsule Take 1 capsule (12.5 mg total) by mouth daily. 04/06/16   [name], MD  lamoTRIgine (LAMICTAL) 100 MG tablet Take 50-100 mg by mouth daily. Take 50 mg by mouth every morning and 100 mg by mouth at bedtime for two days then take 100 mg by mouth daily thereafter.    [provider]  levocetirizine (XYZAL) 5 MG tablet Take 5 mg by mouth every evening.    [provider]  levothyroxine (SYNTHROID, LEVOTHROID) 75 MCG tablet Take 1 tablet (75 mcg total) by mouth daily before breakfast. 04/06/16   [name], MD  loratadine (CLARITIN) 10 MG tablet Take 1 tablet (10 mg total) by mouth daily. 12/01/16   Eula Listen, MD  Melatonin 3 MG TABS Take 3 mg by mouth at bedtime as needed.    [provider]  metFORMIN (GLUCOPHAGE) 500 MG tablet Take 1 tablet (500 mg total) by mouth daily. 04/06/16   [name], MD  mirtazapine (REMERON) 15 MG tablet Take 15 mg by mouth at bedtime. 12/07/16   [provider]  naltrexone (DEPADE) 50 MG tablet Take 50 mg by mouth daily.  02/24/16   [provider]  omega-3 acid ethyl esters (LOVAZA) 1 g capsule Take 1 g by mouth daily.    [provider]  omeprazole (PRILOSEC) 20 MG capsule Take 20 mg by mouth daily.  [provider]  rOPINIRole (REQUIP) 3 MG tablet Take 1 tablet (3 mg total) by mouth at bedtime. Patient taking differently: Take 6 mg by mouth at bedtime.  04/06/16   [name], MD  temazepam (RESTORIL) 15 MG capsule Take 15 mg by mouth daily. 11/30/16   [provider]    Allergies Patient has no known allergies.  Family History  Family history unknown: Yes    Social History Social History    Tobacco Use  . Smoking status: Never Smoker  . Smokeless tobacco: Never Used  Substance Use Topics  . Alcohol use: No    Comment: history of ETOH abuse - sober several months  . Drug use: No     Review of Systems  Constitutional: No fever/chills ENT: Negative for congestion and rhinorrhea. Cardiovascular: No chest pain. Respiratory: Positive for cough. No SOB. Gastrointestinal: No abdominal pain.  No nausea, no vomiting.   Musculoskeletal: Negative for musculoskeletal pain. Skin: Negative for rash, abrasions, lacerations, ecchymosis.  ____________________________________________   PHYSICAL EXAM:  VITAL SIGNS: ED Triage Vitals  Enc Vitals Group     BP 12/02/17 1031 140/66     Pulse Rate 12/02/17 1031 86     Resp 12/02/17 1031 18     Temp 12/02/17 1031 98 F (36.7 C)     Temp Source 12/02/17 1031 Oral     SpO2 12/02/17 1031 97 %     Weight 12/02/17 1030 160 lb (72.6 kg)     Height 12/02/17 1030 5' (1.524 m)     Head Circumference --      Peak Flow --      Pain Score 12/02/17 1043 0     Pain Loc --      Pain Edu? --      Excl. in [place]? --      Constitutional: Alert and oriented. Well appearing and in no acute distress. Eyes: Conjunctivae are normal. PERRL. EOMI. No discharge. Head: Atraumatic. ENT: No frontal and maxillary sinus tenderness.      Ears: Tympanic membranes pearly gray with good landmarks. No discharge.      Nose: No congestion/rhinnorhea.      Mouth/Throat: Mucous membranes are moist. Oropharynx non-erythematous. Tonsils not enlarged. No exudates. Uvula midline. Neck: No stridor.   Hematological/Lymphatic/Immunilogical: No cervical lymphadenopathy. Cardiovascular: Normal rate, regular rhythm.  Good peripheral circulation. Respiratory: Normal respiratory effort without tachypnea or retractions. Lungs CTAB. Good air entry to the bases with no decreased or absent breath sounds. Gastrointestinal: Bowel sounds 4 quadrants. Soft and nontender to  palpation. No guarding or rigidity. No palpable masses. No distention. Musculoskeletal: Full range of motion to all extremities. No gross deformities appreciated. Neurologic:  Normal speech and language. No gross focal neurologic deficits are appreciated.  Skin:  Skin is warm, dry and intact. No rash noted.   ____________________________________________   LABS (all labs ordered are listed, but only abnormal results are displayed)  Labs Reviewed - No data to display ____________________________________________  EKG   ____________________________________________  RADIOLOGY   No results found.  ____________________________________________    PROCEDURES  Procedure(s) performed:    Procedures    Medications  ipratropium-albuterol (DUONEB) 0.5-2.5 (3) MG/3ML nebulizer solution 3 mL (3 mLs Nebulization Given 12/02/17 1119)     ____________________________________________   INITIAL IMPRESSION / ASSESSMENT AND PLAN / ED COURSE  Pertinent labs & imaging results that were available during my care of the patient were reviewed by me and considered in my medical decision making (see chart for  details).  Review of the [place] CSRS was performed in accordance of the [place] prior to dispensing any controlled drugs.     Patient's diagnosis is consistent with viral URI with cough. Vital signs and exam are reassuring. Patient appears well and is staying well hydrated. Patient feels comfortable going home. Patient will be discharged home with prescriptions for albuterol inhaler and tessalon perles. Patient is to follow up with PCP as needed or otherwise directed. Patient is given ED precautions to return to the ED for any worsening or new symptoms.     ____________________________________________  FINAL CLINICAL IMPRESSION(S) / ED DIAGNOSES  Final diagnoses:  Viral URI with cough      NEW MEDICATIONS STARTED DURING THIS VISIT:  ED Discharge Orders        Ordered     albuterol (PROVENTIL HFA;VENTOLIN HFA) 108 (90 Base) MCG/ACT inhaler  Every 6 hours PRN     12/02/17 1122    benzonatate (TESSALON PERLES) 100 MG capsule  3 times daily PRN     12/02/17 1122          This chart was dictated using voice recognition software/Dragon. Despite best efforts to proofread, errors can occur which can change the meaning. Any change was purely unintentional.    Laban Emperor, PA-C 12/02/17 1250    Lisa Roca, MD 12/02/17 1537

## 2017-12-02 NOTE — ED Triage Notes (Signed)
Pt states here with cough that started yesterday. Appears in NAD.

## 2017-12-02 NOTE — ED Notes (Signed)
Pt verbalizes understanding of d/c instructions, follow up and medications.

## 2018-04-22 ENCOUNTER — Emergency Department
Admission: EM | Admit: 2018-04-22 | Discharge: 2018-04-23 | Disposition: A | Discharge status: Other Acute Inpatient Hospital | Payer: Medicare Other | Attending: Emergency Medicine | Admitting: Emergency Medicine

## 2018-04-22 DIAGNOSIS — F332 Major depressive disorder, recurrent severe without psychotic features: Secondary | ICD-10-CM | POA: Diagnosis not present

## 2018-04-22 DIAGNOSIS — F329 Major depressive disorder, single episode, unspecified: Secondary | ICD-10-CM | POA: Diagnosis present

## 2018-04-22 DIAGNOSIS — R45851 Suicidal ideations: Secondary | ICD-10-CM | POA: Diagnosis not present

## 2018-04-22 DIAGNOSIS — R440 Auditory hallucinations: Secondary | ICD-10-CM | POA: Diagnosis not present

## 2018-04-22 DIAGNOSIS — Z79899 Other long term (current) drug therapy: Secondary | ICD-10-CM | POA: Insufficient documentation

## 2018-04-22 DIAGNOSIS — Z7984 Long term (current) use of oral hypoglycemic drugs: Secondary | ICD-10-CM | POA: Diagnosis not present

## 2018-04-22 DIAGNOSIS — F419 Anxiety disorder, unspecified: Secondary | ICD-10-CM | POA: Diagnosis not present

## 2018-04-22 DIAGNOSIS — Z046 Encounter for general psychiatric examination, requested by authority: Secondary | ICD-10-CM | POA: Insufficient documentation

## 2018-04-22 DIAGNOSIS — I10 Essential (primary) hypertension: Secondary | ICD-10-CM | POA: Diagnosis not present

## 2018-04-22 DIAGNOSIS — J449 Chronic obstructive pulmonary disease, unspecified: Secondary | ICD-10-CM | POA: Diagnosis not present

## 2018-04-22 LAB — CBC
HCT: 51.5 % (ref 40.0–52.0)
Hemoglobin: 18 g/dL (ref 13.0–18.0)
MCH: 32.4 pg (ref 26.0–34.0)
MCHC: 35 g/dL (ref 32.0–36.0)
MCV: 92.7 fL (ref 80.0–100.0)
PLATELETS: 209 10*3/uL (ref 150–440)
RBC: 5.56 MIL/uL (ref 4.40–5.90)
RDW: 13.7 % (ref 11.5–14.5)
WBC: 4.9 10*3/uL (ref 3.8–10.6)

## 2018-04-22 LAB — ACETAMINOPHEN LEVEL

## 2018-04-22 LAB — URINE DRUG SCREEN, QUALITATIVE (ARMC ONLY)
Amphetamines, Ur Screen: NOT DETECTED
BARBITURATES, UR SCREEN: NOT DETECTED
Cannabinoid 50 Ng, Ur ~~LOC~~: NOT DETECTED
Cocaine Metabolite,Ur ~~LOC~~: NOT DETECTED
MDMA (Ecstasy)Ur Screen: NOT DETECTED
Methadone Scn, Ur: NOT DETECTED
OPIATE, UR SCREEN: NOT DETECTED
PHENCYCLIDINE (PCP) UR S: NOT DETECTED
Tricyclic, Ur Screen: NOT DETECTED

## 2018-04-22 LAB — COMPREHENSIVE METABOLIC PANEL
ALBUMIN: 4.1 g/dL (ref 3.5–5.0)
ALK PHOS: 38 U/L (ref 38–126)
ALT: 99 U/L — ABNORMAL HIGH (ref 0–44)
ANION GAP: 8 (ref 5–15)
AST: 47 U/L — ABNORMAL HIGH (ref 15–41)
BILIRUBIN TOTAL: 2.2 mg/dL — AB (ref 0.3–1.2)
BUN: 14 mg/dL (ref 8–23)
CALCIUM: 8.7 mg/dL — AB (ref 8.9–10.3)
CO2: 23 mmol/L (ref 22–32)
Chloride: 111 mmol/L (ref 98–111)
Creatinine, Ser: 0.85 mg/dL (ref 0.61–1.24)
GFR calc Af Amer: >60 mL/min (ref >60)
Glucose, Bld: 125 mg/dL — ABNORMAL HIGH (ref 70–99)
POTASSIUM: 4 mmol/L (ref 3.5–5.1)
Sodium: 142 mmol/L (ref 135–145)
TOTAL PROTEIN: 7.4 g/dL (ref 6.5–8.1)

## 2018-04-22 LAB — ETHANOL

## 2018-04-22 LAB — SALICYLATE LEVEL: Salicylate Lvl: <7 mg/dL (ref 2.8–30.0)

## 2018-04-22 NOTE — ED Notes (Signed)
[facility] SOC called for consult  1245

## 2018-04-22 NOTE — ED Notes (Signed)
Pt calm and cooperative. Denies pain. Denies HI and AVH.  Pt endorses SI with a plan to walk [place] the [place] tracks.  Pt states the medications he is taking for depression are no longer working. Pt could not identify any specific triggers for the SI.  Denies any medical issues.   Maintained on 15 minute checks and observation by security camera for safety.

## 2018-04-22 NOTE — ED Notes (Signed)
Patient resting quietly in room. No noted distress or abnormal behaviors noted. Will continue 15 minute checks and observation by security camera for safety. 

## 2018-04-22 NOTE — ED Triage Notes (Signed)
Pt walked here from his apartment, states he is having suicidal thoughts of stepping in front of a train.

## 2018-04-22 NOTE — ED Notes (Signed)
Pt given remote for TV. Pt understands he is waiting to speak with the psychiatrist.   Maintained on 15 minute checks and observation by security camera for safety.
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
## 2018-04-22 NOTE — Progress Notes (Addendum)
[facility] Presents guarded with flat affect and depressed mood. Rates his depression 8/10, anxiety 3/10 and hopelessness 0/10. Pt reports worsening depression within the last month with plan to walk in front of a moving train prior to admission. Endorses passive SI without an active plan at this time. Per pt "my depression medicine is not working right now". Reports +AH with current state, last episode was this morning; stated to writer "I hear people calling my name but when I look I don't see nobody". Pt's plan on this admission is to "start feeling better". Emotional support offered. Encouraged pt to voice concerns / needs. Q 15 minutes safety checks maintained without self harm gestures or outburst to note thus far.

## 2018-04-22 NOTE — ED Notes (Signed)
[facility]/  PENDING  PLACEMENT 

## 2018-04-22 NOTE — ED Notes (Signed)
Pt speaking with SOC at this time.   Maintained on 15 minute checks and observation by security camera for safety.
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
## 2018-04-22 NOTE — ED Notes (Signed)
ED BHU [place] Is the patient under IVC or is there intent for IVC: No. Is the patient medically cleared: Yes.   Is there vacancy in the ED BHU: Yes.   Is the population mix appropriate for patient: Yes.   Is the patient awaiting placement in inpatient or outpatient setting: Yes.   Has the patient had a psychiatric consult: Yes.   Survey of unit performed for contraband, proper placement and condition of furniture, tampering with fixtures in bathroom, shower, and each patient room: Yes.   APPEARANCE/BEHAVIOR calm, cooperative and adequate rapport can be established NEURO ASSESSMENT Orientation: time, place and person Hallucinations: Yes.  Auditory Hallucinations Speech: Normal Gait: normal RESPIRATORY ASSESSMENT Normal expansion.  Clear to auscultation.  No rales, rhonchi, or wheezing. CARDIOVASCULAR ASSESSMENT regular rate and rhythm, S1, S2 normal, no murmur, click, rub or gallop GASTROINTESTINAL ASSESSMENT soft, nontender, BS WNL, no r/g EXTREMITIES normal strength, tone, and muscle mass PLAN OF CARE Provide calm/safe environment. Vital signs assessed twice daily. ED BHU Assessment once each 12-hour shift. Collaborate with intake RN daily or as condition indicates. Assure the ED provider has rounded once each shift. Provide and encourage hygiene. Provide redirection as needed. Assess for escalating behavior; address immediately and inform ED provider.  Assess family dynamic and appropriateness for visitation as needed: Yes.   Educate the patient/family about BHU procedures/visitation: Yes.

## 2018-04-23 NOTE — ED Provider Notes (Signed)
-----------------------------------------   6:50 AM on 04/23/2018 -----------------------------------------   Blood pressure 133/68, pulse (!) 56, temperature 97.7 F (36.5 C), temperature source Oral, resp. rate 16, height 5' (1.524 m), weight 81.6 kg, SpO2 96 %.  The patient had no acute events since last update.  Sleeping at this time.  Disposition is pending Psychiatry/Behavioral Medicine team recommendations.     Paulette Blanch, MD 04/23/18 [phone]

## 2018-04-23 NOTE — BH Assessment (Signed)
Pt currently under review at [facility] spoke with [facility] requesting pt's chest x ray, EKG, IVC copy, and urinalysis be faxed to [phone]

## 2018-04-23 NOTE — BH Assessment (Signed)
Patient has been accepted to [facility].  Patient assigned to [place] Accepting physician is Dr. Selinda Flavin.  Call report to [phone].  Representative was [employer].   ER Staff is aware of it:  [place], ER Sectary  Dr. Cinda Quest, ER MD  Benjamine Mola, Patient's Nurse  Address: [address]  [place], [place] [address]

## 2018-04-23 NOTE — BH Assessment (Signed)
Pt referral information faxed to the following inpatient hospitals:  [facility]    [address] [place] [address] Phone: [phone] Fax: [place]   [address], [place] [address] Phone: [phone] Fax: [place]    [address], [place] [address] Phone: [phone] Fax: [facility] [place], [place] [address] Phone: [phone] Fax: [phone] [facility] [address] [place] [address] Phone: [phone] Fax: [phone] [facility] [address] [place],  [place] [address] Phone: [phone]

## 2018-04-23 NOTE — ED Notes (Signed)
Patient is alert and oriented x 4.  Presents with anxious affect and depressed mood.  Denies suicidal thoughts, pain, auditory and visual hallucinations.  Patient visible in dayroom pacing at times.  Routine safety checks maintained every 15 minutes.  Offered support and encouragement as needed.  Personal belongings transported with patient.  Verbalizes understanding of discharge instructions.

## 2018-08-22 ENCOUNTER — Ambulatory Visit: Payer: Medicare Other | Admitting: Anesthesiology

## 2018-08-22 ENCOUNTER — Encounter
Admission: RE | Disposition: A | Discharge status: Home / Self Care | Payer: Self-pay | Source: Ambulatory Visit | Attending: Gastroenterology

## 2018-08-22 ENCOUNTER — Ambulatory Visit
Admission: RE | Admit: 2018-08-22 | Discharge: 2018-08-22 | Disposition: A | Discharge status: Home / Self Care | Payer: Medicare Other | Source: Ambulatory Visit | Attending: Gastroenterology | Admitting: Gastroenterology

## 2018-08-22 ENCOUNTER — Other Ambulatory Visit: Payer: Self-pay

## 2018-08-22 ENCOUNTER — Encounter: Payer: Self-pay | Admitting: *Deleted

## 2018-08-22 DIAGNOSIS — D123 Benign neoplasm of transverse colon: Secondary | ICD-10-CM | POA: Insufficient documentation

## 2018-08-22 DIAGNOSIS — G4730 Sleep apnea, unspecified: Secondary | ICD-10-CM | POA: Insufficient documentation

## 2018-08-22 DIAGNOSIS — Z1211 Encounter for screening for malignant neoplasm of colon: Secondary | ICD-10-CM | POA: Insufficient documentation

## 2018-08-22 DIAGNOSIS — F603 Borderline personality disorder: Secondary | ICD-10-CM | POA: Diagnosis not present

## 2018-08-22 DIAGNOSIS — K640 First degree hemorrhoids: Secondary | ICD-10-CM | POA: Diagnosis not present

## 2018-08-22 DIAGNOSIS — K5730 Diverticulosis of large intestine without perforation or abscess without bleeding: Secondary | ICD-10-CM | POA: Insufficient documentation

## 2018-08-22 DIAGNOSIS — Z79899 Other long term (current) drug therapy: Secondary | ICD-10-CM | POA: Diagnosis not present

## 2018-08-22 DIAGNOSIS — R21 Rash and other nonspecific skin eruption: Secondary | ICD-10-CM | POA: Insufficient documentation

## 2018-08-22 DIAGNOSIS — Z79890 Hormone replacement therapy (postmenopausal): Secondary | ICD-10-CM | POA: Insufficient documentation

## 2018-08-22 DIAGNOSIS — F419 Anxiety disorder, unspecified: Secondary | ICD-10-CM | POA: Diagnosis not present

## 2018-08-22 DIAGNOSIS — F329 Major depressive disorder, single episode, unspecified: Secondary | ICD-10-CM | POA: Insufficient documentation

## 2018-08-22 DIAGNOSIS — Z7984 Long term (current) use of oral hypoglycemic drugs: Secondary | ICD-10-CM | POA: Diagnosis not present

## 2018-08-22 DIAGNOSIS — E039 Hypothyroidism, unspecified: Secondary | ICD-10-CM | POA: Diagnosis not present

## 2018-08-22 DIAGNOSIS — J45909 Unspecified asthma, uncomplicated: Secondary | ICD-10-CM | POA: Insufficient documentation

## 2018-08-22 DIAGNOSIS — D125 Benign neoplasm of sigmoid colon: Secondary | ICD-10-CM | POA: Diagnosis not present

## 2018-08-22 DIAGNOSIS — D122 Benign neoplasm of ascending colon: Secondary | ICD-10-CM | POA: Diagnosis not present

## 2018-08-22 DIAGNOSIS — K219 Gastro-esophageal reflux disease without esophagitis: Secondary | ICD-10-CM | POA: Diagnosis not present

## 2018-08-22 DIAGNOSIS — Z7951 Long term (current) use of inhaled steroids: Secondary | ICD-10-CM | POA: Insufficient documentation

## 2018-08-22 DIAGNOSIS — G2581 Restless legs syndrome: Secondary | ICD-10-CM | POA: Diagnosis not present

## 2018-08-22 DIAGNOSIS — I10 Essential (primary) hypertension: Secondary | ICD-10-CM | POA: Diagnosis not present

## 2018-08-22 HISTORY — DX: Sleep apnea, unspecified: G47.30

## 2018-08-22 HISTORY — DX: Gastro-esophageal reflux disease without esophagitis: K21.9

## 2018-08-22 HISTORY — DX: Cardiac arrhythmia, unspecified: I49.9

## 2018-08-22 HISTORY — DX: Solitary pulmonary nodule: R91.1

## 2018-08-22 HISTORY — DX: Male erectile dysfunction, unspecified: N52.9

## 2018-08-22 HISTORY — DX: Unspecified asthma, uncomplicated: J45.909

## 2018-08-22 HISTORY — DX: Fatty (change of) liver, not elsewhere classified: K76.0

## 2018-08-22 HISTORY — PX: COLONOSCOPY WITH PROPOFOL: SHX5780

## 2018-08-22 HISTORY — DX: Borderline personality disorder: F60.3

## 2018-08-22 HISTORY — DX: Anxiety disorder, unspecified: F41.9

## 2018-08-22 HISTORY — DX: Hypothyroidism, unspecified: E03.9

## 2018-08-22 HISTORY — DX: Suicide attempt, initial encounter: T14.91XA

## 2018-08-22 LAB — CBC WITH DIFFERENTIAL/PLATELET
Abs Immature Granulocytes: 0.01 10*3/uL (ref 0.00–0.07)
BASOS ABS: 0.1 10*3/uL (ref 0.0–0.1)
Basophils Relative: 1 %
EOS ABS: 0.1 10*3/uL (ref 0.0–0.5)
EOS PCT: 3 %
HEMATOCRIT: 53.3 % — AB (ref 39.0–52.0)
Hemoglobin: 18.6 g/dL — ABNORMAL HIGH (ref 13.0–17.0)
IMMATURE GRANULOCYTES: 0 %
LYMPHS ABS: 1.2 10*3/uL (ref 0.7–4.0)
Lymphocytes Relative: 29 %
MCH: 31.7 pg (ref 26.0–34.0)
MCHC: 34.9 g/dL (ref 30.0–36.0)
MCV: 90.8 fL (ref 80.0–100.0)
Monocytes Absolute: 0.5 10*3/uL (ref 0.1–1.0)
Monocytes Relative: 11 %
NEUTROS PCT: 56 %
NRBC: 0 % (ref 0.0–0.2)
Neutro Abs: 2.4 10*3/uL (ref 1.7–7.7)
PLATELETS: 207 10*3/uL (ref 150–400)
RBC: 5.87 MIL/uL — AB (ref 4.22–5.81)
RDW: 13.1 % (ref 11.5–15.5)
WBC: 4.2 10*3/uL (ref 4.0–10.5)

## 2018-08-22 LAB — PROTIME-INR
INR: 1.05
Prothrombin Time: 13.6 seconds (ref 11.4–15.2)

## 2018-08-22 SURGERY — COLONOSCOPY WITH PROPOFOL
Anesthesia: General

## 2018-08-22 MED ORDER — ONDANSETRON HCL 4 MG/2ML IJ SOLN
INTRAMUSCULAR | Status: AC
  Filled 2018-08-22: qty 2

## 2018-08-22 MED ORDER — PROPOFOL 500 MG/50ML IV EMUL
INTRAVENOUS | Status: DC | PRN
  Administered 2018-08-22: 120 ug/kg/min via INTRAVENOUS

## 2018-08-22 MED ORDER — PROPOFOL 500 MG/50ML IV EMUL
INTRAVENOUS | Status: AC
  Filled 2018-08-22: qty 50

## 2018-08-22 MED ORDER — MIDAZOLAM HCL 2 MG/2ML IJ SOLN
INTRAMUSCULAR | Status: AC
  Filled 2018-08-22: qty 2

## 2018-08-22 MED ORDER — SODIUM CHLORIDE 0.9 % IV SOLN
INTRAVENOUS | Status: DC
  Administered 2018-08-22: 11:00:00 via INTRAVENOUS

## 2018-08-22 MED ORDER — FENTANYL CITRATE (PF) 100 MCG/2ML IJ SOLN
INTRAMUSCULAR | Status: AC
  Filled 2018-08-22: qty 2

## 2018-08-22 MED ORDER — MIDAZOLAM HCL 2 MG/2ML IJ SOLN
INTRAMUSCULAR | Status: DC | PRN
  Administered 2018-08-22: 2 mg via INTRAVENOUS

## 2018-08-22 MED ORDER — FENTANYL CITRATE (PF) 100 MCG/2ML IJ SOLN
INTRAMUSCULAR | Status: DC | PRN
  Administered 2018-08-22 (×2): 50 ug via INTRAVENOUS

## 2018-08-22 NOTE — Anesthesia Preprocedure Evaluation (Signed)
Anesthesia Evaluation  Patient identified by MRN, date of birth, ID band Patient awake    Reviewed: Allergy & Precautions, H&P , NPO status , Patient's Chart, lab work & pertinent test results  History of Anesthesia Complications Negative for: history of anesthetic complications  Airway Mallampati: III  TM Distance: <3 FB Neck ROM: limited    Dental  (+) Poor Dentition, Upper Dentures, Lower Dentures   Pulmonary asthma , sleep apnea , COPD,           Cardiovascular Exercise Tolerance: Good hypertension, (-) angina(-) Past MI and (-) DOE + dysrhythmias      Neuro/Psych PSYCHIATRIC DISORDERS negative neurological ROS     GI/Hepatic Neg liver ROS, GERD  Medicated and Controlled,  Endo/Other  Hypothyroidism   Renal/GU negative Renal ROS  negative genitourinary   Musculoskeletal   Abdominal   Peds  Hematology negative hematology ROS (+)   Anesthesia Other Findings Past Medical History: No date: Anxiety No date: Asthma No date: Borderline personality disorder ([place]) No date: Depression No date: Dysrhythmia No date: Erectile dysfunction No date: Fatty liver No date: GERD (gastroesophageal reflux disease) No date: Hypertension No date: Hypothyroidism No date: Pulmonary nodule, right No date: Restless leg No date: Sleep apnea No date: Suicide attempt [facility])  Past Surgical History: No date: AMPUTATION ARM No date: COLONOSCOPY  BMI    Body Mass Index:  31.25 kg/m      Reproductive/Obstetrics negative OB ROS                             Anesthesia Physical Anesthesia Plan  ASA: III  Anesthesia Plan: General   Post-op Pain Management:    Induction: Intravenous  PONV Risk Score and Plan: Propofol infusion and TIVA  Airway Management Planned: Natural Airway and Nasal Cannula  Additional Equipment:   Intra-op Plan:   Post-operative Plan:   Informed Consent: I have reviewed  the patients History and Physical, chart, labs and discussed the procedure including the risks, benefits and alternatives for the proposed anesthesia with the patient or authorized representative who has indicated his/her understanding and acceptance.   Dental Advisory Given  Plan Discussed with: Anesthesiologist, CRNA and Surgeon  Anesthesia Plan Comments: (Patient consented for risks of anesthesia including but not limited to:  - adverse reactions to medications - risk of intubation if required - damage to teeth, lips or other oral mucosa - sore throat or hoarseness - Damage to heart, brain, lungs or loss of life  Patient voiced understanding.)        Anesthesia Quick Evaluation

## 2018-08-22 NOTE — Anesthesia Post-op Follow-up Note (Signed)
Anesthesia QCDR form completed.        

## 2018-08-22 NOTE — Op Note (Signed)
[facility] Patient Name: Mathew Aguilar Procedure Date: 08/22/2018 11:22 AM MRN: [id number] Account #: [id number] Date of Birth: 05-20-1951 Admit Type: Outpatient Age: 67 Room: [facility] ENDO ROOM 1 Gender: Male Note Status: Finalized Procedure:            Colonoscopy Indications:          Screening for colorectal malignant neoplasm Providers:            Lollie Sails, MD Referring MD:         No Local Md, MD (Referring MD) Medicines:            Monitored Anesthesia Care Complications:        No immediate complications. Procedure:            Pre-Anesthesia Assessment:                       - ASA Grade Assessment: III - A patient with severe                        systemic disease.                       After obtaining informed consent, the colonoscope was                        passed under direct vision. Throughout the procedure,                        the patient's blood pressure, pulse, and oxygen                        saturations were monitored continuously. The                        Colonoscope was introduced through the anus and                        advanced to the the cecum, identified by appendiceal                        orifice and ileocecal valve. The colonoscopy was                        performed without difficulty. The patient tolerated the                        procedure well. The quality of the bowel preparation                        was good. Findings:      Multiple small-mouthed diverticula were found in the sigmoid colon and       descending colon.      A 3 mm polyp was found in the transverse colon. The polyp was sessile.       The polyp was removed with a cold biopsy forceps. Resection and       retrieval were complete.      Three sessile polyps were found in the proximal ascending colon. The       polyps were 1 to 2 mm in size. These polyps were removed with a cold  biopsy forceps. Resection and retrieval  were complete.      A 2 mm polyp was found in the descending colon. The polyp was sessile.       The polyp was removed with a cold biopsy forceps. Resection and       retrieval were complete.      A 1 mm polyp was found in the distal sigmoid colon. The polyp was       sessile. The polyp was removed with a cold biopsy forceps. Resection and       retrieval were complete.      Non-bleeding internal hemorrhoids were found during anoscopy. The       hemorrhoids were small and Grade I (internal hemorrhoids that do not       prolapse).      The perianal exam findings include a perianal rash.      The digital rectal exam was normal otherwise.      No additional abnormalities were found on retroflexion. Impression:           - Diverticulosis in the sigmoid colon and in the                        descending colon.                       - One 3 mm polyp in the transverse colon, removed with                        a cold biopsy forceps. Resected and retrieved.                       - Three 1 to 2 mm polyps in the proximal ascending                        colon, removed with a cold biopsy forceps. Resected and                        retrieved.                       - One 2 mm polyp in the descending colon, removed with                        a cold biopsy forceps. Resected and retrieved.                       - One 1 mm polyp in the distal sigmoid colon, removed                        with a cold biopsy forceps. Resected and retrieved.                       - Non-bleeding internal hemorrhoids.                       - Perianal rash found on perianal exam. Recommendation:       - Discharge patient to home.                       - Soft diet today, then advance as tolerated to advance  diet as tolerated.                       - Await pathology results.                       - apply desitin cream (OTC) externally to perirectal                        area tid for one  week. Procedure Code(s):    --- Professional ---                       [phone], Colonoscopy, flexible; with biopsy, single or                        multiple Diagnosis Code(s):    --- Professional ---                       Z12.11, Encounter for screening for malignant neoplasm                        of colon                       K64.0, First degree hemorrhoids                       D12.3, Benign neoplasm of transverse colon (hepatic                        flexure or splenic flexure)                       D12.4, Benign neoplasm of descending colon                       D12.5, Benign neoplasm of sigmoid colon                       D12.2, Benign neoplasm of ascending colon                       R21, Rash and other nonspecific skin eruption                       K57.30, Diverticulosis of large intestine without                        perforation or abscess without bleeding CPT copyright 2018 American Medical Association. All rights reserved. The codes documented in this report are preliminary and upon coder review may  be revised to meet current compliance requirements. Lollie Sails, MD 08/22/2018 11:49:30 AM This report has been signed electronically. Number of Addenda: 0 Note Initiated On: 08/22/2018 11:22 AM Scope Withdrawal Time: 0 hours 9 minutes 48 seconds  Total Procedure Duration: 0 hours 15 minutes 11 seconds       [facility]

## 2018-08-22 NOTE — Transfer of Care (Signed)
Immediate Anesthesia Transfer of Care Note  Patient: Mathew Aguilar  Procedure(s) Performed: COLONOSCOPY WITH PROPOFOL (N/A )  Patient Location: PACU  Anesthesia Type:General  Level of Consciousness: awake and sedated  Airway & Oxygen Therapy: Patient Spontanous Breathing and Patient connected to nasal cannula oxygen  Post-op Assessment: Report given to RN and Post -op Vital signs reviewed and stable  Post vital signs: Reviewed and stable  Last Vitals:  Vitals Value Taken Time  BP    Temp    Pulse    Resp    SpO2      Last Pain:  Vitals:   08/22/18 0940  PainSc: 0-No pain         Complications: No apparent anesthesia complications

## 2018-08-22 NOTE — Anesthesia Procedure Notes (Signed)
Performed by: Vaughan Sine Pre-anesthesia Checklist: Emergency Drugs available, Patient identified, Suction available, Patient being monitored and Timeout performed Patient Re-evaluated:Patient Re-evaluated prior to induction Oxygen Delivery Method: Simple face mask Preoxygenation: Pre-oxygenation with 100% oxygen Induction Type: IV induction Placement Confirmation: positive ETCO2 and CO2 detector

## 2018-08-22 NOTE — Anesthesia Postprocedure Evaluation (Signed)
Anesthesia Post Note  Patient: HANEEF HALLQUIST  Procedure(s) Performed: COLONOSCOPY WITH PROPOFOL (N/A )  Patient location during evaluation: Endoscopy Anesthesia Type: General Level of consciousness: awake and alert Pain management: pain level controlled Vital Signs Assessment: post-procedure vital signs reviewed and stable Respiratory status: spontaneous breathing, nonlabored ventilation, respiratory function stable and patient connected to nasal cannula oxygen Cardiovascular status: blood pressure returned to baseline and stable Postop Assessment: no apparent nausea or vomiting Anesthetic complications: no     Last Vitals:  Vitals:   08/22/18 1217 08/22/18 1227  BP: 128/70 (!) 143/65  Pulse: 67 (!) 58  Resp: 17 12  Temp:    SpO2: 99% 95%    Last Pain:  Vitals:   08/22/18 1227  TempSrc:   PainSc: 0-No pain                 Precious Haws Syed Zukas

## 2018-08-22 NOTE — H&P (Signed)
Outpatient short stay form Pre-procedure 08/22/2018 11:14 AM Lollie Sails MD  Primary Physician: Dr. Glendon Axe  Reason for visit: Colonoscopy  History of present illness: Patient is a 67 year old male presenting today for colon cancer screening.  He has had a colonoscopy in the past but it was a "long time ago at [facility]", the report is unavailable however it was done 02/16/2002.  Tolerated his prep well.  He takes no aspirin or blood thinning agent.  There is a history of fatty liver and coags this morning were normal.    Current Facility-Administered Medications:  .  0.9 %  sodium chloride infusion, , Intravenous, Continuous, Lollie Sails, MD .  0.9 %  sodium chloride infusion, , Intravenous, Continuous, Lollie Sails, MD  Medications Prior to Admission  Medication Sig Dispense Refill Last Dose  . ABILIFY MAINTENA 400 MG SRER Inject 400 mg into the skin every 30 (thirty) days.   Past Week at Unknown time  . atenolol (TENORMIN) 25 MG tablet Take 25 mg by mouth daily.   Past Week at Unknown time  . benzonatate (TESSALON PERLES) 100 MG capsule Take 1 capsule (100 mg total) by mouth 3 (three) times daily as needed for cough. 30 capsule 0 Past Week at Unknown time  . doxazosin (CARDURA) 8 MG tablet Take 8 mg by mouth daily.   Past Week at Unknown time  . DULoxetine (CYMBALTA) 60 MG capsule Take 2 capsules (120 mg total) by mouth daily. (Patient taking differently: Take 60 mg by mouth 2 (two) times daily. ) 60 capsule 0 Past Week at Unknown time  . finasteride (PROSCAR) 5 MG tablet Take 5 mg by mouth daily.     Marland Kitchen levocetirizine (XYZAL) 5 MG tablet Take 5 mg by mouth every evening.   Past Week at Unknown time  . levothyroxine (SYNTHROID, LEVOTHROID) 75 MCG tablet Take 1 tablet (75 mcg total) by mouth daily before breakfast. 30 tablet 0 Past Week at Unknown time  . loratadine (CLARITIN) 10 MG tablet Take 1 tablet (10 mg total) by mouth daily. 30 tablet 0 Past Week at Unknown  time  . Melatonin 3 MG TABS Take 3 mg by mouth at bedtime as needed.   Past Week at Unknown time  . metFORMIN (GLUCOPHAGE) 500 MG tablet Take 1 tablet (500 mg total) by mouth daily. 30 tablet 0 Past Week at Unknown time  . mirtazapine (REMERON) 15 MG tablet Take 15 mg by mouth at bedtime.   Past Week at Unknown time  . naltrexone (DEPADE) 50 MG tablet Take 50 mg by mouth daily.    Past Week at Unknown time  . omega-3 acid ethyl esters (LOVAZA) 1 g capsule Take 1 g by mouth daily.   Past Week at Unknown time  . omeprazole (PRILOSEC) 20 MG capsule Take 20 mg by mouth daily.   Past Week at Unknown time  . pramipexole (MIRAPEX) 0.125 MG tablet Take 0.125 mg by mouth 2 (two) times daily.   Past Week at Unknown time  . primidone (MYSOLINE) 50 MG tablet Take by mouth at bedtime.   Past Week at Unknown time  . temazepam (RESTORIL) 15 MG capsule Take 15 mg by mouth daily.   Past Week at Unknown time  . albuterol (PROVENTIL HFA;VENTOLIN HFA) 108 (90 Base) MCG/ACT inhaler Inhale 2 puffs into the lungs every 6 (six) hours as needed for wheezing or shortness of breath. 1 Inhaler 0   . fluticasone (FLONASE) 50 MCG/ACT nasal spray  Place 1 spray into both nostrils daily. 16 g 0 Unknown at Unknown  . hydrochlorothiazide (MICROZIDE) 12.5 MG capsule Take 1 capsule (12.5 mg total) by mouth daily. 30 capsule 0 Unknown at Unknown  . lamoTRIgine (LAMICTAL) 100 MG tablet Take 50-100 mg by mouth daily. Take 50 mg by mouth every morning and 100 mg by mouth at bedtime for two days then take 100 mg by mouth daily thereafter.   Unknown at Unknown  . rOPINIRole (REQUIP) 3 MG tablet Take 1 tablet (3 mg total) by mouth at bedtime. (Patient taking differently: Take 6 mg by mouth at bedtime. ) 30 tablet 0 Unknown at Unknown     No Known Allergies   Past Medical History:  Diagnosis Date  . Anxiety   . Asthma   . Borderline personality disorder ([place])   . Depression   . Dysrhythmia   . Erectile dysfunction   . Fatty liver    . GERD (gastroesophageal reflux disease)   . Hypertension   . Hypothyroidism   . Pulmonary nodule, right   . Restless leg   . Sleep apnea   . Suicide attempt [facility])     Review of systems:      Physical Exam    Heart and lungs: Regular rate and rhythm without rub or gallop, lungs are bilaterally clear.    HEENT: Normocephalic atraumatic eyes are anicteric    Other:    Pertinant exam for procedure: Soft nontender nondistended bowel sounds positive normoactive.    Planned proceedures: Colonoscopy and indicated procedures. I have discussed the risks benefits and complications of procedures to include not limited to bleeding, infection, perforation and the risk of sedation and the patient wishes to proceed.    Lollie Sails, MD Gastroenterology 08/22/2018  11:14 AM

## 2018-08-23 LAB — SURGICAL PATHOLOGY

## 2019-03-21 ENCOUNTER — Encounter: Payer: Self-pay | Admitting: Urology

## 2019-03-21 ENCOUNTER — Ambulatory Visit (INDEPENDENT_AMBULATORY_CARE_PROVIDER_SITE_OTHER): Payer: Medicare Other | Admitting: Urology

## 2019-03-21 ENCOUNTER — Other Ambulatory Visit: Payer: Self-pay

## 2019-03-21 VITALS — BP 144/79 | HR 76 | Ht 60.0 in

## 2019-03-21 DIAGNOSIS — R350 Frequency of micturition: Secondary | ICD-10-CM | POA: Diagnosis not present

## 2019-03-21 DIAGNOSIS — N281 Cyst of kidney, acquired: Secondary | ICD-10-CM

## 2019-03-21 DIAGNOSIS — R3914 Feeling of incomplete bladder emptying: Secondary | ICD-10-CM

## 2019-03-21 DIAGNOSIS — N401 Enlarged prostate with lower urinary tract symptoms: Secondary | ICD-10-CM | POA: Diagnosis not present

## 2019-03-21 LAB — MICROSCOPIC EXAMINATION
Bacteria, UA: NONE SEEN
Epithelial Cells (non renal): NONE SEEN /hpf (ref 0–10)

## 2019-03-21 LAB — BLADDER SCAN AMB NON-IMAGING

## 2019-03-21 LAB — URINALYSIS, COMPLETE
Bilirubin, UA: NEGATIVE
Ketones, UA: NEGATIVE
Nitrite, UA: NEGATIVE
Protein,UA: NEGATIVE
Specific Gravity, UA: 1.02 (ref 1.005–1.030)
Urobilinogen, Ur: 0.2 mg/dL (ref 0.2–1.0)
pH, UA: 5 (ref 5.0–7.5)

## 2019-03-21 MED ORDER — OXYBUTYNIN CHLORIDE ER 10 MG PO TB24: 10.0000 mg | ORAL_TABLET | Freq: Every day | ORAL | 6 refills | Status: DC

## 2019-03-21 NOTE — Progress Notes (Signed)
03/21/2019 2:10 PM   Mathew Aguilar June 05, 1951 [id number]  Referring provider: Medicine, [facility] [address] [place],  [place] [id number]  Chief Complaint  Patient presents with  . Benign Prostatic Hypertrophy    HPI: 68 year old male referred for further evaluation of BPH with lower tract urinary symptoms.  He is currently on doxazosin and finasteride.  Despite this, he continues to have refractory symptoms.  IPSS as below.  His primary complaint is urinary frequency which is most bothered some to him.  He also gets up 3 times at night to void.  This is been present for at least 1 year in duration.  No exacerbating or alleviating factors.  No history of UTIs or gross hematuria.  No history of bladder stones.  Most recent PSA 10/27/2018  0.56.  No family history of prostate cancer.  He is not interested in a rectal exam today.  Most recent cross-sectional imaging in the form of CT abdomen pelvis with contrast on 03/2015.  This shows a right-sided renal lesion which is not completely characterized on this study.  Notably, the it was unchanged from 2014.  Time of the study in 2016, the lesion measured 10 mm x 5 mm.  He reports that he was told about this lesion in 2014 following an accident.  His primary care is been following this for him.  He is not had imaging in 4 years.  He denies any flank pain.  Calculated prostate volume in 2016 was 69.8 based on sizes of 5.09 x 5.47 x 4.57 cm on CT scan.     IPSS    Row Name 03/21/19 1300         International Prostate Symptom Score   How often have you had the sensation of not emptying your bladder?  Almost always     How often have you had to urinate less than every two hours?  More than half the time     How often have you found you stopped and started again several times when you urinated?  Not at All     How often have you found it difficult to postpone urination?  Less than 1 in 5 times     How often have you  had a weak urinary stream?  About half the time     How often have you had to strain to start urination?  Not at All     How many times did you typically get up at night to urinate?  3 Times     Total IPSS Score  16       Quality of Life due to urinary symptoms   If you were to spend the rest of your life with your urinary condition just the way it is now how would you feel about that?  Mixed        Score:  1-7 Mild 8-19 Moderate 20-35 Severe   PMH: Past Medical History:  Diagnosis Date  . Anxiety   . Asthma   . Borderline personality disorder ([place])   . Depression   . Dysrhythmia   . Erectile dysfunction   . Fatty liver   . GERD (gastroesophageal reflux disease)   . Hypertension   . Hypothyroidism   . Pulmonary nodule, right   . Restless leg   . Sleep apnea   . Suicide attempt [facility])     Surgical History: Past Surgical History:  Procedure Laterality Date  . AMPUTATION ARM    .  COLONOSCOPY    . COLONOSCOPY WITH PROPOFOL N/A 08/22/2018   Procedure: COLONOSCOPY WITH PROPOFOL;  Surgeon: Lollie Sails, MD;  Location: [facility] ENDOSCOPY;  Service: Endoscopy;  Laterality: N/A;    Home Medications:  Allergies as of 03/21/2019   No Known Allergies     Medication List       Accurate as of March 21, 2019  2:10 PM. If you have any questions, ask your nurse or doctor.        Abilify Maintena 400 MG Srer injection Generic drug: ARIPiprazole ER Inject 400 mg into the skin every 30 (thirty) days.   albuterol 108 (90 Base) MCG/ACT inhaler Commonly known as: VENTOLIN HFA Inhale 2 puffs into the lungs every 6 (six) hours as needed for wheezing or shortness of breath.   atenolol 25 MG tablet Commonly known as: TENORMIN Take 25 mg by mouth daily.   doxazosin 8 MG tablet Commonly known as: CARDURA Take 8 mg by mouth daily.   DULoxetine 60 MG capsule Commonly known as: CYMBALTA Take 2 capsules (120 mg total) by mouth daily. What changed:   how much to take  when to  take this   finasteride 5 MG tablet Commonly known as: PROSCAR Take 5 mg by mouth daily.   fluticasone 50 MCG/ACT nasal spray Commonly known as: Flonase Place 1 spray into both nostrils daily.   hydrochlorothiazide 12.5 MG capsule Commonly known as: MICROZIDE Take 1 capsule (12.5 mg total) by mouth daily.   lamoTRIgine 100 MG tablet Commonly known as: LAMICTAL Take 50-100 mg by mouth daily. Take 50 mg by mouth every morning and 100 mg by mouth at bedtime for two days then take 100 mg by mouth daily thereafter.   levocetirizine 5 MG tablet Commonly known as: XYZAL Take 5 mg by mouth every evening.   levothyroxine 75 MCG tablet Commonly known as: SYNTHROID Take 1 tablet (75 mcg total) by mouth daily before breakfast.   loratadine 10 MG tablet Commonly known as: CLARITIN Take 1 tablet (10 mg total) by mouth daily.   Melatonin 3 MG Tabs Take 3 mg by mouth at bedtime as needed.   metFORMIN 500 MG tablet Commonly known as: GLUCOPHAGE Take 1 tablet (500 mg total) by mouth daily.   mirtazapine 15 MG tablet Commonly known as: REMERON Take 15 mg by mouth at bedtime.   naltrexone 50 MG tablet Commonly known as: DEPADE Take 50 mg by mouth daily.   omega-3 acid ethyl esters 1 g capsule Commonly known as: LOVAZA Take 1 g by mouth daily.   omeprazole 20 MG capsule Commonly known as: PRILOSEC Take 20 mg by mouth daily.   oxybutynin 10 MG 24 hr tablet Commonly known as: DITROPAN-XL Take 1 tablet (10 mg total) by mouth daily. Started by: Hollice Espy, MD   pramipexole 0.125 MG tablet Commonly known as: MIRAPEX Take 0.125 mg by mouth 2 (two) times daily.   primidone 50 MG tablet Commonly known as: MYSOLINE Take by mouth at bedtime.   rOPINIRole 3 MG tablet Commonly known as: REQUIP Take 1 tablet (3 mg total) by mouth at bedtime. What changed: how much to take   temazepam 15 MG capsule Commonly known as: RESTORIL Take 15 mg by mouth daily.       Allergies:  No Known Allergies  Family History: Family History  Family history unknown: Yes    Social History:  reports that he has never smoked. His smokeless tobacco use includes chew. He reports current alcohol use.  He reports that he does not use drugs.  ROS: UROLOGY Frequent Urination?: Yes Hard to postpone urination?: No Burning/pain with urination?: No Get up at night to urinate?: No Leakage of urine?: No Urine stream starts and stops?: No Trouble starting stream?: No Do you have to strain to urinate?: No Blood in urine?: No Urinary tract infection?: No Sexually transmitted disease?: No Injury to kidneys or bladder?: Yes Painful intercourse?: No Weak stream?: No Erection problems?: No Penile pain?: No  Gastrointestinal Nausea?: No Vomiting?: No Indigestion/heartburn?: No Diarrhea?: No Constipation?: No  Constitutional Fever: No Night sweats?: No Weight loss?: No  Skin Skin rash/lesions?: No Itching?: No  Eyes Blurred vision?: No Double vision?: No  Ears/Nose/Throat Sore throat?: No Sinus problems?: No  Hematologic/Lymphatic Swollen glands?: No Easy bruising?: No  Cardiovascular Leg swelling?: No Chest pain?: No  Respiratory Cough?: No Shortness of breath?: No  Endocrine Excessive thirst?: No  Musculoskeletal Back pain?: No Joint pain?: No  Neurological Headaches?: No Dizziness?: No  Psychologic Depression?: No Anxiety?: No  Physical Exam: BP (!) 144/79   Pulse 76   Ht 5' (1.524 m)   BMI 31.25 kg/m   Constitutional:  Alert and oriented, No acute distress. HEENT: [place] AT, moist mucus membranes.  Trachea midline, no masses. Cardiovascular: No clubbing, cyanosis, or edema. Respiratory: Normal respiratory effort, no increased work of breathing. GI: Abdomen is soft, nontender,obese MSK: Right arm surgically absent Skin: No rashes, bruises or suspicious lesions. Neurologic: Grossly intact, no focal deficits, moving all 4 extremities.  Psychiatric: Normal mood and affect.  Recommended rectal exam for prostate cancer screening, patient declined.  Laboratory Data: Lab Results  Component Value Date   WBC 4.2 08/22/2018   HGB 18.6 (H) 08/22/2018   HCT 53.3 (H) 08/22/2018   MCV 90.8 08/22/2018   PLT 207 08/22/2018    Lab Results  Component Value Date   CREATININE 0.85 04/22/2018   Hemoglobin A1c 6.2 on 10/2018  PSA as above.  Urinalysis Significant only today for 3+ glucose, otherwise unremarkable.  See epic for details.  Pertinent Imaging: Results for orders placed or performed in visit on 03/21/19  BLADDER SCAN AMB NON-IMAGING  Result Value Ref Range   Scan Result 10ml      Assessment & Plan:    1. Benign prostatic hyperplasia with urinary frequency We discussed the differential diagnosis of his urinary symptoms including worsening obstructive shin from his prostate, de novo bladder overactivity or combination of the above  Currently on maximal medical therapy for his prostate for several years including Flomax/alpha-blocker  We discussed various options including further diagnostic testing in the form of urodynamics versus treatment of his overactivity symptoms versus consideration of an outlet procedure given his failure to respond to maximal medical therapy for treatment of his BPH  He is interested in pursuing pharmacotherapy for his bladder overactivity at this point time.  We will start with oxybutynin 10 mg XL daily in addition to as above regimen.  Plan for close follow-up of his urinary symptoms with a PVR in about 3 months.  If his symptoms fail to resolve, would consider urodynamics.  In addition to the above, encourage tight glucose control given the presence of glucose in his urine today which may be contributing factor to his urinary symptoms.  - BLADDER SCAN AMB NON-IMAGING - Urinalysis, Complete  2. Renal cyst History of complex right renal cyst  No interval imaging over the past 4  years, would recommend starting with renal ultrasound for further assessment.  He is agreeable this plan.  - US RENAL; Future   Return in about 3 months (around 06/21/2019) for shannon PVR/ IPSS, f/u RUS.  Hollice Espy, MD  [facility] [address] [place], [place] [address] [phone]

## 2019-06-19 ENCOUNTER — Ambulatory Visit
Admission: RE | Admit: 2019-06-19 | Discharge: 2019-06-19 | Disposition: A | Discharge status: Home / Self Care | Payer: Medicare Other | Source: Ambulatory Visit | Attending: Urology | Admitting: Urology

## 2019-06-19 DIAGNOSIS — N281 Cyst of kidney, acquired: Secondary | ICD-10-CM | POA: Insufficient documentation

## 2019-06-20 NOTE — Progress Notes (Signed)
06/21/2019 8:46 PM   Marquette Saa Mar 11, 1951 [id number]  Referring provider: Medicine, [facility] [address] [place],  [place] [address]  Chief Complaint  Patient presents with  . Benign Prostatic Hypertrophy    HPI: 68 year old male with a history of a complex right renal cyst and BPH with lower tract urinary symptoms who presents today for follow up.    History of complex right renal cyst Most recent cross-sectional imaging in the form of CT abdomen pelvis with contrast on 03/2015.  This shows a right-sided renal lesion which is not completely characterized on this study.  Notably, the it was unchanged from 2014.  Time of the study in 2016, the lesion measured 10 mm x 5 mm.  He reports that he was told about this lesion in 2014 following an accident.  His primary care is been following this for him.   RUS on 06/19/2019 revealed normal renal ultrasound. The lesion noted on the prior CT, seen as a roughly 10 mm hypodense area along the lateral mid right kidney, is not visualized sonographically.  BPH WITH LUTS  (prostate and/or bladder) IPSS score:  10/1   PVR: 17 mL   Previous score: 16/3   Previous PVR: 0 mL   Major complaint(s):  Patient may have a limited insight into the mechanism of actions of the medications he is on for his urinary issues.  On paper, it appears his symptoms have improved.  But when he is questioned directly about his symptoms, he states he is still bothered by a weak urinary stream, urinary frequency and nocturia.  Denies any dysuria, hematuria or suprapubic pain.   Currently taking: doxazosin 8 mg daily, finasteride 5 mg daily and oxybutynin XL 10 mg daily.    Calculated prostate volume in 2016 was 69.8 based on sizes of 5.09 x 5.47 x 4.57 cm on CT scan.    Denies any recent fevers, chills, nausea or vomiting.  No history of UTIs or gross hematuria.  No history of bladder stones.  Most recent PSA 10/27/2018  0.56.  No family history of  prostate cancer.    IPSS    Row Name 06/21/19 1300         International Prostate Symptom Score   How often have you had the sensation of not emptying your bladder?  Not at All     How often have you had to urinate less than every two hours?  More than half the time     How often have you found you stopped and started again several times when you urinated?  Not at All     How often have you found it difficult to postpone urination?  Not at All     How often have you had a weak urinary stream?  Almost always     How often have you had to strain to start urination?  Not at All     How many times did you typically get up at night to urinate?  1 Time     Total IPSS Score  10       Quality of Life due to urinary symptoms   If you were to spend the rest of your life with your urinary condition just the way it is now how would you feel about that?  Pleased        Score:  1-7 Mild 8-19 Moderate 20-35 Severe  PMH: Past Medical History:  Diagnosis Date  . Anxiety   .  Asthma   . Borderline personality disorder ([place])   . Depression   . Dysrhythmia   . Erectile dysfunction   . Fatty liver   . GERD (gastroesophageal reflux disease)   . Hypertension   . Hypothyroidism   . Pulmonary nodule, right   . Restless leg   . Sleep apnea   . Suicide attempt [facility])     Surgical History: Past Surgical History:  Procedure Laterality Date  . AMPUTATION ARM    . COLONOSCOPY    . COLONOSCOPY WITH PROPOFOL N/A 08/22/2018   Procedure: COLONOSCOPY WITH PROPOFOL;  Surgeon: Lollie Sails, MD;  Location[facility] ENDOSCOPY;  Service: Endoscopy;  Laterality: N/A;    Home Medications:  Allergies as of 06/21/2019   No Known Allergies     Medication List       Accurate as of June 21, 2019 11:59 PM. If you have any questions, ask your nurse or doctor.        Abilify Maintena 400 MG Srer injection Generic drug: ARIPiprazole ER Inject 400 mg into the skin every 30 (thirty) days.    albuterol 108 (90 Base) MCG/ACT inhaler Commonly known as: VENTOLIN HFA Inhale 2 puffs into the lungs every 6 (six) hours as needed for wheezing or shortness of breath.   atenolol 25 MG tablet Commonly known as: TENORMIN Take 25 mg by mouth daily.   doxazosin 8 MG tablet Commonly known as: CARDURA Take 8 mg by mouth daily.   DULoxetine 60 MG capsule Commonly known as: CYMBALTA Take 2 capsules (120 mg total) by mouth daily. What changed:   how much to take  when to take this   finasteride 5 MG tablet Commonly known as: PROSCAR Take 5 mg by mouth daily.   fluticasone 50 MCG/ACT nasal spray Commonly known as: Flonase Place 1 spray into both nostrils daily.   hydrochlorothiazide 12.5 MG capsule Commonly known as: MICROZIDE Take 1 capsule (12.5 mg total) by mouth daily.   lamoTRIgine 100 MG tablet Commonly known as: LAMICTAL Take 50-100 mg by mouth daily. Take 50 mg by mouth every morning and 100 mg by mouth at bedtime for two days then take 100 mg by mouth daily thereafter.   levocetirizine 5 MG tablet Commonly known as: XYZAL Take 5 mg by mouth every evening.   levothyroxine 75 MCG tablet Commonly known as: SYNTHROID Take 1 tablet (75 mcg total) by mouth daily before breakfast.   loratadine 10 MG tablet Commonly known as: CLARITIN Take 1 tablet (10 mg total) by mouth daily.   Melatonin 3 MG Tabs Take 3 mg by mouth at bedtime as needed.   metFORMIN 500 MG tablet Commonly known as: GLUCOPHAGE Take 1 tablet (500 mg total) by mouth daily.   mirtazapine 15 MG tablet Commonly known as: REMERON Take 15 mg by mouth at bedtime.   naltrexone 50 MG tablet Commonly known as: DEPADE Take 50 mg by mouth daily.   omega-3 acid ethyl esters 1 g capsule Commonly known as: LOVAZA Take 1 g by mouth daily.   omeprazole 20 MG capsule Commonly known as: PRILOSEC Take 20 mg by mouth daily.   oxybutynin 10 MG 24 hr tablet Commonly known as: DITROPAN-XL Take 1 tablet (10  mg total) by mouth daily.   pramipexole 0.125 MG tablet Commonly known as: MIRAPEX Take 0.125 mg by mouth 2 (two) times daily.   primidone 50 MG tablet Commonly known as: MYSOLINE Take by mouth at bedtime.   rOPINIRole 3 MG tablet Commonly  known as: REQUIP Take 1 tablet (3 mg total) by mouth at bedtime. What changed: how much to take   temazepam 15 MG capsule Commonly known as: RESTORIL Take 15 mg by mouth daily.       Allergies: No Known Allergies  Family History: Family History  Family history unknown: Yes    Social History:  reports that he has never smoked. His smokeless tobacco use includes chew. He reports current alcohol use. He reports that he does not use drugs.  ROS: UROLOGY Frequent Urination?: No Hard to postpone urination?: No Burning/pain with urination?: No Get up at night to urinate?: No Leakage of urine?: No Urine stream starts and stops?: No Trouble starting stream?: No Do you have to strain to urinate?: No Blood in urine?: No Urinary tract infection?: No Sexually transmitted disease?: No Injury to kidneys or bladder?: No Painful intercourse?: No Weak stream?: No Erection problems?: No Penile pain?: No  Gastrointestinal Nausea?: No Vomiting?: No Indigestion/heartburn?: No Diarrhea?: No Constipation?: No  Constitutional Fever: No Night sweats?: No Weight loss?: No Fatigue?: No  Skin Skin rash/lesions?: No Itching?: No  Eyes Blurred vision?: Yes Double vision?: No  Ears/Nose/Throat Sore throat?: No Sinus problems?: No  Hematologic/Lymphatic Swollen glands?: No Easy bruising?: No  Cardiovascular Leg swelling?: No Chest pain?: No  Respiratory Cough?: No Shortness of breath?: No  Endocrine Excessive thirst?: No  Musculoskeletal Back pain?: No Joint pain?: No  Neurological Headaches?: No Dizziness?: No  Psychologic Depression?: Yes Anxiety?: No  Physical Exam: BP 116/74 (BP Location: Left Arm, Patient  Position: Sitting, Cuff Size: Normal)   Pulse 76   Ht 5' (1.524 m)   Wt 168 lb (76.2 kg)   BMI 32.81 kg/m  Constitutional:  Well nourished. Alert and oriented, No acute distress. HEENT: [place] AT, moist mucus membranes.  Trachea midline, no masses. Cardiovascular: No clubbing, cyanosis, or edema. Respiratory: Normal respiratory effort, no increased work of breathing. GI: Abdomen is obese.     GU: No CVA tenderness.  No bladder fullness or masses.   Skin: No rashes, bruises or suspicious lesions. Lymph: No inguinal adenopathy. Neurologic: Grossly intact, no focal deficits, moving all 3 extremities.  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   Psychiatric: Normal mood and affect.  Laboratory Data: Lab Results  Component Value Date   WBC 4.2 08/22/2018   HGB 18.6 (H) 08/22/2018   HCT 53.3 (H) 08/22/2018   MCV 90.8 08/22/2018   PLT 207 08/22/2018    Lab Results  Component Value Date   CREATININE 0.85 04/22/2018   Hemoglobin A1c 6.2 on 10/2018  PSA as above.  Pertinent Imaging Results for orders placed or performed in visit on 06/21/19  Bladder Scan (Post Void Residual) in office  Result Value Ref Range   Scan Result 17    CLINICAL DATA:  Renal cyst.  EXAM: RENAL / URINARY TRACT ULTRASOUND COMPLETE  COMPARISON:  CT, 04/11/2015.  FINDINGS: Right Kidney:  Renal measurements: 10.8 x 5.8 x 4.7 cm = volume: 153.6 mL . Echogenicity within normal limits. No mass or hydronephrosis visualized.  Left Kidney:  Renal measurements: 11.0 x 5.3 x 4.5 cm = volume: 135.9 mL. Echogenicity within normal limits. No mass or hydronephrosis visualized.  Bladder:  Appears normal for degree of bladder distention.  IMPRESSION: 1. Normal renal ultrasound. The lesion noted on the prior CT, seen as a roughly 10 mm hypodense area along the lateral mid right kidney, is not visualized sonographically.   Electronically Signed   By: Shanon Brow  Ormond M.D.   On: 06/20/2019 08:08 I have  independently reviewed the film and note a normal ultrasound.     Assessment & Plan:    1. BPH with LUTS IPSS score is 10/1, it is stable Continue conservative management, avoiding bladder irritants and timed voiding's Most bothersome symptoms is/are weak stream and frequency Continue doxazosin 8 mg daily and finasteride 5 mg daily As he continues to be bothered with a weak stream will schedule cystoscopy to evaluate for a BOO and then possibly on to urodynamics pending cystoscopy findings  I have explained to that the cystoscopy consists of passing a tube with a lens up through their urethra and into their urinary bladder.   We will inject the urethra with a lidocaine gel prior to introducing the cystoscope to help with any discomfort during the procedure.   After the procedure, they might experience blood in the urine and discomfort with urination.  This will abate after the first few voids.  I have  encouraged the patient to increase water intake  during this time.  Patient denies any allergies to lidocaine.  - BLADDER SCAN AMB NON-IMAGING   2. Renal cyst Lesion not seen on recent RUS which is reassuring as it has been four years since last imaging study No further follow up warranted   Return for cystoscopy to evaluate for BOO .  Zara Council, PA-C  [facility] [address] [place], [place] [address] [phone]

## 2019-06-21 ENCOUNTER — Other Ambulatory Visit: Payer: Self-pay

## 2019-06-21 ENCOUNTER — Ambulatory Visit (INDEPENDENT_AMBULATORY_CARE_PROVIDER_SITE_OTHER): Payer: Medicare Other | Admitting: Urology

## 2019-06-21 ENCOUNTER — Encounter: Payer: Self-pay | Admitting: Urology

## 2019-06-21 VITALS — BP 116/74 | HR 76 | Ht 60.0 in | Wt 168.0 lb

## 2019-06-21 DIAGNOSIS — R350 Frequency of micturition: Secondary | ICD-10-CM | POA: Diagnosis not present

## 2019-06-21 DIAGNOSIS — N401 Enlarged prostate with lower urinary tract symptoms: Secondary | ICD-10-CM | POA: Diagnosis not present

## 2019-06-21 DIAGNOSIS — N281 Cyst of kidney, acquired: Secondary | ICD-10-CM

## 2019-06-21 LAB — BLADDER SCAN AMB NON-IMAGING: Scan Result: 17

## 2019-06-21 MED ORDER — OXYBUTYNIN CHLORIDE ER 10 MG PO TB24: 10.0000 mg | ORAL_TABLET | Freq: Every day | ORAL | 6 refills | Status: DC

## 2019-07-05 ENCOUNTER — Other Ambulatory Visit: Payer: Self-pay

## 2019-07-05 ENCOUNTER — Ambulatory Visit (INDEPENDENT_AMBULATORY_CARE_PROVIDER_SITE_OTHER): Payer: Medicare Other | Admitting: Urology

## 2019-07-05 ENCOUNTER — Encounter: Payer: Self-pay | Admitting: Urology

## 2019-07-05 VITALS — BP 148/84 | HR 75 | Ht 60.0 in | Wt 165.0 lb

## 2019-07-05 DIAGNOSIS — N401 Enlarged prostate with lower urinary tract symptoms: Secondary | ICD-10-CM | POA: Diagnosis not present

## 2019-07-05 DIAGNOSIS — N476 Balanoposthitis: Secondary | ICD-10-CM

## 2019-07-05 DIAGNOSIS — R350 Frequency of micturition: Secondary | ICD-10-CM

## 2019-07-05 MED ORDER — NYSTATIN-TRIAMCINOLONE 100000-0.1 UNIT/GM-% EX OINT: 1.0000 "application " | TOPICAL_OINTMENT | Freq: Two times a day (BID) | CUTANEOUS | 0 refills | Status: DC

## 2019-07-05 NOTE — Progress Notes (Signed)
   07/05/19  CC:  Chief Complaint  Patient presents with  . Cysto    HPI: 68 year old male with a history of BPH, urinary urgency who presents today for cystoscopic evaluation.  Please see previous notes from myself and Zara Council.  He is currently on Flomax, finasteride and oxybutynin.  He feels like the oxybutynin has helped significantly with his urinary symptoms.  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  He reports that he had this about 20 years ago when he started to have itching within his foreskin.   Blood pressure (!) 148/84, pulse 75, height 5' (1.524 m), weight 165 lb (74.8 kg). NED. A&Ox3.   No respiratory distress   Abd soft, NT, ND Normal phallus with bilateral descended testicles Foreskin is somewhat difficult to retract fully and slightly inflamed in appearance.  Cystoscopy Procedure Note  Patient identification was confirmed, informed consent was obtained, and patient was prepped using Betadine solution.  Lidocaine jelly was administered per urethral meatus.     Pre-Procedure: - Inspection reveals a normal caliber ureteral meatus.  Procedure: The flexible cystoscope was introduced without difficulty - No urethral strictures/lesions are present. - Normal prostate without significant coaptation, some bleeding with manipulation - Normal bladder neck - Bilateral ureteral orifices identified - Bladder mucosa  reveals no ulcers, tumors, or lesions - No bladder stones - No trabeculation  Retroflexion unremarkable   Post-Procedure: - Patient tolerated the procedure well  Assessment/ Plan:  1. 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 nystatin-triamcinolone ointment (MYCOLOG)    Sig: Apply 1 application topically 2 (two) times daily.    Dispense:  30 g    Refill:  0    Return in about 3 months (around 10/05/2019) for [facility], IPSS/ PVR/ UA.  Hollice Espy, MD

## 2019-07-06 LAB — MICROSCOPIC EXAMINATION
Bacteria, UA: NONE SEEN
RBC: NONE SEEN /hpf (ref 0–2)
WBC, UA: NONE SEEN /hpf (ref 0–5)

## 2019-07-06 LAB — URINALYSIS, COMPLETE
Bilirubin, UA: NEGATIVE
Ketones, UA: NEGATIVE
Leukocytes,UA: NEGATIVE
Nitrite, UA: NEGATIVE
Protein,UA: NEGATIVE
RBC, UA: NEGATIVE
Specific Gravity, UA: 1.02 (ref 1.005–1.030)
Urobilinogen, Ur: 1 mg/dL (ref 0.2–1.0)
pH, UA: 7.5 (ref 5.0–7.5)

## 2019-07-07 ENCOUNTER — Telehealth: Payer: Self-pay | Admitting: *Deleted

## 2019-07-07 NOTE — Telephone Encounter (Signed)
(  KeyLoletta Specter) [id number] Nystatin-Triamcinolone 100000-0.1UNIT/GM-% ointment Status: PA Request  Sent: October 23rd, 2020

## 2019-07-09 ENCOUNTER — Emergency Department
Admission: EM | Admit: 2019-07-09 | Discharge: 2019-07-09 | Disposition: A | Discharge status: Home / Self Care | Payer: Medicare Other | Attending: Emergency Medicine | Admitting: Emergency Medicine

## 2019-07-09 ENCOUNTER — Encounter: Payer: Self-pay | Admitting: Emergency Medicine

## 2019-07-09 ENCOUNTER — Other Ambulatory Visit: Payer: Self-pay

## 2019-07-09 DIAGNOSIS — Z79899 Other long term (current) drug therapy: Secondary | ICD-10-CM | POA: Insufficient documentation

## 2019-07-09 DIAGNOSIS — I10 Essential (primary) hypertension: Secondary | ICD-10-CM | POA: Insufficient documentation

## 2019-07-09 DIAGNOSIS — E039 Hypothyroidism, unspecified: Secondary | ICD-10-CM | POA: Diagnosis not present

## 2019-07-09 DIAGNOSIS — F17220 Nicotine dependence, chewing tobacco, uncomplicated: Secondary | ICD-10-CM | POA: Diagnosis not present

## 2019-07-09 DIAGNOSIS — J45909 Unspecified asthma, uncomplicated: Secondary | ICD-10-CM | POA: Insufficient documentation

## 2019-07-09 DIAGNOSIS — L03113 Cellulitis of right upper limb: Secondary | ICD-10-CM | POA: Insufficient documentation

## 2019-07-09 DIAGNOSIS — J449 Chronic obstructive pulmonary disease, unspecified: Secondary | ICD-10-CM | POA: Diagnosis not present

## 2019-07-09 DIAGNOSIS — M7989 Other specified soft tissue disorders: Secondary | ICD-10-CM | POA: Diagnosis present

## 2019-07-09 LAB — COMPREHENSIVE METABOLIC PANEL
ALT: 75 U/L — ABNORMAL HIGH (ref 0–44)
AST: 49 U/L — ABNORMAL HIGH (ref 15–41)
Albumin: 3.7 g/dL (ref 3.5–5.0)
Alkaline Phosphatase: 37 U/L — ABNORMAL LOW (ref 38–126)
Anion gap: 13 (ref 5–15)
BUN: 12 mg/dL (ref 8–23)
CO2: 23 mmol/L (ref 22–32)
Calcium: 8.8 mg/dL — ABNORMAL LOW (ref 8.9–10.3)
Chloride: 102 mmol/L (ref 98–111)
Creatinine, Ser: 0.72 mg/dL (ref 0.61–1.24)
GFR calc Af Amer: >60 mL/min (ref >60)
GFR calc non Af Amer: >60 mL/min (ref >60)
Glucose, Bld: 130 mg/dL — ABNORMAL HIGH (ref 70–99)
Potassium: 4 mmol/L (ref 3.5–5.1)
Sodium: 138 mmol/L (ref 135–145)
Total Bilirubin: 1.9 mg/dL — ABNORMAL HIGH (ref 0.3–1.2)
Total Protein: 7.6 g/dL (ref 6.5–8.1)

## 2019-07-09 LAB — CBC WITH DIFFERENTIAL/PLATELET
Abs Immature Granulocytes: 0.04 10*3/uL (ref 0.00–0.07)
Basophils Absolute: 0.1 10*3/uL (ref 0.0–0.1)
Basophils Relative: 1 %
Eosinophils Absolute: 0.1 10*3/uL (ref 0.0–0.5)
Eosinophils Relative: 1 %
HCT: 49.4 % (ref 39.0–52.0)
Hemoglobin: 16.7 g/dL (ref 13.0–17.0)
Immature Granulocytes: 1 %
Lymphocytes Relative: 22 %
Lymphs Abs: 1.3 10*3/uL (ref 0.7–4.0)
MCH: 31.3 pg (ref 26.0–34.0)
MCHC: 33.8 g/dL (ref 30.0–36.0)
MCV: 92.5 fL (ref 80.0–100.0)
Monocytes Absolute: 0.7 10*3/uL (ref 0.1–1.0)
Monocytes Relative: 12 %
Neutro Abs: 3.8 10*3/uL (ref 1.7–7.7)
Neutrophils Relative %: 63 %
Platelets: 183 10*3/uL (ref 150–400)
RBC: 5.34 MIL/uL (ref 4.22–5.81)
RDW: 12.7 % (ref 11.5–15.5)
WBC: 6 10*3/uL (ref 4.0–10.5)
nRBC: 0 % (ref 0.0–0.2)

## 2019-07-09 LAB — LACTIC ACID, PLASMA: Lactic Acid, Venous: 1.2 mmol/L (ref 0.5–1.9)

## 2019-07-09 MED ORDER — IBUPROFEN 600 MG PO TABS
600.0000 mg | ORAL_TABLET | ORAL | Status: AC
  Administered 2019-07-09: 600 mg via ORAL
  Filled 2019-07-09: qty 1

## 2019-07-09 MED ORDER — CLINDAMYCIN HCL 300 MG PO CAPS: 300.0000 mg | ORAL_CAPSULE | Freq: Three times a day (TID) | ORAL | 0 refills | Status: DC

## 2019-07-09 MED ORDER — HYDROCODONE-ACETAMINOPHEN 5-325 MG PO TABS: 1.0000 | ORAL_TABLET | Freq: Four times a day (QID) | ORAL | 0 refills | Status: DC | PRN

## 2019-07-09 MED ORDER — CLINDAMYCIN HCL 150 MG PO CAPS
300.0000 mg | ORAL_CAPSULE | Freq: Once | ORAL | Status: AC
  Administered 2019-07-09: 300 mg via ORAL
  Filled 2019-07-09: qty 2

## 2019-07-09 NOTE — ED Notes (Signed)
Patient reports pain and swelling to stump of right arm.  Patient denies any type of injury to area denies every having any problems with the area.  Area noted to have redness and warmth.

## 2019-07-09 NOTE — ED Notes (Signed)
Pt used restroom outside of room, pt didn't want to use the one in the room.

## 2019-07-09 NOTE — ED Triage Notes (Signed)
Patient reports swelling of right arm.  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  Redness and swelling noted.  Patient denies any type of injury to area.

## 2019-07-09 NOTE — Discharge Instructions (Signed)
You have been seen today in the Emergency Department (ED) for cellulitis, a superficial skin infection. Please take your antibiotics as prescribed for their ENTIRE prescribed duration.    No driving while taking hydrocodone.  Please follow up with your doctor or in the ED in 24-48 hours for recheck of your infection if you are not improving.  Call your doctor sooner or return to the ED if you develop worsening signs of infection such as: increased redness, increased pain, pus, fever, or other symptoms that concern you.

## 2019-07-09 NOTE — ED Provider Notes (Signed)
[facility] Emergency Department Provider Note   ____________________________________________   First MD Initiated Contact with Patient 07/09/19 [phone]     (approximate)  I have reviewed the triage vital signs and the nursing notes.   HISTORY  Chief Complaint Swelling of Arm    HPI Mathew Aguilar is a 68 y.o. male history of bipolar disorder, restless leg syndrome, previous suicide attempts, and right upper extremity amputation  Patient reports that he started noticing soreness and redness at the end of his right amputation stump yesterday.  The area of redness, it sore and also warm to the touch.  No fevers no chills no other symptoms.  Reports he feels like it could be infection.  He denies taking any medications to alleviate pain or discomfort.  He reports a previous history of alcohol use, does not use alcohol heavily any longer.   Past Medical History:  Diagnosis Date  . Anxiety   . Asthma   . Borderline personality disorder ([place])   . Depression   . Dysrhythmia   . Erectile dysfunction   . Fatty liver   . GERD (gastroesophageal reflux disease)   . Hypertension   . Hypothyroidism   . Pulmonary nodule, right   . Restless leg   . Sleep apnea   . Suicide attempt [facility]     Patient Active Problem List   Diagnosis Date Noted  . COPD (chronic obstructive pulmonary disease) ([place]) 04/03/2016  . Alcohol use disorder, mild, abuse 02/27/2016  . Severe recurrent major depression with psychotic features ([place]) 02/27/2016  . Suicidal ideation 02/03/2016  . GERD (gastroesophageal reflux disease) 02/19/2015  . Essential hypertension 02/18/2015  . Restless legs syndrome 02/18/2015    Past Surgical History:  Procedure Laterality Date  . AMPUTATION ARM    . COLONOSCOPY    . COLONOSCOPY WITH PROPOFOL N/A 08/22/2018   Procedure: COLONOSCOPY WITH PROPOFOL;  Surgeon: Lollie Sails, MD;  Location: [facility] ENDOSCOPY;  Service: Endoscopy;  Laterality:  N/A;    Prior to Admission medications   Medication Sig Start Date End Date Taking? Authorizing Provider  ABILIFY MAINTENA 400 MG SRER Inject 400 mg into the skin every 30 (thirty) days. 11/09/16   [provider]  albuterol (PROVENTIL HFA;VENTOLIN HFA) 108 (90 Base) MCG/ACT inhaler Inhale 2 puffs into the lungs every 6 (six) hours as needed for wheezing or shortness of breath. 12/02/17   Laban Emperor, PA-C  atenolol (TENORMIN) 25 MG tablet Take 25 mg by mouth daily.    [provider]  clindamycin (CLEOCIN) 300 MG capsule Take 1 capsule (300 mg total) by mouth 3 (three) times daily. 07/09/19   Delman Kitten, MD  doxazosin (CARDURA) 8 MG tablet Take 8 mg by mouth daily.    [provider]  DULoxetine (CYMBALTA) 60 MG capsule Take 2 capsules (120 mg total) by mouth daily. Patient taking differently: Take 60 mg by mouth 2 (two) times daily.  04/06/16   [name], MD  finasteride (PROSCAR) 5 MG tablet Take 5 mg by mouth daily.    [provider]  hydrochlorothiazide (MICROZIDE) 12.5 MG capsule Take 1 capsule (12.5 mg total) by mouth daily. 04/06/16   [name], MD  HYDROcodone-acetaminophen (NORCO/VICODIN) 5-325 MG tablet Take 1 tablet by mouth every 6 (six) hours as needed for moderate pain or severe pain. 07/09/19   Delman Kitten, MD  lamoTRIgine (LAMICTAL) 100 MG tablet Take 50-100 mg by mouth daily. Take 50 mg by mouth every morning and 100  mg by mouth at bedtime for two days then take 100 mg by mouth daily thereafter.    [provider]  levocetirizine (XYZAL) 5 MG tablet Take 5 mg by mouth every evening.    [provider]  levothyroxine (SYNTHROID, LEVOTHROID) 75 MCG tablet Take 1 tablet (75 mcg total) by mouth daily before breakfast. 04/06/16   [name], MD  loratadine (CLARITIN) 10 MG tablet Take 1 tablet (10 mg total) by mouth daily. 12/01/16   Eula Listen, MD  Melatonin 3 MG TABS  Take 3 mg by mouth at bedtime as needed.    [provider]  metFORMIN (GLUCOPHAGE) 500 MG tablet Take 1 tablet (500 mg total) by mouth daily. 04/06/16   [name], MD  mirtazapine (REMERON) 15 MG tablet Take 15 mg by mouth at bedtime. 12/07/16   [provider]  nystatin-triamcinolone ointment (MYCOLOG) Apply 1 application topically 2 (two) times daily. 07/05/19   Hollice Espy, MD  omega-3 acid ethyl esters (LOVAZA) 1 g capsule Take 1 g by mouth daily.    [provider]  omeprazole (PRILOSEC) 20 MG capsule Take 20 mg by mouth daily.    [provider]  oxybutynin (DITROPAN-XL) 10 MG 24 hr tablet Take 1 tablet (10 mg total) by mouth daily. 06/21/19   McGowan, Larene Beach A, PA-C  pramipexole (MIRAPEX) 0.125 MG tablet Take 0.125 mg by mouth 2 (two) times daily.    [provider]  primidone (MYSOLINE) 50 MG tablet Take by mouth at bedtime.    [provider]  rOPINIRole (REQUIP) 3 MG tablet Take 1 tablet (3 mg total) by mouth at bedtime. Patient taking differently: Take 6 mg by mouth at bedtime.  04/06/16   [name], MD  temazepam (RESTORIL) 15 MG capsule Take 15 mg by mouth daily. 11/30/16   [provider]    Allergies Patient has no known allergies.  Family History  Family history unknown: Yes    Social History Social History   Tobacco Use  . Smoking status: Never Smoker  . Smokeless tobacco: Current User    Types: Chew  Substance Use Topics  . Alcohol use: Yes    Comment: history of ETOH abuse - sober several months  . Drug use: No    Review of Systems Constitutional: No fever/chills Cardiovascular: Denies chest pain. Respiratory: Denies shortness of breath. Musculoskeletal: Some discomfort rated the end of the right arm stump.  Is been red but not swollen.  No drainage. Skin: Redness about the size of a half dollar at the end of his right stump site. Neurological: Negative for  weakness or numbness.    ____________________________________________   PHYSICAL EXAM:  VITAL SIGNS: ED Triage Vitals  Enc Vitals Group     BP 07/09/19 0239 131/77     Pulse Rate 07/09/19 0239 (!) 115     Resp --      Temp 07/09/19 0239 98.4 F (36.9 C)     Temp Source 07/09/19 0239 Oral     SpO2 07/09/19 0239 94 %     Weight 07/09/19 0239 165 lb (74.8 kg)     Height 07/09/19 0239 5' (1.524 m)     Head Circumference --      Peak Flow --      Pain Score 07/09/19 0246 8     Pain Loc --      Pain Edu? --      Excl. in [place]? --     Constitutional: Alert and  oriented. Well appearing and in no acute distress.  Pleasant, ambulatory no distress. Eyes: Conjunctivae are normal. Head: Atraumatic. Nose: No congestion/rhinnorhea. Mouth/Throat: Mucous membranes are moist. Neck: No stridor.  Cardiovascular: Normal rate, regular rhythm. Grossly normal heart sounds.  Good peripheral circulation. Respiratory: Normal respiratory effort.  No retractions. Lungs CTAB. Musculoskeletal: No lower extremity tenderness nor edema.  Right upper extremity demonstrates erythema redness and some warmth just at the end of his right stump site is about the size of a half dollar.    There is no swelling or fluctuance.  There is no evidence of abscess or mass.  There is no extending redness up into the remainder of the arm, limited to the tip of the stump site and there is no noted skin breakdown. Neurologic:  Normal speech and language. No gross focal neurologic deficits are appreciated.  Skin:  Skin is warm, dry and intact. No rash noted. Psychiatric: Mood and affect are normal. Speech and behavior are normal.  ____________________________________________   LABS (all labs ordered are listed, but only abnormal results are displayed)  Labs Reviewed  COMPREHENSIVE METABOLIC PANEL - Abnormal; Notable for the following components:      Result Value   Glucose, Bld 130 (*)    Calcium 8.8 (*)    AST 49 (*)     ALT 75 (*)    Alkaline Phosphatase 37 (*)    Total Bilirubin 1.9 (*)    All other components within normal limits  CBC WITH DIFFERENTIAL/PLATELET  LACTIC ACID, PLASMA  LACTIC ACID, PLASMA   ____________________________________________  EKG   ____________________________________________  RADIOLOGY   ____________________________________________   PROCEDURES  Procedure(s) performed: None  Procedures  Critical Care performed: No  ____________________________________________   INITIAL IMPRESSION / ASSESSMENT AND PLAN / ED COURSE  Pertinent labs & imaging results that were available during my care of the patient were reviewed by me and considered in my medical decision making (see chart for details).   Redness at right stump site.  Appears likely early infection.  No evidence of complication.  Patient alert well oriented nontoxic in appearance.  Discussed risk benefits of different antibiotic choices with the patient, including the risk of C. difficile associated with clindamycin, patient denies history of anything like that in the past and after discussing risks and benefits of dual antibiotic or potential use of clindamycin we will trial clindamycin.  Patient will return if symptoms are worsening.  Site will be marked with a skin pen and I have follow-up recommended in about 48 hours with careful return precuations.  I will prescribe the patient a narcotic pain medicine due to their condition which I anticipate will cause at least moderate pain short term. I discussed with the patient safe use of narcotic pain medicines, and that they are not to drive, work in dangerous areas, or ever take more than prescribed (no more than 1 pill every 6 hours). We discussed that this is the type of medication that can be  overdosed on and the risks of this type of medicine. Patient is very agreeable to only use as prescribed and to never use more than prescribed.  Return precautions and  treatment recommendations and follow-up discussed with the patient who is agreeable with the plan.  Marquette Saa was evaluated in Emergency Department on 07/09/2019 for the symptoms described in the history of present illness. He was evaluated in the context of the global COVID-19 pandemic, which necessitated consideration that the patient might be at risk  for infection with the SARS-CoV-2 virus that causes COVID-19. Institutional protocols and algorithms that pertain to the evaluation of patients at risk for COVID-19 are in a state of rapid change based on information released by regulatory bodies including the CDC and federal and state organizations. These policies and algorithms were followed during the patient's care in the ED.        ____________________________________________   FINAL CLINICAL IMPRESSION(S) / ED DIAGNOSES  Final diagnoses:  Cellulitis of right upper arm        Note:  This document was prepared using Dragon voice recognition software and may include unintentional dictation errors       Delman Kitten, MD 07/09/19 [phone]

## 2019-07-13 MED ORDER — TRIAMCINOLONE ACETONIDE 0.025 % EX CREA: 1.0000 "application " | TOPICAL_CREAM | Freq: Two times a day (BID) | CUTANEOUS | 0 refills | Status: DC

## 2019-07-13 MED ORDER — NYSTATIN 100000 UNIT/GM EX OINT: 1.0000 "application " | TOPICAL_OINTMENT | Freq: Two times a day (BID) | CUTANEOUS | 0 refills | Status: DC

## 2019-07-13 NOTE — Addendum Note (Signed)
Addended by: Verlene Mayer A on: 07/13/2019 04:13 PM   Modules accepted: Orders

## 2019-07-13 NOTE — Telephone Encounter (Signed)
PA denied-per Dr. Erlene Quan send in Nystatin and Triamcinolone separately.

## 2019-10-11 ENCOUNTER — Encounter: Payer: Self-pay | Admitting: Urology

## 2019-10-11 ENCOUNTER — Other Ambulatory Visit: Payer: Self-pay

## 2019-10-11 ENCOUNTER — Ambulatory Visit (INDEPENDENT_AMBULATORY_CARE_PROVIDER_SITE_OTHER): Payer: Medicare Other | Admitting: Urology

## 2019-10-11 ENCOUNTER — Other Ambulatory Visit: Payer: Self-pay | Admitting: Family Medicine

## 2019-10-11 VITALS — BP 144/78 | HR 82 | Ht 60.0 in | Wt 164.8 lb

## 2019-10-11 DIAGNOSIS — N401 Enlarged prostate with lower urinary tract symptoms: Secondary | ICD-10-CM | POA: Diagnosis not present

## 2019-10-11 DIAGNOSIS — R350 Frequency of micturition: Secondary | ICD-10-CM | POA: Diagnosis not present

## 2019-10-11 LAB — BLADDER SCAN AMB NON-IMAGING: Scan Result: 48

## 2019-10-11 MED ORDER — FINASTERIDE 5 MG PO TABS: 5.0000 mg | ORAL_TABLET | Freq: Every day | ORAL | 3 refills | Status: DC

## 2019-10-11 MED ORDER — MIRABEGRON ER 25 MG PO TB24: 25.0000 mg | ORAL_TABLET | Freq: Every day | ORAL | 0 refills | Status: DC

## 2019-10-11 MED ORDER — DOXAZOSIN MESYLATE 8 MG PO TABS: 8.0000 mg | ORAL_TABLET | Freq: Every day | ORAL | 3 refills | Status: DC

## 2019-10-11 MED ORDER — OXYBUTYNIN CHLORIDE ER 10 MG PO TB24: 10.0000 mg | ORAL_TABLET | Freq: Every day | ORAL | 3 refills | Status: DC

## 2019-10-11 NOTE — Progress Notes (Signed)
10/11/2019 2:00 PM   Mathew Aguilar [birth year] [id number]  Referring provider: Medicine, [facility] [address] [place],  [place] [address]  Chief Complaint  Patient presents with  . Benign Prostatic Hypertrophy    HPI: 69 year old male with a history of a complex right renal cyst and BPH with lower tract urinary symptoms who presents today for follow up.    History of complex right renal cyst RUS 06/2019 Lesion not seen on recent RUS which is reassuring as it has been four years since last imaging study.  No further follow up warranted   BPH WITH LUTS  (prostate and/or bladder) IPSS score:  17/3     PVR: 48 mL   Previous score: 10/1   Previous PVR: 17 mL   Major complaint(s):  Frequency, urgency, nocturia and a weak urinary stream.    Currently taking: doxazosin 8 mg daily, finasteride 5 mg daily and oxybutynin XL 10 mg daily.    Calculated prostate volume in 2016 was 69.8 based on sizes of 5.09 x 5.47 x 4.57 cm on CT scan.   Cysto 06/2019 with Dr. Erlene Quan No significant urinary obstruction visually on cystoscopy.  No underlying pathology including tumors, masses, lesions or stones.     No history of UTIs or gross hematuria.  No history of bladder stones.  UA 10/11/2019 negative.    Most recent PSA 10/27/2018  0.56.  No family history of prostate cancer.    IPSS    Row Name 10/11/19 1500         International Prostate Symptom Score   How often have you had the sensation of not emptying your bladder?  Not at All     How often have you had to urinate less than every two hours?  Almost always     How often have you found you stopped and started again several times when you urinated?  Not at All     How often have you found it difficult to postpone urination?  Almost always     How often have you had a weak urinary stream?  More than half the time     How often have you had to strain to start urination?  Not at All     How many times did you typically  get up at night to urinate?  3 Times     Total IPSS Score  17       Quality of Life due to urinary symptoms   If you were to spend the rest of your life with your urinary condition just the way it is now how would you feel about that?  Mixed        Score:  1-7 Mild 8-19 Moderate 20-35 Severe  PMH: Past Medical History:  Diagnosis Date  . Anxiety   . Asthma   . Borderline personality disorder ([place])   . Depression   . Dysrhythmia   . Erectile dysfunction   . Fatty liver   . GERD (gastroesophageal reflux disease)   . Hypertension   . Hypothyroidism   . Pulmonary nodule, right   . Restless leg   . Sleep apnea   . Suicide attempt [facility])     Surgical History: Past Surgical History:  Procedure Laterality Date  . AMPUTATION ARM    . COLONOSCOPY    . COLONOSCOPY WITH PROPOFOL N/A 08/22/2018   Procedure: COLONOSCOPY WITH PROPOFOL;  Surgeon: Lollie Sails, MD;  Location: [facility] ENDOSCOPY;  Service: Endoscopy;  Laterality: N/A;    Home Medications:  Allergies as of 10/11/2019   No Known Allergies     Medication List       Accurate as of October 11, 2019 11:59 PM. If you have any questions, ask your nurse or doctor.        Abilify Maintena 400 MG Srer injection Generic drug: ARIPiprazole ER Inject 400 mg into the skin every 30 (thirty) days.   albuterol 108 (90 Base) MCG/ACT inhaler Commonly known as: VENTOLIN HFA Inhale 2 puffs into the lungs every 6 (six) hours as needed for wheezing or shortness of breath.   atenolol 25 MG tablet Commonly known as: TENORMIN Take 25 mg by mouth daily.   clindamycin 300 MG capsule Commonly known as: CLEOCIN Take 1 capsule (300 mg total) by mouth 3 (three) times daily.   doxazosin 4 MG tablet Commonly known as: CARDURA Take 4 mg by mouth at bedtime.   doxazosin 8 MG tablet Commonly known as: CARDURA Take 1 tablet (8 mg total) by mouth daily.   DULoxetine 60 MG capsule Commonly known as: CYMBALTA Take 2 capsules (120  mg total) by mouth daily. What changed:   how much to take  when to take this   finasteride 5 MG tablet Commonly known as: PROSCAR Take 1 tablet (5 mg total) by mouth daily.   gabapentin 100 MG capsule Commonly known as: NEURONTIN Take 100 mg by mouth 3 (three) times daily.   hydrochlorothiazide 12.5 MG capsule Commonly known as: MICROZIDE Take 1 capsule (12.5 mg total) by mouth daily.   HYDROcodone-acetaminophen 5-325 MG tablet Commonly known as: NORCO/VICODIN Take 1 tablet by mouth every 6 (six) hours as needed for moderate pain or severe pain.   lamoTRIgine 100 MG tablet Commonly known as: LAMICTAL Take 50-100 mg by mouth daily. Take 50 mg by mouth every morning and 100 mg by mouth at bedtime for two days then take 100 mg by mouth daily thereafter.   levocetirizine 5 MG tablet Commonly known as: XYZAL Take 5 mg by mouth every evening.   levothyroxine 75 MCG tablet Commonly known as: SYNTHROID Take 1 tablet (75 mcg total) by mouth daily before breakfast.   loratadine 10 MG tablet Commonly known as: CLARITIN Take 1 tablet (10 mg total) by mouth daily.   Melatonin 3 MG Tabs Take 3 mg by mouth at bedtime as needed.   metFORMIN 500 MG tablet Commonly known as: GLUCOPHAGE Take 1 tablet (500 mg total) by mouth daily.   mirabegron ER 25 MG Tb24 tablet Commonly known as: MYRBETRIQ Take 1 tablet (25 mg total) by mouth daily. Started by: Zara Council, PA-C   mirtazapine 15 MG tablet Commonly known as: REMERON Take 15 mg by mouth at bedtime.   nystatin ointment Commonly known as: MYCOSTATIN Apply 1 application topically 2 (two) times daily.   nystatin-triamcinolone ointment Commonly known as: MYCOLOG Apply 1 application topically 2 (two) times daily.   omega-3 acid ethyl esters 1 g capsule Commonly known as: LOVAZA Take 1 g by mouth daily.   omeprazole 20 MG capsule Commonly known as: PRILOSEC Take 20 mg by mouth daily.   oxybutynin 10 MG 24 hr  tablet Commonly known as: DITROPAN-XL Take 1 tablet (10 mg total) by mouth daily.   pramipexole 0.125 MG tablet Commonly known as: MIRAPEX Take 0.125 mg by mouth 2 (two) times daily.   primidone 50 MG tablet Commonly known as: MYSOLINE Take by mouth at bedtime.   rOPINIRole 3 MG tablet  Commonly known as: REQUIP Take 1 tablet (3 mg total) by mouth at bedtime. What changed: how much to take   temazepam 15 MG capsule Commonly known as: RESTORIL Take 15 mg by mouth daily.   triamcinolone 0.025 % cream Commonly known as: KENALOG Apply 1 application topically 2 (two) times daily.       Allergies: No Known Allergies  Family History: Family History  Family history unknown: Yes    Social History:  reports that he has never smoked. His smokeless tobacco use includes chew. He reports current alcohol use. He reports that he does not use drugs.  ROS: UROLOGY Frequent Urination?: Yes Hard to postpone urination?: Yes Burning/pain with urination?: No Get up at night to urinate?: Yes Leakage of urine?: No Urine stream starts and stops?: No Trouble starting stream?: No Do you have to strain to urinate?: No Blood in urine?: No Urinary tract infection?: No Sexually transmitted disease?: No Injury to kidneys or bladder?: No Painful intercourse?: No Weak stream?: Yes Erection problems?: No Penile pain?: No  Gastrointestinal Nausea?: No Vomiting?: No Indigestion/heartburn?: No Diarrhea?: No Constipation?: No  Constitutional Fever: No Night sweats?: No Weight loss?: No Fatigue?: No  Skin Skin rash/lesions?: No Itching?: No  Eyes Blurred vision?: No Double vision?: No  Ears/Nose/Throat Sore throat?: No Sinus problems?: No  Hematologic/Lymphatic Swollen glands?: No Easy bruising?: No  Cardiovascular Leg swelling?: No Chest pain?: No  Respiratory Cough?: No Shortness of breath?: No  Endocrine Excessive thirst?: No  Musculoskeletal Back pain?:  No Joint pain?: No  Neurological Headaches?: No Dizziness?: No  Psychologic Depression?: No Anxiety?: No  Physical Exam: BP (!) 144/78   Pulse 82   Ht 5' (1.524 m)   Wt 164 lb 12.8 oz (74.8 kg)   BMI 32.19 kg/m  Constitutional:  Well nourished. Alert and oriented, No acute distress. HEENT: [place] AT, mask in place.  Trachea midline, no masses. Cardiovascular: No clubbing, cyanosis, or edema. Respiratory: Normal respiratory effort, no increased work of breathing. Neurologic: Grossly intact, no focal deficits, moving all 4 extremities. Psychiatric: Normal mood and affect.  Laboratory Data: Lab Results  Component Value Date   WBC 6.0 07/09/2019   HGB 16.7 07/09/2019   HCT 49.4 07/09/2019   MCV 92.5 07/09/2019   PLT 183 07/09/2019    Lab Results  Component Value Date   CREATININE 0.72 07/09/2019   Hemoglobin A1c 6.2 on 10/2018  PSA as above.  Results for orders placed or performed in visit on 10/11/19  Microscopic Examination   URINE  Result Value Ref Range   WBC, UA 0-5 0 - 5 /hpf   RBC None seen 0 - 2 /hpf   Epithelial Cells (non renal) None seen 0 - 10 /hpf   Bacteria, UA None seen None seen/Few  Urinalysis, Complete  Result Value Ref Range   Specific Gravity, UA 1.020 1.005 - 1.030   pH, UA 6.0 5.0 - 7.5   Color, UA Yellow Yellow   Appearance Ur Clear Clear   Leukocytes,UA Negative Negative   Protein,UA Negative Negative/Trace   Glucose, UA Negative Negative   Ketones, UA Negative Negative   RBC, UA Negative Negative   Bilirubin, UA Negative Negative   Urobilinogen, Ur 0.2 0.2 - 1.0 mg/dL   Nitrite, UA Negative Negative   Microscopic Examination See below:   Bladder Scan (Post Void Residual) in office  Result Value Ref Range   Scan Result 48    CLINICAL DATA:  Renal cyst.  EXAM: RENAL /  URINARY TRACT ULTRASOUND COMPLETE  COMPARISON:  CT, 04/11/2015.  FINDINGS: Right Kidney:  Renal measurements: 10.8 x 5.8 x 4.7 cm = volume: 153.6 mL  . Echogenicity within normal limits. No mass or hydronephrosis visualized.  Left Kidney:  Renal measurements: 11.0 x 5.3 x 4.5 cm = volume: 135.9 mL. Echogenicity within normal limits. No mass or hydronephrosis visualized.  Bladder:  Appears normal for degree of bladder distention.  IMPRESSION: 1. Normal renal ultrasound. The lesion noted on the prior CT, seen as a roughly 10 mm hypodense area along the lateral mid right kidney, is not visualized sonographically.   Electronically Signed   By: Lajean Manes M.D.   On: 06/20/2019 08:08  Assessment & Plan:    1. BPH with LUTS IPSS score is 17/3, it is worsening Continue conservative management, avoiding bladder irritants and timed voiding's Most bothersome symptoms is/are frequency, urgency, nocturia and a weak stream Continue doxazosin 8 mg daily, finasteride 5 mg daily and oxybutynin XL 10 mg daily Add Myrbetriq 25 mg daily, # 28 samples BLADDER SCAN AMB NON-IMAGING RTC in 3 weeks for I PSS, PVR, exam and PSA  2. Renal cyst Lesion not seen on recent RUS which is reassuring as it has been four years since last imaging study.  No further follow up warranted   Return in about 3 weeks (around 11/01/2019) for IPSS, PSA, PVR and exam.  Zara Council, [facility]  [facility] [address], [place], [place] [address] [phone]

## 2019-10-13 LAB — URINALYSIS, COMPLETE
Bilirubin, UA: NEGATIVE
Glucose, UA: NEGATIVE
Ketones, UA: NEGATIVE
Leukocytes,UA: NEGATIVE
Nitrite, UA: NEGATIVE
Protein,UA: NEGATIVE
RBC, UA: NEGATIVE
Specific Gravity, UA: 1.02 (ref 1.005–1.030)
Urobilinogen, Ur: 0.2 mg/dL (ref 0.2–1.0)
pH, UA: 6 (ref 5.0–7.5)

## 2019-10-13 LAB — MICROSCOPIC EXAMINATION
Bacteria, UA: NONE SEEN
Epithelial Cells (non renal): NONE SEEN /hpf (ref 0–10)
RBC, Urine: NONE SEEN /hpf (ref 0–2)

## 2019-10-30 ENCOUNTER — Other Ambulatory Visit: Payer: Self-pay

## 2019-10-30 ENCOUNTER — Other Ambulatory Visit: Payer: Medicare Other

## 2019-10-30 DIAGNOSIS — N401 Enlarged prostate with lower urinary tract symptoms: Secondary | ICD-10-CM

## 2019-10-31 LAB — PSA: Prostate Specific Ag, Serum: 2.3 ng/mL (ref 0.0–4.0)

## 2019-11-02 ENCOUNTER — Ambulatory Visit: Payer: Self-pay | Admitting: Urology

## 2019-11-14 ENCOUNTER — Ambulatory Visit (INDEPENDENT_AMBULATORY_CARE_PROVIDER_SITE_OTHER): Payer: Medicare Other | Admitting: Urology

## 2019-11-14 ENCOUNTER — Other Ambulatory Visit: Payer: Self-pay

## 2019-11-14 ENCOUNTER — Encounter: Payer: Self-pay | Admitting: Urology

## 2019-11-14 VITALS — BP 157/86 | HR 76 | Ht 60.0 in | Wt 170.7 lb

## 2019-11-14 DIAGNOSIS — R972 Elevated prostate specific antigen [PSA]: Secondary | ICD-10-CM

## 2019-11-14 DIAGNOSIS — N401 Enlarged prostate with lower urinary tract symptoms: Secondary | ICD-10-CM | POA: Diagnosis not present

## 2019-11-14 DIAGNOSIS — R350 Frequency of micturition: Secondary | ICD-10-CM

## 2019-11-14 LAB — BLADDER SCAN AMB NON-IMAGING: Scan Result: 111

## 2019-11-14 NOTE — Progress Notes (Signed)
11/14/2019 4:00 PM   Mathew Aguilar 1950-10-08 [id number]  Referring provider: Medicine, [facility] [address] [place],  [place] [address]  Chief Complaint  Patient presents with  . Benign Prostatic Hypertrophy    HPI: 69 year old male with a history of a complex right renal cyst and BPH with lower tract urinary symptoms who presents today for follow up after a three week trial of Myrbetriq 25 mg daily.    BPH WITH LUTS  (prostate and/or bladder) IPSS score:  14/2    PVR: 111 mL   Previous score: 17/3   Previous PVR: 48 mL   Major complaint(s): Frequency, urgency, weak stream and nocturia, but he states he feels that has improved since the addition of Myrbetriq 25 mg daily.    Currently taking: doxazosin 8 mg daily, finasteride 5 mg daily, oxybutynin XL 10 mg daily and Myrbetriq 25 mg daily.    Calculated prostate volume in 2016 was 69.8 based on sizes of 5.09 x 5.47 x 4.57 cm on CT scan.   Cysto 06/2019 with Dr. Erlene Quan No significant urinary obstruction visually on cystoscopy.  No underlying pathology including tumors, masses, lesions or stones.     No history of UTIs or gross hematuria.  No history of bladder stones.  UA 10/11/2019 negative.    No family history of prostate cancer.    Of note, his PSA has increased in velocity to 2.3 on 10/30/2019 from 0.56 on 10/27/2018.     IPSS    Row Name 11/14/19 1500         International Prostate Symptom Score   How often have you had the sensation of not emptying your bladder?  Not at All     How often have you had to urinate less than every two hours?  More than half the time     How often have you found you stopped and started again several times when you urinated?  Not at All     How often have you found it difficult to postpone urination?  About half the time     How often have you had a weak urinary stream?  Almost always     How often have you had to strain to start urination?  Not at All     How  many times did you typically get up at night to urinate?  2 Times     Total IPSS Score  14       Quality of Life due to urinary symptoms   If you were to spend the rest of your life with your urinary condition just the way it is now how would you feel about that?  Mostly Satisfied        Score:  1-7 Mild 8-19 Moderate 20-35 Severe  PMH: Past Medical History:  Diagnosis Date  . Anxiety   . Asthma   . Borderline personality disorder ([place])   . Depression   . Dysrhythmia   . Erectile dysfunction   . Fatty liver   . GERD (gastroesophageal reflux disease)   . Hypertension   . Hypothyroidism   . Pulmonary nodule, right   . Restless leg   . Sleep apnea   . Suicide attempt [facility])     Surgical History: Past Surgical History:  Procedure Laterality Date  . AMPUTATION ARM    . COLONOSCOPY    . COLONOSCOPY WITH PROPOFOL N/A 08/22/2018   Procedure: COLONOSCOPY WITH PROPOFOL;  Surgeon: Lollie Sails, MD;  Location: [facility] ENDOSCOPY;  Service: Endoscopy;  Laterality: N/A;    Home Medications:  Allergies as of 11/14/2019   No Known Allergies     Medication List       Accurate as of November 14, 2019  4:00 PM. If you have any questions, ask your nurse or doctor.        Abilify Maintena 400 MG Srer injection Generic drug: ARIPiprazole ER Inject 400 mg into the skin every 30 (thirty) days.   albuterol 108 (90 Base) MCG/ACT inhaler Commonly known as: VENTOLIN HFA Inhale 2 puffs into the lungs every 6 (six) hours as needed for wheezing or shortness of breath.   atenolol 25 MG tablet Commonly known as: TENORMIN Take 25 mg by mouth daily.   clindamycin 300 MG capsule Commonly known as: CLEOCIN Take 1 capsule (300 mg total) by mouth 3 (three) times daily.   doxazosin 4 MG tablet Commonly known as: CARDURA Take 4 mg by mouth at bedtime.   doxazosin 8 MG tablet Commonly known as: CARDURA Take 1 tablet (8 mg total) by mouth daily.   DULoxetine 60 MG capsule Commonly known  as: CYMBALTA Take 2 capsules (120 mg total) by mouth daily. What changed:   how much to take  when to take this   finasteride 5 MG tablet Commonly known as: PROSCAR Take 1 tablet (5 mg total) by mouth daily.   gabapentin 100 MG capsule Commonly known as: NEURONTIN Take 100 mg by mouth 3 (three) times daily.   hydrochlorothiazide 12.5 MG capsule Commonly known as: MICROZIDE Take 1 capsule (12.5 mg total) by mouth daily.   HYDROcodone-acetaminophen 5-325 MG tablet Commonly known as: NORCO/VICODIN Take 1 tablet by mouth every 6 (six) hours as needed for moderate pain or severe pain.   lamoTRIgine 100 MG tablet Commonly known as: LAMICTAL Take 50-100 mg by mouth daily. Take 50 mg by mouth every morning and 100 mg by mouth at bedtime for two days then take 100 mg by mouth daily thereafter.   levocetirizine 5 MG tablet Commonly known as: XYZAL Take 5 mg by mouth every evening.   levothyroxine 75 MCG tablet Commonly known as: SYNTHROID Take 1 tablet (75 mcg total) by mouth daily before breakfast.   loratadine 10 MG tablet Commonly known as: CLARITIN Take 1 tablet (10 mg total) by mouth daily.   Melatonin 3 MG Tabs Take 3 mg by mouth at bedtime as needed.   metFORMIN 500 MG tablet Commonly known as: GLUCOPHAGE Take 1 tablet (500 mg total) by mouth daily.   mirabegron ER 25 MG Tb24 tablet Commonly known as: MYRBETRIQ Take 1 tablet (25 mg total) by mouth daily.   mirtazapine 15 MG tablet Commonly known as: REMERON Take 15 mg by mouth at bedtime.   mirtazapine 30 MG tablet Commonly known as: REMERON Take 30 mg by mouth at bedtime.   nystatin ointment Commonly known as: MYCOSTATIN Apply 1 application topically 2 (two) times daily.   nystatin-triamcinolone ointment Commonly known as: MYCOLOG Apply 1 application topically 2 (two) times daily.   omega-3 acid ethyl esters 1 g capsule Commonly known as: LOVAZA Take 1 g by mouth daily.   omeprazole 20 MG  capsule Commonly known as: PRILOSEC Take 20 mg by mouth daily.   oxybutynin 10 MG 24 hr tablet Commonly known as: DITROPAN-XL Take 1 tablet (10 mg total) by mouth daily.   pramipexole 0.125 MG tablet Commonly known as: MIRAPEX Take 0.125 mg by mouth 2 (two) times daily.  primidone 50 MG tablet Commonly known as: MYSOLINE Take by mouth at bedtime.   rOPINIRole 3 MG tablet Commonly known as: REQUIP Take 1 tablet (3 mg total) by mouth at bedtime. What changed: how much to take   temazepam 15 MG capsule Commonly known as: RESTORIL Take 15 mg by mouth daily.   triamcinolone 0.025 % cream Commonly known as: KENALOG Apply 1 application topically 2 (two) times daily.       Allergies: No Known Allergies  Family History: Family History  Family history unknown: Yes    Social History:  reports that he has never smoked. His smokeless tobacco use includes chew. He reports current alcohol use. He reports that he does not use drugs.  ROS: For pertinent review of systems please refer to history of present illness  Physical Exam: BP (!) 157/86   Pulse 76   Ht 5' (1.524 m)   Wt 170 lb 11.2 oz (77.4 kg)   BMI 33.34 kg/m  Constitutional:  Well nourished. Alert and oriented, No acute distress. HEENT: [place] AT, mask in place.  Trachea midline, no masses. Cardiovascular: No clubbing, cyanosis, or edema. Respiratory: Normal respiratory effort, no increased work of breathing. GI: Abdomen is obese, soft, non tender, non distended, no abdominal masses.  GU: No CVA tenderness.  No bladder fullness or masses.  Patient with uncircumcised phallus.  Foreskin easily retracted  Urethral meatus is patent.  No penile discharge. No penile lesions or rashes. Scrotum without lesions, cysts, rashes and/or edema.  Testicles are located scrotally bilaterally. No masses are appreciated in the testicles. Left and right epididymis are normal. Rectal: Patient with  normal sphincter tone. Anus and perineum  without scarring or rashes. No rectal masses are appreciated. Prostate is approximately 55 grams, could only palpate the apex in the midportion of the gland, no nodules are appreciated. Seminal vesicles could not be palpated.  Skin: No rashes, bruises or suspicious lesions. Lymph: No inguinal adenopathy. Neurologic: Grossly intact, no focal deficits, moving all 3 extremities.  Right arm amputated. Psychiatric: Normal mood and affect.  Laboratory Data: PSA Trend  1.11 on June 28, 2014  1.94 on September 27, 2015  1.61 on July 02, 2018  0.56 on October 27, 2018     Lab Results  Component Value Date   WBC 6.0 07/09/2019   HGB 16.7 07/09/2019   HCT 49.4 07/09/2019   MCV 92.5 07/09/2019   PLT 183 07/09/2019    Lab Results  Component Value Date   CREATININE 0.72 07/09/2019   Hemoglobin A1c 6.2 on 10/2018 I have reviewed the labs.   Pertinent imaging Results for orders placed or performed in visit on 11/14/19  Bladder Scan (Post Void Residual) in office  Result Value Ref Range   Scan Result 111     Assessment & Plan:    1.  Increase in PSA velocity Explained to patient is somewhat concerning that his PSA has increased from 0.56-2.3 while taking finasteride.  This jump in his PSA number may be due to prostate cancer and at this time I have suggested we repeat the PSA to rule out laboratory error.  If the repeated PSA does not return at a decreased value, I would recommend a prostate biopsy.  The procedure is explained and the risks involved, such as blood in urine, blood in stool, blood in semen, infection, urinary retention, and on rare occasions sepsis and death.  Patient understands the risks as explained to him and he wishes to proceed pending  repeated PSA results.    2. BPH with LUTS IPSS score is 14/2, it is improved Continue conservative management, avoiding bladder irritants and timed voiding's Most bothersome symptoms is/are frequency, urgency, nocturia and a weak  stream -he states that these improved with the addition of the Myrbetriq 25 mg daily Continue doxazosin 8 mg daily, finasteride 5 mg daily, oxybutynin XL 10 mg daily and Myrbetriq 25 mg daily -he may choose to discontinue the oxybutynin to see if his bladder symptoms are still at goal BLADDER SCAN AMB NON-IMAGING RTC pending PSA results  3. Renal cyst Lesion not seen on recent RUS which is reassuring as it has been four years since last imaging study.  No further follow up warranted   Return for pending PSA .  Zara Council, PA-C  [facility] [address] [place], [place] [address] [phone]

## 2019-11-15 ENCOUNTER — Other Ambulatory Visit: Payer: Self-pay | Admitting: Family Medicine

## 2019-11-15 ENCOUNTER — Telehealth: Payer: Self-pay | Admitting: Family Medicine

## 2019-11-15 DIAGNOSIS — N401 Enlarged prostate with lower urinary tract symptoms: Secondary | ICD-10-CM

## 2019-11-15 DIAGNOSIS — R350 Frequency of micturition: Secondary | ICD-10-CM

## 2019-11-15 LAB — PSA: Prostate Specific Ag, Serum: 1.2 ng/mL (ref 0.0–4.0)

## 2019-11-15 NOTE — Telephone Encounter (Signed)
-----   Message from Nori Riis, PA-C sent at 11/15/2019  8:27 AM EST ----- Please let Mr. Keays know that his PSA has decreased and we do not need to pursue a prostate biopsy at this time.  I would like to see him in 6 months for a PSA, I PSS and PVR with PSA prior.

## 2019-11-15 NOTE — Telephone Encounter (Signed)
Patient notified and voiced understanding.

## 2019-12-14 ENCOUNTER — Ambulatory Visit: Payer: Medicare Other | Attending: Internal Medicine

## 2019-12-14 DIAGNOSIS — Z23 Encounter for immunization: Secondary | ICD-10-CM

## 2019-12-14 NOTE — Progress Notes (Signed)
   Covid-19 Vaccination Clinic  Name:  Mathew Aguilar    MRN: [id number] DOB: [birth year]  12/14/2019  [name] was observed post Covid-19 immunization for 15 minutes without incident. He was provided with Vaccine Information Sheet and instruction to access the V-Safe system.   [name] was instructed to call 911 with any severe reactions post vaccine: Marland Kitchen Difficulty breathing  . Swelling of face and throat  . A fast heartbeat  . A bad rash all over body  . Dizziness and weakness   Immunizations Administered    Name Date Dose VIS Date Route   Pfizer COVID-19 Vaccine 12/14/2019  8:54 AM 0.3 mL 08/25/2019 Intramuscular   Manufacturer: [place]   Lot: [id number]   [place]: [id number]

## 2020-01-09 ENCOUNTER — Ambulatory Visit: Payer: Medicare Other | Attending: Internal Medicine

## 2020-01-09 ENCOUNTER — Ambulatory Visit: Payer: Self-pay

## 2020-01-09 DIAGNOSIS — Z23 Encounter for immunization: Secondary | ICD-10-CM

## 2020-01-09 NOTE — Progress Notes (Signed)
   Covid-19 Vaccination Clinic  Name:  Mathew Aguilar    MRN: [id number] DOB: 1951-07-13  01/09/2020  Mathew Aguilar was observed post Covid-19 immunization for 15 minutes without incident. He was provided with Vaccine Information Sheet and instruction to access the V-Safe system.   Mathew Aguilar was instructed to call 911 with any severe reactions post vaccine: Marland Kitchen Difficulty breathing  . Swelling of face and throat  . A fast heartbeat  . A bad rash all over body  . Dizziness and weakness   Immunizations Administered    Name Date Dose VIS Date Route   Pfizer COVID-19 Vaccine 01/09/2020  8:55 AM 0.3 mL 11/08/2018 Intramuscular   Manufacturer: [place]   Lot: [id number]   [place]: [id number]

## 2020-05-17 ENCOUNTER — Other Ambulatory Visit: Payer: Self-pay

## 2020-05-17 ENCOUNTER — Other Ambulatory Visit: Payer: Medicare Other

## 2020-05-17 DIAGNOSIS — N401 Enlarged prostate with lower urinary tract symptoms: Secondary | ICD-10-CM

## 2020-05-18 LAB — PSA: Prostate Specific Ag, Serum: 0.6 ng/mL (ref 0.0–4.0)

## 2020-05-21 ENCOUNTER — Ambulatory Visit: Payer: Self-pay | Admitting: Urology

## 2020-05-21 NOTE — Progress Notes (Deleted)
05/22/2020 11:04 PM   Mathew Aguilar [birth year] [id number]  Referring provider: Medicine, [facility] [address] [place],  [place] [address]  No chief complaint on file.   HPI: 69 year old male with a history of a complex right renal cyst, BPH with lower tract urinary symptoms and an increase in PSA velocity who presents today for follow up.      BPH WITH LUTS  (prostate and/or bladder) IPSS score:  ***    PVR: *** mL   Previous score: 14/2   Previous PVR: 111 mL   Major complaint(s): Frequency, urgency, weak stream and nocturia, but he states he feels that has improved since the addition of Myrbetriq 25 mg daily.    Currently taking: doxazosin 8 mg daily, finasteride 5 mg daily, oxybutynin XL 10 mg daily and Myrbetriq 25 mg daily.    Calculated prostate volume in 2016 was 69.8 based on sizes of 5.09 x 5.47 x 4.57 cm on CT scan.   Cysto 06/2019 with Dr. Erlene Quan No significant urinary obstruction visually on cystoscopy.  No underlying pathology including tumors, masses, lesions or stones.     No history of UTIs or gross hematuria.  No history of bladder stones.  UA 10/11/2019 negative.    No family history of prostate cancer.    Component     Latest Ref Rng & Units 10/30/2019 11/14/2019 05/17/2020  Prostate Specific Ag, Serum     0.0 - 4.0 ng/mL 2.3 1.2 0.6       Score:  1-7 Mild 8-19 Moderate 20-35 Severe  PMH: Past Medical History:  Diagnosis Date   Anxiety    Asthma    Borderline personality disorder ([place])    Depression    Dysrhythmia    Erectile dysfunction    Fatty liver    GERD (gastroesophageal reflux disease)    Hypertension    Hypothyroidism    Pulmonary nodule, right    Restless leg    Sleep apnea    Suicide attempt [facility])     Surgical History: Past Surgical History:  Procedure Laterality Date   AMPUTATION ARM     COLONOSCOPY     COLONOSCOPY WITH PROPOFOL N/A 08/22/2018   Procedure: COLONOSCOPY WITH  PROPOFOL;  Surgeon: Lollie Sails, MD;  Location[facility] ENDOSCOPY;  Service: Endoscopy;  Laterality: N/A;    Home Medications:  Allergies as of 05/22/2020   No Known Allergies     Medication List       Accurate as of May 21, 2020 11:04 PM. If you have any questions, ask your nurse or doctor.        Abilify Maintena 400 MG Srer injection Generic drug: ARIPiprazole ER Inject 400 mg into the skin every 30 (thirty) days.   albuterol 108 (90 Base) MCG/ACT inhaler Commonly known as: VENTOLIN HFA Inhale 2 puffs into the lungs every 6 (six) hours as needed for wheezing or shortness of breath.   atenolol 25 MG tablet Commonly known as: TENORMIN Take 25 mg by mouth daily.   clindamycin 300 MG capsule Commonly known as: CLEOCIN Take 1 capsule (300 mg total) by mouth 3 (three) times daily.   doxazosin 4 MG tablet Commonly known as: CARDURA Take 4 mg by mouth at bedtime.   doxazosin 8 MG tablet Commonly known as: CARDURA Take 1 tablet (8 mg total) by mouth daily.   DULoxetine 60 MG capsule Commonly known as: CYMBALTA Take 2 capsules (120 mg total) by mouth daily. What changed:   how  much to take  when to take this   finasteride 5 MG tablet Commonly known as: PROSCAR Take 1 tablet (5 mg total) by mouth daily.   gabapentin 100 MG capsule Commonly known as: NEURONTIN Take 100 mg by mouth 3 (three) times daily.   hydrochlorothiazide 12.5 MG capsule Commonly known as: MICROZIDE Take 1 capsule (12.5 mg total) by mouth daily.   HYDROcodone-acetaminophen 5-325 MG tablet Commonly known as: NORCO/VICODIN Take 1 tablet by mouth every 6 (six) hours as needed for moderate pain or severe pain.   lamoTRIgine 100 MG tablet Commonly known as: LAMICTAL Take 50-100 mg by mouth daily. Take 50 mg by mouth every morning and 100 mg by mouth at bedtime for two days then take 100 mg by mouth daily thereafter.   levocetirizine 5 MG tablet Commonly known as: XYZAL Take 5 mg by  mouth every evening.   levothyroxine 75 MCG tablet Commonly known as: SYNTHROID Take 1 tablet (75 mcg total) by mouth daily before breakfast.   loratadine 10 MG tablet Commonly known as: CLARITIN Take 1 tablet (10 mg total) by mouth daily.   melatonin 3 MG Tabs tablet Take 3 mg by mouth at bedtime as needed.   metFORMIN 500 MG tablet Commonly known as: GLUCOPHAGE Take 1 tablet (500 mg total) by mouth daily.   mirabegron ER 25 MG Tb24 tablet Commonly known as: MYRBETRIQ Take 1 tablet (25 mg total) by mouth daily.   mirtazapine 15 MG tablet Commonly known as: REMERON Take 15 mg by mouth at bedtime.   mirtazapine 30 MG tablet Commonly known as: REMERON Take 30 mg by mouth at bedtime.   nystatin ointment Commonly known as: MYCOSTATIN Apply 1 application topically 2 (two) times daily.   nystatin-triamcinolone ointment Commonly known as: MYCOLOG Apply 1 application topically 2 (two) times daily.   omega-3 acid ethyl esters 1 g capsule Commonly known as: LOVAZA Take 1 g by mouth daily.   omeprazole 20 MG capsule Commonly known as: PRILOSEC Take 20 mg by mouth daily.   oxybutynin 10 MG 24 hr tablet Commonly known as: DITROPAN-XL Take 1 tablet (10 mg total) by mouth daily.   pramipexole 0.125 MG tablet Commonly known as: MIRAPEX Take 0.125 mg by mouth 2 (two) times daily.   primidone 50 MG tablet Commonly known as: MYSOLINE Take by mouth at bedtime.   rOPINIRole 3 MG tablet Commonly known as: REQUIP Take 1 tablet (3 mg total) by mouth at bedtime. What changed: how much to take   temazepam 15 MG capsule Commonly known as: RESTORIL Take 15 mg by mouth daily.   triamcinolone 0.025 % cream Commonly known as: KENALOG Apply 1 application topically 2 (two) times daily.       Allergies: No Known Allergies  Family History: Family History  Family history unknown: Yes    Social History:  reports that he has never smoked. His smokeless tobacco use includes  chew. He reports current alcohol use. He reports that he does not use drugs.  ROS: For pertinent review of systems please refer to history of present illness  Physical Exam: There were no vitals taken for this visit. Constitutional:  Well nourished. Alert and oriented, No acute distress. HEENT: [place] AT, moist mucus membranes.  Trachea midline Cardiovascular: No clubbing, cyanosis, or edema. Respiratory: Normal respiratory effort, no increased work of breathing. GI: Abdomen is soft, non tender, non distended, no abdominal masses. Liver and spleen not palpable.  No hernias appreciated.  Stool sample for occult testing  is not indicated.   GU: No CVA tenderness.  No bladder fullness or masses.  Patient with circumcised/uncircumcised phallus. ***Foreskin easily retracted***  Urethral meatus is patent.  No penile discharge. No penile lesions or rashes. Scrotum without lesions, cysts, rashes and/or edema.  Testicles are located scrotally bilaterally. No masses are appreciated in the testicles. Left and right epididymis are normal. Rectal: Patient with  normal sphincter tone. Anus and perineum without scarring or rashes. No rectal masses are appreciated. Prostate is approximately *** grams, *** nodules are appreciated. Seminal vesicles are normal. Skin: No rashes, bruises or suspicious lesions. Lymph: No inguinal adenopathy. Neurologic: Grossly intact, no focal deficits, moving all 4 extremities. Psychiatric: Normal mood and affect.  Laboratory Data: PSA Trend  1.11 on June 28, 2014  1.94 on September 27, 2015  1.61 on July 02, 2018  0.56 on October 27, 2018     Lab Results  Component Value Date   WBC 6.0 07/09/2019   HGB 16.7 07/09/2019   HCT 49.4 07/09/2019   MCV 92.5 07/09/2019   PLT 183 07/09/2019    Lab Results  Component Value Date   CREATININE 0.72 07/09/2019   Hemoglobin A1c 6.2 on 10/2018  Results for orders placed or performed in visit on 05/17/20  PSA  Result Value  Ref Range   Prostate Specific Ag, Serum 0.6 0.0 - 4.0 ng/mL  I have reviewed the labs.  Assessment & Plan:    1.  Increase in PSA velocity Resolved  2. BPH with LUTS IPSS score is 14/2, it is improved Continue conservative management, avoiding bladder irritants and timed voiding's Most bothersome symptoms is/are frequency, urgency, nocturia and a weak stream -he states that these improved with the addition of the Myrbetriq 25 mg daily Continue doxazosin 8 mg daily, finasteride 5 mg daily, oxybutynin XL 10 mg daily and Myrbetriq 25 mg daily -he may choose to discontinue the oxybutynin to see if his bladder symptoms are still at goal BLADDER SCAN AMB NON-IMAGING RTC pending PSA results  3. Renal cyst Lesion not seen on recent RUS which is reassuring as it has been four years since last imaging study.  No further follow up warranted   No follow-ups on file.  Zara Council, PA-C  [facility] [address], [place], [place] [address] [phone]

## 2020-05-22 ENCOUNTER — Encounter: Payer: Self-pay | Admitting: Urology

## 2020-05-22 ENCOUNTER — Ambulatory Visit: Payer: Self-pay | Admitting: Urology

## 2020-05-30 DIAGNOSIS — S48911A Complete traumatic amputation of right shoulder and upper arm, level unspecified, initial encounter: Secondary | ICD-10-CM | POA: Insufficient documentation

## 2020-05-30 DIAGNOSIS — Z9151 Personal history of suicidal behavior: Secondary | ICD-10-CM | POA: Insufficient documentation

## 2020-05-30 DIAGNOSIS — E039 Hypothyroidism, unspecified: Secondary | ICD-10-CM | POA: Insufficient documentation

## 2020-05-30 DIAGNOSIS — Z8659 Personal history of other mental and behavioral disorders: Secondary | ICD-10-CM | POA: Insufficient documentation

## 2020-05-30 DIAGNOSIS — N529 Male erectile dysfunction, unspecified: Secondary | ICD-10-CM | POA: Insufficient documentation

## 2020-05-30 DIAGNOSIS — F419 Anxiety disorder, unspecified: Secondary | ICD-10-CM | POA: Insufficient documentation

## 2020-05-30 NOTE — Progress Notes (Signed)
05/31/2020 9:14 AM   Mathew Aguilar May 17, 1951 [id number]  Referring provider: Medicine, [facility] [address] [place],  [place] [address]  Chief Complaint  Patient presents with  . Benign Prostatic Hypertrophy    HPI: 69 year old male with a history of a complex right renal cyst, BPH with lower tract urinary symptoms and an increase in PSA velocity who presents today for follow up.      BPH WITH LUTS  (prostate and/or bladder) IPSS score:  22/5    PVR: 82 mL   Previous score: 14/2   Previous PVR: 111 mL   Major complaint(s): Urge incontinence.  It is worse when walking.  He is drinking some water and diet Pepsi. Currently taking: doxazosin 8 mg daily, finasteride 5 mg daily, oxybutynin XL 10 mg daily and Myrbetriq 25 mg daily.    Patient denies any modifying or aggravating factors.  Patient denies any gross hematuria, dysuria or suprapubic/flank pain.  Patient denies any fevers, chills, nausea or vomiting.   Calculated prostate volume in 2016 was 69.8 based on sizes of 5.09 x 5.47 x 4.57 cm on CT scan.   Cysto 06/2019 with Dr. Erlene Quan No significant urinary obstruction visually on cystoscopy.  No underlying pathology including tumors, masses, lesions or stones.     No history of UTIs or gross hematuria.  No history of bladder stones.  UA 10/11/2019 negative.    No family history of prostate cancer.    Component     Latest Ref Rng & Units 10/30/2019 11/14/2019 05/17/2020  Prostate Specific Ag, Serum     0.0 - 4.0 ng/mL 2.3 1.2 0.6      IPSS    Row Name 05/31/20 0800         International Prostate Symptom Score   How often have you had the sensation of not emptying your bladder? Almost always     How often have you had to urinate less than every two hours? About half the time     How often have you found you stopped and started again several times when you urinated? Not at All     How often have you found it difficult to postpone urination? Almost  always     How often have you had a weak urinary stream? Almost always     How often have you had to strain to start urination? Not at All     How many times did you typically get up at night to urinate? 4 Times     Total IPSS Score 22       Quality of Life due to urinary symptoms   If you were to spend the rest of your life with your urinary condition just the way it is now how would you feel about that? Unhappy            Score:  1-7 Mild 8-19 Moderate 20-35 Severe  PMH: Past Medical History:  Diagnosis Date  . Anxiety   . Asthma   . Borderline personality disorder ([place])   . Depression   . Dysrhythmia   . Erectile dysfunction   . Fatty liver   . GERD (gastroesophageal reflux disease)   . Hypertension   . Hypothyroidism   . Pulmonary nodule, right   . Restless leg   . Sleep apnea   . Suicide attempt [facility])     Surgical History: Past Surgical History:  Procedure Laterality Date  . AMPUTATION ARM    . COLONOSCOPY    .  COLONOSCOPY WITH PROPOFOL N/A 08/22/2018   Procedure: COLONOSCOPY WITH PROPOFOL;  Surgeon: Lollie Sails, MD;  Location: [facility] ENDOSCOPY;  Service: Endoscopy;  Laterality: N/A;    Home Medications:  Allergies as of 05/31/2020   No Known Allergies     Medication List       Accurate as of May 31, 2020  9:14 AM. If you have any questions, ask your nurse or doctor.        STOP taking these medications   clindamycin 300 MG capsule Commonly known as: CLEOCIN Stopped by: Zara Council, PA-C   mirabegron ER 25 MG Tb24 tablet Commonly known as: MYRBETRIQ Stopped by: Zara Council, PA-C     TAKE these medications   Abilify Maintena 400 MG Srer injection Generic drug: ARIPiprazole ER Inject 400 mg into the skin every 30 (thirty) days.   albuterol 108 (90 Base) MCG/ACT inhaler Commonly known as: VENTOLIN HFA Inhale 2 puffs into the lungs every 6 (six) hours as needed for wheezing or shortness of breath.   atenolol 25 MG  tablet Commonly known as: TENORMIN Take 25 mg by mouth daily.   doxazosin 8 MG tablet Commonly known as: CARDURA Take 1 tablet (8 mg total) by mouth daily. What changed: Another medication with the same name was removed. Continue taking this medication, and follow the directions you see here. Changed by: Zara Council, PA-C   DULoxetine 60 MG capsule Commonly known as: CYMBALTA Take 2 capsules (120 mg total) by mouth daily. What changed:   how much to take  when to take this   finasteride 5 MG tablet Commonly known as: PROSCAR Take 1 tablet (5 mg total) by mouth daily.   gabapentin 100 MG capsule Commonly known as: NEURONTIN Take 100 mg by mouth 3 (three) times daily.   Gemtesa 75 MG Tabs Generic drug: Vibegron Take 75 mg by mouth daily. Started by: Zara Council, PA-C   hydrochlorothiazide 12.5 MG capsule Commonly known as: MICROZIDE Take 1 capsule (12.5 mg total) by mouth daily.   HYDROcodone-acetaminophen 5-325 MG tablet Commonly known as: NORCO/VICODIN Take 1 tablet by mouth every 6 (six) hours as needed for moderate pain or severe pain.   lamoTRIgine 100 MG tablet Commonly known as: LAMICTAL Take 50-100 mg by mouth daily. Take 50 mg by mouth every morning and 100 mg by mouth at bedtime for two days then take 100 mg by mouth daily thereafter.   levocetirizine 5 MG tablet Commonly known as: XYZAL Take 5 mg by mouth every evening.   levothyroxine 75 MCG tablet Commonly known as: SYNTHROID Take 1 tablet (75 mcg total) by mouth daily before breakfast.   loratadine 10 MG tablet Commonly known as: CLARITIN Take 1 tablet (10 mg total) by mouth daily.   melatonin 3 MG Tabs tablet Take 3 mg by mouth at bedtime as needed.   metFORMIN 500 MG tablet Commonly known as: GLUCOPHAGE Take 1 tablet (500 mg total) by mouth daily.   mirtazapine 15 MG tablet Commonly known as: REMERON Take 15 mg by mouth at bedtime.   mirtazapine 30 MG tablet Commonly known as:  REMERON Take 30 mg by mouth at bedtime.   nystatin ointment Commonly known as: MYCOSTATIN Apply 1 application topically 2 (two) times daily.   nystatin-triamcinolone ointment Commonly known as: MYCOLOG Apply 1 application topically 2 (two) times daily.   omega-3 acid ethyl esters 1 g capsule Commonly known as: LOVAZA Take 1 g by mouth daily.   omeprazole 20 MG capsule Commonly  known as: PRILOSEC Take 20 mg by mouth daily.   oxybutynin 10 MG 24 hr tablet Commonly known as: DITROPAN-XL Take 1 tablet (10 mg total) by mouth daily.   pramipexole 1 MG tablet Commonly known as: MIRAPEX Take by mouth. What changed: Another medication with the same name was removed. Continue taking this medication, and follow the directions you see here. Changed by: Zara Council, PA-C   primidone 50 MG tablet Commonly known as: MYSOLINE Take by mouth at bedtime.   rOPINIRole 3 MG tablet Commonly known as: REQUIP Take 1 tablet (3 mg total) by mouth at bedtime. What changed: how much to take   rosuvastatin 10 MG tablet Commonly known as: CRESTOR Take 10 mg by mouth at bedtime.   tadalafil 20 MG tablet Commonly known as: CIALIS Take by mouth.   temazepam 15 MG capsule Commonly known as: RESTORIL Take 15 mg by mouth daily.   triamcinolone 0.025 % cream Commonly known as: KENALOG Apply 1 application topically 2 (two) times daily.       Allergies: No Known Allergies  Family History: Family History  Family history unknown: Yes    Social History:  reports that he has never smoked. His smokeless tobacco use includes chew. He reports current alcohol use. He reports that he does not use drugs.  ROS: For pertinent review of systems please refer to history of present illness  Physical Exam: BP (!) 145/80   Pulse 89   Ht 5' (1.524 m)   Wt 175 lb (79.4 kg)   BMI 34.18 kg/m  Constitutional:  Well nourished. Alert and oriented, No acute distress. HEENT: [place] AT, mask in place.   Trachea midline Cardiovascular: No clubbing, cyanosis, or edema. Respiratory: Normal respiratory effort, no increased work of breathing. GU: No CVA tenderness.  No bladder fullness or masses.  Patient with uncircumcised phallus.  Foreskin easily retracted  Urethral meatus is patent.  No penile discharge. No penile lesions or rashes. Scrotum without lesions, cysts, rashes and/or edema.  Testicles are located scrotally bilaterally. No masses are appreciated in the testicles. Left and right epididymis are normal. Rectal: Patient with  normal sphincter tone. Anus and perineum without scarring or rashes. No rectal masses are appreciated. Prostate is approximately 50 grams, could only palpate the apex and the midportion of the gland, no nodules are appreciated. Seminal vesicles could not be palpated.  Skin: No rashes, bruises or suspicious lesions. Neurologic: Grossly intact, no focal deficits, moving all 4 extremities. Psychiatric: Normal mood and affect.  Laboratory Data: PSA Trend  1.11 on June 28, 2014  1.94 on September 27, 2015  1.61 on July 02, 2018  0.56 on October 27, 2018     Lab Results  Component Value Date   WBC 6.0 07/09/2019   HGB 16.7 07/09/2019   HCT 49.4 07/09/2019   MCV 92.5 07/09/2019   PLT 183 07/09/2019    Lab Results  Component Value Date   CREATININE 0.72 07/09/2019   Hemoglobin A1c 6.2 on 10/2018  Results for orders placed or performed in visit on 05/31/20  BLADDER SCAN AMB NON-IMAGING  Result Value Ref Range   Scan Result 82 ML   I have reviewed the labs.  Assessment & Plan:    1. BPH with LUTS IPSS score is 22/5, it is worsened Continue conservative management, avoiding bladder irritants and timed voiding's -avoid diet Pepsi Most bothersome symptoms is/are urge incontinence Continue doxazosin 8 mg daily, finasteride 5 mg daily, oxybutynin XL 10 mg daily  We discussed PTNS, Botox and InterStim, but patient deferred I discussed pelvic floor PT as  he states it is worse when walking, he would like a referral  We will also discontinue the Myrbetriq and have him take Gemtesa in its place to see if this is more effective Gemtesa 75 mg, #28 samples given BLADDER SCAN AMB NON-IMAGING RTC in 3 weeks for I PSS and PVR  2. Urge incontinence   Return in about 3 weeks (around 06/21/2020) for IPSS and PVR.  Zara Council, PA-C  [facility] [address] [place], [place] [address] [phone]

## 2020-05-31 ENCOUNTER — Encounter: Payer: Self-pay | Admitting: Urology

## 2020-05-31 ENCOUNTER — Ambulatory Visit (INDEPENDENT_AMBULATORY_CARE_PROVIDER_SITE_OTHER): Payer: Medicare Other | Admitting: Urology

## 2020-05-31 ENCOUNTER — Other Ambulatory Visit: Payer: Self-pay

## 2020-05-31 VITALS — BP 145/80 | HR 89 | Ht 60.0 in | Wt 175.0 lb

## 2020-05-31 DIAGNOSIS — R350 Frequency of micturition: Secondary | ICD-10-CM | POA: Diagnosis not present

## 2020-05-31 DIAGNOSIS — N401 Enlarged prostate with lower urinary tract symptoms: Secondary | ICD-10-CM | POA: Diagnosis not present

## 2020-05-31 DIAGNOSIS — N3941 Urge incontinence: Secondary | ICD-10-CM | POA: Diagnosis not present

## 2020-05-31 LAB — BLADDER SCAN AMB NON-IMAGING: Scan Result: 82

## 2020-05-31 MED ORDER — GEMTESA 75 MG PO TABS: 75.0000 mg | ORAL_TABLET | Freq: Every day | ORAL | 0 refills | Status: DC

## 2020-05-31 NOTE — Patient Instructions (Signed)
Stop the Myrbetriq and take the Gemtesa in its place

## 2020-06-28 ENCOUNTER — Ambulatory Visit: Payer: Self-pay | Admitting: Urology

## 2020-07-02 NOTE — Progress Notes (Signed)
07/03/2020 9:36 AM   Mathew Aguilar [birth year] [id number]  Referring provider: Medicine, [facility] [address] [place],  [place] [address]  Chief Complaint  Patient presents with  . Benign Prostatic Hypertrophy    HPI: 69 year old male with a BPH with lower tract urinary symptoms who presents today for follow up.      BPH WITH LUTS  (prostate and/or bladder) IPSS score:  17/3   PVR: 25 mL   Previous score: 22/5   Previous PVR: 82 mL   Major complaint(s): Urgency.      Currently taking: doxazosin 8 mg daily, finasteride 5 mg daily, oxybutynin XL 10 mg daily and Gemtesa 75 mg.    He feels that [place] allows his bladder to empty more completely.  Patient denies any modifying or aggravating factors.  Patient denies any gross hematuria, dysuria or suprapubic/flank pain.  Patient denies any fevers, chills, nausea or vomiting.  Calculated prostate volume in 2016 was 69.8 based on sizes of 5.09 x 5.47 x 4.57 cm on CT scan.   Cysto 06/2019 with Dr. Erlene Quan No significant urinary obstruction visually on cystoscopy.  No underlying pathology including tumors, masses, lesions or stones.     No history of UTIs or gross hematuria.  No history of bladder stones.  UA 10/11/2019 negative.    No family history of prostate cancer.       IPSS    Row Name 07/03/20 0900         International Prostate Symptom Score   How often have you had the sensation of not emptying your bladder? Less than half the time     How often have you had to urinate less than every two hours? About half the time     How often have you found you stopped and started again several times when you urinated? Not at All     How often have you found it difficult to postpone urination? More than half the time     How often have you had a weak urinary stream? Almost always     How often have you had to strain to start urination? Not at All     How many times did you typically get up at night to  urinate? 3 Times     Total IPSS Score 17       Quality of Life due to urinary symptoms   If you were to spend the rest of your life with your urinary condition just the way it is now how would you feel about that? Mixed            Score:  1-7 Mild 8-19 Moderate 20-35 Severe  PMH: Past Medical History:  Diagnosis Date  . Anxiety   . Asthma   . Borderline personality disorder ([place])   . Depression   . Dysrhythmia   . Erectile dysfunction   . Fatty liver   . GERD (gastroesophageal reflux disease)   . Hypertension   . Hypothyroidism   . Pulmonary nodule, right   . Restless leg   . Sleep apnea   . Suicide attempt [facility])     Surgical History: Past Surgical History:  Procedure Laterality Date  . AMPUTATION ARM    . COLONOSCOPY    . COLONOSCOPY WITH PROPOFOL N/A 08/22/2018   Procedure: COLONOSCOPY WITH PROPOFOL;  Surgeon: Lollie Sails, MD;  Location:  ENDOSCOPY;  Service: Endoscopy;  Laterality: N/A;    Home Medications:  Allergies as of 07/03/2020  No Known Allergies     Medication List       Accurate as of July 03, 2020  9:36 AM. If you have any questions, ask your nurse or doctor.        Abilify Maintena 400 MG Srer injection Generic drug: ARIPiprazole ER Inject 400 mg into the skin every 30 (thirty) days.   albuterol 108 (90 Base) MCG/ACT inhaler Commonly known as: VENTOLIN HFA Inhale 2 puffs into the lungs every 6 (six) hours as needed for wheezing or shortness of breath.   atenolol 25 MG tablet Commonly known as: TENORMIN Take 25 mg by mouth daily.   doxazosin 8 MG tablet Commonly known as: CARDURA Take 1 tablet (8 mg total) by mouth daily.   DULoxetine 60 MG capsule Commonly known as: CYMBALTA Take 2 capsules (120 mg total) by mouth daily. What changed:   how much to take  when to take this   finasteride 5 MG tablet Commonly known as: PROSCAR Take 1 tablet (5 mg total) by mouth daily.   gabapentin 100 MG capsule Commonly  known as: NEURONTIN Take 100 mg by mouth 3 (three) times daily.   Gemtesa 75 MG Tabs Generic drug: Vibegron Take 75 mg by mouth daily. What changed: Another medication with the same name was added. Make sure you understand how and when to take each. Changed by: Zara Council, PA-C   Gemtesa 75 MG Tabs Generic drug: Vibegron Take 75 mg by mouth daily. What changed: You were already taking a medication with the same name, and this prescription was added. Make sure you understand how and when to take each. Changed by: Zara Council, PA-C   hydrochlorothiazide 12.5 MG capsule Commonly known as: MICROZIDE Take 1 capsule (12.5 mg total) by mouth daily.   HYDROcodone-acetaminophen 5-325 MG tablet Commonly known as: NORCO/VICODIN Take 1 tablet by mouth every 6 (six) hours as needed for moderate pain or severe pain.   lamoTRIgine 100 MG tablet Commonly known as: LAMICTAL Take 50-100 mg by mouth daily. Take 50 mg by mouth every morning and 100 mg by mouth at bedtime for two days then take 100 mg by mouth daily thereafter.   levocetirizine 5 MG tablet Commonly known as: XYZAL Take 5 mg by mouth every evening.   levothyroxine 75 MCG tablet Commonly known as: SYNTHROID Take 1 tablet (75 mcg total) by mouth daily before breakfast.   loratadine 10 MG tablet Commonly known as: CLARITIN Take 1 tablet (10 mg total) by mouth daily.   melatonin 3 MG Tabs tablet Take 3 mg by mouth at bedtime as needed.   metFORMIN 500 MG tablet Commonly known as: GLUCOPHAGE Take 1 tablet (500 mg total) by mouth daily.   mirtazapine 15 MG tablet Commonly known as: REMERON Take 15 mg by mouth at bedtime.   mirtazapine 30 MG tablet Commonly known as: REMERON Take 30 mg by mouth at bedtime.   nystatin ointment Commonly known as: MYCOSTATIN Apply 1 application topically 2 (two) times daily.   nystatin-triamcinolone ointment Commonly known as: MYCOLOG Apply 1 application topically 2 (two) times  daily.   omega-3 acid ethyl esters 1 g capsule Commonly known as: LOVAZA Take 1 g by mouth daily.   omeprazole 20 MG capsule Commonly known as: PRILOSEC Take 20 mg by mouth daily.   oxybutynin 10 MG 24 hr tablet Commonly known as: DITROPAN-XL Take 1 tablet (10 mg total) by mouth daily.   pramipexole 1 MG tablet Commonly known as: MIRAPEX Take by mouth.  primidone 50 MG tablet Commonly known as: MYSOLINE Take by mouth at bedtime.   rOPINIRole 3 MG tablet Commonly known as: REQUIP Take 1 tablet (3 mg total) by mouth at bedtime. What changed: how much to take   rosuvastatin 10 MG tablet Commonly known as: CRESTOR Take 10 mg by mouth at bedtime.   tadalafil 20 MG tablet Commonly known as: CIALIS Take by mouth.   temazepam 15 MG capsule Commonly known as: RESTORIL Take 15 mg by mouth daily.   triamcinolone 0.025 % cream Commonly known as: KENALOG Apply 1 application topically 2 (two) times daily.       Allergies: No Known Allergies  Family History: Family History  Family history unknown: Yes    Social History:  reports that he has never smoked. His smokeless tobacco use includes chew. He reports current alcohol use. He reports that he does not use drugs.  ROS: For pertinent review of systems please refer to history of present illness  Physical Exam: BP 131/82   Pulse 69   Ht 5' (1.524 m)   Wt 163 lb 8 oz (74.2 kg)   BMI 31.93 kg/m  Constitutional:  Well nourished. Alert and oriented, No acute distress. HEENT: [place] AT, mask in place.  Trachea midline Cardiovascular: No clubbing, cyanosis, or edema. Respiratory: Normal respiratory effort, no increased work of breathing. Neurologic: Grossly intact, no focal deficits, moving all 3 extremities. Psychiatric: Normal mood and affect.  Laboratory Data: PSA Trend  1.11 on June 28, 2014  1.94 on September 27, 2015  1.61 on July 02, 2018  0.56 on October 27, 2018   Component     Latest Ref Rng &  Units 10/30/2019 11/14/2019 05/17/2020  Prostate Specific Ag, Serum     0.0 - 4.0 ng/mL 2.3 1.2 0.6    Specimen:  Blood  Ref Range & Units 8 mo ago  Hemoglobin A1C 4.2 - 5.6 % 6.6High      Average Blood Glucose (Calc) mg/dL [facility]  Narrative Performed by [facility] - LAB Normal Range:  4.2 - 5.6%  Increased Risk: 5.7 - 6.4%  Diabetes:    >= 6.5%  Glycemic Control for adults with diabetes: <7%  Specimen Collected: 10/31/19 8:43 AM Last Resulted: 10/31/19 10:26 AM  Received From: [facility]  Result Received: 11/05/19 6:34 PM      Lab Results  Component Value Date   WBC 6.0 07/09/2019   HGB 16.7 07/09/2019   HCT 49.4 07/09/2019   MCV 92.5 07/09/2019   PLT 183 07/09/2019    Lab Results  Component Value Date   CREATININE 0.72 07/09/2019   Urinalysis Component     Latest Ref Rng & Units 10/11/2019  Specific Gravity, UA     1.005 - 1.030 1.020  pH, UA     5.0 - 7.5 6.0  Color, UA     Yellow Yellow  Appearance Ur     Clear Clear  Leukocytes,UA     Negative Negative  Protein,UA     Negative/Trace Negative  Glucose, UA     Negative Negative  Ketones, UA     Negative Negative  RBC, UA     Negative Negative  Bilirubin, UA     Negative Negative  Urobilinogen, Ur     0.2 - 1.0 mg/dL 0.2  Nitrite, UA     Negative Negative  Microscopic Examination  See below:   Component     Latest Ref Rng & Units 10/11/2019  WBC, UA     0 - 5 /hpf 0-5  RBC     0 - 2 /hpf None seen  Epithelial Cells (non renal)     0 - 10 /hpf None seen  Bacteria, UA     None seen/Few None seen   Specimen:  Blood  Ref Range & Units 8 mo ago  Glucose 70 - 110 mg/dL 156High      Sodium 136 - 145 mmol/L 139      Potassium 3.6 - 5.1 mmol/L 4.5      Chloride 97 - 109 mmol/L 102      Carbon Dioxide (CO2) 22.0 - 32.0 mmol/L 31.0      Urea Nitrogen (BUN) 7 - 25 mg/dL 17      Creatinine 0.7 - 1.3  mg/dL 0.8      Glomerular Filtration Rate (eGFR), MDRD Estimate >60 mL/min/1.73sq m 96      Calcium 8.7 - 10.3 mg/dL 9.5      AST  8 - 39 U/L 48High      ALT  6 - 57 U/L 88High      Alk Phos (alkaline Phosphatase) 34 - 104 U/L 41      Albumin 3.5 - 4.8 g/dL 4.1      Bilirubin, Total 0.3 - 1.2 mg/dL 0.9      Protein, Total 6.1 - 7.9 g/dL 6.9      A/G Ratio 1.0 - 5.0 gm/dL 1.5      Resulting Agency  [facility]  Specimen Collected: 10/31/19 8:43 AM Last Resulted: 10/31/19 11:52 AM  Received From: [place]  Result Received: 11/05/19 6:34 PM    I have reviewed the labs.  Pertinent Imaging Results for LEMAR, BAKOS (MRN [id number]) as of 07/03/2020 09:30  Ref. Range 07/03/2020 09:10  Scan Result Unknown 25    Assessment & Plan:    1. BPH with LUTS IPSS score is 17/3, it is improved Continue conservative management, avoiding bladder irritants and timed voiding's -avoid diet Pepsi Most bothersome symptoms is/are urge incontinence Continue doxazosin 8 mg daily, finasteride 5 mg daily, oxybutynin XL 10 mg daily and Gemtesa 75 mg BLADDER SCAN AMB NON-IMAGING RTC in 3 months for I PSS and PVR  2. Urge incontinence Improved on Gemtesa  Return in about 3 months (around 10/03/2020) for IPSS and PVR.  Zara Council, PA-C  [facility] [address] [place], [place] [address] [phone]

## 2020-07-03 ENCOUNTER — Encounter: Payer: Self-pay | Admitting: Urology

## 2020-07-03 ENCOUNTER — Other Ambulatory Visit: Payer: Self-pay

## 2020-07-03 ENCOUNTER — Ambulatory Visit (INDEPENDENT_AMBULATORY_CARE_PROVIDER_SITE_OTHER): Payer: Medicare Other | Admitting: Urology

## 2020-07-03 VITALS — BP 131/82 | HR 69 | Ht 60.0 in | Wt 163.5 lb

## 2020-07-03 DIAGNOSIS — N3941 Urge incontinence: Secondary | ICD-10-CM

## 2020-07-03 LAB — BLADDER SCAN AMB NON-IMAGING: Scan Result: 25

## 2020-07-03 MED ORDER — GEMTESA 75 MG PO TABS: 75.0000 mg | ORAL_TABLET | Freq: Every day | ORAL | 3 refills | Status: DC

## 2020-09-12 ENCOUNTER — Emergency Department: Payer: Medicare Other

## 2020-09-12 ENCOUNTER — Emergency Department
Admission: EM | Admit: 2020-09-12 | Discharge: 2020-09-12 | Disposition: A | Discharge status: Home / Self Care | Payer: Medicare Other | Attending: Emergency Medicine | Admitting: Emergency Medicine

## 2020-09-12 ENCOUNTER — Other Ambulatory Visit: Payer: Self-pay

## 2020-09-12 ENCOUNTER — Encounter: Payer: Self-pay | Admitting: Emergency Medicine

## 2020-09-12 DIAGNOSIS — Z79899 Other long term (current) drug therapy: Secondary | ICD-10-CM | POA: Diagnosis not present

## 2020-09-12 DIAGNOSIS — E119 Type 2 diabetes mellitus without complications: Secondary | ICD-10-CM | POA: Diagnosis not present

## 2020-09-12 DIAGNOSIS — E039 Hypothyroidism, unspecified: Secondary | ICD-10-CM | POA: Insufficient documentation

## 2020-09-12 DIAGNOSIS — Z7984 Long term (current) use of oral hypoglycemic drugs: Secondary | ICD-10-CM | POA: Insufficient documentation

## 2020-09-12 DIAGNOSIS — I10 Essential (primary) hypertension: Secondary | ICD-10-CM | POA: Insufficient documentation

## 2020-09-12 DIAGNOSIS — L03113 Cellulitis of right upper limb: Secondary | ICD-10-CM | POA: Insufficient documentation

## 2020-09-12 DIAGNOSIS — J449 Chronic obstructive pulmonary disease, unspecified: Secondary | ICD-10-CM | POA: Diagnosis not present

## 2020-09-12 DIAGNOSIS — L0889 Other specified local infections of the skin and subcutaneous tissue: Secondary | ICD-10-CM | POA: Diagnosis present

## 2020-09-12 DIAGNOSIS — J45909 Unspecified asthma, uncomplicated: Secondary | ICD-10-CM | POA: Diagnosis not present

## 2020-09-12 LAB — COMPREHENSIVE METABOLIC PANEL
ALT: 47 U/L — ABNORMAL HIGH (ref 0–44)
AST: 32 U/L (ref 15–41)
Albumin: 3.9 g/dL (ref 3.5–5.0)
Alkaline Phosphatase: 39 U/L (ref 38–126)
Anion gap: 15 (ref 5–15)
BUN: 12 mg/dL (ref 8–23)
CO2: 22 mmol/L (ref 22–32)
Calcium: 9.1 mg/dL (ref 8.9–10.3)
Chloride: 100 mmol/L (ref 98–111)
Creatinine, Ser: 0.65 mg/dL (ref 0.61–1.24)
GFR, Estimated: >60 mL/min (ref >60)
Glucose, Bld: 134 mg/dL — ABNORMAL HIGH (ref 70–99)
Potassium: 4.1 mmol/L (ref 3.5–5.1)
Sodium: 137 mmol/L (ref 135–145)
Total Bilirubin: 1.6 mg/dL — ABNORMAL HIGH (ref 0.3–1.2)
Total Protein: 7.9 g/dL (ref 6.5–8.1)

## 2020-09-12 LAB — CBC WITH DIFFERENTIAL/PLATELET
Abs Immature Granulocytes: 0.05 10*3/uL (ref 0.00–0.07)
Basophils Absolute: 0 10*3/uL (ref 0.0–0.1)
Basophils Relative: 1 %
Eosinophils Absolute: 0.1 10*3/uL (ref 0.0–0.5)
Eosinophils Relative: 1 %
HCT: 48.4 % (ref 39.0–52.0)
Hemoglobin: 16.4 g/dL (ref 13.0–17.0)
Immature Granulocytes: 1 %
Lymphocytes Relative: 16 %
Lymphs Abs: 1.1 10*3/uL (ref 0.7–4.0)
MCH: 31.5 pg (ref 26.0–34.0)
MCHC: 33.9 g/dL (ref 30.0–36.0)
MCV: 92.9 fL (ref 80.0–100.0)
Monocytes Absolute: 0.7 10*3/uL (ref 0.1–1.0)
Monocytes Relative: 9 %
Neutro Abs: 5.1 10*3/uL (ref 1.7–7.7)
Neutrophils Relative %: 72 %
Platelets: 176 10*3/uL (ref 150–400)
RBC: 5.21 MIL/uL (ref 4.22–5.81)
RDW: 12.6 % (ref 11.5–15.5)
WBC: 7.1 10*3/uL (ref 4.0–10.5)
nRBC: 0 % (ref 0.0–0.2)

## 2020-09-12 MED ORDER — CLINDAMYCIN HCL 150 MG PO CAPS
300.0000 mg | ORAL_CAPSULE | Freq: Once | ORAL | Status: AC
  Administered 2020-09-12: 300 mg via ORAL
  Filled 2020-09-12: qty 2

## 2020-09-12 MED ORDER — CLINDAMYCIN HCL 300 MG PO CAPS: 300.0000 mg | ORAL_CAPSULE | Freq: Three times a day (TID) | ORAL | 0 refills | Status: AC

## 2020-09-12 NOTE — ED Triage Notes (Addendum)
Pt arrived to ED with c/o infection to amputated R upper extremity. This RN visualized site to be red, swollen, warm to the touch and has an open wound with yellow discharge at this time.  Pt states the drainage began yesterday and states it has happened before but not this bad.
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
## 2020-09-12 NOTE — Discharge Instructions (Addendum)
Please return for increasing redness or pain or swelling.  Also return for fever or feeling sicker.  You can also return for more drainage.  I would recheck in 2 days no matter what.  In the meantime take the clindamycin 1 pill 3 times a day.  We have given you a dose for tonight that should be good for tonight you can get the prescription filled in the morning.

## 2020-09-12 NOTE — ED Notes (Signed)
Lab called to draw due to multiple unsuccessful blood draw attempts.

## 2020-09-13 LAB — SEDIMENTATION RATE: Sed Rate: 21 mm/hr — ABNORMAL HIGH (ref 0–16)

## 2020-09-20 ENCOUNTER — Other Ambulatory Visit: Payer: Self-pay

## 2020-09-20 DIAGNOSIS — E039 Hypothyroidism, unspecified: Secondary | ICD-10-CM | POA: Insufficient documentation

## 2020-09-20 DIAGNOSIS — T18128A Food in esophagus causing other injury, initial encounter: Secondary | ICD-10-CM | POA: Insufficient documentation

## 2020-09-20 DIAGNOSIS — J45909 Unspecified asthma, uncomplicated: Secondary | ICD-10-CM | POA: Insufficient documentation

## 2020-09-20 DIAGNOSIS — J449 Chronic obstructive pulmonary disease, unspecified: Secondary | ICD-10-CM | POA: Diagnosis not present

## 2020-09-20 DIAGNOSIS — Z7984 Long term (current) use of oral hypoglycemic drugs: Secondary | ICD-10-CM | POA: Insufficient documentation

## 2020-09-20 DIAGNOSIS — I10 Essential (primary) hypertension: Secondary | ICD-10-CM | POA: Insufficient documentation

## 2020-09-20 DIAGNOSIS — X58XXXA Exposure to other specified factors, initial encounter: Secondary | ICD-10-CM | POA: Diagnosis not present

## 2020-09-20 DIAGNOSIS — Z79899 Other long term (current) drug therapy: Secondary | ICD-10-CM | POA: Diagnosis not present

## 2020-09-20 LAB — CBG MONITORING, ED: Glucose-Capillary: 99 mg/dL (ref 70–99)

## 2020-09-20 NOTE — ED Triage Notes (Signed)
Pt states he ate some food including hot peppers approx 3 hours ago. Pt states he is having difficulty swallowing and is having pain in his throat. Pt states began immediately following swallowing hot peppers. Pt states he is not able to swallow liquids, but no drooling noted and patent airway present.

## 2020-09-21 ENCOUNTER — Emergency Department
Admission: EM | Admit: 2020-09-21 | Discharge: 2020-09-21 | Disposition: A | Discharge status: Home / Self Care | Payer: Medicare Other | Attending: Emergency Medicine | Admitting: Emergency Medicine

## 2020-09-21 DIAGNOSIS — T18128A Food in esophagus causing other injury, initial encounter: Secondary | ICD-10-CM

## 2020-09-21 MED ORDER — ONDANSETRON HCL 4 MG/2ML IJ SOLN
4.0000 mg | Freq: Once | INTRAMUSCULAR | Status: AC
  Administered 2020-09-21: 4 mg via INTRAVENOUS
  Filled 2020-09-21: qty 2

## 2020-09-21 MED ORDER — LIDOCAINE VISCOUS HCL 2 % MT SOLN
15.0000 mL | Freq: Once | OROMUCOSAL | Status: AC
  Administered 2020-09-21: 15 mL via ORAL
  Filled 2020-09-21: qty 15

## 2020-09-21 MED ORDER — GLUCAGON HCL (RDNA) 1 MG IJ SOLR
1.0000 mg | Freq: Once | INTRAMUSCULAR | Status: AC
  Administered 2020-09-21: 1 mg via INTRAVENOUS
  Filled 2020-09-21 (×2): qty 1

## 2020-09-21 MED ORDER — ALUM & MAG HYDROXIDE-SIMETH 200-200-20 MG/5ML PO SUSP
30.0000 mL | Freq: Once | ORAL | Status: AC
  Administered 2020-09-21: 30 mL via ORAL
  Filled 2020-09-21: qty 30

## 2020-09-21 NOTE — ED Provider Notes (Addendum)
[facility] Emergency Department Provider Note  ____________________________________________  Time seen: Approximately 3:34 AM  I have reviewed the triage vital signs and the nursing notes.   HISTORY  Chief Complaint food bolus   HPI Mathew Aguilar is a 70 y.o. male with history as listed below who presents for evaluation of impacted food bolus. Patient reports that earlier this evening he was having some peppers when a pepper got stuck in his esophagus. He feels that it is located at the base of the neck. He has been able to handle his saliva with no vomiting. He does feel nauseous. He try to drink some water with no significant improvement. He has not vomited. This has never happened to him before. Patient has never had an endoscopy before. No chest pain or shortness of breath. No aspiration event.   Past Medical History:  Diagnosis Date  . Anxiety   . Asthma   . Borderline personality disorder ([place])   . Depression   . Dysrhythmia   . Erectile dysfunction   . Fatty liver   . GERD (gastroesophageal reflux disease)   . Hypertension   . Hypothyroidism   . Pulmonary nodule, right   . Restless leg   . Sleep apnea   . Suicide attempt [facility])     Patient Active Problem List   Diagnosis Date Noted  . 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 Anxiety 05/30/2020  . Erectile dysfunction 05/30/2020  . History of avoidant personality disorder 05/30/2020  . History of dysthymic disorder 05/30/2020  . History of suicide attempt 05/30/2020  . Hypothyroidism, acquired 05/30/2020  . Diet-controlled type 2 diabetes mellitus ([place]) 11/30/2016  . Chronic insomnia 08/25/2016  . COPD (chronic obstructive pulmonary disease) ([place]) 04/03/2016  . Alcohol use disorder, mild, abuse 02/27/2016  . Severe recurrent major depression with psychotic features ([place]) 02/27/2016  . Suicidal ideation 02/03/2016  . Body mass index (BMI) of 32.0-32.9 in adult 01/23/2016  .  Fatty liver 10/24/2015  . Benign prostatic hyperplasia with lower urinary tract symptoms 04/08/2015  . GERD (gastroesophageal reflux disease) 02/19/2015  . Essential hypertension 02/18/2015  . Restless legs syndrome 02/18/2015    Past Surgical History:  Procedure Laterality Date  . AMPUTATION ARM    . COLONOSCOPY    . COLONOSCOPY WITH PROPOFOL N/A 08/22/2018   Procedure: COLONOSCOPY WITH PROPOFOL;  Surgeon: Lollie Sails, MD;  Location: [facility] ENDOSCOPY;  Service: Endoscopy;  Laterality: N/A;    Prior to Admission medications   Medication Sig Start Date End Date Taking? Authorizing Provider  ABILIFY MAINTENA 400 MG SRER Inject 400 mg into the skin every 30 (thirty) days. 11/09/16   [provider]  albuterol (PROVENTIL HFA;VENTOLIN HFA) 108 (90 Base) MCG/ACT inhaler Inhale 2 puffs into the lungs every 6 (six) hours as needed for wheezing or shortness of breath. 12/02/17   Laban Emperor, PA-C  atenolol (TENORMIN) 25 MG tablet Take 25 mg by mouth daily.    [provider]  clindamycin (CLEOCIN) 300 MG capsule Take 1 capsule (300 mg total) by mouth 3 (three) times daily for 10 days. 09/12/20 09/22/20  Nena Polio, MD  doxazosin (CARDURA) 8 MG tablet Take 1 tablet (8 mg total) by mouth daily. 10/11/19   Zara Council A, PA-C  DULoxetine (CYMBALTA) 60 MG capsule Take 2 capsules (120 mg total) by mouth daily. Patient taking differently: Take 60 mg by mouth 2 (two) times daily.  04/06/16   [name], MD  finasteride (  PROSCAR) 5 MG tablet Take 1 tablet (5 mg total) by mouth daily. 10/11/19   Zara Council A, PA-C  gabapentin (NEURONTIN) 100 MG capsule Take 100 mg by mouth 3 (three) times daily. 10/09/19   [provider]  hydrochlorothiazide (MICROZIDE) 12.5 MG capsule Take 1 capsule (12.5 mg total) by mouth daily. 04/06/16   [name], MD  HYDROcodone-acetaminophen (NORCO/VICODIN) 5-325 MG tablet Take 1 tablet by mouth every 6 (six)  hours as needed for moderate pain or severe pain. 07/09/19   Delman Kitten, MD  lamoTRIgine (LAMICTAL) 100 MG tablet Take 50-100 mg by mouth daily. Take 50 mg by mouth every morning and 100 mg by mouth at bedtime for two days then take 100 mg by mouth daily thereafter.    [provider]  levocetirizine (XYZAL) 5 MG tablet Take 5 mg by mouth every evening.    [provider]  levothyroxine (SYNTHROID, LEVOTHROID) 75 MCG tablet Take 1 tablet (75 mcg total) by mouth daily before breakfast. 04/06/16   [name], MD  loratadine (CLARITIN) 10 MG tablet Take 1 tablet (10 mg total) by mouth daily. 12/01/16   Eula Listen, MD  Melatonin 3 MG TABS Take 3 mg by mouth at bedtime as needed.    [provider]  metFORMIN (GLUCOPHAGE) 500 MG tablet Take 1 tablet (500 mg total) by mouth daily. 04/06/16   [name], MD  mirtazapine (REMERON) 15 MG tablet Take 15 mg by mouth at bedtime. 12/07/16   [provider]  mirtazapine (REMERON) 30 MG tablet Take 30 mg by mouth at bedtime. 10/19/19   [provider]  nystatin ointment (MYCOSTATIN) Apply 1 application topically 2 (two) times daily. 07/13/19   Hollice Espy, MD  nystatin-triamcinolone ointment [facility]) Apply 1 application topically 2 (two) times daily. 07/05/19   Hollice Espy, MD  omega-3 acid ethyl esters (LOVAZA) 1 g capsule Take 1 g by mouth daily.    [provider]  omeprazole (PRILOSEC) 20 MG capsule Take 20 mg by mouth daily.    [provider]  oxybutynin (DITROPAN-XL) 10 MG 24 hr tablet Take 1 tablet (10 mg total) by mouth daily. 10/11/19   Zara Council A, PA-C  pramipexole (MIRAPEX) 1 MG tablet Take by mouth. 05/10/20   [provider]  primidone (MYSOLINE) 50 MG tablet Take by mouth at bedtime.    [provider]  rOPINIRole (REQUIP) 3 MG tablet Take 1 tablet (3 mg total) by mouth at bedtime. Patient taking differently: Take 6  mg by mouth at bedtime.  04/06/16   [name], MD  rosuvastatin (CRESTOR) 10 MG tablet Take 10 mg by mouth at bedtime. 05/10/20   [provider]  tadalafil (CIALIS) 20 MG tablet Take by mouth. 03/06/20   [provider]  temazepam (RESTORIL) 15 MG capsule Take 15 mg by mouth daily. 11/30/16   [provider]  triamcinolone (KENALOG) 0.025 % cream Apply 1 application topically 2 (two) times daily. 07/13/19   Hollice Espy, MD  Vibegron (GEMTESA) 75 MG TABS Take 75 mg by mouth daily. 05/31/20   McGowan, Larene [place] A, PA-C  Vibegron (GEMTESA) 75 MG TABS Take 75 mg by mouth daily. 07/03/20   Zara Council A, PA-C    Allergies Patient has no known allergies.  Family History  Family history unknown: Yes    Social History Social History   Tobacco Use  . Smoking status: Never Smoker  . Smokeless tobacco: Current User    Types: Chew  Vaping  Use  . Vaping Use: Never used  Substance Use Topics  . Alcohol use: Yes    Comment: history of ETOH abuse - sober several months  . Drug use: No    Review of Systems  Constitutional: Negative for fever. Eyes: Negative for visual changes. ENT: Negative for sore throat. Neck: No neck pain. + food bolus  Cardiovascular: Negative for chest pain. Respiratory: Negative for shortness of breath. Gastrointestinal: Negative for abdominal pain, vomiting or diarrhea. Genitourinary: Negative for dysuria. Musculoskeletal: Negative for back pain. Skin: Negative for rash. Neurological: Negative for headaches, weakness or numbness. Psych: No SI or HI  ____________________________________________   PHYSICAL EXAM:  VITAL SIGNS: ED Triage Vitals [09/20/20 2325]  Enc Vitals Group     BP (!) 142/87     Pulse Rate 75     Resp 16     Temp 97.8 F (36.6 C)     Temp Source Oral     SpO2 98 %     Weight 160 lb (72.6 kg)     Height 5' (1.524 m)     Head Circumference      Peak Flow      Pain Score 6     Pain  Loc      Pain Edu?      Excl. in [place]?     Constitutional: Alert and oriented. Well appearing and in no apparent distress. HEENT:      Head: Normocephalic and atraumatic.         Eyes: Conjunctivae are normal. Sclera is non-icteric.       Mouth/Throat: Mucous membranes are moist.       Neck: Supple with no signs of meningismus. Cardiovascular: Regular rate and rhythm. No murmurs, gallops, or rubs. Respiratory: Normal respiratory effort. Lungs are clear to auscultation bilaterally.  Gastrointestinal: Soft, non tender. Musculoskeletal: No edema, cyanosis, or erythema of extremities. Neurologic: Normal speech and language. Face is symmetric. Moving all extremities. No gross focal neurologic deficits are appreciated. Skin: Skin is warm, dry and intact. No rash noted. Psychiatric: Mood and affect are normal. Speech and behavior are normal.  ____________________________________________   LABS (all labs ordered are listed, but only abnormal results are displayed)  Labs Reviewed  CBG MONITORING, ED  CBG MONITORING, ED   ____________________________________________  EKG  ED ECG REPORT I, Rudene Re, the attending physician, personally viewed and interpreted this ECG.  Normal sinus rhythm, rate of 69, normal intervals, normal axis, no ST elevations or depressions. ____________________________________________  RADIOLOGY  none  ____________________________________________   PROCEDURES  Procedure(s) performed: None Procedures Critical Care performed:  None ____________________________________________   INITIAL IMPRESSION / ASSESSMENT AND PLAN / ED COURSE  70 y.o. male with history as listed below who presents for evaluation of impacted food bolus. Patient in no respiratory distress, handling his saliva with no difficulty. Not actively vomiting. Normal vital signs. Normal CBG. Normal EKG. Will try Zofran and glucagon IV followed by p.o. challenge. Old medical records  reviewed with no prior records of an endoscopy.   _________________________ 6:27 AM on 09/21/2020 -----------------------------------------  Patient passed PO challenge and after viscous lidocaine and maalox feels improved. No vomiting. Will dc home on soft and liquid diet and follow up with GI on Monday if symptoms persist. Discussed my standard return precautions if patient starts to vomit and is unable to tolerate PO at home.     _____________________________________________ Please note:  Patient was evaluated in Emergency Department today for the symptoms described in the history  of present illness. Patient was evaluated in the context of the global COVID-19 pandemic, which necessitated consideration that the patient might be at risk for infection with the SARS-CoV-2 virus that causes COVID-19. Institutional protocols and algorithms that pertain to the evaluation of patients at risk for COVID-19 are in a state of rapid change based on information released by regulatory bodies including the CDC and federal and state organizations. These policies and algorithms were followed during the patient's care in the ED.  Some ED evaluations and interventions may be delayed as a result of limited staffing during the pandemic.   [place] Controlled Substance Database was reviewed by me. ____________________________________________   FINAL CLINICAL IMPRESSION(S) / ED DIAGNOSES   Final diagnoses:  Food impaction of esophagus, initial encounter      NEW MEDICATIONS STARTED DURING THIS VISIT:  ED Discharge Orders    None       Note:  This document was prepared using Dragon voice recognition software and may include unintentional dictation errors.    Alfred Levins, [place], MD 09/21/20 Karma Ganja, [place], MD 09/21/20 [phone]

## 2020-09-21 NOTE — ED Notes (Signed)
Patient verbalizes understanding of discharge instructions. Opportunity for questioning and answers were provided. Armband removed by staff, pt discharged from ED. Ambulated out to lobby  

## 2020-09-21 NOTE — Discharge Instructions (Signed)
Try a soft and liquid diet for the next 48 hours. Call GI on Monday if you still feel like there is something stuck on your throat. If you start vomiting, please return to the ER.

## 2020-09-26 ENCOUNTER — Ambulatory Visit: Payer: Medicare Other | Attending: Urology | Admitting: Physical Therapy

## 2020-10-02 NOTE — Progress Notes (Signed)
10/03/2020 4:25 PM   Mathew Aguilar 21-Feb-1951 [id number]  Referring provider: Medicine, [facility] [address] [place],  [place] [address]  No chief complaint on file.  Urological history 1. BPH with LU TS - I PSS 22/6 - Calculated prostate volume in 2016 was 69.8 based on sizes of 5.09 x 5.47 x 4.57 cm on CT scan - managed with doxazosin 8 mg daily and finasteride 5 mg daily  2. Urgency - managed with oxybutynin XL 10 mg daily and Gemtesa 75 mg daily - Cysto 06/2019 with Dr. Erlene Quan No significant urinary obstruction visually on cystoscopy.  No underlying pathology including tumors, masses, lesions or stones  3. Right renal complex cyst - CT w/wo 2016 right mid kidney lesion remains indistinct, and is too small to technically characterize. It may well be complex, but I cannot tell if it is enhancing and accordingly I can't confidently assign a Bosniak classification. The lack of significant progression compared to 04/09/13 is somewhat reassuring against malignancy - RUS 06/2019 Normal renal ultrasound   HPI: Mathew Aguilar is a 70 y.o. male who presents today for a three month follow up.       His biggest complaint is nocturia x 3-4.  He states that he has sleep apnea and sleeps with a CPAP machine.  He typically goes to bed at midnight and drinks fluids until 10 pm.    He is not taking the Gemtesa at this time.  He took up the samples, but he did not pick up the prescription as he states the pharmacy did not call him.    Patient denies any modifying or aggravating factors.  Patient denies any gross hematuria, dysuria or suprapubic/flank pain.  Patient denies any fevers, chills, nausea or vomiting.   PVR is 31 mL.    UA is negative.       IPSS    Row Name 10/03/20 0900         International Prostate Symptom Score   How often have you had the sensation of not emptying your bladder? More than half the time     How often have you had to urinate  less than every two hours? More than half the time     How often have you found you stopped and started again several times when you urinated? Not at All     How often have you found it difficult to postpone urination? Almost always     How often have you had a weak urinary stream? Almost always     How often have you had to strain to start urination? Not at All     How many times did you typically get up at night to urinate? 4 Times     Total IPSS Score 22           Quality of Life due to urinary symptoms   If you were to spend the rest of your life with your urinary condition just the way it is now how would you feel about that? 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    PMH: Past Medical History:  Diagnosis Date  . Anxiety   . Asthma   . Borderline personality disorder ([place])   . Depression   . Dysrhythmia   . Erectile dysfunction   .  Fatty liver   . GERD (gastroesophageal reflux disease)   . Hypertension   . Hypothyroidism   . Pulmonary nodule, right   . Restless leg   . Sleep apnea   . Suicide attempt [facility])     Surgical History: Past Surgical History:  Procedure Laterality Date  . AMPUTATION ARM    . COLONOSCOPY    . COLONOSCOPY WITH PROPOFOL N/A 08/22/2018   Procedure: COLONOSCOPY WITH PROPOFOL;  Surgeon: Lollie Sails, MD;  Location: [facility] ENDOSCOPY;  Service: Endoscopy;  Laterality: N/A;    Home Medications:  Allergies as of 10/03/2020   No Known Allergies     Medication List       Accurate as of October 03, 2020 11:59 PM. If you have any questions, ask your nurse or doctor.        Abilify Maintena 400 MG Srer injection Generic drug: ARIPiprazole ER Inject 400 mg into the skin every 30 (thirty) days.   albuterol 108 (90 Base) MCG/ACT inhaler Commonly known as: VENTOLIN HFA Inhale 2 puffs into the lungs every 6  (six) hours as needed for wheezing or shortness of breath.   atenolol 25 MG tablet Commonly known as: TENORMIN Take 25 mg by mouth daily.   doxazosin 8 MG tablet Commonly known as: CARDURA Take 1 tablet (8 mg total) by mouth daily.   doxepin 25 MG capsule Commonly known as: SINEQUAN Take 25 mg by mouth at bedtime.   DULoxetine 60 MG capsule Commonly known as: CYMBALTA Take 2 capsules (120 mg total) by mouth daily. What changed:   how much to take  when to take this   finasteride 5 MG tablet Commonly known as: PROSCAR Take 1 tablet (5 mg total) by mouth daily.   gabapentin 100 MG capsule Commonly known as: NEURONTIN Take 100 mg by mouth 3 (three) times daily.   Gemtesa 75 MG Tabs Generic drug: Vibegron Take 75 mg by mouth daily. What changed: Another medication with the same name was removed. Continue taking this medication, and follow the directions you see here. Changed by: Zara Council, PA-C   hydrochlorothiazide 12.5 MG capsule Commonly known as: MICROZIDE Take 1 capsule (12.5 mg total) by mouth daily.   HYDROcodone-acetaminophen 5-325 MG tablet Commonly known as: NORCO/VICODIN Take 1 tablet by mouth every 6 (six) hours as needed for moderate pain or severe pain.   lamoTRIgine 100 MG tablet Commonly known as: LAMICTAL Take 50-100 mg by mouth daily. Take 50 mg by mouth every morning and 100 mg by mouth at bedtime for two days then take 100 mg by mouth daily thereafter.   levocetirizine 5 MG tablet Commonly known as: XYZAL Take 5 mg by mouth every evening.   levothyroxine 75 MCG tablet Commonly known as: SYNTHROID Take 1 tablet (75 mcg total) by mouth daily before breakfast.   loratadine 10 MG tablet Commonly known as: CLARITIN Take 1 tablet (10 mg total) by mouth daily.   melatonin 3 MG Tabs tablet Take 3 mg by mouth at bedtime as needed.   metFORMIN 500 MG tablet Commonly known as: GLUCOPHAGE Take 1 tablet (500 mg total) by mouth daily.    mirtazapine 15 MG tablet Commonly known as: REMERON Take 15 mg by mouth at bedtime.   mirtazapine 30 MG tablet Commonly known as: REMERON Take 30 mg by mouth at bedtime.   nystatin ointment Commonly known as: MYCOSTATIN Apply 1 application topically 2 (two) times daily.   nystatin-triamcinolone ointment Commonly known as: MYCOLOG Apply 1 application  topically 2 (two) times daily.   omega-3 acid ethyl esters 1 g capsule Commonly known as: LOVAZA Take 1 g by mouth daily.   omeprazole 20 MG capsule Commonly known as: PRILOSEC Take 20 mg by mouth daily.   oxybutynin 10 MG 24 hr tablet Commonly known as: DITROPAN-XL Take 1 tablet (10 mg total) by mouth daily.   pramipexole 1 MG tablet Commonly known as: MIRAPEX Take by mouth.   primidone 50 MG tablet Commonly known as: MYSOLINE Take by mouth at bedtime.   rOPINIRole 1 MG tablet Commonly known as: REQUIP Take 1 mg by mouth 3 (three) times daily. What changed: Another medication with the same name was removed. Continue taking this medication, and follow the directions you see here. Changed by: Zara Council, PA-C   rosuvastatin 10 MG tablet Commonly known as: CRESTOR Take 10 mg by mouth at bedtime.   sulfamethoxazole-trimethoprim 400-80 MG tablet Commonly known as: BACTRIM Take 1 tablet by mouth 2 (two) times daily.   tadalafil 20 MG tablet Commonly known as: CIALIS Take by mouth.   temazepam 15 MG capsule Commonly known as: RESTORIL Take 15 mg by mouth daily.   triamcinolone 0.025 % cream Commonly known as: KENALOG Apply 1 application topically 2 (two) times daily.       Allergies: No Known Allergies  Family History: Family History  Family history unknown: Yes    Social History:  reports that he has never smoked. His smokeless tobacco use includes chew. He reports current alcohol use. He reports that he does not use drugs.  ROS: For pertinent review of systems please refer to history of present  illness  Physical Exam: BP 127/69   Pulse 71   Ht 5' (1.524 m)   Wt 160 lb (72.6 kg)   BMI 31.25 kg/m  Constitutional:  Well nourished. Alert and oriented, No acute distress. HEENT: [place] AT, mask in place.  Trachea midline Cardiovascular: No clubbing, cyanosis, or edema. Respiratory: Normal respiratory effort, no increased work of breathing. Neurologic: Grossly intact, no focal deficits, moving all 3 extremities.  Right arm amputation.  Psychiatric: Normal mood and affect.  Laboratory Data: PSA Trend  1.11 on June 28, 2014  1.94 on September 27, 2015  1.61 on July 02, 2018  0.56 on October 27, 2018  Component     Latest Ref Rng & Units 10/30/2019 11/14/2019 05/17/2020  Prostate Specific Ag, Serum     0.0 - 4.0 ng/mL 2.3 1.2 0.6    Lab Results  Component Value Date   WBC 7.1 09/12/2020   HGB 16.4 09/12/2020   HCT 48.4 09/12/2020   MCV 92.9 09/12/2020   PLT 176 09/12/2020    Lab Results  Component Value Date   CREATININE 0.65 09/12/2020   Urinalysis Component     Latest Ref Rng & Units 10/03/2020  Specific Gravity, UA     1.005 - 1.030 1.015  pH, UA     5.0 - 7.5 5.5  Color, UA     Yellow Yellow  Appearance Ur     Clear Clear  Leukocytes,UA     Negative Negative  Protein,UA     Negative/Trace Negative  Glucose, UA     Negative 2+ (A)  Ketones, UA     Negative Negative  RBC, UA     Negative Negative  Bilirubin, UA     Negative Negative  Urobilinogen, Ur     0.2 - 1.0 mg/dL 0.2  Nitrite, UA     Negative  Negative  Microscopic Examination      See below:   Component     Latest Ref Rng & Units 10/03/2020  WBC, UA     0 - 5 /hpf 0-5  RBC     0 - 2 /hpf None seen  Epithelial Cells (non renal)     0 - 10 /hpf None seen  Bacteria, UA     None seen/Few None seen     I have reviewed the labs.  Pertinent Imaging Results for WILMER, SULLA (MRN [id number]) as of 10/03/2020 10:02  Ref. Range 10/03/2020 09:57  Scan Result Unknown 31 ml      Assessment & Plan:    1. BPH with LUTS IPSS score is 22/6, it is worsening Continue conservative management, avoiding bladder irritants and timed voiding's Most bothersome symptoms is/are nocturia x 3-4 Continue doxazosin 8 mg daily and finasteride 5 mg daily RTC in September for IPSS, PSA, PVR and exam   2. Nocturia Continue oxybutynin XL 10 mg daily and Gemtesa 75 mg daily - prescription sent for Gemtesa to [facility] Encouraged to continue CPAP use Advised patient to limit his fluid intake at 8 PM versus 10 PM to see if that will cut back on the nocturia   Return for September .  Zara Council, PA-C  [facility] [address], [place],  [address] [phone]

## 2020-10-03 ENCOUNTER — Ambulatory Visit (INDEPENDENT_AMBULATORY_CARE_PROVIDER_SITE_OTHER): Payer: Medicare Other | Admitting: Urology

## 2020-10-03 ENCOUNTER — Other Ambulatory Visit: Payer: Self-pay

## 2020-10-03 ENCOUNTER — Encounter: Payer: Medicare Other | Admitting: Physical Therapy

## 2020-10-03 VITALS — BP 127/69 | HR 71 | Ht 60.0 in | Wt 160.0 lb

## 2020-10-03 DIAGNOSIS — N138 Other obstructive and reflux uropathy: Secondary | ICD-10-CM | POA: Diagnosis not present

## 2020-10-03 DIAGNOSIS — N3941 Urge incontinence: Secondary | ICD-10-CM | POA: Diagnosis not present

## 2020-10-03 DIAGNOSIS — N401 Enlarged prostate with lower urinary tract symptoms: Secondary | ICD-10-CM | POA: Diagnosis not present

## 2020-10-03 LAB — MICROSCOPIC EXAMINATION
Bacteria, UA: NONE SEEN
Epithelial Cells (non renal): NONE SEEN /hpf (ref 0–10)
RBC, Urine: NONE SEEN /hpf (ref 0–2)

## 2020-10-03 LAB — URINALYSIS, COMPLETE
Bilirubin, UA: NEGATIVE
Ketones, UA: NEGATIVE
Leukocytes,UA: NEGATIVE
Nitrite, UA: NEGATIVE
Protein,UA: NEGATIVE
RBC, UA: NEGATIVE
Specific Gravity, UA: 1.015 (ref 1.005–1.030)
Urobilinogen, Ur: 0.2 mg/dL (ref 0.2–1.0)
pH, UA: 5.5 (ref 5.0–7.5)

## 2020-10-03 LAB — BLADDER SCAN AMB NON-IMAGING: Scan Result: 31

## 2020-10-03 MED ORDER — GEMTESA 75 MG PO TABS: 75.0000 mg | ORAL_TABLET | Freq: Every day | ORAL | 3 refills | Status: DC

## 2020-10-07 ENCOUNTER — Other Ambulatory Visit: Payer: Self-pay | Admitting: Infectious Diseases

## 2020-10-07 ENCOUNTER — Ambulatory Visit
Admission: RE | Admit: 2020-10-07 | Discharge: 2020-10-07 | Disposition: A | Discharge status: Home / Self Care | Payer: Medicare Other | Source: Ambulatory Visit | Attending: Infectious Diseases | Admitting: Infectious Diseases

## 2020-10-07 ENCOUNTER — Other Ambulatory Visit: Payer: Self-pay

## 2020-10-07 DIAGNOSIS — M86121 Other acute osteomyelitis, right humerus: Secondary | ICD-10-CM | POA: Insufficient documentation

## 2020-10-10 ENCOUNTER — Encounter: Payer: Medicare Other | Admitting: Physical Therapy

## 2020-10-17 ENCOUNTER — Encounter: Payer: Medicare Other | Admitting: Physical Therapy

## 2020-10-23 ENCOUNTER — Encounter: Payer: Self-pay | Admitting: Urology

## 2020-10-24 ENCOUNTER — Encounter: Payer: Medicare Other | Admitting: Physical Therapy

## 2020-10-31 ENCOUNTER — Encounter: Payer: Medicare Other | Admitting: Physical Therapy

## 2020-11-07 ENCOUNTER — Encounter: Payer: Medicare Other | Admitting: Physical Therapy

## 2020-11-14 ENCOUNTER — Encounter: Payer: Medicare Other | Admitting: Physical Therapy

## 2020-11-21 ENCOUNTER — Encounter: Payer: Medicare Other | Admitting: Physical Therapy

## 2020-12-08 ENCOUNTER — Other Ambulatory Visit: Payer: Self-pay | Admitting: Urology

## 2020-12-08 DIAGNOSIS — N401 Enlarged prostate with lower urinary tract symptoms: Secondary | ICD-10-CM

## 2021-05-29 ENCOUNTER — Other Ambulatory Visit: Payer: Self-pay

## 2021-05-29 ENCOUNTER — Other Ambulatory Visit: Payer: Medicare Other

## 2021-05-29 DIAGNOSIS — N401 Enlarged prostate with lower urinary tract symptoms: Secondary | ICD-10-CM

## 2021-05-29 DIAGNOSIS — N138 Other obstructive and reflux uropathy: Secondary | ICD-10-CM

## 2021-05-30 LAB — PSA: Prostate Specific Ag, Serum: 0.6 ng/mL (ref 0.0–4.0)

## 2021-06-04 NOTE — Progress Notes (Incomplete)
06/04/21 11:51 AM   Mathew Aguilar Oct 05, 1950 [id number]  Referring provider:  Baxter Hire, MD [place],  [place] [id number] No chief complaint on file.   Urological history  BPH with LUTS  - PSA 0.6 - IPSS -  Calculated prostate volume in 2016 was 69.8 based on sizes of 5.09 x 5.47 x 4.57 cm on CT scan - managed with doxazoin 8 mg and finasteride 5 mg.   2. Urgency  - managed with oxybutynin XL  10 mg and Gemtesa 7 mg. -  Cysto 06/2019 with Dr. Erlene Quan No significant urinary obstruction visually on cystoscopy.  No underlying pathology including tumors, masses, lesions or stones  3. Right renal complex cyst  - CT w/w 2016 showed right mid kidney lesion remains indistinct. It was too small to characterize. It may well be complex, but I cannot tell if it is enhancing and accordingly I can't confidently assign a Bosniak classification. The lack of significant progression compared to 04/09/13 is somewhat reassuring against malignancy - RUS 06/2019 Normal renal ultrasound     HPI: Mathew Aguilar is a 70 y.o.male who presents today for follow-up with PSA, PVR, DRE, and IPSS.    IPSS***   PMH: Past Medical History:  Diagnosis Date   Anxiety    Asthma    Borderline personality disorder ([place])    Depression    Dysrhythmia    Erectile dysfunction    Fatty liver    GERD (gastroesophageal reflux disease)    Hypertension    Hypothyroidism    Pulmonary nodule, right    Restless leg    Sleep apnea    Suicide attempt [facility])     Surgical History: Past Surgical History:  Procedure Laterality Date   AMPUTATION ARM     COLONOSCOPY     COLONOSCOPY WITH PROPOFOL N/A 08/22/2018   Procedure: COLONOSCOPY WITH PROPOFOL;  Surgeon: Lollie Sails, MD;  Location: [facility] ENDOSCOPY;  Service: Endoscopy;  Laterality: N/A;    Home Medications:  Allergies as of 06/05/2021   No Known Allergies      Medication List        Accurate as of June 04, 2021  11:51 AM. If you have any questions, ask your nurse or doctor.          Abilify Maintena 400 MG Srer injection Generic drug: ARIPiprazole ER Inject 400 mg into the skin every 30 (thirty) days.   albuterol 108 (90 Base) MCG/ACT inhaler Commonly known as: VENTOLIN HFA Inhale 2 puffs into the lungs every 6 (six) hours as needed for wheezing or shortness of breath.   atenolol 25 MG tablet Commonly known as: TENORMIN Take 25 mg by mouth daily.   doxazosin 8 MG tablet Commonly known as: CARDURA Take 1 tablet (8 mg total) by mouth daily.   doxepin 25 MG capsule Commonly known as: SINEQUAN Take 25 mg by mouth at bedtime.   DULoxetine 60 MG capsule Commonly known as: CYMBALTA Take 2 capsules (120 mg total) by mouth daily. What changed:  how much to take when to take this   finasteride 5 MG tablet Commonly known as: PROSCAR Take 1 tablet (5 mg total) by mouth daily.   gabapentin 100 MG capsule Commonly known as: NEURONTIN Take 100 mg by mouth 3 (three) times daily.   Gemtesa 75 MG Tabs Generic drug: Vibegron Take 75 mg by mouth daily.   hydrochlorothiazide 12.5 MG capsule Commonly known as: MICROZIDE Take 1 capsule (12.5 mg total)  by mouth daily.   HYDROcodone-acetaminophen 5-325 MG tablet Commonly known as: NORCO/VICODIN Take 1 tablet by mouth every 6 (six) hours as needed for moderate pain or severe pain.   lamoTRIgine 100 MG tablet Commonly known as: LAMICTAL Take 50-100 mg by mouth daily. Take 50 mg by mouth every morning and 100 mg by mouth at bedtime for two days then take 100 mg by mouth daily thereafter.   levocetirizine 5 MG tablet Commonly known as: XYZAL Take 5 mg by mouth every evening.   levothyroxine 75 MCG tablet Commonly known as: SYNTHROID Take 1 tablet (75 mcg total) by mouth daily before breakfast.   loratadine 10 MG tablet Commonly known as: CLARITIN Take 1 tablet (10 mg total) by mouth daily.   melatonin 3 MG Tabs tablet Take 3 mg by  mouth at bedtime as needed.   metFORMIN 500 MG tablet Commonly known as: GLUCOPHAGE Take 1 tablet (500 mg total) by mouth daily.   mirtazapine 15 MG tablet Commonly known as: REMERON Take 15 mg by mouth at bedtime.   mirtazapine 30 MG tablet Commonly known as: REMERON Take 30 mg by mouth at bedtime.   nystatin ointment Commonly known as: MYCOSTATIN Apply 1 application topically 2 (two) times daily.   nystatin-triamcinolone ointment Commonly known as: MYCOLOG Apply 1 application topically 2 (two) times daily.   omega-3 acid ethyl esters 1 g capsule Commonly known as: LOVAZA Take 1 g by mouth daily.   omeprazole 20 MG capsule Commonly known as: PRILOSEC Take 20 mg by mouth daily.   oxybutynin 10 MG 24 hr tablet Commonly known as: DITROPAN-XL TAKE 1 TABLET(10 MG) BY MOUTH DAILY   oxybutynin 10 MG 24 hr tablet Commonly known as: DITROPAN-XL TAKE 1 TABLET(10 MG) BY MOUTH DAILY   pramipexole 1 MG tablet Commonly known as: MIRAPEX Take by mouth.   primidone 50 MG tablet Commonly known as: MYSOLINE Take by mouth at bedtime.   rOPINIRole 1 MG tablet Commonly known as: REQUIP Take 1 mg by mouth 3 (three) times daily.   rosuvastatin 10 MG tablet Commonly known as: CRESTOR Take 10 mg by mouth at bedtime.   sulfamethoxazole-trimethoprim 400-80 MG tablet Commonly known as: BACTRIM Take 1 tablet by mouth 2 (two) times daily.   tadalafil 20 MG tablet Commonly known as: CIALIS Take by mouth.   temazepam 15 MG capsule Commonly known as: RESTORIL Take 15 mg by mouth daily.   triamcinolone 0.025 % cream Commonly known as: KENALOG Apply 1 application topically 2 (two) times daily.        Allergies:  No Known Allergies  Family History: Family History  Family history unknown: Yes    Social History:  reports that he has never smoked. His smokeless tobacco use includes chew. He reports current alcohol use. He reports that he does not use drugs.   Physical  Exam: There were no vitals taken for this visit.  Constitutional:  Alert and oriented, No acute distress. HEENT: [place] AT, moist mucus membranes.  Trachea midline, no masses. Cardiovascular: No clubbing, cyanosis, or edema. Respiratory: Normal respiratory effort, no increased work of breathing. GI: Abdomen is soft, nontender, nondistended, no abdominal masses GU: No CVA tenderness Rectal: Normal sphincter tone,  ***  CC prostate, smooth no nodules Lymph: No cervical or inguinal lymphadenopathy. Skin: No rashes, bruises or suspicious lesions. Neurologic: Grossly intact, no focal deficits, moving all 4 extremities. Psychiatric: Normal mood and affect.  Laboratory Data:  Lab Results  Component Value Date   CREATININE  0.65 09/12/2020     Ref Range & Units 7 d ago  Glucose 70 - 110 mg/dL 153 High    Sodium 136 - 145 mmol/L 138   Potassium 3.6 - 5.1 mmol/L 4.1   Chloride 97 - 109 mmol/L 103   Carbon Dioxide (CO2) 22.0 - 32.0 mmol/L 25.4   Calcium 8.7 - 10.3 mg/dL 9.3   Urea Nitrogen (BUN) 7 - 25 mg/dL 11   Creatinine 0.7 - 1.3 mg/dL 0.6 Low    Glomerular Filtration Rate (eGFR), MDRD Estimate >60 mL/min/1.73sq m 133   BUN/Crea Ratio 6.0 - 20.0 18.3   Anion Gap w/K 6.0 - 16.0 13.7   Resulting Agency  [facility]  Specimen Collected: 05/28/21 07:17 Last Resulted: 05/28/21 10:08  Received From: [facility]  Result Received: 05/29/21 07:46    Lab Results  Component Value Date   HGBA1C 5.2 02/28/2016    Ref Range & Units 7 d ago  Thyroid Stimulating Hormone (TSH) 0.450-5.330 uIU/ml uIU/mL 2.456   Resulting Agency  [facility]  Specimen Collected: 05/28/21 07:17 Last Resulted: 05/28/21 10:08  Received From: [facility]  Result Received: 05/29/21 07:46   Component Prostate Specific Ag, Serum  Latest Ref Rng & Units 0.0 - 4.0 ng/mL  10/30/2019 2.3  11/14/2019 1.2  05/17/2020 0.6  05/29/2021 0.6    Urinalysis   Pertinent Imaging: PVR***  Assessment & Plan:     No follow-ups on file.  [facility] [address] [place], [place] [address] [phone]  I,Kailey Littlejohn,acting as a scribe for [place], PA-C.,have documented all relevant documentation on the behalf of SHANNON MCGOWAN, PA-C,as directed by  [facility], PA-C while in the presence of [place], PA-C.

## 2021-06-05 ENCOUNTER — Ambulatory Visit: Payer: Self-pay | Admitting: Urology

## 2021-06-19 ENCOUNTER — Other Ambulatory Visit (HOSPITAL_COMMUNITY): Payer: Self-pay | Admitting: Infectious Diseases

## 2021-06-19 ENCOUNTER — Other Ambulatory Visit: Payer: Self-pay | Admitting: Infectious Diseases

## 2021-06-19 DIAGNOSIS — M86121 Other acute osteomyelitis, right humerus: Secondary | ICD-10-CM

## 2021-07-08 ENCOUNTER — Ambulatory Visit: Payer: Medicare Other | Attending: Infectious Diseases

## 2021-10-21 ENCOUNTER — Other Ambulatory Visit: Payer: Self-pay | Admitting: Urology

## 2021-10-21 DIAGNOSIS — N3941 Urge incontinence: Secondary | ICD-10-CM

## 2021-10-30 ENCOUNTER — Encounter: Payer: Self-pay | Admitting: Urology

## 2021-11-12 ENCOUNTER — Other Ambulatory Visit: Payer: Self-pay | Admitting: Orthopedic Surgery

## 2021-11-12 ENCOUNTER — Ambulatory Visit: Payer: Medicare Other | Admitting: Urology

## 2021-11-12 DIAGNOSIS — S46012A Strain of muscle(s) and tendon(s) of the rotator cuff of left shoulder, initial encounter: Secondary | ICD-10-CM

## 2021-11-25 ENCOUNTER — Ambulatory Visit
Admission: RE | Admit: 2021-11-25 | Discharge: 2021-11-25 | Disposition: A | Discharge status: Home / Self Care | Payer: Medicare Other | Source: Ambulatory Visit | Attending: Orthopedic Surgery | Admitting: Orthopedic Surgery

## 2021-11-25 DIAGNOSIS — S46012A Strain of muscle(s) and tendon(s) of the rotator cuff of left shoulder, initial encounter: Secondary | ICD-10-CM | POA: Insufficient documentation

## 2021-12-02 ENCOUNTER — Ambulatory Visit: Payer: Medicare Other | Admitting: Urology

## 2021-12-29 NOTE — Progress Notes (Signed)
01/07/22 ?10:56 AM  ? ?Mathew Aguilar ?Oct 31, 1950 ?[id number] ? ?Referring provider:  ?Baxter Hire, MD ?[place] ?[place],  [place] [address] ? ?Chief Complaint  ?Patient presents with  ? Benign Prostatic Hypertrophy  ? ? ?Urological history  ?1. BPH with LU TS ?- PSA 0.41 11/2021 ?- cysto 2020 - Normal prostate without significant coaptation, some bleeding with manipulation ?- Calculated prostate volume in 2016 was 69.8 based on sizes of 5.09 x 5.47 x 4.57 cm on CT scan ?- I PSS 10/6 ?- managed with doxazosin 8 mg daily and finasteride 5 mg daily ?  ?2. Urgency ?-Contributing factors of age, BPH, carbonated drinks, artificial sweeteners, caffeine, hypertension, diabetes, alcohol consumption, depression, diuretics, COPD and restless leg ?- cysto 06/2019 with Dr. Erlene Quan No significant urinary obstruction visually on cystoscopy.  No underlying pathology including tumors, masses, lesions or stones ?- PVR 48 mL ?- managed with oxybutynin XL 10 mg daily and Gemtesa 75 mg daily ?  ?3. Right renal complex cyst ?- CT w/wo 2016 right mid kidney lesion remains indistinct, and is too small to technically characterize. It may well be complex, but I cannot tell if it is enhancing and accordingly I can't confidently assign a Bosniak classification. The lack of significant progression compared to 04/09/13 is somewhat reassuring against malignancy ?- RUS 06/2019 Normal renal ultrasound ? ?4. ED ?-Contributing factors of age, BPH, hypertension, diabetes, alcohol consumption, depression, COPD, alcohol consumption, blood pressure medication and BPH medication ?-Managed with tadalafil 20 mg on demand dosing ? ? ?HPI: ?Mathew Aguilar is a 71 y.o.male who presents today for a follow-up with PSA, DRE, IPSS, and PVR. ? ?He reports urinary frequency and that he drinks diet coke and water mostly. He has been taking Gemtesa, doxaszosin, and finasteride he has not had improvement on this medication. He has to urinate once  every couple hours. His stream is not strong he does not strain and he sometimes feels like his bladder isn't empty after voiding. He denies any hematuria.  He also reports urinary accidents.  ? ?Patient denies any modifying or aggravating factors.  Patient denies any gross hematuria, dysuria or suprapubic/flank pain.  Patient denies any fevers, chills, nausea or vomiting.   ? ?UA benign ? ?PVR 48 mL ? ? IPSS   ? ? Mathew Aguilar 01/01/22 1500  ?  ?  ?  ? International Prostate Symptom Score  ? How often have you had the sensation of not emptying your bladder? Not at All    ? How often have you had to urinate less than every two hours? About half the time    ? How often have you found you stopped and started again several times when you urinated? Not at All    ? How often have you found it difficult to postpone urination? About half the time    ? How often have you had a weak urinary stream? Less than half the time    ? How often have you had to strain to start urination? Not at All    ? How many times did you typically get up at night to urinate? 2 Times    ? Total IPSS Score 10    ?  ? Quality of Life due to urinary symptoms  ? If you were to spend the rest of your life with your urinary condition just the way it is now how would you feel about that? Terrible    ? ?  ?  ? ?  ? ? ?  Score:  ?1-7 Mild ?8-19 Moderate ?20-35 Severe ? ? ?PMH: ?Past Medical History:  ?Diagnosis Date  ? Anxiety   ? Asthma   ? Borderline personality disorder ([place])   ? Depression   ? Dysrhythmia   ? Erectile dysfunction   ? Fatty liver   ? GERD (gastroesophageal reflux disease)   ? Hypertension   ? Hypothyroidism   ? Pulmonary nodule, right   ? Restless leg   ? Sleep apnea   ? Suicide attempt [facility])   ? ? ?Surgical History: ?Past Surgical History:  ?Procedure Laterality Date  ? AMPUTATION ARM    ? COLONOSCOPY    ? COLONOSCOPY WITH PROPOFOL N/A 08/22/2018  ? Procedure: COLONOSCOPY WITH PROPOFOL;  Surgeon: Lollie Sails, MD;  Location[facility]  ENDOSCOPY;  Service: Endoscopy;  Laterality: N/A;  ? ? ?Home Medications:  ?Allergies as of 01/01/2022   ?No Known Allergies ?  ? ?  ?Medication List  ?  ? ?  ? Accurate as of January 01, 2022 11:59 PM. If you have any questions, ask your nurse or doctor.  ?  ?  ? ?  ? ?Abilify Maintena 400 MG Srer injection ?Generic drug: ARIPiprazole ER ?Inject 400 mg into the skin every 30 (thirty) days. ?  ?albuterol 108 (90 Base) MCG/ACT inhaler ?Commonly known as: VENTOLIN HFA ?Inhale 2 puffs into the lungs every 6 (six) hours as needed for wheezing or shortness of breath. ?  ?atenolol 25 MG tablet ?Commonly known as: TENORMIN ?Take 25 mg by mouth daily. ?  ?doxazosin 8 MG tablet ?Commonly known as: CARDURA ?Take 1 tablet (8 mg total) by mouth daily. ?  ?doxepin 25 MG capsule ?Commonly known as: SINEQUAN ?Take 25 mg by mouth at bedtime. ?  ?DULoxetine 60 MG capsule ?Commonly known as: CYMBALTA ?Take 2 capsules (120 mg total) by mouth daily. ?What changed:  ?how much to take ?when to take this ?  ?ergocalciferol 1.25 MG (50000 UT) capsule ?Commonly known as: VITAMIN D2 ?Take 1 capsule by mouth once a week. ?  ?finasteride 5 MG tablet ?Commonly known as: PROSCAR ?Take 1 tablet (5 mg total) by mouth daily. ?  ?gabapentin 100 MG capsule ?Commonly known as: NEURONTIN ?Take 100 mg by mouth 3 (three) times daily. ?  ?Gemtesa 75 MG Tabs ?Generic drug: Vibegron ?Take 75 mg by mouth daily. ?  ?hydrochlorothiazide 12.5 MG capsule ?Commonly known as: MICROZIDE ?Take 1 capsule (12.5 mg total) by mouth daily. ?  ?HYDROcodone-acetaminophen 5-325 MG tablet ?Commonly known as: NORCO/VICODIN ?Take 1 tablet by mouth every 6 (six) hours as needed for moderate pain or severe pain. ?  ?lamoTRIgine 100 MG tablet ?Commonly known as: LAMICTAL ?Take 50-100 mg by mouth daily. Take 50 mg by mouth every morning and 100 mg by mouth at bedtime for two days then take 100 mg by mouth daily thereafter. ?  ?levocetirizine 5 MG tablet ?Commonly known as:  XYZAL ?Take 5 mg by mouth every evening. ?  ?levothyroxine 75 MCG tablet ?Commonly known as: SYNTHROID ?Take 1 tablet (75 mcg total) by mouth daily before breakfast. ?  ?loratadine 10 MG tablet ?Commonly known as: CLARITIN ?Take 1 tablet (10 mg total) by mouth daily. ?  ?melatonin 3 MG Tabs tablet ?Take 3 mg by mouth at bedtime as needed. ?  ?metFORMIN 500 MG tablet ?Commonly known as: GLUCOPHAGE ?Take 1 tablet (500 mg total) by mouth daily. ?  ?mirtazapine 15 MG tablet ?Commonly known as: REMERON ?Take 15 mg by mouth at bedtime. ?  ?  mirtazapine 30 MG tablet ?Commonly known as: REMERON ?Take 30 mg by mouth at bedtime. ?  ?nystatin ointment ?Commonly known as: MYCOSTATIN ?Apply 1 application topically 2 (two) times daily. ?  ?nystatin-triamcinolone ointment ?Commonly known as: MYCOLOG ?Apply 1 application topically 2 (two) times daily. ?  ?omega-3 acid ethyl esters 1 g capsule ?Commonly known as: LOVAZA ?Take 1 g by mouth daily. ?  ?omeprazole 20 MG capsule ?Commonly known as: PRILOSEC ?Take 20 mg by mouth daily. ?  ?oxybutynin 10 MG 24 hr tablet ?Commonly known as: DITROPAN-XL ?TAKE 1 TABLET(10 MG) BY MOUTH DAILY ?  ?oxybutynin 10 MG 24 hr tablet ?Commonly known as: DITROPAN-XL ?TAKE 1 TABLET(10 MG) BY MOUTH DAILY ?  ?pramipexole 1 MG tablet ?Commonly known as: MIRAPEX ?Take by mouth. ?  ?primidone 50 MG tablet ?Commonly known as: MYSOLINE ?Take by mouth at bedtime. ?  ?rOPINIRole 1 MG tablet ?Commonly known as: REQUIP ?Take 1 mg by mouth 3 (three) times daily. ?  ?rosuvastatin 10 MG tablet ?Commonly known as: CRESTOR ?Take 10 mg by mouth at bedtime. ?  ?sulfamethoxazole-trimethoprim 400-80 MG tablet ?Commonly known as: BACTRIM ?Take 1 tablet by mouth 2 (two) times daily. ?  ?tadalafil 20 MG tablet ?Commonly known as: CIALIS ?Take by mouth. ?  ?temazepam 15 MG capsule ?Commonly known as: RESTORIL ?Take 15 mg by mouth daily. ?  ?traZODone 100 MG tablet ?Commonly known as: DESYREL ?Take 100 mg by mouth at bedtime. ?   ?triamcinolone 0.025 % cream ?Commonly known as: KENALOG ?Apply 1 application topically 2 (two) times daily. ?  ? ?  ? ? ?Allergies:  ?No Known Allergies ? ?Family History: ?Family History  ?Family history unknown: Yes

## 2022-01-01 ENCOUNTER — Ambulatory Visit (INDEPENDENT_AMBULATORY_CARE_PROVIDER_SITE_OTHER): Payer: Medicare Other | Admitting: Urology

## 2022-01-01 ENCOUNTER — Encounter: Payer: Self-pay | Admitting: Urology

## 2022-01-01 VITALS — BP 116/71 | HR 70 | Ht 60.0 in | Wt 152.0 lb

## 2022-01-01 DIAGNOSIS — R350 Frequency of micturition: Secondary | ICD-10-CM | POA: Diagnosis not present

## 2022-01-01 DIAGNOSIS — N401 Enlarged prostate with lower urinary tract symptoms: Secondary | ICD-10-CM | POA: Diagnosis not present

## 2022-01-01 DIAGNOSIS — N138 Other obstructive and reflux uropathy: Secondary | ICD-10-CM

## 2022-01-01 LAB — URINALYSIS, COMPLETE
Bilirubin, UA: NEGATIVE
Glucose, UA: NEGATIVE
Ketones, UA: NEGATIVE
Nitrite, UA: NEGATIVE
Protein,UA: NEGATIVE
RBC, UA: NEGATIVE
Specific Gravity, UA: 1.025 (ref 1.005–1.030)
Urobilinogen, Ur: NEGATIVE mg/dL (ref 0.2–1.0)
pH, UA: 6 (ref 5.0–7.5)

## 2022-01-01 LAB — MICROSCOPIC EXAMINATION: Bacteria, UA: NONE SEEN

## 2022-01-01 LAB — BLADDER SCAN AMB NON-IMAGING: Scan Result: 48

## 2022-01-05 ENCOUNTER — Telehealth: Payer: Self-pay

## 2022-01-05 LAB — CULTURE, URINE COMPREHENSIVE

## 2022-01-05 MED ORDER — SULFAMETHOXAZOLE-TRIMETHOPRIM 800-160 MG PO TABS: 1.0000 | ORAL_TABLET | Freq: Two times a day (BID) | ORAL | 0 refills | Status: DC

## 2022-01-05 NOTE — Telephone Encounter (Signed)
Left patient a message and also a detailed message left on Act team Lead Alysha's number per DPR. Script sent into pharmacy ?

## 2022-01-05 NOTE — Telephone Encounter (Signed)
-----   Message from Nori Riis, PA-C sent at 01/05/2022  7:58 AM EDT ----- ?Please let Mr. Renz know that his urine culture was positive for infection and that we need for him to start Septra DS, twice daily for seven days.   ?

## 2022-01-06 NOTE — Telephone Encounter (Signed)
Tried multiple care team numbers with out success. Left detailed message on patient's sister's number per DPR ?

## 2022-01-07 ENCOUNTER — Other Ambulatory Visit: Payer: Self-pay | Admitting: *Deleted

## 2022-01-07 MED ORDER — SULFAMETHOXAZOLE-TRIMETHOPRIM 800-160 MG PO TABS: 1.0000 | ORAL_TABLET | Freq: Two times a day (BID) | ORAL | 0 refills | Status: DC

## 2022-01-07 NOTE — Telephone Encounter (Signed)
Notified patient as instructed,[facility]  ?

## 2022-03-12 ENCOUNTER — Ambulatory Visit: Payer: Medicare Other | Attending: Neurology

## 2022-03-12 DIAGNOSIS — G4719 Other hypersomnia: Secondary | ICD-10-CM | POA: Diagnosis present

## 2022-03-12 DIAGNOSIS — G4761 Periodic limb movement disorder: Secondary | ICD-10-CM | POA: Insufficient documentation

## 2022-03-12 DIAGNOSIS — R0603 Acute respiratory distress: Secondary | ICD-10-CM | POA: Insufficient documentation

## 2022-03-12 DIAGNOSIS — G4733 Obstructive sleep apnea (adult) (pediatric): Secondary | ICD-10-CM | POA: Diagnosis not present

## 2022-03-14 ENCOUNTER — Other Ambulatory Visit: Payer: Self-pay

## 2022-03-14 ENCOUNTER — Emergency Department
Admission: EM | Admit: 2022-03-14 | Discharge: 2022-03-14 | Disposition: A | Discharge status: Home / Self Care | Payer: Medicare Other | Attending: Emergency Medicine | Admitting: Emergency Medicine

## 2022-03-14 DIAGNOSIS — I10 Essential (primary) hypertension: Secondary | ICD-10-CM | POA: Diagnosis not present

## 2022-03-14 DIAGNOSIS — J449 Chronic obstructive pulmonary disease, unspecified: Secondary | ICD-10-CM | POA: Diagnosis not present

## 2022-03-14 DIAGNOSIS — E119 Type 2 diabetes mellitus without complications: Secondary | ICD-10-CM | POA: Insufficient documentation

## 2022-03-14 DIAGNOSIS — L608 Other nail disorders: Secondary | ICD-10-CM | POA: Diagnosis present

## 2022-03-14 DIAGNOSIS — L603 Nail dystrophy: Secondary | ICD-10-CM

## 2022-03-14 DIAGNOSIS — B351 Tinea unguium: Secondary | ICD-10-CM | POA: Diagnosis not present

## 2022-03-14 NOTE — ED Triage Notes (Signed)
Pt to ED for painful split fingernail, asking for provider to cut part of nail off. Provider has spoken with pt at bedside.

## 2022-03-14 NOTE — Discharge Instructions (Addendum)
You were seen today for splint nail.  Your nail split because of a fungal infection.  This is something that your PCP can treat.  Please follow-up with your PCP for further evaluation and treatment.

## 2022-03-14 NOTE — ED Provider Notes (Addendum)
   [facility] Provider Note    Event Date/Time   First MD Initiated Contact with Patient 03/14/22 0710     (approximate)   History   No chief complaint on file.   HPI  Mathew Aguilar is a 71 y.o. male with a history of hypothyroidism, DM 2, COPD, HTN and GERD presents to the ER today with complaint of a split fingernail to his left middle finger.  He noticed this yesterday.  He reports the area is not painful.  He has not noticed any redness, swelling or discharge from the area.  He denies any injury to the area.  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      Physical Exam   Triage Vital Signs: ED Triage Vitals [03/14/22 0626]  Enc Vitals Group     BP (!) 141/87     Pulse Rate 68     Resp 18     Temp 97.6 F (36.4 C)     Temp Source Oral     SpO2 97 %     Weight      Height      Head Circumference      Peak Flow      Pain Score 0     Pain Loc      Pain Edu?      Excl. in [place]?     Most recent vital signs: Vitals:   03/14/22 0626  BP: (!) 141/87  Pulse: 68  Resp: 18  Temp: 97.6 F (36.4 C)  SpO2: 97%     General: Awake, no distress.  CV:  RRR.  Radial pulse 2+ on the left.  Cap refill less than 3 seconds of the left middle finger. Resp:  Normal effort.  Clear with diminished bases. MSK:  Normal flexion and extension of the left middle finger.  No joint swelling noted. Skin:  His fingernail is split horizontally midway down the nail.  He does have fungus of the nail.   ED Results / Procedures / Treatments    MEDICATIONS ORDERED IN ED: Medications - No data to display   IMPRESSION / MDM / [place] / ED COURSE  I reviewed the triage vital signs and the nursing notes.  Splint Fingernail  Differential diagnosis includes, but is not limited to onychoschizia due to mycosis  Patient's presentation is most consistent with acute, uncomplicated illness.  Discussed  with patient that the reason his fingernail is split is because of a fungal infection He is inquiring about treatment here, advised him that is something that his PCP can handle His nail was trimmed by this provider using a pair of scissors, patient tolerated well, no complications He will follow-up with his PCP as an outpatient   FINAL CLINICAL IMPRESSION(S) / ED DIAGNOSES   Final diagnoses:  Onychoschizia  Fungal nail infection     Rx / DC Orders   ED Discharge Orders     None        Note:  This document was prepared using Dragon voice recognition software and may include unintentional dictation errors.    Jearld Fenton, NP 03/14/22 0742    Jearld Fenton, NP 03/14/22 3009    Naaman Plummer, MD 03/14/22 1004

## 2022-03-25 ENCOUNTER — Ambulatory Visit: Payer: Medicare Other | Attending: Internal Medicine

## 2022-03-25 DIAGNOSIS — R296 Repeated falls: Secondary | ICD-10-CM | POA: Insufficient documentation

## 2022-03-25 DIAGNOSIS — R262 Difficulty in walking, not elsewhere classified: Secondary | ICD-10-CM | POA: Diagnosis not present

## 2022-03-25 NOTE — Therapy (Addendum)
[place] MAIN [facility] SERVICES [address] [place], [place], [address] Phone: [phone]   Fax:  [phone]  Physical Therapy Evaluation  Patient Details  Name: Mathew Aguilar MRN: [id number] Date of Birth: [birth year] Referring Provider (PT): Dr. Harrel Lemon (PCP) Purpose for visit: Rehabilitation   Encounter Date: 03/25/2022   PT End of Session - 03/25/22 1526     Visit Number 1    Number of Visits 16    Date for PT Re-Evaluation 05/20/22    Authorization Type UHC Medicare, Medicaid secondary    Authorization Time Period 03/25/22-06/17/22    Progress Note Due on Visit 10    PT Start Time 1405    PT Stop Time 1445    PT Time Calculation (min) 40 min    Equipment Utilized During Treatment Gait belt    Activity Tolerance Patient tolerated treatment well;No increased pain    Behavior During Therapy WFL for tasks assessed/performed             Past Medical History:  Diagnosis Date   Anxiety    Asthma    Borderline personality disorder ([place])    Depression    Dysrhythmia    Erectile dysfunction    Fatty liver    GERD (gastroesophageal reflux disease)    Hypertension    Hypothyroidism    Pulmonary nodule, right    Restless leg    Sleep apnea    Suicide attempt [facility])     Past Surgical History:  Procedure Laterality Date   AMPUTATION ARM     COLONOSCOPY     COLONOSCOPY WITH PROPOFOL N/A 08/22/2018   Procedure: COLONOSCOPY WITH PROPOFOL;  Surgeon: Lollie Sails, MD;  Location: [facility] ENDOSCOPY;  Service: Endoscopy;  Laterality: N/A;    There were no vitals filed for this visit.    Subjective Assessment - 03/25/22 1515     Subjective Ptreferred here due to nonresolution of recent difficulty with walking and balance.    Pertinent History Mathew Aguilar is a 17yoM who comes to [facility] neuro referred by PCP after acute insidious onset difficulty with walking that began roughly 4 weeks ago upon waking that morning.  Pt reports his issue to solely be related to difficulty with inititation of gait. Pt reports several falls assicated with this new problem. Pt denies any history of prior gait difficult or falls, denies any current or previous changes in strength, sensorium, no history or associated diziness, no LOC, no presyncope. Pt reports only mild improvement since onset. Pt was seen by PCP in office for this issue on 02/24/22 and referred to PT for referral. No imaging has been done, no referral to other provides.    Currently in Pain? No/denies                [facility] PT Assessment - 03/25/22 0001       Assessment   Medical Diagnosis Acute insidious gait ataxia   February 24, 2022   Referring Provider (PT) Dr. Harrel Lemon (PCP)    Onset Date/Surgical Date 02/24/22    Hand Dominance Left    Prior Therapy none      Precautions   Precautions Fall      Restrictions   Weight Bearing Restrictions No      Balance Screen   Has the patient fallen in the past 6 months Yes    How many times? 8-12   estimated, pt unsure   Has the patient had a decrease  in activity level because of a fear of falling?  Yes    Is the patient reluctant to leave their home because of a fear of falling?  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 do this right now     Observation/Other Assessments   Focus on Therapeutic Outcomes (FOTO)  46            EXAMINATION: MMT: BLE Strength Assessment      Right  Left  Hip Flexion 4+/5 4+/5  Hip Horizontal ABDCT  5/5 5/5  Hip Horizontal ADD 5/5 5/5  Hip IR (seated)   4+/5 4+/5  Hip ER (seated) 5/5 5/5  Knee Extension 5/5 5/5  Knee Flexion (seated) 4+/5 4+/5  Ankle Dorsiflexion 5/5 5/5    Sensation: WNL BLE  Micrographia screening:  WNL   SLS: <1  sec bilat, supervision level Eyes closed normal stance balance: no LOB over 15 sec, low sway, high confidence Turning in a circle: >10sec, reverts shuffling, balance impaired AMB assessment: 158f c 4 turns, no LOB, then repeat with S[facility] Center[facility] Greenlight/Redlight with cues for focus on big steps  Visual assessment of Right chronic residual limb wound, not wrapped or dressed:         Objective measurements completed on examination: See above findings.       PT Education - 03/25/22 1527     Education Details Educated on tips for use of SPC and focus on taking large steps with inititation of gait    Person(s) Educated Patient    Methods Explanation;Demonstration    Comprehension Verbalized understanding;Need further instruction              PT Short Term Goals - 03/25/22 1541       PT SHORT TERM GOAL #1   Title Pt to reports decrease of falls at home <1x/month.    Baseline eval: 2-3/week since onset    Time 4    Period Weeks    Status New    Target Date 04/22/22      PT SHORT TERM GOAL #2   Title Pt to report successful use of SPC for resumption of AMB activity, rpeorts no longer limitng activity due to fear of falling.    Baseline eval: avoiding much wlaking due ot fear of falling    Time 4    Period Weeks    Status New    Target Date 04/22/22               PT Long Term Goals - 03/25/22 1543       PT LONG TERM GOAL #1   Title Pt to show improved FOTO score > 15 points to indicated improved feelings of confidence in balance    Baseline eval: 49    Time 8    Period Weeks    Status New    Target Date 05/20/22      PT LONG TERM GOAL #2   Title  Pt to demonstrate 6MWT >1459f c LRAD without any LOB at modI level to indicate ability to safely perform limited  community AMB.    Baseline eval: learning SPC use    Time 8    Period Weeks    Status New    Target Date 05/20/22      PT LONG TERM GOAL #3   Title Pt to show BBT score increase > 10 to indicated reduced risk of falls.    Baseline defered to visit 2    Time 8    Period Weeks    Status New    Target Date 05/20/22                    Plan - 03/25/22 1528     Clinical Impression Statement Examination revealing of classic festination phenomenon with inititaion of gait which pt is able to overcome with focus on exaggerated step size at inititation. Pt endorses no acute changes or impairment to gait once his gait is inititated and festination has been overcome. Pt does have some of this shuffling tendency when turning in place or making some turns in gait. Exam reveals no hemiplegia, no loss of sensation. Presence of RUE residual limb precludes extensive sensory testing of RUE, albeit he is still able to use his limb functionally without acute change. Pt is screened for micrographia which is not seen this date. Pt has no tremor, no history of tremor. Pt's presentation revealing only of shuffling phenomenon commonly seen in Parkinsonism, however given the acute onset nature of this and the lack of other common Parkinsonian symptoms, other etiology resulting in acute changes to basal ganglia cannot be ruled out. Author reached out to referring MD to relay these concerns, discuss potential need for referral to neurology an/or brain MRI. Pt educated on cues for exaggerated large steps with beginning gait, as well as how to progressively integrate a SC[facility] LLCfor training at home to reduce falls and improve SPC efficacy. Of note per pt report, it sounds as though pt has not been performing woundcare for his RUE residual limb as recommended by ID, largely due to lack of help at home.    Examination-Activity Limitations Locomotion Level;Carry    Examination-Participation Restrictions Community  Activity;Cleaning    Stability/Clinical Decision Making Stable/Uncomplicated    Clinical Decision Making High    Rehab Potential Good    PT Frequency 2x / week    PT Duration 8 weeks    PT Treatment/Interventions DME Instruction;Gait training;Stair training;Functional mobility training;Therapeutic activities;Therapeutic exercise;Balance training;Neuromuscular re-education;Patient/family education    PT Next Visit Plan Review SPC training, BBT    PT Home Exercise Plan SY[facility] LLCgettin' and training too!, focus on big steppin' with starting walk    Recommended Other Services brain MRI and/or neurology referral    Consulted and Agree with Plan of Care Patient             Patient will benefit from skilled therapeutic intervention in order to improve the following deficits and impairments:  Abnormal gait, Decreased balance, Decreased knowledge of precautions, Decreased mobility, Decreased knowledge of use of DME, Decreased skin integrity, Difficulty walking  Visit Diagnosis: Difficulty in walking, not elsewhere classified  Repeated falls     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[place] 05/30/2020   Anxiety 05/30/2020   Erectile dysfunction 05/30/2020   History of avoidant personality disorder 05/30/2020   History of dysthymic disorder 05/30/2020  History of suicide attempt 05/30/2020   Hypothyroidism, acquired 05/30/2020   Diet-controlled type 2 diabetes mellitus ([place]) 11/30/2016   Chronic insomnia 08/25/2016   COPD (chronic obstructive pulmonary disease) ([place]) 04/03/2016   Alcohol use disorder, mild, abuse 02/27/2016   Severe recurrent major depression with psychotic features ([place]) 02/27/2016   Suicidal ideation 02/03/2016   Body mass index (BMI) of 32.0-32.9 in adult 01/23/2016   Fatty liver 10/24/2015   Benign prostatic hyperplasia with lower urinary tract symptoms 04/08/2015   GERD (gastroesophageal reflux disease) 02/19/2015    Essential hypertension 02/18/2015   Restless legs syndrome 02/18/2015   3:55 PM, 03/25/22 Etta Grandchild, PT, DPT Physical Therapist - [facility]  Outpatient Physical Therapy- [place] [phone]     [place], PT 03/25/2022, 3:47 PM  [place] MAIN [facility] SERVICES [address] [place], [place], [address] Phone: [phone]   Fax:  [phone]  Name: Mathew Aguilar MRN: [id number] Date of Birth: February 14, 1951

## 2022-03-26 ENCOUNTER — Telehealth: Payer: Self-pay

## 2022-03-26 ENCOUNTER — Ambulatory Visit: Payer: Medicare Other | Admitting: Physical Therapy

## 2022-03-26 NOTE — Telephone Encounter (Signed)
Mathew Aguilar evaluated in [place] today, [name] wanted to express concerns to PCP/referring provider regarding need for additional referral to neurology and/or brain MRI based on examination findings. Author spoke to staff rep at [facility] (03/25/22 @ 15:00) and left a message with callback info.   8:14 AM, 03/26/22 Etta Grandchild, PT, DPT Physical Therapist - [place] [phone]

## 2022-03-31 ENCOUNTER — Ambulatory Visit: Payer: Medicare Other

## 2022-03-31 DIAGNOSIS — R262 Difficulty in walking, not elsewhere classified: Secondary | ICD-10-CM | POA: Diagnosis not present

## 2022-03-31 DIAGNOSIS — R296 Repeated falls: Secondary | ICD-10-CM

## 2022-03-31 NOTE — Therapy (Addendum)
[place] MAIN [facility] SERVICES [address] [place], [place], [address] Phone: [phone]   Fax:  [phone]  Physical Therapy Treatment  Patient Details  Name: Mathew Aguilar MRN: [id number] Date of Birth: 08/17/1951 Referring Provider (PT): Dr. Harrel Lemon (PCP) Purpose for visit: Rehabilitation   Encounter Date: 03/31/2022   PT End of Session - 03/31/22 1118     Visit Number 2    Number of Visits 16    Date for PT Re-Evaluation 05/20/22    Authorization Type UHC Medicare, Medicaid secondary    Authorization Time Period 03/25/22-06/17/22    Progress Note Due on Visit 10    PT Start Time 1106    PT Stop Time 1144    PT Time Calculation (min) 38 min    Equipment Utilized During Treatment Gait belt    Activity Tolerance Patient tolerated treatment well;No increased pain    Behavior During Therapy WFL for tasks assessed/performed             Past Medical History:  Diagnosis Date   Anxiety    Asthma    Borderline personality disorder ([place])    Depression    Dysrhythmia    Erectile dysfunction    Fatty liver    GERD (gastroesophageal reflux disease)    Hypertension    Hypothyroidism    Pulmonary nodule, right    Restless leg    Sleep apnea    Suicide attempt [facility])     Past Surgical History:  Procedure Laterality Date   AMPUTATION ARM     COLONOSCOPY     COLONOSCOPY WITH PROPOFOL N/A 08/22/2018   Procedure: COLONOSCOPY WITH PROPOFOL;  Surgeon: Lollie Sails, MD;  Location: [facility] ENDOSCOPY;  Service: Endoscopy;  Laterality: N/A;    There were no vitals filed for this visit.   Subjective Assessment - 03/31/22 1116     Subjective Pt reports 1 falls since last visit, began shuffling at the entrance of [place]. He did not hurt himself. HE was able to get up on his own. He has been using a cane now, practicing. His PCP called him to set up an appointment for a consult.    Pertinent History Mathew Aguilar is a 68yoM  who comes to [facility] OPPT neuro referred by PCP after acute insidious onset difficulty with walking that began roughly 4 weeks ago upon waking that morning. Pt reports his issue to solely be related to difficulty with inititation of gait. Pt reports several falls assicated with this new problem. Pt denies any history of prior gait difficult or falls, denies any current or previous changes in strength, sensorium, no history or associated diziness, no LOC, no presyncope. Pt reports only mild improvement since onset. Pt was seen by PCP in office for this issue on 02/24/22 and referred to PT for referral. No imaging has been done, no referral to other provides.    Currently in Pain? No/denies             INTERVENTION: -overground AMB with SPC in LUE, no LOB, but appears to have mild left sided weakness overtime, increased left sided scuffing of heal; 0.88ms   -STS from chair 1x10 -standing marching 1x10 bilat c 2.5lb AW -LAQ 1x15 bilat 2.5AW  -STS from chair, feet on airex (to decrease seat height) 1x10 -standing marching 1x10 bilat c 2.5lb AW -LAQ 1x15 bilat 2.5AW  -STS from chair, feet on airex (to decrease seat height) 1x10  -lateral side stepping in bars  x 2.5lbAW x4 in // bars   -in // bars fwd AMB over 3 gait belts 3x, then side stepping 3x  -in// bars steps obstacle course with The Step, 6" box, 3" airex, and 2" rocker board 1x c rail, twice without rail (minGUard)        PT Education - 03/31/22 1121     Education Details new exercises for strengthening    Person(s) Educated Patient    Methods Explanation;Demonstration    Comprehension Verbalized understanding;Need further instruction              PT Short Term Goals - 03/25/22 1541       PT SHORT TERM GOAL #1   Title Pt to reports decrease of falls at home <1x/month.    Baseline eval: 2-3/week since onset    Time 4    Period Weeks    Status New    Target Date 04/22/22      PT SHORT TERM GOAL #2   Title Pt to  report successful use of SPC for resumption of AMB activity, rpeorts no longer limitng activity due to fear of falling.    Baseline eval: avoiding much wlaking due ot fear of falling    Time 4    Period Weeks    Status New    Target Date 04/22/22               PT Long Term Goals - 03/25/22 1543       PT LONG TERM GOAL #1   Title Pt to show improved FOTO score > 15 points to indicated improved feelings of confidence in balance    Baseline eval: 49    Time 8    Period Weeks    Status New    Target Date 05/20/22      PT LONG TERM GOAL #2   Title Pt to demonstrate 6MWT >1430f c LRAD without any LOB at modI level to indicate ability to safely perform limited community AMB.    Baseline eval: learning SPC use    Time 8    Period Weeks    Status New    Target Date 05/20/22      PT LONG TERM GOAL #3   Title Pt to show BBT score increase > 10 to indicated reduced risk of falls.    Baseline defered to visit 2    Time 8    Period Weeks    Status New    Target Date 05/20/22                   Plan - 03/31/22 1122     Clinical Impression Statement Commenced global strengthening regimen today to address MMT deficits at eval. Good tolerance overall, no LOB seen during gait. Pt using SPC appropriately for gait. No pain in session. Per pot report, sounds like pt is in process of scheduling with neurology.    Examination-Activity Limitations Locomotion Level;Carry    Examination-Participation Restrictions Community Activity;Cleaning    Stability/Clinical Decision Making Stable/Uncomplicated    Clinical Decision Making High    Rehab Potential Good    PT Frequency 2x / week    PT Duration 8 weeks    PT Treatment/Interventions DME Instruction;Gait training;Stair training;Functional mobility training;Therapeutic activities;Therapeutic exercise;Balance training;Neuromuscular re-education;Patient/family education    PT [facility]    PT Home Exercise  [facility] focus on big steppin' with starting walk    Consulted and Agree  with Plan of Care Patient             Patient will benefit from skilled therapeutic intervention in order to improve the following deficits and impairments:  Abnormal gait, Decreased balance, Decreased knowledge of precautions, Decreased mobility, Decreased knowledge of use of DME, Decreased skin integrity, Difficulty walking  Visit Diagnosis: Difficulty in walking, not elsewhere classified  Repeated falls     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[place]) 05/30/2020   Anxiety 05/30/2020   Erectile dysfunction 05/30/2020   History of avoidant personality disorder 05/30/2020   History of dysthymic disorder 05/30/2020   History of suicide attempt 05/30/2020   Hypothyroidism, acquired 05/30/2020   Diet-controlled type 2 diabetes mellitus ([place]) 11/30/2016   Chronic insomnia 08/25/2016   COPD (chronic obstructive pulmonary disease) ([place]) 04/03/2016   Alcohol use disorder, mild, abuse 02/27/2016   Severe recurrent major depression with psychotic features ([place]) 02/27/2016   Suicidal ideation 02/03/2016   Body mass index (BMI) of 32.0-32.9 in adult 01/23/2016   Fatty liver 10/24/2015   Benign prostatic hyperplasia with lower urinary tract symptoms 04/08/2015   GERD (gastroesophageal reflux disease) 02/19/2015   Essential hypertension 02/18/2015   Restless legs syndrome 02/18/2015   11:40 AM, 03/31/22 Etta Grandchild, PT, DPT Physical Therapist - [facility]  Outpatient Physical Therapy- [place] [phone]     [place], PT 03/31/2022, 11:25 AM  [place] MAIN [facility] SERVICES [address] [place], [place], [address] Phone: [phone]   Fax:  [phone]  Name: Mathew Aguilar MRN: [id number] Date of Birth: 1951-04-03

## 2022-04-01 NOTE — Progress Notes (Deleted)
04/01/22 1:31 PM   Mathew Aguilar 02/18/1951 [id number]  Referring provider:  Baxter Hire, MD [place],  [place] [id number]  Urological history  1. BPH with LU TS - PSA 0.41 11/2021 - cysto 2020 - Normal prostate without significant coaptation, some bleeding with manipulation - Calculated prostate volume in 2016 was 69.8 based on sizes of 5.09 x 5.47 x 4.57 cm on CT scan - I PSS 10/6 - managed with doxazosin 8 mg daily and finasteride 5 mg daily   2. Urgency -Contributing factors of age, BPH, carbonated drinks, artificial sweeteners, caffeine, hypertension, diabetes, alcohol consumption, depression, diuretics, COPD and restless leg - cysto 06/2019 with Dr. Erlene Quan No significant urinary obstruction visually on cystoscopy.  No underlying pathology including tumors, masses, lesions or stones - PVR 48 mL - managed with oxybutynin XL 10 mg daily and Gemtesa 75 mg daily   3. Right renal complex cyst - CT w/wo 2016 right mid kidney lesion remains indistinct, and is too small to technically characterize. It may well be complex, but I cannot tell if it is enhancing and accordingly I can't confidently assign a Bosniak classification. The lack of significant progression compared to 04/09/13 is somewhat reassuring against malignancy - RUS 06/2019 Normal renal ultrasound  4. ED -Contributing factors of age, BPH, hypertension, diabetes, alcohol consumption, depression, COPD, alcohol consumption, blood pressure medication and BPH medication -Managed with tadalafil 20 mg on demand dosing  No chief complaint on file.     HPI: Mathew Aguilar is a 71 y.o.male who presents today for a 3 month follow-up after decreasing his fluid intake to see if it would make an improvement in his urgency/frequency.  At his visit 3 months ago, he still had not seen improvement with his urgency and frequency even though he is on maximum medical therapy.  We discussed undergoing  urodynamics, but he deferred as he wanted to try reducing his fluid intake to see if it would decrease his urgency/frequency.    Score:  1-7 Mild 8-19 Moderate 20-35 Severe   PMH: Past Medical History:  Diagnosis Date   Anxiety    Asthma    Borderline personality disorder ([place])    Depression    Dysrhythmia    Erectile dysfunction    Fatty liver    GERD (gastroesophageal reflux disease)    Hypertension    Hypothyroidism    Pulmonary nodule, right    Restless leg    Sleep apnea    Suicide attempt [facility])     Surgical History: Past Surgical History:  Procedure Laterality Date   AMPUTATION ARM     COLONOSCOPY     COLONOSCOPY WITH PROPOFOL N/A 08/22/2018   Procedure: COLONOSCOPY WITH PROPOFOL;  Surgeon: Lollie Sails, MD;  Location: [facility] ENDOSCOPY;  Service: Endoscopy;  Laterality: N/A;    Home Medications:  Allergies as of 04/02/2022   No Known Allergies      Medication List        Accurate as of April 01, 2022  1:31 PM. If you have any questions, ask your nurse or doctor.          Abilify Maintena 400 MG Srer injection Generic drug: ARIPiprazole ER Inject 400 mg into the skin every 30 (thirty) days.   albuterol 108 (90 Base) MCG/ACT inhaler Commonly known as: VENTOLIN HFA Inhale 2 puffs into the lungs every 6 (six) hours as needed for wheezing or shortness of breath.   atenolol 25 MG tablet Commonly known  as: TENORMIN Take 25 mg by mouth daily.   doxazosin 8 MG tablet Commonly known as: CARDURA Take 1 tablet (8 mg total) by mouth daily.   doxepin 25 MG capsule Commonly known as: SINEQUAN Take 25 mg by mouth at bedtime.   DULoxetine 60 MG capsule Commonly known as: CYMBALTA Take 2 capsules (120 mg total) by mouth daily. What changed:  how much to take when to take this   ergocalciferol 1.25 MG (50000 UT) capsule Commonly known as: VITAMIN D2 Take 1 capsule by mouth once a week.   finasteride 5 MG tablet Commonly known as: PROSCAR Take  1 tablet (5 mg total) by mouth daily.   gabapentin 100 MG capsule Commonly known as: NEURONTIN Take 100 mg by mouth 3 (three) times daily.   Gemtesa 75 MG Tabs Generic drug: Vibegron Take 75 mg by mouth daily.   hydrochlorothiazide 12.5 MG capsule Commonly known as: MICROZIDE Take 1 capsule (12.5 mg total) by mouth daily.   HYDROcodone-acetaminophen 5-325 MG tablet Commonly known as: NORCO/VICODIN Take 1 tablet by mouth every 6 (six) hours as needed for moderate pain or severe pain.   lamoTRIgine 100 MG tablet Commonly known as: LAMICTAL Take 50-100 mg by mouth daily. Take 50 mg by mouth every morning and 100 mg by mouth at bedtime for two days then take 100 mg by mouth daily thereafter.   levocetirizine 5 MG tablet Commonly known as: XYZAL Take 5 mg by mouth every evening.   levothyroxine 75 MCG tablet Commonly known as: SYNTHROID Take 1 tablet (75 mcg total) by mouth daily before breakfast.   loratadine 10 MG tablet Commonly known as: CLARITIN Take 1 tablet (10 mg total) by mouth daily.   melatonin 3 MG Tabs tablet Take 3 mg by mouth at bedtime as needed.   metFORMIN 500 MG tablet Commonly known as: GLUCOPHAGE Take 1 tablet (500 mg total) by mouth daily.   mirtazapine 15 MG tablet Commonly known as: REMERON Take 15 mg by mouth at bedtime.   mirtazapine 30 MG tablet Commonly known as: REMERON Take 30 mg by mouth at bedtime.   nystatin ointment Commonly known as: MYCOSTATIN Apply 1 application topically 2 (two) times daily.   nystatin-triamcinolone ointment Commonly known as: MYCOLOG Apply 1 application topically 2 (two) times daily.   omega-3 acid ethyl esters 1 g capsule Commonly known as: LOVAZA Take 1 g by mouth daily.   omeprazole 20 MG capsule Commonly known as: PRILOSEC Take 20 mg by mouth daily.   oxybutynin 10 MG 24 hr tablet Commonly known as: DITROPAN-XL TAKE 1 TABLET(10 MG) BY MOUTH DAILY   oxybutynin 10 MG 24 hr tablet Commonly known  as: DITROPAN-XL TAKE 1 TABLET(10 MG) BY MOUTH DAILY   pramipexole 1 MG tablet Commonly known as: MIRAPEX Take by mouth.   primidone 50 MG tablet Commonly known as: MYSOLINE Take by mouth at bedtime.   rOPINIRole 1 MG tablet Commonly known as: REQUIP Take 1 mg by mouth 3 (three) times daily.   rosuvastatin 10 MG tablet Commonly known as: CRESTOR Take 10 mg by mouth at bedtime.   sulfamethoxazole-trimethoprim 800-160 MG tablet Commonly known as: BACTRIM DS Take 1 tablet by mouth every 12 (twelve) hours.   tadalafil 20 MG tablet Commonly known as: CIALIS Take by mouth.   temazepam 15 MG capsule Commonly known as: RESTORIL Take 15 mg by mouth daily.   traZODone 100 MG tablet Commonly known as: DESYREL Take 100 mg by mouth at bedtime.  triamcinolone 0.025 % cream Commonly known as: KENALOG Apply 1 application topically 2 (two) times daily.        Allergies:  No Known Allergies  Family History: Family History  Family history unknown: Yes    Social History:  reports that he has never smoked. His smokeless tobacco use includes chew. He reports current alcohol use. He reports that he does not use drugs.   Physical Exam: There were no vitals taken for this visit.  Constitutional:  Well nourished. Alert and oriented, No acute distress. HEENT: [place] AT, moist mucus membranes.  Trachea midline Cardiovascular: No clubbing, cyanosis, or edema. Respiratory: Normal respiratory effort, no increased work of breathing. GU: No CVA tenderness.  No bladder fullness or masses.  Patient with circumcised/uncircumcised phallus. ***Foreskin easily retracted***  Urethral meatus is patent.  No penile discharge. No penile lesions or rashes. Scrotum without lesions, cysts, rashes and/or edema.  Testicles are located scrotally bilaterally. No masses are appreciated in the testicles. Left and right epididymis are normal. Rectal: Patient with  normal sphincter tone. Anus and perineum without  scarring or rashes. No rectal masses are appreciated. Prostate is approximately *** grams, *** nodules are appreciated. Seminal vesicles are normal. Neurologic: Grossly intact, no focal deficits, moving all 4 extremities. Psychiatric: Normal mood and affect.   Laboratory Data: Glucose 70 - 110 mg/dL 109   Sodium 136 - 145 mmol/L 137   Potassium 3.6 - 5.1 mmol/L 4.3   Chloride 97 - 109 mmol/L 103   Carbon Dioxide (CO2) 22.0 - 32.0 mmol/L 24.5   Calcium 8.7 - 10.3 mg/dL 9.5   Urea Nitrogen (BUN) 7 - 25 mg/dL 16   Creatinine 0.7 - 1.3 mg/dL 0.6 Low    Glomerular Filtration Rate (eGFR), MDRD Estimate >60 mL/min/1.73sq m 133   BUN/Crea Ratio 6.0 - 20.0 26.7 High    Anion Gap w/K 6.0 - 16.0 13.8   Resulting Agency  [place] - LAB   Specimen Collected: 03/30/22 08:11   Performed by: [facility] Last Resulted: 03/30/22 11:06  Received From: [place]  Result Received: 03/31/22 07:44   Hemoglobin A1C 4.2 - 5.6 % 6.6 High    Average Blood Glucose (Calc) mg/dL 143   Resulting [place] - LAB  Narrative Performed by [facility] - LAB Normal Range:    4.2 - 5.6%  Increased Risk:  5.7 - 6.4%  Diabetes:        >= 6.5%  Glycemic Control for adults with diabetes:  <7%    Specimen Collected: 03/30/22 08:11   Performed by: [place] [place]: 03/30/22 09:37  Received From: [facility]  Result Received: 03/31/22 07:44   Thyroid Stimulating Hormone (TSH) 0.450-5.330 uIU/ml uIU/mL 2.179   Resulting Agency  [place] - LAB   Specimen Collected: 03/30/22 08:11   Performed by: [place]: 03/30/22 10:11  Received From: [place]  Result Received: 03/31/22 07:44  I have reviewed the labs.    Pertinent Imaging: ***  Assessment & Plan:    1. BPH with LUTS -***  2. Urgency/frequency -***  No follow-ups on  file.  Zara Council, PA-C   [facility] [address], [place], [place] [address] [phone]

## 2022-04-02 ENCOUNTER — Ambulatory Visit: Payer: Medicare Other | Admitting: Urology

## 2022-04-02 ENCOUNTER — Encounter: Payer: Self-pay | Admitting: Urology

## 2022-04-02 ENCOUNTER — Ambulatory Visit: Payer: Medicare Other | Admitting: Physical Therapy

## 2022-04-02 DIAGNOSIS — R350 Frequency of micturition: Secondary | ICD-10-CM

## 2022-04-02 DIAGNOSIS — N138 Other obstructive and reflux uropathy: Secondary | ICD-10-CM

## 2022-04-02 NOTE — Progress Notes (Incomplete)
04/02/22 7:05 AM   Mathew Aguilar 10/30/1950 [id number]  Referring provider:  Baxter Hire, MD [place],   [id number]  Urological history  1. BPH with LU TS - PSA 0.41 11/2021 - cysto 2020 - Normal prostate without significant coaptation, some bleeding with manipulation - Calculated prostate volume in 2016 was 69.8 based on sizes of 5.09 x 5.47 x 4.57 cm on CT scan - I PSS 10/6 - managed with doxazosin 8 mg daily and finasteride 5 mg daily   2. Urgency -Contributing factors of age, BPH, carbonated drinks, artificial sweeteners, caffeine, hypertension, diabetes, alcohol consumption, depression, diuretics, COPD and restless leg - cysto 06/2019 with Dr. Erlene Quan No significant urinary obstruction visually on cystoscopy.  No underlying pathology including tumors, masses, lesions or stones - PVR 48 mL - managed with oxybutynin XL 10 mg daily and Gemtesa 75 mg daily   3. Right renal complex cyst - CT w/wo 2016 right mid kidney lesion remains indistinct, and is too small to technically characterize. It may well be complex, but I cannot tell if it is enhancing and accordingly I can't confidently assign a Bosniak classification. The lack of significant progression compared to 04/09/13 is somewhat reassuring against malignancy - RUS 06/2019 Normal renal ultrasound  4. ED -Contributing factors of age, BPH, hypertension, diabetes, alcohol consumption, depression, COPD, alcohol consumption, blood pressure medication and BPH medication -Managed with tadalafil 20 mg on demand dosing  No chief complaint on file.     HPI: Mathew Aguilar is a 71 y.o.male who presents today for a 3 month follow-up after decreasing his fluid intake to see if it would make an improvement in his urgency/frequency.  At his visit 3 months ago, he still had not seen improvement with his urgency and frequency even though he is on maximum medical therapy.  We discussed undergoing  urodynamics, but he deferred as he wanted to try reducing his fluid intake to see if it would decrease his urgency/frequency.    Score:  1-7 Mild 8-19 Moderate 20-35 Severe   PMH: Past Medical History:  Diagnosis Date   Anxiety    Asthma    Borderline personality disorder ([place])    Depression    Dysrhythmia    Erectile dysfunction    Fatty liver    GERD (gastroesophageal reflux disease)    Hypertension    Hypothyroidism    Pulmonary nodule, right    Restless leg    Sleep apnea    Suicide attempt [facility])     Surgical History: Past Surgical History:  Procedure Laterality Date   AMPUTATION ARM     COLONOSCOPY     COLONOSCOPY WITH PROPOFOL N/A 08/22/2018   Procedure: COLONOSCOPY WITH PROPOFOL;  Surgeon: Lollie Sails, MD;  Location: [facility] ENDOSCOPY;  Service: Endoscopy;  Laterality: N/A;    Home Medications:  Allergies as of 04/02/2022   No Known Allergies      Medication List        Accurate as of April 02, 2022  7:05 AM. If you have any questions, ask your nurse or doctor.          Abilify Maintena 400 MG Srer injection Generic drug: ARIPiprazole ER Inject 400 mg into the skin every 30 (thirty) days.   albuterol 108 (90 Base) MCG/ACT inhaler Commonly known as: VENTOLIN HFA Inhale 2 puffs into the lungs every 6 (six) hours as needed for wheezing or shortness of breath.   atenolol 25 MG tablet Commonly known  as: TENORMIN Take 25 mg by mouth daily.   doxazosin 8 MG tablet Commonly known as: CARDURA Take 1 tablet (8 mg total) by mouth daily.   doxepin 25 MG capsule Commonly known as: SINEQUAN Take 25 mg by mouth at bedtime.   DULoxetine 60 MG capsule Commonly known as: CYMBALTA Take 2 capsules (120 mg total) by mouth daily. What changed:  how much to take when to take this   ergocalciferol 1.25 MG (50000 UT) capsule Commonly known as: VITAMIN D2 Take 1 capsule by mouth once a week.   finasteride 5 MG tablet Commonly known as: PROSCAR Take  1 tablet (5 mg total) by mouth daily.   gabapentin 100 MG capsule Commonly known as: NEURONTIN Take 100 mg by mouth 3 (three) times daily.   Gemtesa 75 MG Tabs Generic drug: Vibegron Take 75 mg by mouth daily.   hydrochlorothiazide 12.5 MG capsule Commonly known as: MICROZIDE Take 1 capsule (12.5 mg total) by mouth daily.   HYDROcodone-acetaminophen 5-325 MG tablet Commonly known as: NORCO/VICODIN Take 1 tablet by mouth every 6 (six) hours as needed for moderate pain or severe pain.   lamoTRIgine 100 MG tablet Commonly known as: LAMICTAL Take 50-100 mg by mouth daily. Take 50 mg by mouth every morning and 100 mg by mouth at bedtime for two days then take 100 mg by mouth daily thereafter.   levocetirizine 5 MG tablet Commonly known as: XYZAL Take 5 mg by mouth every evening.   levothyroxine 75 MCG tablet Commonly known as: SYNTHROID Take 1 tablet (75 mcg total) by mouth daily before breakfast.   loratadine 10 MG tablet Commonly known as: CLARITIN Take 1 tablet (10 mg total) by mouth daily.   melatonin 3 MG Tabs tablet Take 3 mg by mouth at bedtime as needed.   metFORMIN 500 MG tablet Commonly known as: GLUCOPHAGE Take 1 tablet (500 mg total) by mouth daily.   mirtazapine 15 MG tablet Commonly known as: REMERON Take 15 mg by mouth at bedtime.   mirtazapine 30 MG tablet Commonly known as: REMERON Take 30 mg by mouth at bedtime.   nystatin ointment Commonly known as: MYCOSTATIN Apply 1 application topically 2 (two) times daily.   nystatin-triamcinolone ointment Commonly known as: MYCOLOG Apply 1 application topically 2 (two) times daily.   omega-3 acid ethyl esters 1 g capsule Commonly known as: LOVAZA Take 1 g by mouth daily.   omeprazole 20 MG capsule Commonly known as: PRILOSEC Take 20 mg by mouth daily.   oxybutynin 10 MG 24 hr tablet Commonly known as: DITROPAN-XL TAKE 1 TABLET(10 MG) BY MOUTH DAILY   oxybutynin 10 MG 24 hr tablet Commonly known  as: DITROPAN-XL TAKE 1 TABLET(10 MG) BY MOUTH DAILY   pramipexole 1 MG tablet Commonly known as: MIRAPEX Take by mouth.   primidone 50 MG tablet Commonly known as: MYSOLINE Take by mouth at bedtime.   rOPINIRole 1 MG tablet Commonly known as: REQUIP Take 1 mg by mouth 3 (three) times daily.   rosuvastatin 10 MG tablet Commonly known as: CRESTOR Take 10 mg by mouth at bedtime.   sulfamethoxazole-trimethoprim 800-160 MG tablet Commonly known as: BACTRIM DS Take 1 tablet by mouth every 12 (twelve) hours.   tadalafil 20 MG tablet Commonly known as: CIALIS Take by mouth.   temazepam 15 MG capsule Commonly known as: RESTORIL Take 15 mg by mouth daily.   traZODone 100 MG tablet Commonly known as: DESYREL Take 100 mg by mouth at bedtime.  triamcinolone 0.025 % cream Commonly known as: KENALOG Apply 1 application topically 2 (two) times daily.        Allergies:  No Known Allergies  Family History: Family History  Family history unknown: Yes    Social History:  reports that he has never smoked. His smokeless tobacco use includes chew. He reports current alcohol use. He reports that he does not use drugs.   Physical Exam: There were no vitals taken for this visit.  Constitutional:  Well nourished. Alert and oriented, No acute distress. HEENT: [place] AT, moist mucus membranes.  Trachea midline Cardiovascular: No clubbing, cyanosis, or edema. Respiratory: Normal respiratory effort, no increased work of breathing. GU: No CVA tenderness.  No bladder fullness or masses.  Patient with circumcised/uncircumcised phallus. ***Foreskin easily retracted***  Urethral meatus is patent.  No penile discharge. No penile lesions or rashes. Scrotum without lesions, cysts, rashes and/or edema.  Testicles are located scrotally bilaterally. No masses are appreciated in the testicles. Left and right epididymis are normal. Rectal: Patient with  normal sphincter tone. Anus and perineum without  scarring or rashes. No rectal masses are appreciated. Prostate is approximately *** grams, *** nodules are appreciated. Seminal vesicles are normal. Neurologic: Grossly intact, no focal deficits, moving all 4 extremities. Psychiatric: Normal mood and affect.   Laboratory Data: Glucose 70 - 110 mg/dL 109   Sodium 136 - 145 mmol/L 137   Potassium 3.6 - 5.1 mmol/L 4.3   Chloride 97 - 109 mmol/L 103   Carbon Dioxide (CO2) 22.0 - 32.0 mmol/L 24.5   Calcium 8.7 - 10.3 mg/dL 9.5   Urea Nitrogen (BUN) 7 - 25 mg/dL 16   Creatinine 0.7 - 1.3 mg/dL 0.6 Low    Glomerular Filtration Rate (eGFR), MDRD Estimate >60 mL/min/1.73sq m 133   BUN/Crea Ratio 6.0 - 20.0 26.7 High    Anion Gap w/K 6.0 - 16.0 13.8   Resulting Agency  [facility] - LAB   Specimen Collected: 03/30/22 08:11   Performed by: [facility] Last Resulted: 03/30/22 11:06  Received From: [facility]  Result Received: 03/31/22 07:44   Hemoglobin A1C 4.2 - 5.6 % 6.6 High    Average Blood Glucose (Calc) mg/dL 143   Resulting [facility] - LAB  Narrative Performed by [facility] - LAB Normal Range:    4.2 - 5.6%  Increased Risk:  5.7 - 6.4%  Diabetes:        >= 6.5%  Glycemic Control for adults with diabetes:  <7%    Specimen Collected: 03/30/22 08:11   Performed by: [facility]: 03/30/22 09:37  Received From: [facility]  Result Received: 03/31/22 07:44   Thyroid Stimulating Hormone (TSH) 0.450-5.330 uIU/ml uIU/mL 2.179   Resulting Agency  [facility] - LAB   Specimen Collected: 03/30/22 08:11   Performed by: [facility]: 03/30/22 10:11  Received From: [facility]  Result Received: 03/31/22 07:44  I have reviewed the labs.    Pertinent Imaging: ***  Assessment & Plan:    1. BPH with LUTS -***  2. Urgency/frequency -***  No follow-ups on  file.  [facility] [address], [place], [place] [address] [phone]

## 2022-04-06 ENCOUNTER — Ambulatory Visit: Payer: Medicare Other | Admitting: Physical Therapy

## 2022-04-06 ENCOUNTER — Telehealth: Payer: Self-pay | Admitting: Physical Therapy

## 2022-04-06 NOTE — Telephone Encounter (Signed)
Called patient regarding missed PT appointment on 04/06/22- He stated he had another MD appointment.  Confirmed patient will be at next appointment on Wednesday 7/26 at 2:30 PM  Thank you for this referral. Hillis Range, PT, DPT 04/06/22 9:57 AM

## 2022-04-08 ENCOUNTER — Ambulatory Visit: Payer: Medicare Other | Admitting: Physical Therapy

## 2022-04-14 ENCOUNTER — Ambulatory Visit: Payer: Medicare Other | Attending: Internal Medicine | Admitting: Physical Therapy

## 2022-04-14 ENCOUNTER — Telehealth: Payer: Self-pay | Admitting: Physical Therapy

## 2022-04-14 DIAGNOSIS — R262 Difficulty in walking, not elsewhere classified: Secondary | ICD-10-CM | POA: Insufficient documentation

## 2022-04-14 DIAGNOSIS — R2681 Unsteadiness on feet: Secondary | ICD-10-CM | POA: Insufficient documentation

## 2022-04-14 DIAGNOSIS — R296 Repeated falls: Secondary | ICD-10-CM | POA: Insufficient documentation

## 2022-04-14 DIAGNOSIS — R269 Unspecified abnormalities of gait and mobility: Secondary | ICD-10-CM | POA: Insufficient documentation

## 2022-04-14 NOTE — Progress Notes (Deleted)
04/14/22 6:16 PM   Marquette Saa 1951-08-25 [id number]  Referring provider:  Baxter Hire, MD [place],  [place] [address]  Urological history  1. BPH with LU TS - PSA 0.41 11/2021 - cysto 2020 - Normal prostate without significant coaptation, some bleeding with manipulation - Calculated prostate volume in 2016 was 69.8 based on sizes of 5.09 x 5.47 x 4.57 cm on CT scan - I PSS 10/6 - managed with doxazosin 8 mg daily and finasteride 5 mg daily   2. Urgency -Contributing factors of age, BPH, carbonated drinks, artificial sweeteners, caffeine, hypertension, diabetes, alcohol consumption, depression, diuretics, COPD and restless leg - cysto 06/2019 with Dr. Erlene Quan No significant urinary obstruction visually on cystoscopy.  No underlying pathology including tumors, masses, lesions or stones - PVR 48 mL - managed with oxybutynin XL 10 mg daily and Gemtesa 75 mg daily   3. Right renal complex cyst - CT w/wo 2016 right mid kidney lesion remains indistinct, and is too small to technically characterize. It may well be complex, but I cannot tell if it is enhancing and accordingly I can't confidently assign a Bosniak classification. The lack of significant progression compared to 04/09/13 is somewhat reassuring against malignancy - RUS 06/2019 Normal renal ultrasound  4. ED -Contributing factors of age, BPH, hypertension, diabetes, alcohol consumption, depression, COPD, alcohol consumption, blood pressure medication and BPH medication -Managed with tadalafil 20 mg on demand dosing  No chief complaint on file.     HPI: Mathew Aguilar is a 71 y.o.male who presents today for a 3 month follow-up after decreasing his fluid intake to see if it would make an improvement in his urgency/frequency.  At his visit 3 months ago, he still had not seen improvement with his urgency and frequency even though he is on maximum medical therapy.  We discussed undergoing  urodynamics, but he deferred as he wanted to try reducing his fluid intake to see if it would decrease his urgency/frequency.    Score:  1-7 Mild 8-19 Moderate 20-35 Severe   PMH: Past Medical History:  Diagnosis Date   Anxiety    Asthma    Borderline personality disorder ([place])    Depression    Dysrhythmia    Erectile dysfunction    Fatty liver    GERD (gastroesophageal reflux disease)    Hypertension    Hypothyroidism    Pulmonary nodule, right    Restless leg    Sleep apnea    Suicide attempt [facility])     Surgical History: Past Surgical History:  Procedure Laterality Date   AMPUTATION ARM     COLONOSCOPY     COLONOSCOPY WITH PROPOFOL N/A 08/22/2018   Procedure: COLONOSCOPY WITH PROPOFOL;  Surgeon: Lollie Sails, MD;  Location: [facility] ENDOSCOPY;  Service: Endoscopy;  Laterality: N/A;    Home Medications:  Allergies as of 04/15/2022   No Known Allergies      Medication List        Accurate as of April 14, 2022  6:16 PM. If you have any questions, ask your nurse or doctor.          Abilify Maintena 400 MG Srer injection Generic drug: ARIPiprazole ER Inject 400 mg into the skin every 30 (thirty) days.   albuterol 108 (90 Base) MCG/ACT inhaler Commonly known as: VENTOLIN HFA Inhale 2 puffs into the lungs every 6 (six) hours as needed for wheezing or shortness of breath.   atenolol 25 MG tablet Commonly known  as: TENORMIN Take 25 mg by mouth daily.   doxazosin 8 MG tablet Commonly known as: CARDURA Take 1 tablet (8 mg total) by mouth daily.   doxepin 25 MG capsule Commonly known as: SINEQUAN Take 25 mg by mouth at bedtime.   DULoxetine 60 MG capsule Commonly known as: CYMBALTA Take 2 capsules (120 mg total) by mouth daily. What changed:  how much to take when to take this   ergocalciferol 1.25 MG (50000 UT) capsule Commonly known as: VITAMIN D2 Take 1 capsule by mouth once a week.   finasteride 5 MG tablet Commonly known as: PROSCAR Take  1 tablet (5 mg total) by mouth daily.   gabapentin 100 MG capsule Commonly known as: NEURONTIN Take 100 mg by mouth 3 (three) times daily.   Gemtesa 75 MG Tabs Generic drug: Vibegron Take 75 mg by mouth daily.   hydrochlorothiazide 12.5 MG capsule Commonly known as: MICROZIDE Take 1 capsule (12.5 mg total) by mouth daily.   HYDROcodone-acetaminophen 5-325 MG tablet Commonly known as: NORCO/VICODIN Take 1 tablet by mouth every 6 (six) hours as needed for moderate pain or severe pain.   lamoTRIgine 100 MG tablet Commonly known as: LAMICTAL Take 50-100 mg by mouth daily. Take 50 mg by mouth every morning and 100 mg by mouth at bedtime for two days then take 100 mg by mouth daily thereafter.   levocetirizine 5 MG tablet Commonly known as: XYZAL Take 5 mg by mouth every evening.   levothyroxine 75 MCG tablet Commonly known as: SYNTHROID Take 1 tablet (75 mcg total) by mouth daily before breakfast.   loratadine 10 MG tablet Commonly known as: CLARITIN Take 1 tablet (10 mg total) by mouth daily.   melatonin 3 MG Tabs tablet Take 3 mg by mouth at bedtime as needed.   metFORMIN 500 MG tablet Commonly known as: GLUCOPHAGE Take 1 tablet (500 mg total) by mouth daily.   mirtazapine 15 MG tablet Commonly known as: REMERON Take 15 mg by mouth at bedtime.   mirtazapine 30 MG tablet Commonly known as: REMERON Take 30 mg by mouth at bedtime.   nystatin ointment Commonly known as: MYCOSTATIN Apply 1 application topically 2 (two) times daily.   nystatin-triamcinolone ointment Commonly known as: MYCOLOG Apply 1 application topically 2 (two) times daily.   omega-3 acid ethyl esters 1 g capsule Commonly known as: LOVAZA Take 1 g by mouth daily.   omeprazole 20 MG capsule Commonly known as: PRILOSEC Take 20 mg by mouth daily.   oxybutynin 10 MG 24 hr tablet Commonly known as: DITROPAN-XL TAKE 1 TABLET(10 MG) BY MOUTH DAILY   oxybutynin 10 MG 24 hr tablet Commonly known  as: DITROPAN-XL TAKE 1 TABLET(10 MG) BY MOUTH DAILY   pramipexole 1 MG tablet Commonly known as: MIRAPEX Take by mouth.   primidone 50 MG tablet Commonly known as: MYSOLINE Take by mouth at bedtime.   rOPINIRole 1 MG tablet Commonly known as: REQUIP Take 1 mg by mouth 3 (three) times daily.   rosuvastatin 10 MG tablet Commonly known as: CRESTOR Take 10 mg by mouth at bedtime.   sulfamethoxazole-trimethoprim 800-160 MG tablet Commonly known as: BACTRIM DS Take 1 tablet by mouth every 12 (twelve) hours.   tadalafil 20 MG tablet Commonly known as: CIALIS Take by mouth.   temazepam 15 MG capsule Commonly known as: RESTORIL Take 15 mg by mouth daily.   traZODone 100 MG tablet Commonly known as: DESYREL Take 100 mg by mouth at bedtime.  triamcinolone 0.025 % cream Commonly known as: KENALOG Apply 1 application topically 2 (two) times daily.        Allergies:  No Known Allergies  Family History: Family History  Family history unknown: Yes    Social History:  reports that he has never smoked. His smokeless tobacco use includes chew. He reports current alcohol use. He reports that he does not use drugs.   Physical Exam: There were no vitals taken for this visit.  Constitutional:  Well nourished. Alert and oriented, No acute distress. HEENT: [place] AT, moist mucus membranes.  Trachea midline Cardiovascular: No clubbing, cyanosis, or edema. Respiratory: Normal respiratory effort, no increased work of breathing. GU: No CVA tenderness.  No bladder fullness or masses.  Patient with circumcised/uncircumcised phallus. ***Foreskin easily retracted***  Urethral meatus is patent.  No penile discharge. No penile lesions or rashes. Scrotum without lesions, cysts, rashes and/or edema.  Testicles are located scrotally bilaterally. No masses are appreciated in the testicles. Left and right epididymis are normal. Rectal: Patient with  normal sphincter tone. Anus and perineum without  scarring or rashes. No rectal masses are appreciated. Prostate is approximately *** grams, *** nodules are appreciated. Seminal vesicles are normal. Neurologic: Grossly intact, no focal deficits, moving all 4 extremities. Psychiatric: Normal mood and affect.   Laboratory Data: Glucose 70 - 110 mg/dL 109   Sodium 136 - 145 mmol/L 137   Potassium 3.6 - 5.1 mmol/L 4.3   Chloride 97 - 109 mmol/L 103   Carbon Dioxide (CO2) 22.0 - 32.0 mmol/L 24.5   Calcium 8.7 - 10.3 mg/dL 9.5   Urea Nitrogen (BUN) 7 - 25 mg/dL 16   Creatinine 0.7 - 1.3 mg/dL 0.6 Low    Glomerular Filtration Rate (eGFR), MDRD Estimate >60 mL/min/1.73sq m 133   BUN/Crea Ratio 6.0 - 20.0 26.7 High    Anion Gap w/K 6.0 - 16.0 13.8   Resulting Agency  [facility] - [facility]   Specimen Collected: 03/30/22 08:11   Performed by: [facility] - [facility] Last Resulted: 03/30/22 11:06  Received From: [facility]  Result Received: 03/31/22 07:44   Hemoglobin A1C 4.2 - 5.6 % 6.6 High    Average Blood Glucose (Calc) mg/dL 143   Resulting Agency  [facility] - [facility]  Narrative Performed by [facility] - [facility] Normal Range:    4.2 - 5.6%  Increased Risk:  5.7 - 6.4%  Diabetes:        >= 6.5%  Glycemic Control for adults with diabetes:  <7%    Specimen Collected: 03/30/22 08:11   Performed by: [facility] Last Resulted: 03/30/22 09:37  Received From: [facility]  Result Received: 03/31/22 07:44   Thyroid Stimulating Hormone (TSH) 0.450-5.330 uIU/ml uIU/mL 2.179   Resulting Agency  [facility]   Specimen Collected: 03/30/22 08:11   Performed by: [facility] Last Resulted: 03/30/22 10:11  Received From: [facility]  Result Received: 03/31/22 07:44  I have reviewed the labs.    Pertinent Imaging: ***  Assessment & Plan:    1. BPH with LUTS -***  2. Urgency/frequency -***  No follow-ups on  file.   , PA-C   [facility] [address], [address] [place], [place] [address] [phone] 

## 2022-04-14 NOTE — Telephone Encounter (Signed)
Pt contacted via telephone regarding missed appointment. Pt reports he had a conflicting appointment this AM and could not make it. He reports he would be at next appointment on Thursday 04/16/22 at [place] [facility], DPT

## 2022-04-15 ENCOUNTER — Ambulatory Visit: Payer: Medicare Other | Admitting: Urology

## 2022-04-15 ENCOUNTER — Other Ambulatory Visit: Payer: Self-pay | Admitting: Physician Assistant

## 2022-04-15 DIAGNOSIS — N3941 Urge incontinence: Secondary | ICD-10-CM

## 2022-04-15 DIAGNOSIS — R29818 Other symptoms and signs involving the nervous system: Secondary | ICD-10-CM

## 2022-04-15 DIAGNOSIS — N138 Other obstructive and reflux uropathy: Secondary | ICD-10-CM

## 2022-04-15 DIAGNOSIS — N529 Male erectile dysfunction, unspecified: Secondary | ICD-10-CM

## 2022-04-16 ENCOUNTER — Ambulatory Visit: Payer: Medicare Other

## 2022-04-16 DIAGNOSIS — R296 Repeated falls: Secondary | ICD-10-CM | POA: Diagnosis present

## 2022-04-16 DIAGNOSIS — R2681 Unsteadiness on feet: Secondary | ICD-10-CM | POA: Diagnosis present

## 2022-04-16 DIAGNOSIS — R262 Difficulty in walking, not elsewhere classified: Secondary | ICD-10-CM | POA: Diagnosis present

## 2022-04-16 DIAGNOSIS — R269 Unspecified abnormalities of gait and mobility: Secondary | ICD-10-CM | POA: Diagnosis present

## 2022-04-16 NOTE — Therapy (Addendum)
[place] MAIN [facility] SERVICES [address] [place], [place], [address] Phone: [phone]   Fax:  [phone]  Physical Therapy Treatment  Patient Details  Name: Mathew Aguilar MRN: [id number] Date of Birth: 1951/02/04 Referring Provider (PT): Dr. Harrel Lemon (PCP) Purpose for visit: Rehabilitation   Encounter Date: 04/16/2022   PT End of Session - 04/16/22 1024     Visit Number 3    Number of Visits 16    Date for PT Re-Evaluation 05/20/22    Authorization Type UHC Medicare, Medicaid secondary    Authorization Time Period 03/25/22-06/17/22    Progress Note Due on Visit 10    PT Start Time 1020    PT Stop Time 1058    PT Time Calculation (min) 38 min    Equipment Utilized During Treatment Gait belt    Activity Tolerance Patient tolerated treatment well;No increased pain    Behavior During Therapy WFL for tasks assessed/performed             Past Medical History:  Diagnosis Date   Anxiety    Asthma    Borderline personality disorder ([place])    Depression    Dysrhythmia    Erectile dysfunction    Fatty liver    GERD (gastroesophageal reflux disease)    Hypertension    Hypothyroidism    Pulmonary nodule, right    Restless leg    Sleep apnea    Suicide attempt [facility])     Past Surgical History:  Procedure Laterality Date   AMPUTATION ARM     COLONOSCOPY     COLONOSCOPY WITH PROPOFOL N/A 08/22/2018   Procedure: COLONOSCOPY WITH PROPOFOL;  Surgeon: Lollie Sails, MD;  Location: [facility] ENDOSCOPY;  Service: Endoscopy;  Laterality: N/A;   Subjective: Pt reports doing fine since prior visit. He continues to practice using his SPC. He reports his wound on elbow to be healing as desired. He denies any falls since prior visit.  Pain: Pt denies any pain today.   There were no vitals filed for this visit.  Vitals: 128/30mHg 66bpm (standing), 125/829mg 62bpm (seated)     INTERVENTION: -STS from chair 1x10 -standing  marching 1x10 bilat c 3lb AW -LAQ 1x15 bilat 3AW   -STS from chair 1x10 -standing marching 1x10 bilat c 3lb AW -LAQ 1x15 bilat 3AW  -lateral side stepping in 3lb AW  2x2026filat, supervision level (carries SPC but does not use)  -retro step training 3lb AW bilat   -in// bars steps obstacle course with The Step, 6" box, 3" airex, and 2" rocker board 1x c rail, twice without rail (minGuard)   -Greenlight/Redlight in hallway; cues for big steps upon initiation to overcome festination        PT Short Term Goals - 03/25/22 1541       PT SHORT TERM GOAL #1   Title Pt to reports decrease of falls at home <1x/month.    Baseline eval: 2-3/week since onset    Time 4    Period Weeks    Status New    Target Date 04/22/22      PT SHORT TERM GOAL #2   Title Pt to report successful use of SPC for resumption of AMB activity, rpeorts no longer limitng activity due to fear of falling.    Baseline eval: avoiding much wlaking due ot fear of falling    Time 4    Period Weeks    Status New    Target Date  04/22/22               PT Long Term Goals - 03/25/22 1543       PT LONG TERM GOAL #1   Title Pt to show improved FOTO score > 15 points to indicated improved feelings of confidence in balance    Baseline eval: 49    Time 8    Period Weeks    Status New    Target Date 05/20/22      PT LONG TERM GOAL #2   Title Pt to demonstrate 6MWT >1422f c LRAD without any LOB at modI level to indicate ability to safely perform limited community AMB.    Baseline eval: learning SPC use    Time 8    Period Weeks    Status New    Target Date 05/20/22      PT LONG TERM GOAL #3   Title Pt to show BBT score increase > 10 to indicated reduced risk of falls.    Baseline defered to visit 2    Time 8    Period Weeks    Status New    Target Date 05/20/22               Plan - 04/16/22 1035     Clinical Impression Statement Still working on basic strengthening of core and legs, as  well as step training. Pt still demonstrating festination upon commencement of gait. Pt using SPC more consistently at this point.    Examination-Activity Limitations Locomotion Level;Carry    Examination-Participation Restrictions Community Activity;Cleaning    Stability/Clinical Decision Making Stable/Uncomplicated    Clinical Decision Making High    Rehab Potential Good    PT Frequency 2x / week    PT Duration 8 weeks    PT Treatment/Interventions DME Instruction;Gait training;Stair training;Functional mobility training;Therapeutic activities;Therapeutic exercise;Balance training;Neuromuscular re-education;Patient/family education    PT Next Visit [facility]    PT Home Exercise Plan [facility] and training too!, focus on big steppin' with starting walk    Consulted and Agree with Plan of Care Patient                    Patient will benefit from skilled therapeutic intervention in order to improve the following deficits and impairments:     Visit Diagnosis: Difficulty in walking, not elsewhere classified  Repeated falls     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[place] 05/30/2020   Anxiety 05/30/2020   Erectile dysfunction 05/30/2020   History of avoidant personality disorder 05/30/2020   History of dysthymic disorder 05/30/2020   History of suicide attempt 05/30/2020   Hypothyroidism, acquired 05/30/2020   Diet-controlled type 2 diabetes mellitus ([place] 11/30/2016   Chronic insomnia 08/25/2016   COPD (chronic obstructive pulmonary disease) ([place] 04/03/2016   Alcohol use disorder, mild, abuse 02/27/2016   Severe recurrent major depression with psychotic features ([place] 02/27/2016   Suicidal ideation 02/03/2016   Body mass index (BMI) of 32.0-32.9 in adult 01/23/2016   Fatty liver 10/24/2015   Benign prostatic hyperplasia with lower urinary tract symptoms 04/08/2015   GERD (gastroesophageal reflux  disease) 02/19/2015   Essential hypertension 02/18/2015   Restless legs syndrome 02/18/2015   10:29 AM, 04/16/22 AEtta Grandchild PT, DPT Physical Therapist - [facility]- [phone]    Lillybeth Tal C, PT 04/16/2022, 10:29 AM  [place] MAIN [facility] SERVICES [address] [place], [place], [address] Phone: [phone]   Fax:  [phone]  Name: Mathew Aguilar MRN: [id number] Date of Birth: 1950-11-06

## 2022-04-17 ENCOUNTER — Encounter: Payer: Self-pay | Admitting: Urology

## 2022-04-18 ENCOUNTER — Ambulatory Visit
Admission: RE | Admit: 2022-04-18 | Discharge: 2022-04-18 | Disposition: A | Discharge status: Home / Self Care | Payer: Medicare Other | Source: Ambulatory Visit | Attending: Physician Assistant | Admitting: Physician Assistant

## 2022-04-18 DIAGNOSIS — R29818 Other symptoms and signs involving the nervous system: Secondary | ICD-10-CM | POA: Diagnosis not present

## 2022-04-20 ENCOUNTER — Ambulatory Visit: Payer: Medicare Other

## 2022-04-20 DIAGNOSIS — R269 Unspecified abnormalities of gait and mobility: Secondary | ICD-10-CM

## 2022-04-20 DIAGNOSIS — R296 Repeated falls: Secondary | ICD-10-CM

## 2022-04-20 DIAGNOSIS — R262 Difficulty in walking, not elsewhere classified: Secondary | ICD-10-CM

## 2022-04-20 DIAGNOSIS — R2681 Unsteadiness on feet: Secondary | ICD-10-CM

## 2022-04-20 NOTE — Therapy (Signed)
[place] MAIN [facility] SERVICES [address] [place], [place], [address] Phone: [phone]   Fax:  [phone]  Physical Therapy Treatment  Patient Details  Name: Mathew Aguilar MRN: [id number] Date of Birth: [birth year] Referring Provider (PT): Dr. Harrel Lemon (PCP)   Encounter Date: 04/20/2022   PT End of Session - 04/20/22 0933     Visit Number 4    Number of Visits 16    Date for PT Re-Evaluation 05/20/22    Authorization Type UHC Medicare, Medicaid secondary    Authorization Time Period 03/25/22-06/17/22    Progress Note Due on Visit 10    PT Start Time 0935    PT Stop Time 1014    PT Time Calculation (min) 39 min    Equipment Utilized During Treatment Gait belt    Activity Tolerance Patient tolerated treatment well;No increased pain    Behavior During Therapy WFL for tasks assessed/performed             Past Medical History:  Diagnosis Date   Anxiety    Asthma    Borderline personality disorder ([place])    Depression    Dysrhythmia    Erectile dysfunction    Fatty liver    GERD (gastroesophageal reflux disease)    Hypertension    Hypothyroidism    Pulmonary nodule, right    Restless leg    Sleep apnea    Suicide attempt [facility])     Past Surgical History:  Procedure Laterality Date   AMPUTATION ARM     COLONOSCOPY     COLONOSCOPY WITH PROPOFOL N/A 08/22/2018   Procedure: COLONOSCOPY WITH PROPOFOL;  Surgeon: Lollie Sails, MD;  Location: [facility] ENDOSCOPY;  Service: Endoscopy;  Laterality: N/A;    There were no vitals filed for this visit.  INTERVENTION:  THEREX:  -STS from chair 1x12 reps -Seated marching 1x10 bilat with 3lb AW -LAQ 2 x15 reps bilat 3lb AW -Seated HS curl with BTB- x 15 reps   Neuromuscular re-education:  -lateral side stepping up onto green therapad- x 12 reps -lateral side stepping up/over green therapad - Increased difficulty x 6 reps- switched to orange hurdle (CGA)  -lateral side  step up/over orange hurdle x 12 reps - Tandem standing- Attempt to hold 10-15 sec x 5 sets.  - Single leg stance - Patient only able to hold approx 1-2 sec x multiple attempts  -retro ambulation x 30 feet x 2 trials - No LOB with good cadence using SPC- CGA - Amb in hallway - calling out items on sticky notes placed wall- About 50% ability to correctly scan the hallway/walls and call out sticky notes  - Amb in hallway approx 75 feet x 2 sets with VC for larger steps.   Education provided throughout session via VC/TC and demonstration to facilitate movement at target joints and correct muscle activation for all testing and exercises performed.         PT Short Term Goals - 03/25/22 1541       PT SHORT TERM GOAL #1   Title Pt to reports decrease of falls at home <1x/month.    Baseline eval: 2-3/week since onset    Time 4    Period Weeks    Status New    Target Date 04/22/22      PT SHORT TERM GOAL #2   Title Pt to report successful use of SPC for resumption of AMB activity, rpeorts no longer limitng activity due to fear  of falling.    Baseline eval: avoiding much wlaking due ot fear of falling    Time 4    Period Weeks    Status New    Target Date 04/22/22               PT Long Term Goals - 03/25/22 1543       PT LONG TERM GOAL #1   Title Pt to show improved FOTO score > 15 points to indicated improved feelings of confidence in balance    Baseline eval: 49    Time 8    Period Weeks    Status New    Target Date 05/20/22      PT LONG TERM GOAL #2   Title Pt to demonstrate 6MWT >1454f c LRAD without any LOB at modI level to indicate ability to safely perform limited community AMB.    Baseline eval: learning SPC use    Time 8    Period Weeks    Status New    Target Date 05/20/22      PT LONG TERM GOAL #3   Title Pt to show BBT score increase > 10 to indicated reduced risk of falls.    Baseline defered to visit 2    Time 8    Period Weeks    Status New     Target Date 05/20/22               Plan - 04/16/22 1035     Clinical Impression Statement Patient presents with ongoing balance impairments including some festinating gait - improved with tandem and SLS activities. Patient with good participation with all activities and issued SLS and tandem standing for HEP which were both challenging for patient. Patient will benefit from further balance training to improve his mobility and decrease risk of falling.    Examination-Activity Limitations Locomotion Level;Carry    Examination-Participation Restrictions Community Activity;Cleaning    Stability/Clinical Decision Making Stable/Uncomplicated    Clinical Decision Making High    Rehab Potential Good    PT Frequency 2x / week    PT Duration 8 weeks    PT Treatment/Interventions DME Instruction;Gait training;Stair training;Functional mobility training;Therapeutic activities;Therapeutic exercise;Balance training;Neuromuscular re-education;Patient/family education    PT Next Visit Plan [facility]raining and continue balance training.   PT Home Exercise Plan [facility] LLCgettin' and training too!, focus on big steppin' with starting walk    Consulted and Agree with Plan of Care Patient                    Patient will benefit from skilled therapeutic intervention in order to improve the following deficits and impairments:     Visit Diagnosis: Difficulty in walking, not elsewhere classified  Repeated falls  Abnormality of gait and mobility  Unsteadiness on feet     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[place] 05/30/2020   Anxiety 05/30/2020   Erectile dysfunction 05/30/2020   History of avoidant personality disorder 05/30/2020   History of dysthymic disorder 05/30/2020   History of suicide attempt 05/30/2020   Hypothyroidism, acquired 05/30/2020   Diet-controlled type 2 diabetes mellitus ([place] 11/30/2016   Chronic insomnia  08/25/2016   COPD (chronic obstructive pulmonary disease) ([place] 04/03/2016   Alcohol use disorder, mild, abuse 02/27/2016   Severe recurrent major depression with psychotic features ([place] 02/27/2016   Suicidal ideation 02/03/2016   Body mass index (BMI) of 32.0-32.9 in adult  01/23/2016   Fatty liver 10/24/2015   Benign prostatic hyperplasia with lower urinary tract symptoms 04/08/2015   GERD (gastroesophageal reflux disease) 02/19/2015   Essential hypertension 02/18/2015   Restless legs syndrome 02/18/2015     Lewis Moccasin, PT 04/20/2022, 12:45 PM Physical Therapist - [facility]  Outpatient Physical Therapy- [place] MAIN [facility] SERVICES [address] [place], [place], [address] Phone: [phone]   Fax:  [phone]  Name: TOLLIE CANADA MRN: [id number] Date of Birth: 1951/08/20

## 2022-04-21 ENCOUNTER — Ambulatory Visit: Payer: Medicare Other

## 2022-04-23 ENCOUNTER — Ambulatory Visit: Payer: Medicare Other

## 2022-04-28 ENCOUNTER — Ambulatory Visit: Payer: Medicare Other | Admitting: Physical Therapy

## 2022-04-28 ENCOUNTER — Encounter: Payer: Self-pay | Admitting: Physical Therapy

## 2022-04-28 DIAGNOSIS — R269 Unspecified abnormalities of gait and mobility: Secondary | ICD-10-CM

## 2022-04-28 DIAGNOSIS — R262 Difficulty in walking, not elsewhere classified: Secondary | ICD-10-CM | POA: Diagnosis not present

## 2022-04-28 DIAGNOSIS — R296 Repeated falls: Secondary | ICD-10-CM

## 2022-04-28 DIAGNOSIS — R2681 Unsteadiness on feet: Secondary | ICD-10-CM

## 2022-04-28 NOTE — Therapy (Signed)
[place] MAIN [facility] SERVICES [address] [place], [place], [address] Phone: [phone]   Fax:  [phone]  Physical Therapy Treatment  Patient Details  Name: Mathew Aguilar MRN: [id number] Date of Birth: Nov 15, 1950 Referring Provider (PT): Dr. Harrel Lemon (PCP)   Encounter Date: 04/28/2022   PT End of Session - 04/28/22 0815     Visit Number 5    Number of Visits 16    Date for PT Re-Evaluation 05/20/22    Authorization Type UHC Medicare, Medicaid secondary    Authorization Time Period 03/25/22-06/17/22    Progress Note Due on Visit 10    PT Start Time 0820    PT Stop Time 0900    PT Time Calculation (min) 40 min    Equipment Utilized During Treatment Gait belt    Activity Tolerance Patient tolerated treatment well;No increased pain    Behavior During Therapy WFL for tasks assessed/performed             Past Medical History:  Diagnosis Date   Anxiety    Asthma    Borderline personality disorder ([place])    Depression    Dysrhythmia    Erectile dysfunction    Fatty liver    GERD (gastroesophageal reflux disease)    Hypertension    Hypothyroidism    Pulmonary nodule, right    Restless leg    Sleep apnea    Suicide attempt [facility])     Past Surgical History:  Procedure Laterality Date   AMPUTATION ARM     COLONOSCOPY     COLONOSCOPY WITH PROPOFOL N/A 08/22/2018   Procedure: COLONOSCOPY WITH PROPOFOL;  Surgeon: Lollie Sails, MD;  Location: [facility] ENDOSCOPY;  Service: Endoscopy;  Laterality: N/A;    Subjective Assessment -    Subjective Subjective: pt reports he has had no LOB since last visit. Pt reports having brain scan and everything looking normal. Pt reports no pain at this time.    Pertinent History Mathew Aguilar is a 58yoM who [facility] O[facility] neuro referred by PCP after acute insidious onset difficulty with walking that began roughly 4 weeks ago upon waking that morning. Pt reports his issue to solely be  related to difficulty with inititation of gait. Pt reports several falls assicated with this new problem. Pt denies any history of prior gait difficult or falls, denies any current or previous changes in strength, sensorium, no history or associated diziness, no LOC, no presyncope. Pt reports only mild improvement since onset. Pt was seen by PCP in office for this issue on 02/24/22 and referred to PT for referral. No imaging has been done, no referral to other provides.             There were no vitals filed for this visit.    INTERVENTION:  THEREX:  -STS from chair then ambulating 10 feet and turning then returning to sit in chair. X 8 reps with focus on good initiation of large steps.   -standing  marching 2 x10 bilat with 3lb AW Standing hamstring curls with 3# Aw 2 x 10 reps  -LAQ 3 x10 reps bilat 3lb AW, cues for eccentric control  -Seated HS curl with BTB- 2 x 10 reps   Neuromuscular re-education:   -lateral side stepping up/over orange hurdle with UE assist, multiple errors.  -lateral side step without UE assist x 15 reps each direction, cues for proper form afor proper muscle recruitment, no LOB,   Airex step on  and off without upper extremity assist x10, 1 loss of balance due to right lower extremity catching on Airex pad but with cues and following this area patient did not have any loss of balance or further errors.  - ambulaiton with obstacles to encourage stopping and starting walking had half foam roller to step over, Airex pad to step on, and count at end with approximately 10 feet between each object with focus on starting and stopping gait pattern without shuffling style of gait, good response overall to this treatment.  -Ambulation with red light, yellow light, green light, and retroambulation hip PT determined intervals.  Patient demonstrated good response without any signs of shuffling pattern of gait.  Patient also chose not to use proper cane utilization just held  cane in his hand but did not demonstrate any loss of balance with this strategy.  Education provided throughout session via VC/TC and demonstration to facilitate movement at target joints and correct muscle activation for all testing and exercises performed.    Rationale for Evaluation and Treatment Rehabilitation       PT Short Term Goals - 03/25/22 1541       PT SHORT TERM GOAL #1   Title Pt to reports decrease of falls at home <1x/month.    Baseline eval: 2-3/week since onset    Time 4    Period Weeks    Status New    Target Date 04/22/22      PT SHORT TERM GOAL #2   Title Pt to report successful use of SPC for resumption of AMB activity, rpeorts no longer limitng activity due to fear of falling.    Baseline eval: avoiding much wlaking due ot fear of falling    Time 4    Period Weeks    Status New    Target Date 04/22/22               PT Long Term Goals - 03/25/22 1543       PT LONG TERM GOAL #1   Title Pt to show improved FOTO score > 15 points to indicated improved feelings of confidence in balance    Baseline eval: 49    Time 8    Period Weeks    Status New    Target Date 05/20/22      PT LONG TERM GOAL #2   Title Pt to demonstrate 6MWT >1496f c LRAD without any LOB at modI level to indicate ability to safely perform limited community AMB.    Baseline eval: learning SPC use    Time 8    Period Weeks    Status New    Target Date 05/20/22      PT LONG TERM GOAL #3   Title Pt to show BBT score increase > 10 to indicated reduced risk of falls.    Baseline defered to visit 2    Time 8    Period Weeks    Status New    Target Date 05/20/22               Plan - 04/16/22 1035     Clinical Impression Statement Patient presents with ongoing balance impairments but did present with good motivation for completion of activities.  Patient responded well to interventions designed at starting and stopping gait pattern without shuffling style of gait.   Patient does have instability with dynamic balance tasks including marching without upper extremity assist as well as with stepping on and off Airex pad without  support.  Patient will continue to benefit from skilled physical therapy to improve his gait, decrease fall risk, improve his strength, improve his quality of life.   Examination-Activity Limitations Locomotion Level;Carry    Examination-Participation Restrictions Community Activity;Cleaning    Stability/Clinical Decision Making Stable/Uncomplicated    Clinical Decision Making High    Rehab Potential Good    PT Frequency 2x / week    PT Duration 8 weeks    PT Treatment/Interventions DME Instruction;Gait training;Stair training;Functional mobility training;Therapeutic activities;Therapeutic exercise;Balance training;Neuromuscular re-education;Patient/family education    PT Next Visit Plan Review [facility] training and continue balance training.   PT Home Exercise Plan [facility] gettin' and training too!, focus on big steppin' with starting walk    Consulted and Agree with Plan of Care Patient                    Patient will benefit from skilled therapeutic intervention in order to improve the following deficits and impairments:     Visit Diagnosis: Difficulty in walking, not elsewhere classified  Repeated falls  Abnormality of gait and mobility  Unsteadiness on feet     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[place]) 05/30/2020   Anxiety 05/30/2020   Erectile dysfunction 05/30/2020   History of avoidant personality disorder 05/30/2020   History of dysthymic disorder 05/30/2020   History of suicide attempt 05/30/2020   Hypothyroidism, acquired 05/30/2020   Diet-controlled type 2 diabetes mellitus ([place]) 11/30/2016   Chronic insomnia 08/25/2016   COPD (chronic obstructive pulmonary disease) ([place]) 04/03/2016   Alcohol use disorder, mild, abuse 02/27/2016   Severe recurrent  major depression with psychotic features ([place]) 02/27/2016   Suicidal ideation 02/03/2016   Body mass index (BMI) of 32.0-32.9 in adult 01/23/2016   Fatty liver 10/24/2015   Benign prostatic hyperplasia with lower urinary tract symptoms 04/08/2015   GERD (gastroesophageal reflux disease) 02/19/2015   Essential hypertension 02/18/2015   Restless legs syndrome 02/18/2015     Particia Lather, PT 04/28/2022, 8:41 AM Physical Therapist - [facility]  Outpatient Physical Therapy- [place] MAIN [facility] SERVICES [address] [place], [place], [address] Phone: [phone]   Fax:  [phone]  Name: Mathew Aguilar MRN: [id number] Date of Birth: 12-02-1950

## 2022-04-29 ENCOUNTER — Ambulatory Visit: Payer: Medicare Other | Admitting: Physical Therapy

## 2022-04-30 ENCOUNTER — Ambulatory Visit: Payer: Medicare Other | Admitting: Physical Therapy

## 2022-04-30 ENCOUNTER — Telehealth: Payer: Self-pay | Admitting: Physical Therapy

## 2022-04-30 NOTE — Telephone Encounter (Signed)
Pt contacted regarding missed appointment. Pt explains he had a flat tire and that caused him to miss appointment. Pt informed of next appointment time and states he will be able to make it to next appointment.  Rivka Barbara PT, DPT

## 2022-05-05 ENCOUNTER — Ambulatory Visit: Payer: Medicare Other

## 2022-05-05 DIAGNOSIS — R262 Difficulty in walking, not elsewhere classified: Secondary | ICD-10-CM

## 2022-05-05 DIAGNOSIS — R2681 Unsteadiness on feet: Secondary | ICD-10-CM

## 2022-05-05 DIAGNOSIS — R269 Unspecified abnormalities of gait and mobility: Secondary | ICD-10-CM

## 2022-05-05 DIAGNOSIS — R296 Repeated falls: Secondary | ICD-10-CM

## 2022-05-05 NOTE — Therapy (Signed)
[place] MAIN [facility] SERVICES [address] [place], [place], [address] Phone: [phone]   Fax:  [phone]  Physical Therapy Treatment  Patient Details  Name: Mathew Aguilar MRN: [id number] Date of Birth: 1951/02/07 Referring Provider (PT): Dr. Harrel Lemon (PCP)   Encounter Date: 05/05/2022   PT End of Session - 05/05/22 1022     Visit Number 6    Number of Visits 16    Date for PT Re-Evaluation 05/20/22    Authorization Type UHC Medicare, Medicaid secondary    Authorization Time Period 03/25/22-06/17/22    Progress Note Due on Visit 10    PT Start Time 1018    PT Stop Time 1101    PT Time Calculation (min) 43 min    Equipment Utilized During Treatment Gait belt    Activity Tolerance Patient tolerated treatment well;No increased pain    Behavior During Therapy WFL for tasks assessed/performed             Past Medical History:  Diagnosis Date   Anxiety    Asthma    Borderline personality disorder ([place])    Depression    Dysrhythmia    Erectile dysfunction    Fatty liver    GERD (gastroesophageal reflux disease)    Hypertension    Hypothyroidism    Pulmonary nodule, right    Restless leg    Sleep apnea    Suicide attempt [facility])     Past Surgical History:  Procedure Laterality Date   AMPUTATION ARM     COLONOSCOPY     COLONOSCOPY WITH PROPOFOL N/A 08/22/2018   Procedure: COLONOSCOPY WITH PROPOFOL;  Surgeon: Lollie Sails, MD;  Location: [facility] ENDOSCOPY;  Service: Endoscopy;  Laterality: N/A;    Subjective Assessment -    Subjective Patient reports missing last visit due to having a flat tire. He denies any falls or pain- states still shuffling about but trying to be more mindful.    Pertinent History Mathew Aguilar is a 67yoM who comes to [facility] OPPT neuro referred by PCP after acute insidious onset difficulty with walking that began roughly 4 weeks ago upon waking that morning. Pt reports his issue to solely be  related to difficulty with inititation of gait. Pt reports several falls assicated with this new problem. Pt denies any history of prior gait difficult or falls, denies any current or previous changes in strength, sensorium, no history or associated diziness, no LOC, no presyncope. Pt reports only mild improvement since onset. Pt was seen by PCP in office for this issue on 02/24/22 and referred to PT for referral. No imaging has been done, no referral to other provides.             There were no vitals filed for this visit.    INTERVENTION:  THEREX:  -STS from chair then ambulating 10 feet and turning then returning to sit in chair. X 8 reps with focus on good initiation of large steps.   -standing  marching 2 x10 bilat with 3lb AW Standing hamstring curls with 3# Aw 2 x 10 reps  -LAQ 2 x15 reps bilat 4lb AW, cues for eccentric control  Seated march with 4lb AW alt LE - 2 sets of 15 reps -Seated HS curl with BTB- 2 x 15 reps   Neuromuscular re-education:   -Step up onto 1st step (at stairs)  with opp LE high knee march- Attempted without UE support yet unable and performed 15 reps each  LE with left UE support -lateral side stepping up onto airex pad then off on opp side and back up and over back to start without UE support- mild difficulty with coordination.  -Airex step on and off without upper extremity assist x10, initial LOB 1st couple of steps- Right toe clipped edge of pad. -Staggered standing- (back foot on blue airex pad and front foot on green therapad/step) - hold x 15-20 sec x 3 each LE. More unsteadiness with left LE in rear.  - Forward/backward step up/over orange hurdle - patient required 1 UE Support and unable to perform without using his Left UE support today.     Education provided throughout session via VC/TC and demonstration to facilitate movement at target joints and correct muscle activation for all testing and exercises performed.    Rationale for  Evaluation and Treatment Rehabilitation       PT Short Term Goals - 03/25/22 1541       PT SHORT TERM GOAL #1   Title Pt to reports decrease of falls at home <1x/month.    Baseline eval: 2-3/week since onset    Time 4    Period Weeks    Status New    Target Date 04/22/22      PT SHORT TERM GOAL #2   Title Pt to report successful use of SPC for resumption of AMB activity, rpeorts no longer limitng activity due to fear of falling.    Baseline eval: avoiding much wlaking due ot fear of falling    Time 4    Period Weeks    Status New    Target Date 04/22/22               PT Long Term Goals - 03/25/22 1543       PT LONG TERM GOAL #1   Title Pt to show improved FOTO score > 15 points to indicated improved feelings of confidence in balance    Baseline eval: 49    Time 8    Period Weeks    Status New    Target Date 05/20/22      PT LONG TERM GOAL #2   Title Pt to demonstrate 6MWT >1438f c LRAD without any LOB at modI level to indicate ability to safely perform limited community AMB.    Baseline eval: learning SPC use    Time 8    Period Weeks    Status New    Target Date 05/20/22      PT LONG TERM GOAL #3   Title Pt to show BBT score increase > 10 to indicated reduced risk of falls.    Baseline defered to visit 2    Time 8    Period Weeks    Status New    Target Date 05/20/22               Plan - 04/16/22 1035     Clinical Impression Statement Patient continues to exhibit dynamic balance impairments- specifically any single limb stance. He was responsive to all cue with LE strengthening for eccentric slow control.  Patient will continue to benefit from skilled physical therapy to improve his gait, decrease fall risk, improve his strength, improve his quality of life.   Examination-Activity Limitations Locomotion Level;Carry    Examination-Participation Restrictions Community Activity;Cleaning    Stability/Clinical Decision Making Stable/Uncomplicated     Clinical Decision Making High    Rehab Potential Good    PT Frequency 2x / week  PT Duration 8 weeks    PT Treatment/Interventions DME Instruction;Gait training;Stair training;Functional mobility training;Therapeutic activities;Therapeutic exercise;Balance training;Neuromuscular re-education;Patient/family education    PT Next Visit Plan Review [facility] training and continue balance training.   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[place]) 05/30/2020   Anxiety 05/30/2020   Erectile dysfunction 05/30/2020   History of avoidant personality disorder 05/30/2020   History of dysthymic disorder 05/30/2020   History of suicide attempt 05/30/2020   Hypothyroidism, acquired 05/30/2020   Diet-controlled type 2 diabetes mellitus ([place]) 11/30/2016   Chronic insomnia 08/25/2016   COPD (chronic obstructive pulmonary disease) ([place]) 04/03/2016   Alcohol use disorder, mild, abuse 02/27/2016   Severe recurrent major depression with psychotic features ([place]) 02/27/2016   Suicidal ideation 02/03/2016   Body mass index (BMI) of 32.0-32.9 in adult 01/23/2016   Fatty liver 10/24/2015   Benign prostatic hyperplasia with lower urinary tract symptoms 04/08/2015   GERD (gastroesophageal reflux disease) 02/19/2015   Essential hypertension 02/18/2015   Restless legs syndrome 02/18/2015     Lewis Moccasin, PT 05/05/2022, 11:24 AM Physical Therapist - [facility]  Outpatient  Physical Therapy- [place] MAIN [facility] SERVICES [address] [place], [place], [address] Phone: [phone]   Fax:  [phone]  Name: Mathew Aguilar MRN: [id number] Date of Birth: [birth year]

## 2022-05-07 ENCOUNTER — Ambulatory Visit: Payer: Medicare Other

## 2022-05-07 DIAGNOSIS — R262 Difficulty in walking, not elsewhere classified: Secondary | ICD-10-CM

## 2022-05-07 DIAGNOSIS — R2681 Unsteadiness on feet: Secondary | ICD-10-CM

## 2022-05-07 DIAGNOSIS — R269 Unspecified abnormalities of gait and mobility: Secondary | ICD-10-CM

## 2022-05-07 DIAGNOSIS — R296 Repeated falls: Secondary | ICD-10-CM

## 2022-05-07 NOTE — Therapy (Signed)
[place] MAIN [facility] SERVICES [address] [place], [place], [address] Phone: [phone]   Fax:  [phone]  Physical Therapy Treatment  Patient Details  Name: Mathew Aguilar MRN: [id number] Date of Birth: Nov 08, 1950 Referring Provider (PT): Dr. Harrel Lemon (PCP)   Encounter Date: 05/07/2022   PT End of Session - 05/07/22 0809     Visit Number 7    Number of Visits 16    Date for PT Re-Evaluation 05/20/22    Authorization Type UHC Medicare, Medicaid secondary    Authorization Time Period 03/25/22-06/17/22    Progress Note Due on Visit 10    PT Start Time 0802    PT Stop Time 0843    PT Time Calculation (min) 41 min    Equipment Utilized During Treatment Gait belt    Activity Tolerance Patient tolerated treatment well;No increased pain    Behavior During Therapy WFL for tasks assessed/performed             Past Medical History:  Diagnosis Date   Anxiety    Asthma    Borderline personality disorder ([place])    Depression    Dysrhythmia    Erectile dysfunction    Fatty liver    GERD (gastroesophageal reflux disease)    Hypertension    Hypothyroidism    Pulmonary nodule, right    Restless leg    Sleep apnea    Suicide attempt [facility])     Past Surgical History:  Procedure Laterality Date   AMPUTATION ARM     COLONOSCOPY     COLONOSCOPY WITH PROPOFOL N/A 08/22/2018   Procedure: COLONOSCOPY WITH PROPOFOL;  Surgeon: Lollie Sails, MD;  Location: [facility] ENDOSCOPY;  Service: Endoscopy;  Laterality: N/A;    Subjective Assessment -    Subjective Patient reports doing okay- Reports he always shuffles more in the morning.    Pertinent History [name] is a 61yoM who comes to [facility] OPPT neuro referred by PCP after acute insidious onset difficulty with walking that began roughly 4 weeks ago upon waking that morning. Pt reports his issue to solely be related to difficulty with inititation of gait. Pt reports several falls  assicated with this new problem. Pt denies any history of prior gait difficult or falls, denies any current or previous changes in strength, sensorium, no history or associated diziness, no LOC, no presyncope. Pt reports only mild improvement since onset. Pt was seen by PCP in office for this issue on 02/24/22 and referred to PT for referral. No imaging has been done, no referral to other provides.             There were no vitals filed for this visit.    INTERVENTION:  THEREX:  -STS from chair then ambulating 10 feet using cane and turning then returning to sit in chair. X 8 reps with focus on good initiation of large steps. (Time initially 19 sec to complete. Each round improved and last 2 rounds = 14. 3 sec. -seated marching onto 6 " block x 15 reps alt LE with 3lb AW Standing hamstring curls with 3# Aw 2 x 10 reps  -LAQ 2 x15 reps Alt LE with 3lb AW, cues for eccentric control  -Seated HS curl with BTB- x 15 reps   Neuromuscular re-education:  -Tandem standing at support bar (Left hand close/hover but not touch) x mult attempts - at best with right LE in back- able to hold for 30 sec but very inconsistent  times- ranging from 4-30 sec each direction)  -SLS Attempting without UE support (inconsistent times - ranging from 2 sec to max of 5 sec) each Leg with more difficulty standing on left LE. -Forward/backward walking x 10 feet with 3lb AW. CGA -Staggered standing- on airex - able to hold 30 sec with mostly ankle righting reaction -Dynamic marching on airex pad- VC to perform slowly x 20 sec with intermittent 1 UE reaching for support.  -Gait with SPC- 200 feet with gait speed measured at 0.22 m/s  - Forward/backward step up/over orange hurdle - patient required 1 UE Support     Education provided throughout session via VC/TC and demonstration to facilitate movement at target joints and correct muscle activation for all testing and exercises performed.    Rationale for  Evaluation and Treatment Rehabilitation       PT Short Term Goals - 03/25/22 1541       PT SHORT TERM GOAL #1   Title Pt to reports decrease of falls at home <1x/month.    Baseline eval: 2-3/week since onset    Time 4    Period Weeks    Status New    Target Date 04/22/22      PT SHORT TERM GOAL #2   Title Pt to report successful use of SPC for resumption of AMB activity, rpeorts no longer limitng activity due to fear of falling.    Baseline eval: avoiding much wlaking due ot fear of falling    Time 4    Period Weeks    Status New    Target Date 04/22/22               PT Long Term Goals - 03/25/22 1543       PT LONG TERM GOAL #1   Title Pt to show improved FOTO score > 15 points to indicated improved feelings of confidence in balance    Baseline eval: 49    Time 8    Period Weeks    Status New    Target Date 05/20/22      PT LONG TERM GOAL #2   Title Pt to demonstrate 6MWT >1444f c LRAD without any LOB at modI level to indicate ability to safely perform limited community AMB.    Baseline eval: learning SPC use    Time 8    Period Weeks    Status New    Target Date 05/20/22      PT LONG TERM GOAL #3   Title Pt to show BBT score increase > 10 to indicated reduced risk of falls.    Baseline defered to visit 2    Time 8    Period Weeks    Status New    Target Date 05/20/22               Plan - 04/16/22 1035     Clinical Impression Statement Patient challenged with balance activities- exhibiting difficulty with tandem and later SLS. He did demonstrate improved ability with practice today. He started off more shuffling but at end of visit- able to take longer step abiet slow cadence.  Patient will continue to benefit from skilled physical therapy to improve his gait, decrease fall risk, improve his strength, improve his quality of life.   Examination-Activity Limitations Locomotion Level;Carry    Examination-Participation Restrictions Community  Activity;Cleaning    Stability/Clinical Decision Making Stable/Uncomplicated    Clinical Decision Making High    Rehab Potential Good    PT  Frequency 2x / week    PT Duration 8 weeks    PT Treatment/Interventions DME Instruction;Gait training;Stair training;Functional mobility training;Therapeutic activities;Therapeutic exercise;Balance training;Neuromuscular re-education;Patient/family education    PT Next Visit Plan Review [facility] training and continue balance training.   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[place]) 05/30/2020   Anxiety 05/30/2020   Erectile dysfunction 05/30/2020   History of avoidant personality disorder 05/30/2020   History of dysthymic disorder 05/30/2020   History of suicide attempt 05/30/2020   Hypothyroidism, acquired 05/30/2020   Diet-controlled type 2 diabetes mellitus ([place]) 11/30/2016   Chronic insomnia 08/25/2016   COPD (chronic obstructive pulmonary disease) ([place]) 04/03/2016   Alcohol use disorder, mild, abuse 02/27/2016   Severe recurrent major depression with psychotic features ([place]) 02/27/2016   Suicidal ideation 02/03/2016   Body mass index (BMI) of 32.0-32.9 in adult 01/23/2016   Fatty liver 10/24/2015   Benign prostatic hyperplasia with lower urinary tract symptoms 04/08/2015   GERD (gastroesophageal reflux disease) 02/19/2015   Essential hypertension 02/18/2015   Restless legs syndrome 02/18/2015     Lewis Moccasin, PT 05/07/2022, 10:39  AM Physical Therapist - [facility]  Outpatient Physical Therapy- [place] MAIN [facility] SERVICES [address] [place], [place], [address] Phone: [phone]   Fax:  [phone]  Name: JONTAE ADEBAYO MRN: [id number] Date of Birth: 12-Jun-1951

## 2022-05-12 ENCOUNTER — Ambulatory Visit: Payer: Medicare Other

## 2022-05-12 DIAGNOSIS — R262 Difficulty in walking, not elsewhere classified: Secondary | ICD-10-CM

## 2022-05-12 DIAGNOSIS — R2681 Unsteadiness on feet: Secondary | ICD-10-CM

## 2022-05-12 DIAGNOSIS — R269 Unspecified abnormalities of gait and mobility: Secondary | ICD-10-CM

## 2022-05-12 DIAGNOSIS — R296 Repeated falls: Secondary | ICD-10-CM

## 2022-05-12 NOTE — Therapy (Signed)
[place] MAIN [facility] SERVICES [address] [place], [place], [address] Phone: [phone]   Fax:  [phone]  Physical Therapy Treatment  Patient Details  Name: Mathew Aguilar MRN: [id number] Date of Birth: 03/15/1951 Referring Provider (PT): Dr. Harrel Lemon (PCP)   Encounter Date: 05/12/2022   PT End of Session - 05/12/22 0757     Visit Number 8    Number of Visits 16    Date for PT Re-Evaluation 05/20/22    Authorization Type UHC Medicare, Medicaid secondary    Authorization Time Period 03/25/22-06/17/22    Progress Note Due on Visit 10    PT Start Time 0800    PT Stop Time 0843    PT Time Calculation (min) 43 min    Equipment Utilized During Treatment Gait belt    Activity Tolerance Patient tolerated treatment well;No increased pain    Behavior During Therapy WFL for tasks assessed/performed             Past Medical History:  Diagnosis Date   Anxiety    Asthma    Borderline personality disorder ([place])    Depression    Dysrhythmia    Erectile dysfunction    Fatty liver    GERD (gastroesophageal reflux disease)    Hypertension    Hypothyroidism    Pulmonary nodule, right    Restless leg    Sleep apnea    Suicide attempt [facility])     Past Surgical History:  Procedure Laterality Date   AMPUTATION ARM     COLONOSCOPY     COLONOSCOPY WITH PROPOFOL N/A 08/22/2018   Procedure: COLONOSCOPY WITH PROPOFOL;  Surgeon: Lollie Sails, MD;  Location: [facility] ENDOSCOPY;  Service: Endoscopy;  Laterality: N/A;    Subjective Assessment -    Subjective Pt endorses one fall since last session with walking, Unable to really specify what happened. No serious injury. Able to get up from fall. Remains shuffling worst in the morning.    Pertinent History Mathew Aguilar is a 63yoM who comes to [facility] OPPT neuro referred by PCP after acute insidious onset difficulty with walking that began roughly 4 weeks ago upon waking that morning. Pt  reports his issue to solely be related to difficulty with inititation of gait. Pt reports several falls assicated with this new problem. Pt denies any history of prior gait difficult or falls, denies any current or previous changes in strength, sensorium, no history or associated diziness, no LOC, no presyncope. Pt reports only mild improvement since onset. Pt was seen by PCP in office for this issue on 02/24/22 and referred to PT for referral. No imaging has been done, no referral to other provides.             There were no vitals filed for this visit.    INTERVENTION: 05/12/22  NEURO RE-ED  STS from chair amb 10' with SPC returning to chair. X10 reps with focus on large steps. CGA. Mild shuffling/difficulty with turning around with poor foot clearance.   Figure 8's with SPC to practice turns: x10 reps with PT demo prior to completion. Additional 10 reps with 2# AW's with focus on foot clearance with turns.   Side step marches with 2# AW's for improving foot clearance for gait: 20' distance, x8 reps CGA. Difficulty marching leading to mainly side steps. VC's for decreasing speed and improving foot clearance.  Forward steps 20' total alternating steps over orange hurdle and 1/2 bolster: x10 reps with VC's  for large foot clearances. Pt did excellent alternating each LE over each obstacle throughout.   Gait: 28x160' with 2# AW's working on consistent cadence and step through gait with adequate foot clearance. CGA.  Overall great consistent step through gait. Towards end of each bout pt tends to scuff feet requiring min VC's with good carryover after cues.    There.ex:  STS from standard height chair with VC's to keep feet in place throughout: 2x10   Standing at balance bar: 2x10/LE with LUE support on balance bar. SBA  Marches  Toe raises for anterior tib strengthening      Education provided throughout session via VC/TC and demonstration to facilitate movement at target joints and  correct muscle activation for all testing and exercises performed.    Rationale for Evaluation and Treatment Rehabilitation       PT Short Term Goals - 03/25/22 1541       PT SHORT TERM GOAL #1   Title Pt to reports decrease of falls at home <1x/month.    Baseline eval: 2-3/week since onset    Time 4    Period Weeks    Status New    Target Date 04/22/22      PT SHORT TERM GOAL #2   Title Pt to report successful use of SPC for resumption of AMB activity, rpeorts no longer limitng activity due to fear of falling.    Baseline eval: avoiding much wlaking due ot fear of falling    Time 4    Period Weeks    Status New    Target Date 04/22/22               PT Long Term Goals - 03/25/22 1543       PT LONG TERM GOAL #1   Title Pt to show improved FOTO score > 15 points to indicated improved feelings of confidence in balance    Baseline eval: 49    Time 8    Period Weeks    Status New    Target Date 05/20/22      PT LONG TERM GOAL #2   Title Pt to demonstrate 6MWT >1436f c LRAD without any LOB at modI level to indicate ability to safely perform limited community AMB.    Baseline eval: learning SPC use    Time 8    Period Weeks    Status New    Target Date 05/20/22      PT LONG TERM GOAL #3   Title Pt to show BBT score increase > 10 to indicated reduced risk of falls.    Baseline defered to visit 2    Time 8    Period Weeks    Status New    Target Date 05/20/22               Plan - 04/16/22 1035     Clinical Impression Statement Continuing PT POC with focus on foot clearance exercises. Use of AW's as proprioceptive cuing for step heights with success with reduction in scuffing of feet. Pt does demonstrate difficulty in foot clearance with turns with heavy focus on turning activities today with VC's for decreasing speed with turns and enlarging step heights to prevent tripping LOB. Pt improves throughout session remaining highly motivated. Patient will  continue to benefit from skilled physical therapy to improve his gait, decrease fall risk, improve his strength, improve his quality of life.    Examination-Activity Limitations LAdult nurse   Examination-Participation Restrictions Community  Activity;Cleaning    Stability/Clinical Decision Making Stable/Uncomplicated    Clinical Decision Making High    Rehab Potential Good    PT Frequency 2x / week    PT Duration 8 weeks    PT Treatment/Interventions DME Instruction;Gait training;Stair training;Functional mobility training;Therapeutic activities;Therapeutic exercise;Balance training;Neuromuscular re-education;Patient/family education    PT Next Visit Plan Review [facility] training and continue balance training.   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[place]) 05/30/2020   Anxiety 05/30/2020   Erectile dysfunction 05/30/2020   History of avoidant personality disorder 05/30/2020   History of dysthymic disorder 05/30/2020   History of suicide attempt 05/30/2020   Hypothyroidism, acquired 05/30/2020   Diet-controlled type 2 diabetes mellitus ([place]) 11/30/2016   Chronic insomnia 08/25/2016   COPD (chronic obstructive pulmonary disease) ([place]) 04/03/2016   Alcohol use disorder, mild, abuse 02/27/2016   Severe recurrent major depression with psychotic features ([place]) 02/27/2016   Suicidal ideation 02/03/2016   Body mass index (BMI) of 32.0-32.9 in adult 01/23/2016   Fatty liver 10/24/2015   Benign  prostatic hyperplasia with lower urinary tract symptoms 04/08/2015   GERD (gastroesophageal reflux disease) 02/19/2015   Essential hypertension 02/18/2015   Restless legs syndrome 02/18/2015     Salem Caster. Fairly IV, PT, DPT Physical Therapist- [facility]  05/12/2022, 8:46 AM

## 2022-05-13 NOTE — Therapy (Signed)
[place] MAIN [facility] SERVICES [address] [place], [place], [address] Phone: [phone]   Fax:  [phone]  Physical Therapy Treatment  Patient Details  Name: Mathew Aguilar MRN: [id number] Date of Birth: 05-Dec-1950 Referring Provider (PT): Dr. Harrel Lemon (PCP)   Encounter Date: 05/14/2022     Past Medical History:  Diagnosis Date   Anxiety    Asthma    Borderline personality disorder [facility])    Depression    Dysrhythmia    Erectile dysfunction    Fatty liver    GERD (gastroesophageal reflux disease)    Hypertension    Hypothyroidism    Pulmonary nodule, right    Restless leg    Sleep apnea    Suicide attempt [facility])     Past Surgical History:  Procedure Laterality Date   AMPUTATION ARM     COLONOSCOPY     COLONOSCOPY WITH PROPOFOL N/A 08/22/2018   Procedure: COLONOSCOPY WITH PROPOFOL;  Surgeon: Lollie Sails, MD;  Location: [facility] ENDOSCOPY;  Service: Endoscopy;  Laterality: N/A;    Subjective Assessment -    Subjective Pt endorses one fall since last session with walking, Unable to really specify what happened. No serious injury. Able to get up from fall. Remains shuffling worst in the morning.    Pertinent History Mathew Aguilar is a 68yoM who comes to [facility] OPPT neuro referred by PCP after acute insidious onset difficulty with walking that began roughly 4 weeks ago upon waking that morning. Pt reports his issue to solely be related to difficulty with inititation of gait. Pt reports several falls assicated with this new problem. Pt denies any history of prior gait difficult or falls, denies any current or previous changes in strength, sensorium, no history or associated diziness, no LOC, no presyncope. Pt reports only mild improvement since onset. Pt was seen by PCP in office for this issue on 02/24/22 and referred to PT for referral. No imaging has been done, no referral to other provides.             There were no  vitals filed for this visit.    INTERVENTION: 05/12/22  NEURO RE-ED  STS from chair amb 10' with SPC returning to chair. X10 reps with focus on large steps. CGA. Mild shuffling/difficulty with turning around with poor foot clearance.   Figure 8's with SPC to practice turns: x10 reps with PT demo prior to completion. Additional 10 reps with 2# AW's with focus on foot clearance with turns.   Side step marches with 2# AW's for improving foot clearance for gait: 20' distance, x8 reps CGA. Difficulty marching leading to mainly side steps. VC's for decreasing speed and improving foot clearance.  Forward steps 20' total alternating steps over orange hurdle and 1/2 bolster: x10 reps with VC's for large foot clearances. Pt did excellent alternating each LE over each obstacle throughout.   Gait: 84x160' with 2# AW's working on consistent cadence and step through gait with adequate foot clearance. CGA.  Overall great consistent step through gait. Towards end of each bout pt tends to scuff feet requiring min VC's with good carryover after cues.    There.ex:  STS from standard height chair with VC's to keep feet in place throughout: 2x10   Standing at balance bar: 2x10/LE with LUE support on balance bar. SBA  Marches  Toe raises for anterior tib strengthening      Education provided throughout session via VC/TC and demonstration to facilitate movement  at target joints and correct muscle activation for all testing and exercises performed.    Rationale for Evaluation and Treatment Rehabilitation       PT Short Term Goals - 03/25/22 1541       PT SHORT TERM GOAL #1   Title Pt to reports decrease of falls at home <1x/month.    Baseline eval: 2-3/week since onset    Time 4    Period Weeks    Status New    Target Date 04/22/22      PT SHORT TERM GOAL #2   Title Pt to report successful use of SPC for resumption of AMB activity, rpeorts no longer limitng activity due to fear of falling.     Baseline eval: avoiding much wlaking due ot fear of falling    Time 4    Period Weeks    Status New    Target Date 04/22/22               PT Long Term Goals - 03/25/22 1543       PT LONG TERM GOAL #1   Title Pt to show improved FOTO score > 15 points to indicated improved feelings of confidence in balance    Baseline eval: 49    Time 8    Period Weeks    Status New    Target Date 05/20/22      PT LONG TERM GOAL #2   Title Pt to demonstrate 6MWT >1463f c LRAD without any LOB at modI level to indicate ability to safely perform limited community AMB.    Baseline eval: learning SPC use    Time 8    Period Weeks    Status New    Target Date 05/20/22      PT LONG TERM GOAL #3   Title Pt to show BBT score increase > 10 to indicated reduced risk of falls.    Baseline defered to visit 2    Time 8    Period Weeks    Status New    Target Date 05/20/22               Plan - 04/16/22 1035     Clinical Impression Statement Continuing PT POC with focus on foot clearance exercises. Use of AW's as proprioceptive cuing for step heights with success with reduction in scuffing of feet. Pt does demonstrate difficulty in foot clearance with turns with heavy focus on turning activities today with VC's for decreasing speed with turns and enlarging step heights to prevent tripping LOB. Pt improves throughout session remaining highly motivated. Patient will continue to benefit from skilled physical therapy to improve his gait, decrease fall risk, improve his strength, improve his quality of life.    Examination-Activity Limitations Locomotion Level;Carry    Examination-Participation Restrictions Community Activity;Cleaning    Stability/Clinical Decision Making Stable/Uncomplicated    Clinical Decision Making High    Rehab Potential Good    PT Frequency 2x / week    PT Duration 8 weeks    PT Treatment/Interventions DME Instruction;Gait training;Stair training;Functional mobility  training;Therapeutic activities;Therapeutic exercise;Balance training;Neuromuscular re-education;Patient/family education    [facility].   [facility] focus on big steppin' with starting walk    Consulted and Agree with Plan of Care Patient                    Patient will benefit  from skilled therapeutic intervention in order to improve the following deficits and impairments:     Visit Diagnosis: No diagnosis found.     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) 02/27/2016   Suicidal ideation 02/03/2016   Body mass index (BMI) of 32.0-32.9 in adult 01/23/2016   Fatty liver 10/24/2015   Benign prostatic hyperplasia with lower urinary tract symptoms 04/08/2015   GERD (gastroesophageal reflux disease) 02/19/2015   Essential hypertension 02/18/2015   Restless legs syndrome 02/18/2015     Ollen Bowl, PT Physical Therapist- [facility]  05/13/2022, 4:57 PM

## 2022-05-14 ENCOUNTER — Ambulatory Visit: Payer: Medicare Other

## 2022-05-14 DIAGNOSIS — R262 Difficulty in walking, not elsewhere classified: Secondary | ICD-10-CM

## 2022-05-14 DIAGNOSIS — R269 Unspecified abnormalities of gait and mobility: Secondary | ICD-10-CM

## 2022-05-14 DIAGNOSIS — R296 Repeated falls: Secondary | ICD-10-CM

## 2022-05-14 DIAGNOSIS — R2681 Unsteadiness on feet: Secondary | ICD-10-CM

## 2022-05-19 ENCOUNTER — Ambulatory Visit: Payer: 59 | Attending: Internal Medicine

## 2022-05-19 DIAGNOSIS — R2681 Unsteadiness on feet: Secondary | ICD-10-CM | POA: Diagnosis present

## 2022-05-19 DIAGNOSIS — R269 Unspecified abnormalities of gait and mobility: Secondary | ICD-10-CM | POA: Diagnosis present

## 2022-05-19 DIAGNOSIS — R296 Repeated falls: Secondary | ICD-10-CM | POA: Insufficient documentation

## 2022-05-19 DIAGNOSIS — R262 Difficulty in walking, not elsewhere classified: Secondary | ICD-10-CM | POA: Diagnosis present

## 2022-05-19 NOTE — Progress Notes (Unsigned)
05/20/22 1:17 PM   Mathew Aguilar [birth year] [id number]  Referring provider:  Baxter Hire, MD [place],  [place] [address]  Urological history  1. BPH with LU TS - PSA 0.41 11/2021 - cysto 2020 - Normal prostate without significant coaptation, some bleeding with manipulation - Calculated prostate volume in 2016 was 69.8 based on sizes of 5.09 x 5.47 x 4.57 cm on CT scan - I PSS 4/1 - PVR 19 mL - doxazosin 8 mg daily and finasteride 5 mg daily   2. Urgency -Contributing factors of age, BPH, carbonated drinks, artificial sweeteners, caffeine, hypertension, diabetes, alcohol consumption, depression, diuretics, COPD and restless leg - cysto 06/2019 with Dr. Erlene Quan No significant urinary obstruction visually on cystoscopy.  No underlying pathology including tumors, masses, lesions or stones - PVR 19 mL - managed with oxybutynin XL 10 mg daily and Gemtesa 75 mg daily   3. Right renal complex cyst - CT w/wo 2016 right mid kidney lesion remains indistinct, and is too small to technically characterize. It may well be complex, but I cannot tell if it is enhancing and accordingly I can't confidently assign a Bosniak classification. The lack of significant progression compared to 04/09/13 is somewhat reassuring against malignancy - RUS 06/2019 Normal renal ultrasound  4. ED -Contributing factors of age, BPH, hypertension, diabetes, alcohol consumption, depression, COPD, alcohol consumption, blood pressure medication and BPH medication -Managed with tadalafil 20 mg on demand dosing  Chief Complaint  Patient presents with   Benign Prostatic Hypertrophy      HPI: Mathew Aguilar is a 71 y.o.male who presents today for a follow-up after decreasing his fluid intake to see if it would make an improvement in his urgency/frequency.  At his visit 3 months ago, he still had not seen improvement with his urgency and frequency even though he is on maximum medical therapy.   We discussed undergoing urodynamics, but he deferred as he wanted to try reducing his fluid intake to see if it would decrease his urgency/frequency.  He has discontinued his beer consumption and that helps decrease his urinary frequency.    Patient denies any modifying or aggravating factors.  Patient denies any gross hematuria, dysuria or suprapubic/flank pain.  Patient denies any fevers, chills, nausea or vomiting.     IPSS     Row Name 05/20/22 1200         International Prostate Symptom Score   How often have you had the sensation of not emptying your bladder? Not at All     How often have you had to urinate less than every two hours? Less than 1 in 5 times     How often have you found you stopped and started again several times when you urinated? Not at All     How often have you found it difficult to postpone urination? Not at All     How often have you had a weak urinary stream? Less than half the time     How often have you had to strain to start urination? Not at All     How many times did you typically get up at night to urinate? 1 Time     Total IPSS Score 4       Quality of Life due to urinary symptoms   If you were to spend the rest of your life with your urinary condition just the way it is now how would you feel about that? Pleased  Score:  1-7 Mild 8-19 Moderate 20-35 Severe   PMH: Past Medical History:  Diagnosis Date   Anxiety    Asthma    Borderline personality disorder ([place])    Depression    Dysrhythmia    Erectile dysfunction    Fatty liver    GERD (gastroesophageal reflux disease)    Hypertension    Hypothyroidism    Pulmonary nodule, right    Restless leg    Sleep apnea    Suicide attempt [facility])     Surgical History: Past Surgical History:  Procedure Laterality Date   AMPUTATION ARM     COLONOSCOPY     COLONOSCOPY WITH PROPOFOL N/A 08/22/2018   Procedure: COLONOSCOPY WITH PROPOFOL;  Surgeon: Lollie Sails, MD;   Location: [facility] ENDOSCOPY;  Service: Endoscopy;  Laterality: N/A;    Home Medications:  Allergies as of 05/20/2022   No Known Allergies      Medication List        Accurate as of May 20, 2022  1:17 PM. If you have any questions, ask your nurse or doctor.          Abilify Maintena 400 MG Srer injection Generic drug: ARIPiprazole ER Inject 400 mg into the skin every 30 (thirty) days.   albuterol 108 (90 Base) MCG/ACT inhaler Commonly known as: VENTOLIN HFA Inhale 2 puffs into the lungs every 6 (six) hours as needed for wheezing or shortness of breath.   atenolol 25 MG tablet Commonly known as: TENORMIN Take 25 mg by mouth daily.   doxazosin 8 MG tablet Commonly known as: CARDURA Take 1 tablet (8 mg total) by mouth daily.   doxepin 25 MG capsule Commonly known as: SINEQUAN Take 25 mg by mouth at bedtime.   DULoxetine 60 MG capsule Commonly known as: CYMBALTA Take 2 capsules (120 mg total) by mouth daily. What changed:  how much to take when to take this   ergocalciferol 1.25 MG (50000 UT) capsule Commonly known as: VITAMIN D2 Take 1 capsule by mouth once a week.   finasteride 5 MG tablet Commonly known as: PROSCAR Take 1 tablet (5 mg total) by mouth daily.   gabapentin 100 MG capsule Commonly known as: NEURONTIN Take 100 mg by mouth 3 (three) times daily.   Gemtesa 75 MG Tabs Generic drug: Vibegron Take 75 mg by mouth daily.   hydrochlorothiazide 12.5 MG capsule Commonly known as: MICROZIDE Take 1 capsule (12.5 mg total) by mouth daily.   HYDROcodone-acetaminophen 5-325 MG tablet Commonly known as: NORCO/VICODIN Take 1 tablet by mouth every 6 (six) hours as needed for moderate pain or severe pain.   lamoTRIgine 100 MG tablet Commonly known as: LAMICTAL Take 50-100 mg by mouth daily. Take 50 mg by mouth every morning and 100 mg by mouth at bedtime for two days then take 100 mg by mouth daily thereafter.   levocetirizine 5 MG tablet Commonly  known as: XYZAL Take 5 mg by mouth every evening.   levothyroxine 75 MCG tablet Commonly known as: SYNTHROID Take 1 tablet (75 mcg total) by mouth daily before breakfast.   loratadine 10 MG tablet Commonly known as: CLARITIN Take 1 tablet (10 mg total) by mouth daily.   melatonin 3 MG Tabs tablet Take 3 mg by mouth at bedtime as needed.   metFORMIN 500 MG tablet Commonly known as: GLUCOPHAGE Take 1 tablet (500 mg total) by mouth daily.   mirtazapine 15 MG tablet Commonly known as: REMERON Take 15 mg by mouth at  bedtime.   mirtazapine 30 MG tablet Commonly known as: REMERON Take 30 mg by mouth at bedtime.   nystatin ointment Commonly known as: MYCOSTATIN Apply 1 application topically 2 (two) times daily.   nystatin-triamcinolone ointment Commonly known as: MYCOLOG Apply 1 application topically 2 (two) times daily.   omega-3 acid ethyl esters 1 g capsule Commonly known as: LOVAZA Take 1 g by mouth daily.   omeprazole 20 MG capsule Commonly known as: PRILOSEC Take 20 mg by mouth daily.   oxybutynin 10 MG 24 hr tablet Commonly known as: DITROPAN-XL TAKE 1 TABLET(10 MG) BY MOUTH DAILY   oxybutynin 10 MG 24 hr tablet Commonly known as: DITROPAN-XL TAKE 1 TABLET(10 MG) BY MOUTH DAILY   pramipexole 1 MG tablet Commonly known as: MIRAPEX Take by mouth.   primidone 50 MG tablet Commonly known as: MYSOLINE Take by mouth at bedtime.   rOPINIRole 1 MG tablet Commonly known as: REQUIP Take 1 mg by mouth 3 (three) times daily.   rosuvastatin 10 MG tablet Commonly known as: CRESTOR Take 10 mg by mouth at bedtime.   sulfamethoxazole-trimethoprim 800-160 MG tablet Commonly known as: BACTRIM DS Take 1 tablet by mouth every 12 (twelve) hours.   tadalafil 20 MG tablet Commonly known as: CIALIS Take by mouth.   temazepam 15 MG capsule Commonly known as: RESTORIL Take 15 mg by mouth daily.   traZODone 100 MG tablet Commonly known as: DESYREL Take 100 mg by  mouth at bedtime.   triamcinolone 0.025 % cream Commonly known as: KENALOG Apply 1 application topically 2 (two) times daily.        Allergies:  No Known Allergies  Family History: Family History  Family history unknown: Yes    Social History:  reports that he has never smoked. His smokeless tobacco use includes chew. He reports current alcohol use. He reports that he does not use drugs.   Physical Exam: BP 110/69   Pulse 69   Ht 5' (1.524 m)   Wt 152 lb (68.9 kg)   BMI 29.69 kg/m   Constitutional:  Well nourished. Alert and oriented, No acute distress. HEENT: [place] AT, moist mucus membranes.  Trachea midline Cardiovascular: No clubbing, cyanosis, or edema. Neurologic: Grossly intact, no focal deficits, moving all 3 extremities. Psychiatric: Normal mood and affect.   Laboratory Data: Glucose 70 - 110 mg/dL 109   Sodium 136 - 145 mmol/L 137   Potassium 3.6 - 5.1 mmol/L 4.3   Chloride 97 - 109 mmol/L 103   Carbon Dioxide (CO2) 22.0 - 32.0 mmol/L 24.5   Calcium 8.7 - 10.3 mg/dL 9.5   Urea Nitrogen (BUN) 7 - 25 mg/dL 16   Creatinine 0.7 - 1.3 mg/dL 0.6 Low    Glomerular Filtration Rate (eGFR), MDRD Estimate >60 mL/min/1.73sq m 133   BUN/Crea Ratio 6.0 - 20.0 26.7 High    Anion Gap w/K 6.0 - 16.0 13.8   Resulting Agency  [facility] - LAB   Specimen Collected: 03/30/22 08:11   Performed by: [facility] Last Resulted: 03/30/22 11:06  Received From: [facility]  Result Received: 03/31/22 07:44   Hemoglobin A1C 4.2 - 5.6 % 6.6 High    Average Blood Glucose (Calc) mg/dL 143   Resulting Agency  [facility] - [facility]  Narrative Performed by [facility] - [facility] Normal Range:    4.2 - 5.6%  Increased Risk:  5.7 - 6.4%  Diabetes:        >=  6.5%  Glycemic Control for adults with diabetes:  <7%    Specimen Collected: 03/30/22 08:11   Performed by: [facility]: 03/30/22 09:37  Received From:  [facility]  Result Received: 03/31/22 07:44   Thyroid Stimulating Hormone (TSH) 0.450-5.330 uIU/ml uIU/mL 2.179   Resulting Agency  [facility] - LAB   Specimen Collected: 03/30/22 08:11   Performed by: [facility]: 03/30/22 10:11  Received From: [facility]  Result Received: 03/31/22 07:44  I have reviewed the labs.    Pertinent Imaging:  05/20/22 12:58  Scan Result 19    Assessment & Plan:    1. BPH with LU TS -PVR minimal -continues doxazosin 8 mg daily and finasteride 5 mg daily   2. Urgency/frequency -Resolved with the cessation of beer consumption -Congratulated him on his stopping his beer intake and encouraged him to continue abstaining  Return in about 1 year (around 05/21/2023) for IPSS, PSA, PVR and exam.  Zara Council, [place] [address] [place], [place] [address] [phone]

## 2022-05-19 NOTE — Therapy (Signed)
[facility] [address] [place], [place], [address] Phone: [phone]   Fax:  [phone]  Physical Therapy Treatment/Physical Therapy Progress Note   Dates of reporting period  03/25/2022   to   05/19/2022   Patient Details  Name: Mathew Aguilar MRN: [id number] Date of Birth: 1951-01-11 Referring Provider (PT): Dr. Harrel Lemon (PCP)   Encounter Date: 05/19/2022   PT End of Session - 05/19/22 0852     Visit Number 10    Number of Visits 16    Date for PT Re-Evaluation 05/20/22    Authorization Type UHC Medicare, Medicaid secondary    Authorization Time Period 03/25/22-06/17/22    Progress Note Due on Visit 10    PT Start Time 0845    PT Stop Time 0920    PT Time Calculation (min) 35 min    Equipment Utilized During Treatment Gait belt    Activity Tolerance Patient tolerated treatment well;No increased pain    Behavior During Therapy WFL for tasks assessed/performed               Past Medical History:  Diagnosis Date   Anxiety    Asthma    Borderline personality disorder ([place])    Depression    Dysrhythmia    Erectile dysfunction    Fatty liver    GERD (gastroesophageal reflux disease)    Hypertension    Hypothyroidism    Pulmonary nodule, right    Restless leg    Sleep apnea    Suicide attempt [facility])     Past Surgical History:  Procedure Laterality Date   AMPUTATION ARM     COLONOSCOPY     COLONOSCOPY WITH PROPOFOL N/A 08/22/2018   Procedure: COLONOSCOPY WITH PROPOFOL;  Surgeon: Lollie Sails, MD;  Location: [facility] ENDOSCOPY;  Service: Endoscopy;  Laterality: N/A;    Subjective Assessment -    Subjective Patient reports continuing to do well- Reports using cane for longer distances or unlevel outdoor walking and trying to walk without a device for all indoor mobility.    Pertinent History Mathew Aguilar is a 43yoM who comes to [facility] OPPT neuro referred by PCP after acute insidious onset  difficulty with walking that began roughly 4 weeks ago upon waking that morning. Pt reports his issue to solely be related to difficulty with inititation of gait. Pt reports several falls assicated with this new problem. Pt denies any history of prior gait difficult or falls, denies any current or previous changes in strength, sensorium, no history or associated diziness, no LOC, no presyncope. Pt reports only mild improvement since onset. Pt was seen by PCP in office for this issue on 02/24/22 and referred to PT for referral. No imaging has been done, no referral to other provides.             There were no vitals filed for this visit.    INTERVENTION: 05/19/22  Reassessed goals for progress note - visit # 10. See goal section for specific details.     Education provided throughout session via VC/TC and demonstration to facilitate movement at target joints and correct muscle activation for all testing and exercises performed.    Rationale for Evaluation and Treatment Rehabilitation       PT Short Term Goals - 03/25/22 1541       PT SHORT TERM GOAL #1   Title Pt to reports decrease of falls at home <1x/month.    Baseline eval: 2-3/week  since onset; 05/19/2022= Patient reports only 1 fall in past 3 weeks.    Time 4    Period Weeks    Status ONGOING   Target Date 04/22/22      PT SHORT TERM GOAL #2   Title Pt to report successful use of SPC for resumption of AMB activity, rpeorts no longer limitng activity due to fear of falling.    Baseline eval: avoiding much wlaking due ot fear of falling; 05/19/2022= Patient reports walking shorter distances without cane and longer distances with cane ex: walking to mailbox.   Time 4    Period Weeks    Status GOAL MET   Target Date 04/22/22               PT Long Term Goals - 03/25/22 1543       PT LONG TERM GOAL #1   Title Pt to show improved FOTO score > 15 points to indicated improved feelings of confidence in balance     Baseline eval: 49; 05/19/2022= 44   Time 8    Period Weeks    Status ONGOING    Target Date 05/20/22      PT LONG TERM GOAL #2   Title Pt to demonstrate 6MWT >1468f c LRAD without any LOB at modI level to indicate ability to safely perform limited community AMB.    Baseline eval: learning SPC use; 05/19/2022= 965 feet with no device- Initial shuffle but vastly improved with distance.   Time 8    Period Weeks    Status ONGOING   Target Date 05/20/22      PT LONG TERM GOAL #3   Title Pt to show BBT score increase > 10 to indicated reduced risk of falls.    Baseline defered to visit 2; 05/19/2022= 44/56   Time 8    Period Weeks    Status ONGOING   Target Date 05/20/22               Plan - 04/16/22 1035     Clinical Impression Statement Patient presents with good motivation for today's session- Terminated slightly early as patient arrived 45 min ahead of scheduled appointment. He was able to complete the 6 min walk test and BERG balance test as previously unscored- and has room/potential for improvement with both and he does present below normative values with 6 min walk and increased risk of falls with BERG. He did improve from initial shuffle to more reciprocal steps without foot drag. He did score slightly worse on FOTO but may be due to realizing his deficits were more than he thought since initiation of PT services.   Patient will continue to benefit from skilled physical therapy to improve his gait, decrease fall risk, improve his strength, improve his quality of life. Patient's condition has the potential to improve in response to therapy. Maximum improvement is yet to be obtained. The anticipated improvement is attainable and reasonable in a generally predictable time.       Examination-Activity Limitations Locomotion Level;Carry    Examination-Participation Restrictions Community Activity;Cleaning    Stability/Clinical Decision Making Stable/Uncomplicated    Clinical Decision  Making High    Rehab Potential Good    PT Frequency 2x / week    PT Duration 8 weeks    PT Treatment/Interventions DME Instruction;Gait training;Stair training;Functional mobility training;Therapeutic activities;Therapeutic exercise;Balance training;Neuromuscular re-education;Patient/family education    PT [facility] and continue balance training.   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[place]) 05/30/2020   Anxiety 05/30/2020   Erectile dysfunction 05/30/2020   History of avoidant personality disorder 05/30/2020   History of dysthymic disorder 05/30/2020   History of suicide attempt 05/30/2020   Hypothyroidism, acquired 05/30/2020   Diet-controlled type 2 diabetes mellitus ([place]) 11/30/2016   Chronic insomnia 08/25/2016   COPD (chronic obstructive pulmonary disease) ([place]) 04/03/2016   Alcohol use disorder, mild, abuse 02/27/2016   Severe recurrent major depression with psychotic features ([place]) 02/27/2016   Suicidal ideation 02/03/2016   Body mass index (BMI) of 32.0-32.9 in adult 01/23/2016   Fatty liver 10/24/2015   Benign prostatic hyperplasia with lower urinary tract symptoms 04/08/2015   GERD (gastroesophageal reflux disease) 02/19/2015   Essential hypertension 02/18/2015   Restless legs syndrome 02/18/2015     Ollen Bowl, PT Physical Therapist- [facility]  05/19/2022, 10:01 AM

## 2022-05-20 ENCOUNTER — Encounter: Payer: Self-pay | Admitting: Urology

## 2022-05-20 ENCOUNTER — Other Ambulatory Visit: Payer: Self-pay

## 2022-05-20 ENCOUNTER — Ambulatory Visit (INDEPENDENT_AMBULATORY_CARE_PROVIDER_SITE_OTHER): Payer: Medicare Other | Admitting: Urology

## 2022-05-20 VITALS — BP 110/69 | HR 69 | Ht 60.0 in | Wt 152.0 lb

## 2022-05-20 DIAGNOSIS — N138 Other obstructive and reflux uropathy: Secondary | ICD-10-CM

## 2022-05-20 DIAGNOSIS — R350 Frequency of micturition: Secondary | ICD-10-CM | POA: Diagnosis not present

## 2022-05-20 DIAGNOSIS — N401 Enlarged prostate with lower urinary tract symptoms: Secondary | ICD-10-CM | POA: Diagnosis not present

## 2022-05-20 LAB — BLADDER SCAN AMB NON-IMAGING: Scan Result: 19

## 2022-05-21 ENCOUNTER — Telehealth: Payer: Self-pay | Admitting: Physical Therapy

## 2022-05-21 ENCOUNTER — Ambulatory Visit: Payer: 59 | Admitting: Physical Therapy

## 2022-05-21 NOTE — Telephone Encounter (Signed)
Called and lvm to call back if he wants the 11:15 slot.

## 2022-05-22 ENCOUNTER — Ambulatory Visit: Payer: 59

## 2022-05-22 DIAGNOSIS — R2681 Unsteadiness on feet: Secondary | ICD-10-CM

## 2022-05-22 DIAGNOSIS — R296 Repeated falls: Secondary | ICD-10-CM

## 2022-05-22 DIAGNOSIS — R262 Difficulty in walking, not elsewhere classified: Secondary | ICD-10-CM

## 2022-05-22 DIAGNOSIS — R269 Unspecified abnormalities of gait and mobility: Secondary | ICD-10-CM

## 2022-05-22 NOTE — Therapy (Signed)
[place] MAIN [facility] SERVICES [address] [place], [place], [address] Phone: [phone]   Fax:  [phone]  Physical Therapy Treatment  Patient Details  Name: Mathew Aguilar MRN: [id number] Date of Birth: 1951/05/22 Referring Provider (PT): Dr. Harrel Lemon (PCP)   Encounter Date: 05/22/2022   PT End of Session - 05/22/22 0842     Visit Number 11    Number of Visits 16    Date for PT Re-Evaluation 05/20/22    Authorization Type UHC Medicare, Medicaid secondary    Authorization Time Period 03/25/22-06/17/22    Progress Note Due on Visit 10    PT Start Time 0836    PT Stop Time 0910    PT Time Calculation (min) 34 min    Equipment Utilized During Treatment Gait belt    Activity Tolerance Patient tolerated treatment well;No increased pain    Behavior During Therapy WFL for tasks assessed/performed               Past Medical History:  Diagnosis Date   Anxiety    Asthma    Borderline personality disorder ([place])    Depression    Dysrhythmia    Erectile dysfunction    Fatty liver    GERD (gastroesophageal reflux disease)    Hypertension    Hypothyroidism    Pulmonary nodule, right    Restless leg    Sleep apnea    Suicide attempt [id number])     Past Surgical History:  Procedure Laterality Date   AMPUTATION ARM     COLONOSCOPY     COLONOSCOPY WITH PROPOFOL N/A 08/22/2018   Procedure: COLONOSCOPY WITH PROPOFOL;  Surgeon: Lollie Sails, MD;  Location: [facility] ENDOSCOPY;  Service: Endoscopy;  Laterality: N/A;    Subjective Assessment -    Subjective Patient reports doing well- States he got his appointment times mixed up. Reports walked to mailbox and back yesterday without his cane.    Pertinent History Mathew Aguilar is a 20yoM who comes to [facility] OPPT neuro referred by PCP after acute insidious onset difficulty with walking that began roughly 4 weeks ago upon waking that morning. Pt reports his issue to solely be related to  difficulty with inititation of gait. Pt reports several falls assicated with this new problem. Pt denies any history of prior gait difficult or falls, denies any current or previous changes in strength, sensorium, no history or associated diziness, no LOC, no presyncope. Pt reports only mild improvement since onset. Pt was seen by PCP in office for this issue on 02/24/22 and referred to PT for referral. No imaging has been done, no referral to other provides.             There were no vitals filed for this visit.   TODAY's INTERVENTIONS: 05/19/22  THEREX:   Nustep- Patient performed at seat level 5 and instructed to use BLE only. VC to maintain SPM > 60  Level 1 - 3 min Level 3 - 3 min  Sit to stand x 12 without UE support  Neuromuscular re-ed:   Step tap onto 1st step at stairs without railing- x 20 reps with VC for slow controlled pace.   Tandem walking on 2x4 board in // bars x 10 down and back  without UE (mild unsteadiness with occasional reaching 1 UE support but did improve with each round and on last 3- no UE support/touch)   Resistive gait at 17.5# using matrix cable system- approx 12 feet -  x 10 forward then positioned for retro gait- Initial shuffle and difficulty maintaining cadence control but did improve with practice.   *Treatment abbreviated due to patient receiving a call and needed to leave to help someone out.   Education provided throughout session via VC/TC and demonstration to facilitate movement at target joints and correct muscle activation for all testing and exercises performed.    Rationale for Evaluation and Treatment Rehabilitation       PT Short Term Goals - 03/25/22 1541       PT SHORT TERM GOAL #1   Title Pt to reports decrease of falls at home <1x/month.    Baseline eval: 2-3/week since onset; 05/19/2022= Patient reports only 1 fall in past 3 weeks.    Time 4    Period Weeks    Status ONGOING   Target Date 04/22/22      PT SHORT TERM GOAL  #2   Title Pt to report successful use of SPC for resumption of AMB activity, rpeorts no longer limitng activity due to fear of falling.    Baseline eval: avoiding much wlaking due ot fear of falling; 05/19/2022= Patient reports walking shorter distances without cane and longer distances with cane ex: walking to mailbox.   Time 4    Period Weeks    Status GOAL MET   Target Date 04/22/22               PT Long Term Goals - 03/25/22 1543       PT LONG TERM GOAL #1   Title Pt to show improved FOTO score > 15 points to indicated improved feelings of confidence in balance    Baseline eval: 49; 05/19/2022= 44   Time 8    Period Weeks    Status ONGOING    Target Date 05/20/22      PT LONG TERM GOAL #2   Title Pt to demonstrate 6MWT >1490f c LRAD without any LOB at modI level to indicate ability to safely perform limited community AMB.    Baseline eval: learning SPC use; 05/19/2022= 965 feet with no device- Initial shuffle but vastly improved with distance.   Time 8    Period Weeks    Status ONGOING   Target Date 05/20/22      PT LONG TERM GOAL #3   Title Pt to show BBT score increase > 10 to indicated reduced risk of falls.    Baseline defered to visit 2; 05/19/2022= 44/56   Time 8    Period Weeks    Status ONGOING   Target Date 05/20/22               Plan - 04/16/22 1035     Clinical Impression Statement  Patient presents with excellent motivation- much improved with PT interventions including balance activities challenging his shuffling nature with gait. He did improve with dynamic balance with tandem requiring less UE support and improved steadiness and later with resistive gait -exhibiting decreased shuffling and ability to adapt to exercise and improve his performance to control his cadence. Patient will continue to benefit from skilled physical therapy to improve his gait, decrease fall risk, improve his strength, improve his quality of life. Patient's condition has the  potential to improve in response to therapy.       Examination-Activity Limitations Locomotion Level;Carry    Examination-Participation Restrictions Community Activity;Cleaning    Stability/Clinical Decision Making Stable/Uncomplicated    Clinical Decision Making High    Rehab Potential Good  PT Frequency 2x / week    PT Duration 8 weeks    PT Treatment/Interventions DME Instruction;Gait training;Stair training;Functional mobility training;Therapeutic activities;Therapeutic exercise;Balance training;Neuromuscular re-education;Patient/family education    PT Next Visit Plan Review [facility] training and continue balance training.   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[place]) 05/30/2020   Anxiety 05/30/2020   Erectile dysfunction 05/30/2020   History of avoidant personality disorder 05/30/2020   History of dysthymic disorder 05/30/2020   History of suicide attempt 05/30/2020   Hypothyroidism, acquired 05/30/2020   Diet-controlled type 2 diabetes mellitus ([place]) 11/30/2016   Chronic insomnia 08/25/2016   COPD (chronic obstructive pulmonary disease) ([place]) 04/03/2016   Alcohol use disorder, mild, abuse 02/27/2016   Severe recurrent major depression with psychotic features ([place]) 02/27/2016   Suicidal ideation 02/03/2016   Body mass index (BMI) of 32.0-32.9 in adult 01/23/2016   Fatty liver 10/24/2015   Benign prostatic hyperplasia with lower urinary tract symptoms 04/08/2015   GERD (gastroesophageal  reflux disease) 02/19/2015   Essential hypertension 02/18/2015   Restless legs syndrome 02/18/2015     Ollen Bowl, PT Physical Therapist- [facility]  05/22/2022, 9:27 AM

## 2022-05-26 ENCOUNTER — Ambulatory Visit: Payer: 59 | Admitting: Physical Therapy

## 2022-05-26 ENCOUNTER — Encounter: Payer: Self-pay | Admitting: Physical Therapy

## 2022-05-26 DIAGNOSIS — R262 Difficulty in walking, not elsewhere classified: Secondary | ICD-10-CM

## 2022-05-26 DIAGNOSIS — R296 Repeated falls: Secondary | ICD-10-CM

## 2022-05-26 DIAGNOSIS — R269 Unspecified abnormalities of gait and mobility: Secondary | ICD-10-CM

## 2022-05-26 DIAGNOSIS — R2681 Unsteadiness on feet: Secondary | ICD-10-CM

## 2022-05-26 NOTE — Therapy (Signed)
[place] MAIN [facility] SERVICES [address] [place], [place], [address] Phone: [phone]   Fax:  [phone]  Physical Therapy Treatment  Patient Details  Name: Mathew Aguilar MRN: [id number] Date of Birth: 1951/05/29 Referring Provider (PT): Dr. Harrel Lemon (PCP)   Encounter Date: 05/26/2022   PT End of Session - 05/26/22 0754     Visit Number 12    Number of Visits 16    Date for PT Re-Evaluation 06/17/22    Authorization Type UHC Medicare, Medicaid secondary    Authorization Time Period 03/25/22-06/17/22    Progress Note Due on Visit 20    PT Start Time 0802    PT Stop Time 0845    PT Time Calculation (min) 43 min    Equipment Utilized During Treatment Gait belt    Activity Tolerance Patient tolerated treatment well;No increased pain    Behavior During Therapy WFL for tasks assessed/performed               Past Medical History:  Diagnosis Date   Anxiety    Asthma    Borderline personality disorder ([place])    Depression    Dysrhythmia    Erectile dysfunction    Fatty liver    GERD (gastroesophageal reflux disease)    Hypertension    Hypothyroidism    Pulmonary nodule, right    Restless leg    Sleep apnea    Suicide attempt [facility])     Past Surgical History:  Procedure Laterality Date   AMPUTATION ARM     COLONOSCOPY     COLONOSCOPY WITH PROPOFOL N/A 08/22/2018   Procedure: COLONOSCOPY WITH PROPOFOL;  Surgeon: Lollie Sails, MD;  Location: [facility] ENDOSCOPY;  Service: Endoscopy;  Laterality: N/A;    Subjective Assessment -    Subjective Pt reports he is not having pain at this time. He report overall he is doing well and has not had any falls or LOB since last session.    Pertinent History Mathew Aguilar is a 27yoM who comes to [facility] OPPT neuro referred by PCP after acute insidious onset difficulty with walking that began roughly 4 weeks ago upon waking that morning. Pt reports his issue to solely be related to  difficulty with inititation of gait. Pt reports several falls assicated with this new problem. Pt denies any history of prior gait difficult or falls, denies any current or previous changes in strength, sensorium, no history or associated diziness, no LOC, no presyncope. Pt reports only mild improvement since onset. Pt was seen by PCP in office for this issue on 02/24/22 and referred to PT for referral. No imaging has been done, no referral to other provides.             There were no vitals filed for this visit.   TODAY's INTERVENTIONS: 05/26/22     Neuromuscular re-ed:   Ladder drill with STS transition after each time through ladder to work on shuffling gait upon first few steps after standing.  X 8 lengths of ladder with SPC, many errors with LE hitting ladder on first couple times through, with increased practice pt abel to make it throuhgh with 1 or less errors on last 3-4 times through ladder indicating improved step length and decreased shuffling.  X 8 engths through without SPC, good step length on most rounds. Pt required cues for step width without cane as he attempted to widen BOS to accomoodate to not having cane for balance, no LOB  throughout.   Step tap onto 1st step at stairs without railing on airex pad- x 25 reps ea LE, cues for controlling R LE with concentric and eccentric movement as it tends to clip step or not get repositioned properly between reps.   Step taps from stable surface to 4 inch step plus yoga block x 20 ea LE, yoga block for feedback regarding foot placement and also picking up feet prior to stepping ( yoga block moves if he slides foot off of it or onto it), good balance, improvement in sliding foot and foot clearance with reps.   Tandem walking on 2x4 board in // bars x 10 down and back  without UE (mild unsteadiness with occasional reaching 1 UE support but did improve with each round)  Resistive gait at 17.5# using matrix cable system- approx 12 feet  - x 3 forward and x 3 retro gait, some shuffling noted with initiation of retro gait.   In parallel bars Airex pad followed by Airex balance beam followed by Airex pad with 180 degree turns on Airex pad x4 extensions through this. -Also perform sidestepping x4 extensions through.  Increased difficulty on first couple rounds but then improved that require upper extremity assist and second to extensions through.  Education provided throughout session via VC/TC and demonstration to facilitate movement at target joints and correct muscle activation for all testing and exercises performed.    Rationale for Evaluation and Treatment Rehabilitation       PT Short Term Goals - 03/25/22 1541       PT SHORT TERM GOAL #1   Title Pt to reports decrease of falls at home <1x/month.    Baseline eval: 2-3/week since onset; 05/19/2022= Patient reports only 1 fall in past 3 weeks.    Time 4    Period Weeks    Status ONGOING   Target Date 04/22/22      PT SHORT TERM GOAL #2   Title Pt to report successful use of SPC for resumption of AMB activity, rpeorts no longer limitng activity due to fear of falling.    Baseline eval: avoiding much wlaking due ot fear of falling; 05/19/2022= Patient reports walking shorter distances without cane and longer distances with cane ex: walking to mailbox.   Time 4    Period Weeks    Status GOAL MET   Target Date 04/22/22               PT Long Term Goals - 03/25/22 1543       PT LONG TERM GOAL #1   Title Pt to show improved FOTO score > 15 points to indicated improved feelings of confidence in balance    Baseline eval: 49; 05/19/2022= 44   Time 8    Period Weeks    Status ONGOING    Target Date 05/20/22      PT LONG TERM GOAL #2   Title Pt to demonstrate 6MWT >1455f c LRAD without any LOB at modI level to indicate ability to safely perform limited community AMB.    Baseline eval: learning SPC use; 05/19/2022= 965 feet with no device- Initial shuffle but  vastly improved with distance.   Time 8    Period Weeks    Status ONGOING   Target Date 05/20/22      PT LONG TERM GOAL #3   Title Pt to show BBT score increase > 10 to indicated reduced risk of falls.    Baseline defered to visit 2;  05/19/2022= 44/56   Time 8    Period Weeks    Status ONGOING   Target Date 05/20/22               Plan - 04/16/22 1035     Clinical Impression Statement Entire session performed in target of motor control training, spatial awareness. Pt purposefully given interventions to create frequent LOB to allow for high volume practice of righting strategies with physical assistance provided only as needed. Entire session performed with gait belt donned and minGuard assist of patient.  Pt will continue to benefit from skilled physical therapy intervention to address impairments, improve QOL, and attain therapy goals.        Examination-Activity Limitations Locomotion Level;Carry    Examination-Participation Restrictions Community Activity;Cleaning    Stability/Clinical Decision Making Stable/Uncomplicated    Clinical Decision Making High    Rehab Potential Good    PT Frequency 2x / week    PT Duration 8 weeks    PT Treatment/Interventions DME Instruction;Gait training;Stair training;Functional mobility training;Therapeutic activities;Therapeutic exercise;Balance training;Neuromuscular re-education;Patient/family education    PT Next Visit Plan Review [facility] training and continue balance training.   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[place]) 05/30/2020   Anxiety 05/30/2020   Erectile dysfunction 05/30/2020   History of avoidant personality disorder 05/30/2020   History of dysthymic disorder 05/30/2020   History of suicide attempt 05/30/2020   Hypothyroidism, acquired 05/30/2020   Diet-controlled type 2 diabetes mellitus ([place]) 11/30/2016   Chronic insomnia 08/25/2016   COPD (chronic obstructive pulmonary disease) ([place]) 04/03/2016   Alcohol use disorder, mild, abuse 02/27/2016   Severe recurrent major depression with psychotic features ([place]) 02/27/2016   Suicidal ideation 02/03/2016   Body mass index (BMI) of 32.0-32.9 in adult 01/23/2016   Fatty liver 10/24/2015   Benign prostatic hyperplasia with lower urinary tract symptoms 04/08/2015   GERD (gastroesophageal reflux disease) 02/19/2015   Essential hypertension 02/18/2015   Restless legs syndrome 02/18/2015    [facility]  05/26/2022, 8:51 AM

## 2022-05-28 ENCOUNTER — Ambulatory Visit: Payer: 59

## 2022-05-28 DIAGNOSIS — R262 Difficulty in walking, not elsewhere classified: Secondary | ICD-10-CM

## 2022-05-28 DIAGNOSIS — R269 Unspecified abnormalities of gait and mobility: Secondary | ICD-10-CM

## 2022-05-28 DIAGNOSIS — R2681 Unsteadiness on feet: Secondary | ICD-10-CM

## 2022-05-28 DIAGNOSIS — R296 Repeated falls: Secondary | ICD-10-CM

## 2022-05-28 NOTE — Therapy (Signed)
[place] MAIN [facility] SERVICES [address] [place], [place], [address] Phone: [phone]   Fax:  [phone]  Physical Therapy Treatment  Patient Details  Name: Mathew Aguilar MRN: [id number] Date of Birth: Aug 08, 1951 Referring Provider (PT): Dr. Harrel Lemon (PCP)   Encounter Date: 05/28/2022   PT End of Session - 05/28/22 0854     Visit Number 13    Number of Visits 16    Date for PT Re-Evaluation 06/17/22    Authorization Type UHC Medicare, Medicaid secondary    Authorization Time Period 03/25/22-06/17/22    Progress Note Due on Visit 20    PT Start Time 0846    PT Stop Time 0927    PT Time Calculation (min) 41 min    Equipment Utilized During Treatment Gait belt    Activity Tolerance Patient tolerated treatment well;No increased pain    Behavior During Therapy WFL for tasks assessed/performed               Past Medical History:  Diagnosis Date   Anxiety    Asthma    Borderline personality disorder ([place])    Depression    Dysrhythmia    Erectile dysfunction    Fatty liver    GERD (gastroesophageal reflux disease)    Hypertension    Hypothyroidism    Pulmonary nodule, right    Restless leg    Sleep apnea    Suicide attempt [facility])     Past Surgical History:  Procedure Laterality Date   AMPUTATION ARM     COLONOSCOPY     COLONOSCOPY WITH PROPOFOL N/A 08/22/2018   Procedure: COLONOSCOPY WITH PROPOFOL;  Surgeon: Lollie Sails, MD;  Location: [facility] ENDOSCOPY;  Service: Endoscopy;  Laterality: N/A;    Subjective Assessment -    Subjective Pt reports he is not having pain at this time. He report overall he is doing well and has not had any falls or LOB since last session.    Pertinent History Mathew Aguilar is a 37yoM who comes to [facility] OPPT neuro referred by PCP after acute insidious onset difficulty with walking that began roughly 4 weeks ago upon waking that morning. Pt reports his issue to solely be related to  difficulty with inititation of gait. Pt reports several falls assicated with this new problem. Pt denies any history of prior gait difficult or falls, denies any current or previous changes in strength, sensorium, no history or associated diziness, no LOC, no presyncope. Pt reports only mild improvement since onset. Pt was seen by PCP in office for this issue on 02/24/22 and referred to PT for referral. No imaging has been done, no referral to other provides.             There were no vitals filed for this visit.   TODAY's INTERVENTIONS: 05/28/22  Therex:   Seated knee ext with 3# AW 2sets of 12 reps Seated hip march with 3# AW 2 sets of 12 reps Sit to stand with 1 foot on airex pad - 10 reps then switch pad under opp LE x 10 reps (patient performed well without UE support)    Neuromuscular re-ed:   Forward step in // bars with 3# AW (VC to take large steps)- initially required 7 steps from one end of bars to other but improved to 5 steps with practice completing 10 rounds - down and back  Retro step in // bars with 3# AW (VC to take large steps)- initially  required 10 steps from one end of bars to other but improved to 7 steps with practice completing 10 rounds - down and back  Step tap onto 1st step at stairs without railing on airex pad- x 25 reps ea LE  Tandem walking on 2x4 board in // bars x 10 down and back  without UE (mild unsteadiness with occasional reaching 1 UE support but did improve with each round) Side step  walking on 2x4 board in // bars x 10 down and back  without UE (mild unsteadiness with occasional reaching 1 UE support but did improve with each round)  Education provided throughout session via VC/TC and demonstration to facilitate movement at target joints and correct muscle activation for all testing and exercises performed.    Rationale for Evaluation and Treatment Rehabilitation       PT Short Term Goals - 03/25/22 1541       PT SHORT TERM GOAL #1    Title Pt to reports decrease of falls at home <1x/month.    Baseline eval: 2-3/week since onset; 05/19/2022= Patient reports only 1 fall in past 3 weeks.    Time 4    Period Weeks    Status ONGOING   Target Date 04/22/22      PT SHORT TERM GOAL #2   Title Pt to report successful use of SPC for resumption of AMB activity, rpeorts no longer limitng activity due to fear of falling.    Baseline eval: avoiding much wlaking due ot fear of falling; 05/19/2022= Patient reports walking shorter distances without cane and longer distances with cane ex: walking to mailbox.   Time 4    Period Weeks    Status GOAL MET   Target Date 04/22/22               PT Long Term Goals - 03/25/22 1543       PT LONG TERM GOAL #1   Title Pt to show improved FOTO score > 15 points to indicated improved feelings of confidence in balance    Baseline eval: 49; 05/19/2022= 44   Time 8    Period Weeks    Status ONGOING    Target Date 05/20/22      PT LONG TERM GOAL #2   Title Pt to demonstrate 6MWT >1459f c LRAD without any LOB at modI level to indicate ability to safely perform limited community AMB.    Baseline eval: learning SPC use; 05/19/2022= 965 feet with no device- Initial shuffle but vastly improved with distance.   Time 8    Period Weeks    Status ONGOING   Target Date 05/20/22      PT LONG TERM GOAL #3   Title Pt to show BBT score increase > 10 to indicated reduced risk of falls.    Baseline defered to visit 2; 05/19/2022= 44/56   Time 8    Period Weeks    Status ONGOING   Target Date 05/20/22               Plan - 04/16/22 1035     Clinical Impression Statement Entire session performed in target of motor control training, spatial awareness. Pt purposefully given interventions to create frequent LOB to allow for high volume practice of righting strategies with physical assistance provided only as needed. Entire session performed with gait belt donned and minGuard assist of patient.  Pt  will continue to benefit from skilled physical therapy intervention to address impairments, improve QOL, and  attain therapy goals.        Examination-Activity Limitations Locomotion Level;Carry    Examination-Participation Restrictions Community Activity;Cleaning    Stability/Clinical Decision Making Stable/Uncomplicated    Clinical Decision Making High    Rehab Potential Good    PT Frequency 2x / week    PT Duration 8 weeks    PT Treatment/Interventions DME Instruction;Gait training;Stair training;Functional mobility training;Therapeutic activities;Therapeutic exercise;Balance training;Neuromuscular re-education;Patient/family education    PT Next Visit Plan Review [facility] training and continue balance training.   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[place]) 05/30/2020   Anxiety 05/30/2020   Erectile dysfunction 05/30/2020   History of avoidant personality disorder 05/30/2020   History of dysthymic disorder 05/30/2020   History of suicide attempt 05/30/2020   Hypothyroidism, acquired 05/30/2020   Diet-controlled type 2 diabetes mellitus ([place]) 11/30/2016   Chronic insomnia 08/25/2016   COPD (chronic obstructive pulmonary disease) ([place]) 04/03/2016   Alcohol use disorder, mild, abuse 02/27/2016   Severe recurrent major depression with psychotic features ([place]) 02/27/2016   Suicidal ideation 02/03/2016   Body mass index (BMI) of 32.0-32.9 in adult 01/23/2016   Fatty liver 10/24/2015   Benign  prostatic hyperplasia with lower urinary tract symptoms 04/08/2015   GERD (gastroesophageal reflux disease) 02/19/2015   Essential hypertension 02/18/2015   Restless legs syndrome 02/18/2015    [facility]  05/28/2022, 5:02 PM

## 2022-06-02 ENCOUNTER — Encounter: Payer: Self-pay | Admitting: Physical Therapy

## 2022-06-02 ENCOUNTER — Ambulatory Visit: Payer: 59 | Admitting: Physical Therapy

## 2022-06-02 DIAGNOSIS — R2681 Unsteadiness on feet: Secondary | ICD-10-CM

## 2022-06-02 DIAGNOSIS — R296 Repeated falls: Secondary | ICD-10-CM

## 2022-06-02 DIAGNOSIS — R262 Difficulty in walking, not elsewhere classified: Secondary | ICD-10-CM

## 2022-06-02 DIAGNOSIS — R269 Unspecified abnormalities of gait and mobility: Secondary | ICD-10-CM

## 2022-06-02 NOTE — Therapy (Signed)
[place] MAIN [facility] SERVICES [address] [place], [place], [address] Phone: [phone]   Fax:  [phone]  Physical Therapy Treatment  Patient Details  Name: Mathew Aguilar MRN: [id number] Date of Birth: 10-12-1950 Referring Provider (PT): Dr. Harrel Lemon (PCP)   Encounter Date: 06/02/2022   PT End of Session - 06/02/22 1152     Visit Number 14    Number of Visits 16    Date for PT Re-Evaluation 06/17/22    Authorization Type UHC Medicare, Medicaid secondary    Authorization Time Period 03/25/22-06/17/22    Progress Note Due on Visit 20    PT Start Time 1150    PT Stop Time 1228    PT Time Calculation (min) 38 min    Equipment Utilized During Treatment Gait belt    Activity Tolerance Patient tolerated treatment well;No increased pain    Behavior During Therapy WFL for tasks assessed/performed                Past Medical History:  Diagnosis Date   Anxiety    Asthma    Borderline personality disorder ([place])    Depression    Dysrhythmia    Erectile dysfunction    Fatty liver    GERD (gastroesophageal reflux disease)    Hypertension    Hypothyroidism    Pulmonary nodule, right    Restless leg    Sleep apnea    Suicide attempt [facility])     Past Surgical History:  Procedure Laterality Date   AMPUTATION ARM     COLONOSCOPY     COLONOSCOPY WITH PROPOFOL N/A 08/22/2018   Procedure: COLONOSCOPY WITH PROPOFOL;  Surgeon: Lollie Sails, MD;  Location: [facility] ENDOSCOPY;  Service: Endoscopy;  Laterality: N/A;    Subjective Assessment -    Subjective Pt reports continuing to do well- Reports one near fall outdoors on unlevel surface but able to self recover.    Pertinent History Mathew Aguilar is a 64yoM who comes to [facility] OPPT neuro referred by PCP after acute insidious onset difficulty with walking that began roughly 4 weeks ago upon waking that morning. Pt reports his issue to solely be related to difficulty with  inititation of gait. Pt reports several falls assicated with this new problem. Pt denies any history of prior gait difficult or falls, denies any current or previous changes in strength, sensorium, no history or associated diziness, no LOC, no presyncope. Pt reports only mild improvement since onset. Pt was seen by PCP in office for this issue on 02/24/22 and referred to PT for referral. No imaging has been done, no referral to other provides.             There were no vitals filed for this visit.   TODAY's INTERVENTIONS: 06/02/22  Therex:   Seated knee ext with 3# AW 2 sets of 12 reps Seated hip march with 3# AW 2 sets of 12 reps Sit to stand with 1 foot on airex pad - 10 reps then switch pad under opp LE x 10 reps (patient performed well without UE support)    Neuromuscular re-ed:   STS then immediate large step ambulation, steps over airex, then ambulates with large steps to another chair, transitions to seated then repeats with focus on initiation of large steps from sitting as well as with obstacles, good response with cues.   Forward and retro steps with 3# AW x 30 x 3 rounds feet at a time  Step tap onto 1st step at stairs without railing on airex pad- x 25 reps ea LE  In// bars walking on airex balance beam x 5 laps forward ambulation only  Side stepping on airex balance beam x 5 laps each direction.   Education provided throughout session via VC/TC and demonstration to facilitate movement at target joints and correct muscle activation for all testing and exercises performed.    Rationale for Evaluation and Treatment Rehabilitation       PT Short Term Goals - 03/25/22 1541       PT SHORT TERM GOAL #1   Title Pt to reports decrease of falls at home <1x/month.    Baseline eval: 2-3/week since onset; 05/19/2022= Patient reports only 1 fall in past 3 weeks.    Time 4    Period Weeks    Status ONGOING   Target Date 04/22/22      PT SHORT TERM GOAL #2   Title Pt  to report successful use of SPC for resumption of AMB activity, rpeorts no longer limitng activity due to fear of falling.    Baseline eval: avoiding much wlaking due ot fear of falling; 05/19/2022= Patient reports walking shorter distances without cane and longer distances with cane ex: walking to mailbox.   Time 4    Period Weeks    Status GOAL MET   Target Date 04/22/22               PT Long Term Goals - 03/25/22 1543       PT LONG TERM GOAL #1   Title Pt to show improved FOTO score > 15 points to indicated improved feelings of confidence in balance    Baseline eval: 49; 05/19/2022= 44   Time 8    Period Weeks    Status ONGOING    Target Date 05/20/22      PT LONG TERM GOAL #2   Title Pt to demonstrate 6MWT >1421f c LRAD without any LOB at modI level to indicate ability to safely perform limited community AMB.    Baseline eval: learning SPC use; 05/19/2022= 965 feet with no device- Initial shuffle but vastly improved with distance.   Time 8    Period Weeks    Status ONGOING   Target Date 05/20/22      PT LONG TERM GOAL #3   Title Pt to show BBT score increase > 10 to indicated reduced risk of falls.    Baseline defered to visit 2; 05/19/2022= 44/56   Time 8    Period Weeks    Status ONGOING   Target Date 05/20/22               Plan - 04/16/22 1035     Clinical Impression Statement Continued with current plan of care as laid out in evaluation and recent prior sessions. Pt remains motivated to advance progress toward goals in order to maximize independence and safety at home. Pt requires high level assistance and cuing for completion of exercises in order to provide adequate level of stimulation and perturbation. Author allows pt as much opportunity as possible to perform independent righting strategies, only stepping in when pt is unable to prevent falling to floor. Pt closely monitored throughout session for safe vitals response and to maximize patient safety during  interventions. Pt continues to demonstrate progress toward goals AEB progression of some interventions this date either in volume or intensity.        Examination-Activity Limitations LAdult nurse  Examination-Participation Restrictions Community Activity;Cleaning    Stability/Clinical Decision Making Stable/Uncomplicated    Clinical Decision Making High    Rehab Potential Good    PT Frequency 2x / week    PT Duration 8 weeks    PT Treatment/Interventions DME Instruction;Gait training;Stair training;Functional mobility training;Therapeutic activities;Therapeutic exercise;Balance training;Neuromuscular re-education;Patient/family education    PT Next Visit Plan Review [facility] training and continue balance training.   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[place]) 05/30/2020   Anxiety 05/30/2020   Erectile dysfunction 05/30/2020   History of avoidant personality disorder 05/30/2020   History of dysthymic disorder 05/30/2020   History of suicide attempt 05/30/2020   Hypothyroidism, acquired 05/30/2020   Diet-controlled type 2 diabetes mellitus ([place]) 11/30/2016   Chronic insomnia 08/25/2016   COPD (chronic obstructive pulmonary disease) ([place]) 04/03/2016   Alcohol use disorder, mild, abuse 02/27/2016   Severe recurrent major depression with psychotic features ([place]) 02/27/2016   Suicidal ideation 02/03/2016   Body mass index (BMI) of 32.0-32.9 in adult 01/23/2016   Fatty liver 10/24/2015    Benign prostatic hyperplasia with lower urinary tract symptoms 04/08/2015   GERD (gastroesophageal reflux disease) 02/19/2015   Essential hypertension 02/18/2015   Restless legs syndrome 02/18/2015    [facility]  06/02/2022, 12:50 PM

## 2022-06-04 ENCOUNTER — Ambulatory Visit: Payer: 59 | Admitting: Physical Therapy

## 2022-06-04 ENCOUNTER — Encounter: Payer: Self-pay | Admitting: Physical Therapy

## 2022-06-04 DIAGNOSIS — R269 Unspecified abnormalities of gait and mobility: Secondary | ICD-10-CM

## 2022-06-04 DIAGNOSIS — R296 Repeated falls: Secondary | ICD-10-CM

## 2022-06-04 DIAGNOSIS — R262 Difficulty in walking, not elsewhere classified: Secondary | ICD-10-CM | POA: Diagnosis not present

## 2022-06-04 DIAGNOSIS — R2681 Unsteadiness on feet: Secondary | ICD-10-CM

## 2022-06-04 NOTE — Therapy (Signed)
[place] MAIN [facility] SERVICES [address] [place], [place], [address] Phone: [phone]   Fax:  [phone]  Physical Therapy Treatment  Patient Details  Name: Mathew Aguilar MRN: [id number] Date of Birth: 05/06/1951 Referring Provider (PT): Dr. Harrel Lemon (PCP)   Encounter Date: 06/04/2022   PT End of Session - 06/04/22 1152     Visit Number 15    Number of Visits 16    Date for PT Re-Evaluation 06/17/22    Authorization Type UHC Medicare, Medicaid secondary    Authorization Time Period 03/25/22-06/17/22    Progress Note Due on Visit 20    PT Start Time 1149    PT Stop Time 1228    PT Time Calculation (min) 39 min    Equipment Utilized During Treatment Gait belt    Activity Tolerance Patient tolerated treatment well;No increased pain    Behavior During Therapy WFL for tasks assessed/performed                 Past Medical History:  Diagnosis Date   Anxiety    Asthma    Borderline personality disorder ([place])    Depression    Dysrhythmia    Erectile dysfunction    Fatty liver    GERD (gastroesophageal reflux disease)    Hypertension    Hypothyroidism    Pulmonary nodule, right    Restless leg    Sleep apnea    Suicide attempt [facility])     Past Surgical History:  Procedure Laterality Date   AMPUTATION ARM     COLONOSCOPY     COLONOSCOPY WITH PROPOFOL N/A 08/22/2018   Procedure: COLONOSCOPY WITH PROPOFOL;  Surgeon: Lollie Sails, MD;  Location: [facility] ENDOSCOPY;  Service: Endoscopy;  Laterality: N/A;    Subjective Assessment -    Subjective Pt reports continuing to do well- no LOB or fall since last visit. Pt reports having some kind of leg test at [facility] ( sounds like nerve conduction to Mathew Aguilar) a couple days ago but is still awaiting results.    Pertinent History [name] is a 67yoM who comes to [facility] OPPT neuro referred by PCP after acute insidious onset difficulty with walking that began roughly 4 weeks ago  upon waking that morning. Pt reports his issue to solely be related to difficulty with inititation of gait. Pt reports several falls assicated with this new problem. Pt denies any history of prior gait difficult or falls, denies any current or previous changes in strength, sensorium, no history or associated diziness, no LOC, no presyncope. Pt reports only mild improvement since onset. Pt was seen by PCP in office for this issue on 02/24/22 and referred to PT for referral. No imaging has been done, no referral to other provides.             There were no vitals filed for this visit.   TODAY's INTERVENTIONS: 06/04/22  Therex:   Seated knee ext with 4# AW 2 sets of 12 reps Seated hip march with 4# AW 2 sets of 12 reps Sit to stand with feet on airex pad x 10  - added weighted ball ( 1KG) x 10 reps for increased challenge   Neuromuscular re-ed:   STS then immediate large step ambulation, steps over airex, then ambulates with large steps to another chair, transitions to seated then repeats with focus on initiation of large steps from sitting as well as with obstacles, good response with cues.  -x 12 times  with 2  AW donned, no instances of shuffling this date with focus on this each round   Forward steps in ladder x 4 then 1 retro step x 3 through ladder,  - completes 10 reps total  -cues for step count and for large enough initial steps to avoid ladder contact, good response with practice and good balance throughout.   Step tap onto step  at stairs without railing with 2# AW donned 2 *10 ea LE   In// bars walking on airex balance beam x 5 laps forward ambulation only  Side stepping on airex balance beam x 5 laps each direction.   Education provided throughout session via VC/TC and demonstration to facilitate movement at target joints and correct muscle activation for all testing and exercises performed.    Rationale for Evaluation and Treatment Rehabilitation       PT Short  Term Goals - 03/25/22 1541       PT SHORT TERM GOAL #1   Title Pt to reports decrease of falls at home <1x/month.    Baseline eval: 2-3/week since onset; 05/19/2022= Patient reports only 1 fall in past 3 weeks.    Time 4    Period Weeks    Status ONGOING   Target Date 04/22/22      PT SHORT TERM GOAL #2   Title Pt to report successful use of SPC for resumption of AMB activity, rpeorts no longer limitng activity due to fear of falling.    Baseline eval: avoiding much wlaking due ot fear of falling; 05/19/2022= Patient reports walking shorter distances without cane and longer distances with cane ex: walking to mailbox.   Time 4    Period Weeks    Status GOAL MET   Target Date 04/22/22               PT Long Term Goals - 03/25/22 1543       PT LONG TERM GOAL #1   Title Pt to show improved FOTO score > 15 points to indicated improved feelings of confidence in balance    Baseline eval: 49; 05/19/2022= 44   Time 8    Period Weeks    Status ONGOING    Target Date 05/20/22      PT LONG TERM GOAL #2   Title Pt to demonstrate 6MWT >1491f c LRAD without any LOB at modI level to indicate ability to safely perform limited community AMB.    Baseline eval: learning SPC use; 05/19/2022= 965 feet with no device- Initial shuffle but vastly improved with distance.   Time 8    Period Weeks    Status ONGOING   Target Date 05/20/22      PT LONG TERM GOAL #3   Title Pt to show BBT score increase > 10 to indicated reduced risk of falls.    Baseline defered to visit 2; 05/19/2022= 44/56   Time 8    Period Weeks    Status ONGOING   Target Date 05/20/22               Plan - 04/16/22 1035     Clinical Impression Statement Continued with current plan of care as laid out in evaluation and recent prior sessions. Pt remains motivated to advance progress toward goals in order to maximize independence and safety at home. Pt requires high level assistance and cuing for completion of exercises in  order to provide adequate level of stimulation and perturbation. Author allows pt as much opportunity as  possible to perform independent righting strategies, only stepping in when pt is unable to prevent falling to floor. Pt closely monitored throughout session for safe vitals response and to maximize patient safety during interventions. Pt continues to demonstrate progress toward goals AEB progression of some interventions this date either in volume or intensity.        Examination-Activity Limitations Locomotion Level;Carry    Examination-Participation Restrictions Community Activity;Cleaning    Stability/Clinical Decision Making Stable/Uncomplicated    Clinical Decision Making High    Rehab Potential Good    PT Frequency 2x / week    PT Duration 8 weeks    PT Treatment/Interventions DME Instruction;Gait training;Stair training;Functional mobility training;Therapeutic activities;Therapeutic exercise;Balance training;Neuromuscular re-education;Patient/family education    PT Next Visit Plan Review [facility] training and continue balance training.   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[place]) 05/30/2020   Anxiety 05/30/2020   Erectile dysfunction 05/30/2020   History of avoidant personality disorder 05/30/2020   History of dysthymic disorder 05/30/2020   History of suicide attempt 05/30/2020   Hypothyroidism, acquired 05/30/2020   Diet-controlled type 2 diabetes mellitus ([place]) 11/30/2016   Chronic  insomnia 08/25/2016   COPD (chronic obstructive pulmonary disease) ([place]) 04/03/2016   Alcohol use disorder, mild, abuse 02/27/2016   Severe recurrent major depression with psychotic features ([place]) 02/27/2016   Suicidal ideation 02/03/2016   Body mass index (BMI) of 32.0-32.9 in adult 01/23/2016   Fatty liver 10/24/2015   Benign prostatic hyperplasia with lower urinary tract symptoms 04/08/2015   GERD (gastroesophageal reflux disease) 02/19/2015   Essential hypertension 02/18/2015   Restless legs syndrome 02/18/2015    [facility]  06/04/2022, 1:02 PM

## 2022-06-09 ENCOUNTER — Ambulatory Visit: Payer: 59

## 2022-06-09 DIAGNOSIS — R262 Difficulty in walking, not elsewhere classified: Secondary | ICD-10-CM

## 2022-06-09 DIAGNOSIS — R269 Unspecified abnormalities of gait and mobility: Secondary | ICD-10-CM

## 2022-06-09 DIAGNOSIS — R296 Repeated falls: Secondary | ICD-10-CM

## 2022-06-09 DIAGNOSIS — R2681 Unsteadiness on feet: Secondary | ICD-10-CM

## 2022-06-09 NOTE — Therapy (Signed)
[place] MAIN [facility] SERVICES [address] [place], [place], [address] Phone: [phone]   Fax:  [phone]  Physical Therapy Treatment  Patient Details  Name: Mathew Aguilar MRN: [id number] Date of Birth: May 03, 1951 Referring Provider (PT): Dr. Harrel Lemon (PCP)   Encounter Date: 06/09/2022   PT End of Session - 06/09/22 0806     Visit Number 16    Number of Visits 16    Date for PT Re-Evaluation 06/17/22    Authorization Type UHC Medicare, Medicaid secondary    Authorization Time Period 03/25/22-06/17/22    Progress Note Due on Visit 20    PT Start Time 0755    PT Stop Time 0840    PT Time Calculation (min) 45 min    Equipment Utilized During Treatment Gait belt    Activity Tolerance Patient tolerated treatment well;No increased pain    Behavior During Therapy WFL for tasks assessed/performed                 Past Medical History:  Diagnosis Date   Anxiety    Asthma    Borderline personality disorder ([place])    Depression    Dysrhythmia    Erectile dysfunction    Fatty liver    GERD (gastroesophageal reflux disease)    Hypertension    Hypothyroidism    Pulmonary nodule, right    Restless leg    Sleep apnea    Suicide attempt [facility])     Past Surgical History:  Procedure Laterality Date   AMPUTATION ARM     COLONOSCOPY     COLONOSCOPY WITH PROPOFOL N/A 08/22/2018   Procedure: COLONOSCOPY WITH PROPOFOL;  Surgeon: Lollie Sails, MD;  Location: [facility] ENDOSCOPY;  Service: Endoscopy;  Laterality: N/A;    Subjective Assessment -    Subjective Pt reports no results yet from any testing. States doing well overall.    Pertinent History Mathew Aguilar is a 30yoM who comes to [facility] OPPT neuro referred by PCP after acute insidious onset difficulty with walking that began roughly 4 weeks ago upon waking that morning. Pt reports his issue to solely be related to difficulty with inititation of gait. Pt reports several  falls assicated with this new problem. Pt denies any history of prior gait difficult or falls, denies any current or previous changes in strength, sensorium, no history or associated diziness, no LOC, no presyncope. Pt reports only mild improvement since onset. Pt was seen by PCP in office for this issue on 02/24/22 and referred to PT for referral. No imaging has been done, no referral to other provides.             There were no vitals filed for this visit.   TODAY's INTERVENTIONS: 06/09/22  Reassessed several goals: See goal section for details     Neuromuscular re-ed:    In// bars - Tandem standing - attempting to hold 10-20 sec x multiple attempts each Side. Patient presents with increased difficulty- reaching for UE support  Tandem walking in // bars-down and back x4 rounds (did improve to only occasional UE support on last trial) Single leg standing in // bars - attempting to hold 3-5 sec. Patient with increased difficulty - specifically on left side today.      Education provided throughout session via VC/TC and demonstration to facilitate movement at target joints and correct muscle activation for all testing and exercises performed.    Rationale for Evaluation and Treatment Rehabilitation  HOME EXERCISE  PROGRAM:   Access Code: [email or web address] URL: [email or web address] Date: 06/09/2022 Prepared by: Sande Brothers  Exercises - Single Leg Stance  - 1 x daily - 7 x weekly - 5 sets - 5-10 sec hold - Tandem Stance  - 1 x daily - 7 x weekly - 5 sets - 5-10 sec hold    PT Short Term Goals - 03/25/22 1541       PT SHORT TERM GOAL #1   Title Pt to reports decrease of falls at home <1x/month.    Baseline eval: 2-3/week since onset; 05/19/2022= Patient reports only 1 fall in past 3 weeks. 06/09/2022= Patient denies any falls.    Time 4    Period Weeks    Status  MET   Target Date 04/22/22      PT SHORT TERM GOAL #2   Title Pt to report successful use of  SPC for resumption of AMB activity, rpeorts no longer limitng activity due to fear of falling.    Baseline eval: avoiding much wlaking due ot fear of falling; 05/19/2022= Patient reports walking shorter distances without cane and longer distances with cane ex: walking to mailbox.    Time 4    Period Weeks    Status GOAL MET   Target Date 04/22/22               PT Long Term Goals - 03/25/22 1543       PT LONG TERM GOAL #1   Title Pt to show improved FOTO score > 15 points to indicated improved feelings of confidence in balance    Baseline eval: 49; 05/19/2022= 44   Time 8    Period Weeks    Status ONGOING    Target Date 05/20/22      PT LONG TERM GOAL #2   Title Pt to demonstrate 6MWT >14103f c LRAD without any LOB at modI level to indicate ability to safely perform limited community AMB.    Baseline eval: learning SPC use; 05/19/2022= 965 feet with no device- Initial shuffle but vastly improved with distance. 06/09/2022= 1130 feet without an assistive device.   Time 8    Period Weeks    Status ONGOING   Target Date 05/20/22      PT LONG TERM GOAL #3   Title Pt to show BBT score increase > 10 to indicated reduced risk of falls.    Baseline defered to visit 2; 05/19/2022= 44/56; 06/09/2022= 46/56   Time 8    Period Weeks    Status ONGOING   Target Date 05/20/22               Plan - 04/16/22 1035     Clinical Impression Statement Continued with current plan of care and reassessed balance and 6 min walk today. Patient continues to demonstrate progress with improvement in BERG and 6 min walk test today He did present with increased difficulty with tandem and SLS activities and these were added to his HEP for increased focus.  Based on his BERG Score- he continues to present with increased risk of falling. Patient will continue to benefit from skilled PT services to improve his functional mobility and decrease his risk of falling.       Examination-Activity Limitations Locomotion  Level;Carry    Examination-Participation Restrictions Community Activity;Cleaning    Stability/Clinical Decision Making Stable/Uncomplicated    Clinical Decision Making High    Rehab Potential Good    PT Frequency 2x / week    PT Duration  8 weeks    PT Treatment/Interventions DME Instruction;Gait training;Stair training;Functional mobility training;Therapeutic activities;Therapeutic exercise;Balance training;Neuromuscular re-education;Patient/family education    PT Next Visit Plan Review [facility] training and continue balance training.   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[place]) 05/30/2020   Anxiety 05/30/2020   Erectile dysfunction 05/30/2020   History of avoidant personality disorder 05/30/2020   History of dysthymic disorder 05/30/2020   History of suicide attempt 05/30/2020   Hypothyroidism, acquired 05/30/2020   Diet-controlled type 2 diabetes mellitus ([place]) 11/30/2016   Chronic insomnia 08/25/2016   COPD (chronic obstructive pulmonary disease) ([place]) 04/03/2016   Alcohol use disorder, mild, abuse 02/27/2016   Severe recurrent major depression with psychotic features ([place]) 02/27/2016   Suicidal ideation 02/03/2016   Body mass index (BMI) of 32.0-32.9 in adult 01/23/2016   Fatty liver 10/24/2015   Benign prostatic hyperplasia with lower urinary tract symptoms 04/08/2015   GERD (gastroesophageal reflux disease) 02/19/2015   Essential hypertension 02/18/2015   Restless legs syndrome  02/18/2015    [facility]  06/09/2022, 10:22 PM

## 2022-06-11 ENCOUNTER — Encounter: Payer: Self-pay | Admitting: Physical Therapy

## 2022-06-11 ENCOUNTER — Ambulatory Visit: Payer: 59 | Admitting: Physical Therapy

## 2022-06-11 DIAGNOSIS — R2681 Unsteadiness on feet: Secondary | ICD-10-CM

## 2022-06-11 DIAGNOSIS — R296 Repeated falls: Secondary | ICD-10-CM

## 2022-06-11 DIAGNOSIS — R262 Difficulty in walking, not elsewhere classified: Secondary | ICD-10-CM

## 2022-06-11 DIAGNOSIS — R269 Unspecified abnormalities of gait and mobility: Secondary | ICD-10-CM

## 2022-06-11 NOTE — Therapy (Signed)
[place] MAIN [facility] SERVICES [address] [place], [place], [address] Phone: [phone]   Fax:  [phone]  Physical Therapy Treatment  Patient Details  Name: Mathew Aguilar MRN: [id number] Date of Birth: 01/15/1951 Referring Provider (PT): Dr. Harrel Lemon (PCP)   Encounter Date: 06/11/2022   PT End of Session - 06/11/22 1348     Visit Number 17    Number of Visits 24    Date for PT Re-Evaluation 06/17/22    Authorization Type UHC Medicare, Medicaid secondary    Authorization Time Period 03/25/22-06/17/22    Progress Note Due on Visit 20    PT Start Time 1141    PT Stop Time 1223    PT Time Calculation (min) 42 min    Equipment Utilized During Treatment Gait belt    Activity Tolerance Patient tolerated treatment well;No increased pain    Behavior During Therapy WFL for tasks assessed/performed                  Past Medical History:  Diagnosis Date   Anxiety    Asthma    Borderline personality disorder ([place])    Depression    Dysrhythmia    Erectile dysfunction    Fatty liver    GERD (gastroesophageal reflux disease)    Hypertension    Hypothyroidism    Pulmonary nodule, right    Restless leg    Sleep apnea    Suicide attempt [facility])     Past Surgical History:  Procedure Laterality Date   AMPUTATION ARM     COLONOSCOPY     COLONOSCOPY WITH PROPOFOL N/A 08/22/2018   Procedure: COLONOSCOPY WITH PROPOFOL;  Surgeon: Lollie Sails, MD;  Location: [facility] ENDOSCOPY;  Service: Endoscopy;  Laterality: N/A;    Subjective Assessment -    Subjective Pt reports no results yet from any testing. States doing well overall.    Pertinent History Mathew Aguilar is a 32yoM who comes to [facility] OPPT neuro referred by PCP after acute insidious onset difficulty with walking that began roughly 4 weeks ago upon waking that morning. Pt reports his issue to solely be related to difficulty with inititation of gait. Pt reports several  falls assicated with this new problem. Pt denies any history of prior gait difficult or falls, denies any current or previous changes in strength, sensorium, no history or associated diziness, no LOC, no presyncope. Pt reports only mild improvement since onset. Pt was seen by PCP in office for this issue on 02/24/22 and referred to PT for referral. No imaging has been done, no referral to other provides.             There were no vitals filed for this visit.   TODAY's INTERVENTIONS: 06/11/22  Reassessed several goals: See goal section for details   TE Ambulation x 1100 feet without AD     5XSTS:19.02 sec - added as goal to address pt complaint of ipmrired LE strength.   Neuromuscular re-ed:   Tandem stance on airex balance beam 3 x 45 sec ea LE   Tandem walking with UE support x 5 laps   Tandem walking no UE support x 5 laps   BOSU stance 3 x 1 min on soft side   BOSU step ups with UE support x 6 to ea side with cues for proper foot placement.   Education provided throughout session via VC/TC and demonstration to facilitate movement at target joints and correct muscle activation for  all testing and exercises performed.    Rationale for Evaluation and Treatment Rehabilitation  HOME EXERCISE PROGRAM:   Access Code: [email or web address] URL: [email or web address] Date: 06/09/2022 Prepared by: Sande Brothers  Exercises - Single Leg Stance  - 1 x daily - 7 x weekly - 5 sets - 5-10 sec hold - Tandem Stance  - 1 x daily - 7 x weekly - 5 sets - 5-10 sec hold    PT Short Term Goals - 03/25/22 1541       PT SHORT TERM GOAL #1   Title Pt to reports decrease of falls at home <1x/month.    Baseline eval: 2-3/week since onset; 05/19/2022= Patient reports only 1 fall in past 3 weeks. 06/09/2022= Patient denies any falls.    Time 4    Period Weeks    Status  MET   Target Date 04/22/22      PT SHORT TERM GOAL #2   Title Pt to report successful use of SPC for resumption  of AMB activity, rpeorts no longer limitng activity due to fear of falling.    Baseline eval: avoiding much wlaking due ot fear of falling; 05/19/2022= Patient reports walking shorter distances without cane and longer distances with cane ex: walking to mailbox.    Time 4    Period Weeks    Status GOAL MET   Target Date 04/22/22               PT Long Term Goals - 03/25/22 1543       PT LONG TERM GOAL #1   Title Pt to show improved FOTO score > 15 points to indicated improved feelings of confidence in balance    Baseline eval: 49; 05/19/2022= 44   Time 8    Period Weeks    Status ONGOING    Target Date 05/20/22      PT LONG TERM GOAL #2   Title Pt to demonstrate 6MWT >1446f c LRAD without any LOB at modI level to indicate ability to safely perform limited community AMB.    Baseline eval: learning SPC use; 05/19/2022= 965 feet with no device- Initial shuffle but vastly improved with distance. 06/09/2022= 1130 feet without an assistive device.   Time 8    Period Weeks    Status ONGOING   Target Date 05/20/22      PT LONG TERM GOAL #3   Title Pt to show BBT score increase > 10 to indicated reduced risk of falls.    Baseline defered to visit 2; 05/19/2022= 44/56; 06/09/2022= 46/56   Time 8    Period Weeks    Status ONGOING   Target Date 05/20/22            PT LONG TERM GOAL #4  Title Patient will improve 5 times sit to stand test of less than 15 seconds to indicate improved lower extremity power and decrease risk of falls.    Baseline 9/28: 9018.02 seconds  Time 8   Period Weeks   Status ONGOING  Target Date 07/09/2022        Plan - 04/16/22 1035     Clinical Impression Statement Continued with current plan of care and reassessed balance and 6 min walk today.  Patient tested for 5 times sit to stand in order to address patient's expression of limitation lower extremity strength.  Goal added as result of patient's test result from this.  Physical therapy continued with  lower extremity endurance and balance activities in order to prevent  falls. Pt will continue to benefit from skilled physical therapy intervention to address impairments, improve QOL, and attain therapy goals.      Examination-Activity Limitations Locomotion Level;Carry    Examination-Participation Restrictions Community Activity;Cleaning    Stability/Clinical Decision Making Stable/Uncomplicated    Clinical Decision Making High    Rehab Potential Good    PT Frequency 2x / week    PT Duration 8 weeks    PT Treatment/Interventions DME Instruction;Gait training;Stair training;Functional mobility training;Therapeutic activities;Therapeutic exercise;Balance training;Neuromuscular re-education;Patient/family education    PT Next Visit Plan Review [facility] training and continue balance training.   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[place]) 05/30/2020   Anxiety 05/30/2020   Erectile dysfunction 05/30/2020   History of avoidant personality disorder 05/30/2020   History of dysthymic disorder 05/30/2020   History of suicide attempt 05/30/2020   Hypothyroidism, acquired 05/30/2020   Diet-controlled type 2 diabetes mellitus ([place]) 11/30/2016   Chronic insomnia 08/25/2016   COPD (chronic obstructive pulmonary disease) ([place]) 04/03/2016   Alcohol use disorder, mild, abuse 02/27/2016   Severe recurrent major depression with psychotic features ([place]) 02/27/2016   Suicidal ideation 02/03/2016   Body mass index  (BMI) of 32.0-32.9 in adult 01/23/2016   Fatty liver 10/24/2015   Benign prostatic hyperplasia with lower urinary tract symptoms 04/08/2015   GERD (gastroesophageal reflux disease) 02/19/2015   Essential hypertension 02/18/2015   Restless legs syndrome 02/18/2015    [facility]  06/11/2022, 1:49 PM

## 2022-06-15 NOTE — Therapy (Signed)
[place] MAIN [facility] SERVICES [address] [place], [place], [address] Phone: [phone]   Fax:  [phone]  Physical Therapy Treatment/Recertification  Patient Details  Name: Mathew Aguilar MRN: [id number] Date of Birth: Aug 26, 1951 Referring Provider (PT): Dr. Harrel Lemon (PCP)   Encounter Date: 06/16/2022   PT End of Session - 06/16/22 0806     Visit Number 18    Number of Visits 42    Date for PT Re-Evaluation 09/08/22    Authorization Type UHC Medicare, Medicaid secondary    Authorization Time Period 03/25/22-06/17/22;    Progress Note Due on Visit 20    PT Start Time 0800    PT Stop Time 0839    PT Time Calculation (min) 39 min    Equipment Utilized During Treatment Gait belt    Activity Tolerance Patient tolerated treatment well;No increased pain    Behavior During Therapy WFL for tasks assessed/performed                   Past Medical History:  Diagnosis Date   Anxiety    Asthma    Borderline personality disorder ([place])    Depression    Dysrhythmia    Erectile dysfunction    Fatty liver    GERD (gastroesophageal reflux disease)    Hypertension    Hypothyroidism    Pulmonary nodule, right    Restless leg    Sleep apnea    Suicide attempt [facility])     Past Surgical History:  Procedure Laterality Date   AMPUTATION ARM     COLONOSCOPY     COLONOSCOPY WITH PROPOFOL N/A 08/22/2018   Procedure: COLONOSCOPY WITH PROPOFOL;  Surgeon: Lollie Sails, MD;  Location: [facility] ENDOSCOPY;  Service: Endoscopy;  Laterality: N/A;    Subjective Assessment -    Subjective Pt reports no results yet from any testing. States doing well overall.    Pertinent History Harding Thomure is a 35yoM who comes to [facility] OPPT neuro referred by PCP after acute insidious onset difficulty with walking that began roughly 4 weeks ago upon waking that morning. Pt reports his issue to solely be related to difficulty with inititation of gait. Pt  reports several falls assicated with this new problem. Pt denies any history of prior gait difficult or falls, denies any current or previous changes in strength, sensorium, no history or associated diziness, no LOC, no presyncope. Pt reports only mild improvement since onset. Pt was seen by PCP in office for this issue on 02/24/22 and referred to PT for referral. No imaging has been done, no referral to other provides.             There were no vitals filed for this visit.   TODAY's INTERVENTIONS: 06/16/22  Reassessed goals for recert visit today: See goal section for details    Education provided throughout session via VC/TC and demonstration to facilitate movement at target joints and correct muscle activation for all testing and exercises performed.    Rationale for Evaluation and Treatment Rehabilitation  HOME EXERCISE PROGRAM:   Access Code: [email or web address] URL: [email or web address] Date: 06/09/2022 Prepared by: Sande Brothers  Exercises - Single Leg Stance  - 1 x daily - 7 x weekly - 5 sets - 5-10 sec hold - Tandem Stance  - 1 x daily - 7 x weekly - 5 sets - 5-10 sec hold    PT Short Term Goals - 03/25/22 1541       PT SHORT  TERM GOAL #1   Title Pt to reports decrease of falls at home <1x/month.    Baseline eval: 2-3/week since onset; 05/19/2022= Patient reports only 1 fall in past 3 weeks. 06/09/2022= Patient denies any falls.    Time 4    Period Weeks    Status  MET   Target Date 04/22/22      PT SHORT TERM GOAL #2   Title Pt to report successful use of SPC for resumption of AMB activity, rpeorts no longer limitng activity due to fear of falling.    Baseline eval: avoiding much wlaking due ot fear of falling; 05/19/2022= Patient reports walking shorter distances without cane and longer distances with cane ex: walking to mailbox.    Time 4    Period Weeks    Status GOAL MET   Target Date 04/22/22               PT Long Term Goals - 03/25/22  1543       PT LONG TERM GOAL #1   Title Pt to show improved FOTO score > 15 points to indicated improved feelings of confidence in balance    Baseline eval: 49; 05/19/2022= 44; 06/16/2022= 58%   Time 12   Period Weeks    Status ONGOING    Target Date 09/08/2022     PT LONG TERM GOAL #2   Title Pt to demonstrate 6MWT >1443f c LRAD without any LOB at modI level to indicate ability to safely perform limited community AMB.    Baseline eval: learning SPC use; 05/19/2022= 965 feet with no device- Initial shuffle but vastly improved with distance. 06/09/2022= 1130 feet without an assistive device. 06/15/2022= 1135 feet  without an assistive device.   Time 12   Period Weeks    Status ONGOING   Target Date 09/08/2022     PT LONG TERM GOAL #3   Title Pt to show Berg balance test score increase > 10 points to indicate reduced risk of falls.    Baseline defered to visit 2; 05/19/2022= 44/56; 06/09/2022= 46/56; 06/15/2022= 47/56   Time 12   Period Weeks    Status ONGOING   Target Date 09/08/2022           PT LONG TERM GOAL #4  Title Patient will improve 5 times sit to stand test of less than 15 seconds to indicate improved lower extremity power and decrease risk of falls.    Baseline 9/28: 19.02 seconds; 06/16/2022= 19.08 sec without UE support  Time 12  Period Weeks   Status ONGOING  Target Date 09/08/2022        Plan - 04/16/22 1035     Clinical Impression Statement Patient presented with good motivation for today's recert visit. He performed well with all testing. He scored significantly higher on FOTO today indicating improved self perceived abilities. A LE strength goal was added last visit and patient scored approx the same today. He did demonstrate improvement in functional endurance as seen by improved 6 min walk test. He also exhibited improved dynamic balance scoring a 47/56. Despite these improvement he continues to present at increased risk of falling and He will continue to benefit  from skilled physical therapy intervention to address impairments, improve QOL, and attain therapy goals. Patient's condition has the potential to improve in response to therapy. Maximum improvement is yet to be obtained. The anticipated improvement is attainable and reasonable in a generally predictable time.      Examination-Activity Limitations Locomotion  Level;Carry    Examination-Participation Restrictions Community Activity;Cleaning    Stability/Clinical Decision Making Stable/Uncomplicated    Clinical Decision Making High    Rehab Potential Good    PT Frequency 2x / week    PT Duration 8 weeks    PT Treatment/Interventions DME Instruction;Gait training;Stair training;Functional mobility training;Therapeutic activities;Therapeutic exercise;Balance training;Neuromuscular re-education;Patient/family education    PT Next Visit Plan Review [facility] training and continue balance training.   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[place]) 05/30/2020   Anxiety 05/30/2020   Erectile dysfunction 05/30/2020   History of avoidant personality disorder 05/30/2020   History of dysthymic disorder 05/30/2020   History of suicide attempt 05/30/2020   Hypothyroidism, acquired 05/30/2020   Diet-controlled type 2 diabetes mellitus ([place]) 11/30/2016   Chronic insomnia 08/25/2016   COPD (chronic obstructive pulmonary disease) ([place]) 04/03/2016   Alcohol use disorder, mild, abuse 02/27/2016   Severe recurrent major  depression with psychotic features ([place]) 02/27/2016   Suicidal ideation 02/03/2016   Body mass index (BMI) of 32.0-32.9 in adult 01/23/2016   Fatty liver 10/24/2015   Benign prostatic hyperplasia with lower urinary tract symptoms 04/08/2015   GERD (gastroesophageal reflux disease) 02/19/2015   Essential hypertension 02/18/2015   Restless legs syndrome 02/18/2015    [facility]  06/16/2022, 11:42 AM

## 2022-06-16 ENCOUNTER — Ambulatory Visit: Payer: 59 | Attending: Internal Medicine

## 2022-06-16 DIAGNOSIS — R262 Difficulty in walking, not elsewhere classified: Secondary | ICD-10-CM | POA: Diagnosis not present

## 2022-06-16 DIAGNOSIS — R296 Repeated falls: Secondary | ICD-10-CM | POA: Diagnosis present

## 2022-06-16 DIAGNOSIS — R269 Unspecified abnormalities of gait and mobility: Secondary | ICD-10-CM | POA: Insufficient documentation

## 2022-06-16 DIAGNOSIS — R2681 Unsteadiness on feet: Secondary | ICD-10-CM | POA: Insufficient documentation

## 2022-06-18 ENCOUNTER — Ambulatory Visit: Payer: 59 | Admitting: Physical Therapy

## 2022-06-18 DIAGNOSIS — R269 Unspecified abnormalities of gait and mobility: Secondary | ICD-10-CM

## 2022-06-18 DIAGNOSIS — R262 Difficulty in walking, not elsewhere classified: Secondary | ICD-10-CM | POA: Diagnosis not present

## 2022-06-18 DIAGNOSIS — R2681 Unsteadiness on feet: Secondary | ICD-10-CM

## 2022-06-18 DIAGNOSIS — R296 Repeated falls: Secondary | ICD-10-CM

## 2022-06-18 NOTE — Therapy (Signed)
[place] MAIN [facility] SERVICES [address] [place], [place], [address] Phone: [phone]   Fax:  [phone]  Physical Therapy Treatment  Patient Details  Name: Mathew Aguilar MRN: [id number] Date of Birth: [birth year] Referring Provider (PT): Dr. Harrel Lemon (PCP)   Encounter Date: 06/18/2022   PT End of Session - 06/18/22 1130     Visit Number 19    Number of Visits 42    Date for PT Re-Evaluation 09/08/22    Authorization Type UHC Medicare, Medicaid secondary    Authorization Time Period 03/25/22-06/17/22;    Progress Note Due on Visit 20    PT Start Time 1145    PT Stop Time 1225    PT Time Calculation (min) 40 min    Equipment Utilized During Treatment Gait belt    Activity Tolerance Patient tolerated treatment well;No increased pain    Behavior During Therapy WFL for tasks assessed/performed                    Past Medical History:  Diagnosis Date   Anxiety    Asthma    Borderline personality disorder ([place])    Depression    Dysrhythmia    Erectile dysfunction    Fatty liver    GERD (gastroesophageal reflux disease)    Hypertension    Hypothyroidism    Pulmonary nodule, right    Restless leg    Sleep apnea    Suicide attempt [facility])     Past Surgical History:  Procedure Laterality Date   AMPUTATION ARM     COLONOSCOPY     COLONOSCOPY WITH PROPOFOL N/A 08/22/2018   Procedure: COLONOSCOPY WITH PROPOFOL;  Surgeon: Mathew Sails, MD;  Location: [facility] ENDOSCOPY;  Service: Endoscopy;  Laterality: N/A;    Subjective Assessment -    Subjective Pt reports no falls since last visit. Pt report no new findings since last visit.    Pertinent History Mathew Aguilar is a 13yoM who comes to [facility] OPPT neuro referred by PCP after acute insidious onset difficulty with walking that began roughly 4 weeks ago upon waking that morning. Pt reports his issue to solely be related to difficulty with inititation of gait. Pt  reports several falls assicated with this new problem. Pt denies any history of prior gait difficult or falls, denies any current or previous changes in strength, sensorium, no history or associated diziness, no LOC, no presyncope. Pt reports only mild improvement since onset. Pt was seen by PCP in office for this issue on 02/24/22 and referred to PT for referral. No imaging has been done, no referral to other provides.             There were no vitals filed for this visit.   TODAY's INTERVENTIONS: 06/18/22 Pt initially presents with significant shuffling gait pattern when ambulating from waiting to treatment area   TE  STS with 20 feet ambulation focus on large steps and overcoming tendency to shuffle completes 10 repetitions with good response.   Neuromuscular re-ed:   Tandem stance on airex balance beam 3 x 45 sec ea LE, with R LE posterior one significant LOB with inability to competing stepping balance reaction, max A from PT to prevent fall to floor.   Step training to prevent shuffling. Stepping to numbers on box. Pt unable to prevent shuffling gait with stepping to different objects. With PT blocking foot on step pt able to complete without shuffling step but pt unable to  integrate independently despite explanation, verbal and tactile cues.   Bosu step and weight shift x 15 ant ( bilateral) and laterally -manual assistance with preventing shuffling pattern of walking   Bosu stance on hard side 2 x 60 seconds.   Education provided throughout session via VC/TC and demonstration to facilitate movement at target joints and correct muscle activation for all testing and exercises performed.    Rationale for Evaluation and Treatment Rehabilitation   Education provided throughout session via VC/TC and demonstration to facilitate movement at target joints and correct muscle activation for all testing and exercises performed.    Rationale for Evaluation and Treatment  Rehabilitation  HOME EXERCISE PROGRAM:   Access Code: [email or web address] URL: [email or web address] Date: 06/09/2022 Prepared by: Mathew Aguilar  Exercises - Single Leg Stance  - 1 x daily - 7 x weekly - 5 sets - 5-10 sec hold - Tandem Stance  - 1 x daily - 7 x weekly - 5 sets - 5-10 sec hold    PT Short Term Goals - 03/25/22 1541       PT SHORT TERM GOAL #1   Title Pt to reports decrease of falls at home <1x/month.    Baseline eval: 2-3/week since onset; 05/19/2022= Patient reports only 1 fall in past 3 weeks. 06/09/2022= Patient denies any falls.    Time 4    Period Weeks    Status  MET   Target Date 04/22/22      PT SHORT TERM GOAL #2   Title Pt to report successful use of SPC for resumption of AMB activity, rpeorts no longer limitng activity due to fear of falling.    Baseline eval: avoiding much wlaking due ot fear of falling; 05/19/2022= Patient reports walking shorter distances without cane and longer distances with cane ex: walking to mailbox.    Time 4    Period Weeks    Status GOAL MET   Target Date 04/22/22               PT Long Term Goals - 03/25/22 1543       PT LONG TERM GOAL #1   Title Pt to show improved FOTO score > 15 points to indicated improved feelings of confidence in balance    Baseline eval: 49; 05/19/2022= 44; 06/16/2022= 58%   Time 12   Period Weeks    Status ONGOING    Target Date 09/08/2022     PT LONG TERM GOAL #2   Title Pt to demonstrate 6MWT >1430f c LRAD without any LOB at modI level to indicate ability to safely perform limited community AMB.    Baseline eval: learning SPC use; 05/19/2022= 965 feet with no device- Initial shuffle but vastly improved with distance. 06/09/2022= 1130 feet without an assistive device. 06/15/2022= 1135 feet  without an assistive device.   Time 12   Period Weeks    Status ONGOING   Target Date 09/08/2022     PT LONG TERM GOAL #3   Title Pt to show Berg balance test score increase > 10 points to  indicate reduced risk of falls.    Baseline defered to visit 2; 05/19/2022= 44/56; 06/09/2022= 46/56; 06/15/2022= 47/56   Time 12   Period Weeks    Status ONGOING   Target Date 09/08/2022           PT LONG TERM GOAL #4  Title Patient will improve 5 times sit to stand test of less than 15 seconds to indicate improved lower extremity  power and decrease risk of falls.    Baseline 9/28: 19.02 seconds; 06/16/2022= 19.08 sec without UE support  Time 12  Period Weeks   Status ONGOING  Target Date 09/08/2022        Plan - 04/16/22 1035     Clinical Impression Statement Patient demonstrates good motivation for completion of physical therapy activities.  Patient presents with significant shuffling gait pattern resulting in a few losses of balance.  Patient continues to improve with managing shuffling gait with anterior gait but when performing lateral and retrogait patient has difficulty maintaining balance without shuffling pattern.  Continued with interventions to improve this as well as to improve his balance responses.Pt will continue to benefit from skilled physical therapy intervention to address impairments, improve QOL, and attain therapy goals.       Examination-Activity Limitations Locomotion Level;Carry    Examination-Participation Restrictions Community Activity;Cleaning    Stability/Clinical Decision Making Stable/Uncomplicated    Clinical Decision Making High    Rehab Potential Good    PT Frequency 2x / week    PT Duration 8 weeks    PT Treatment/Interventions DME Instruction;Gait training;Stair training;Functional mobility training;Therapeutic activities;Therapeutic exercise;Balance training;Neuromuscular re-education;Patient/family education    PT Next Visit Plan Review [facility] training and continue balance training.   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[place]) 05/30/2020   Anxiety 05/30/2020   Erectile dysfunction 05/30/2020   History of avoidant personality disorder 05/30/2020   History of dysthymic disorder 05/30/2020   History of suicide attempt 05/30/2020   Hypothyroidism, acquired 05/30/2020   Diet-controlled type 2 diabetes mellitus ([place]) 11/30/2016   Chronic insomnia 08/25/2016   COPD (chronic obstructive pulmonary disease) ([place]) 04/03/2016   Alcohol use disorder, mild, abuse 02/27/2016   Severe recurrent major depression with psychotic features ([place]) 02/27/2016   Suicidal ideation 02/03/2016   Body mass index (BMI) of 32.0-32.9 in adult 01/23/2016   Fatty liver 10/24/2015   Benign prostatic hyperplasia with lower urinary tract symptoms 04/08/2015   GERD (gastroesophageal reflux disease) 02/19/2015   Essential hypertension 02/18/2015   Restless legs syndrome 02/18/2015    [facility]  06/18/2022, 1:44 PM

## 2022-06-23 ENCOUNTER — Ambulatory Visit: Payer: 59

## 2022-06-23 DIAGNOSIS — R269 Unspecified abnormalities of gait and mobility: Secondary | ICD-10-CM

## 2022-06-23 DIAGNOSIS — R262 Difficulty in walking, not elsewhere classified: Secondary | ICD-10-CM | POA: Diagnosis not present

## 2022-06-23 DIAGNOSIS — R2681 Unsteadiness on feet: Secondary | ICD-10-CM

## 2022-06-23 DIAGNOSIS — R296 Repeated falls: Secondary | ICD-10-CM

## 2022-06-23 NOTE — Therapy (Signed)
[place] MAIN [facility] SERVICES [address] [place], [place], [address] Phone: [phone]   Fax:  [phone]  Physical Therapy Treatment/Physical Therapy Progress Note   Dates of reporting period  05/19/2022   to   06/23/2022  Patient Details  Name: Mathew Aguilar MRN: [id number] Date of Birth: July 22, 1951 Referring Provider (PT): Dr. Harrel Lemon (PCP)   Encounter Date: 06/23/2022   PT End of Session - 06/23/22 0750     Visit Number 20    Number of Visits 42    Date for PT Re-Evaluation 09/08/22    Authorization Type UHC Medicare, Medicaid secondary    Authorization Time Period 03/25/22-06/17/22; Recert 70/09/7791-90/30/0923    Progress Note Due on Visit 30    PT Start Time 0800    PT Stop Time 0840    PT Time Calculation (min) 40 min    Equipment Utilized During Treatment Gait belt    Activity Tolerance Patient tolerated treatment well;No increased pain    Behavior During Therapy WFL for tasks assessed/performed                    Past Medical History:  Diagnosis Date   Anxiety    Asthma    Borderline personality disorder ([place])    Depression    Dysrhythmia    Erectile dysfunction    Fatty liver    GERD (gastroesophageal reflux disease)    Hypertension    Hypothyroidism    Pulmonary nodule, right    Restless leg    Sleep apnea    Suicide attempt [facility])     Past Surgical History:  Procedure Laterality Date   AMPUTATION ARM     COLONOSCOPY     COLONOSCOPY WITH PROPOFOL N/A 08/22/2018   Procedure: COLONOSCOPY WITH PROPOFOL;  Surgeon: Lollie Sails, MD;  Location: [facility] ENDOSCOPY;  Service: Endoscopy;  Laterality: N/A;    Subjective Assessment -    Subjective Pt reports doing okay- denies any pain or falls and states he came today without his cane- "Trying to walk more at home without it."   Pertinent History Mathew Aguilar is a 24yoM who [facility] neuro referred by PCP after acute insidious onset  difficulty with walking that began roughly 4 weeks ago upon waking that morning. Pt reports his issue to solely be related to difficulty with inititation of gait. Pt reports several falls assicated with this new problem. Pt denies any history of prior gait difficult or falls, denies any current or previous changes in strength, sensorium, no history or associated diziness, no LOC, no presyncope. Pt reports only mild improvement since onset. Pt was seen by PCP in office for this issue on 02/24/22 and referred to PT for referral. No imaging has been done, no referral to other provides.             There were no vitals filed for this visit.   TODAY's INTERVENTIONS: 06/23/22   Progress visit note #20- Please refer to visit #30- Recert visit as all goals were just addressed.    TE  STS with 10 feet (round trip) from chair at wall to support bar back to chair- Focusing on ambulating with longer steps and overcoming tendency to shuffle completes 10 repetitions with good response. INITIALLY Required 16 steps (increased steps with turning) down to 8-9 with final reps.   Neuromuscular re-ed:   Dynamic warm up- Step tap onto airex pad without UE support x 2 min (initial  unsteadiness yet did improve)   Tandem stance on airex balance beam 3 x 30 sec ea LE, without LOB using B ankle/hip balance righting  reaction- all with CGA *as patient was successful with tandem stand- progressed to tandem walk on beam- forward with support bar to left of patient in case of need to reach out for balance x length of beam x 8 reps- initially very unsteady with dynamic stepping and requiring reaching with left UE -yet patient was able to improve with reps and practice and on last 2 trials- no UE support.  Static stand on incline (bottom end of large rockerboard positioned A/P)- hold 30 sec; Repeat with eyes closed x 30 sec - more a/p sway and use of hips to right but overall doing well and no LOB. Added eyes open with  horizontal head turns x 10 with good ability to stabilize.   Dynamic forward/retro step up/over 1/2 foam x 15 reps without UE support (initial unsteadiness with retro step - improved with practice- with 1 LOB requiring min A to recover)   Dynamic Side step up/over 1/2 foam x 15 reps without UE support  (min unsteadiness- VC to take wider step)    Step training to prevent shuffling. Stepping to numbers on box. Pt unable to prevent shuffling gait with stepping to different objects. With PT blocking foot on step pt able to complete without shuffling step but pt unable to integrate independently despite explanation, verbal and tactile cues.   Dynamic walk- high knee march to facilitate increased hip flex with mobility/walking (Increased VC to raise knees as high as possible) x 80 feet  Education provided throughout session via VC/TC and demonstration to facilitate movement at target joints and correct muscle activation for all testing and exercises performed.    Rationale for Evaluation and Treatment Rehabilitation   Education provided throughout session via VC/TC and demonstration to facilitate movement at target joints and correct muscle activation for all testing and exercises performed.    Rationale for Evaluation and Treatment Rehabilitation  HOME EXERCISE PROGRAM:   Access Code: [email or web address] URL: [email or web address] Date: 06/09/2022 Prepared by: Mathew Aguilar  Exercises - Single Leg Stance  - 1 x daily - 7 x weekly - 5 sets - 5-10 sec hold - Tandem Stance  - 1 x daily - 7 x weekly - 5 sets - 5-10 sec hold    PT Short Term Goals - 03/25/22 1541       PT SHORT TERM GOAL #1   Title Pt to reports decrease of falls at home <1x/month.    Baseline eval: 2-3/week since onset; 05/19/2022= Patient reports only 1 fall in past 3 weeks. 06/09/2022= Patient denies any falls.    Time 4    Period Weeks    Status  MET   Target Date 04/22/22      PT SHORT TERM GOAL #2    Title Pt to report successful use of SPC for resumption of AMB activity, rpeorts no longer limitng activity due to fear of falling.    Baseline eval: avoiding much wlaking due ot fear of falling; 05/19/2022= Patient reports walking shorter distances without cane and longer distances with cane ex: walking to mailbox.    Time 4    Period Weeks    Status GOAL MET   Target Date 04/22/22               PT Long Term Goals - 03/25/22 1543       PT LONG TERM  GOAL #1   Title Pt to show improved FOTO score > 15 points to indicated improved feelings of confidence in balance    Baseline eval: 49; 05/19/2022= 44; 06/16/2022= 58%   Time 12   Period Weeks    Status ONGOING    Target Date 09/08/2022     PT LONG TERM GOAL #2   Title Pt to demonstrate 6MWT >1488f c LRAD without any LOB at modI level to indicate ability to safely perform limited community AMB.    Baseline eval: learning SPC use; 05/19/2022= 965 feet with no device- Initial shuffle but vastly improved with distance. 06/09/2022= 1130 feet without an assistive device. 06/15/2022= 1135 feet  without an assistive device.   Time 12   Period Weeks    Status ONGOING   Target Date 09/08/2022     PT LONG TERM GOAL #3   Title Pt to show Berg balance test score increase > 10 points to indicate reduced risk of falls.    Baseline defered to visit 2; 05/19/2022= 44/56; 06/09/2022= 46/56; 06/15/2022= 47/56   Time 12   Period Weeks    Status ONGOING   Target Date 09/08/2022           PT LONG TERM GOAL #4  Title Patient will improve 5 times sit to stand test of less than 15 seconds to indicate improved lower extremity power and decrease risk of falls.    Baseline 9/28: 19.02 seconds; 06/16/2022= 19.08 sec without UE support  Time 12  Period Weeks   Status ONGOING  Target Date 09/08/2022        Plan - 04/16/22 1035     Clinical Impression Statement Patient presents with good motivation for today's progress note visit. Patient focused on  improving his shuffling steps and able to demo some progress through today's balance intervention with some verbal cues and just practice. He does present with occasional LOB - more with retro steps and more dynamic activities. He did improve on balance beam from tandem standing to more walking today. Did not reassess goals due to fact all goals were just updated 2 visits ago. Patient's condition has the potential to improve in response to therapy. Maximum improvement is yet to be obtained. The anticipated improvement is attainable and reasonable in a generally predictable time. Patient will continue to benefit from skilled physical therapy intervention to address impairments, improve QOL, and attain therapy goals.       Examination-Activity Limitations Locomotion Level;Carry    Examination-Participation Restrictions Community Activity;Cleaning    Stability/Clinical Decision Making Stable/Uncomplicated    Clinical Decision Making High    Rehab Potential Good    PT Frequency 2x / week    PT Duration 8 weeks    PT Treatment/Interventions DME Instruction;Gait training;Stair training;Functional mobility training;Therapeutic activities;Therapeutic exercise;Balance training;Neuromuscular re-education;Patient/family education    PT [facility] and [facility] training.   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[place]) 05/30/2020   Anxiety 05/30/2020   Erectile dysfunction 05/30/2020    History of avoidant personality disorder 05/30/2020   History of dysthymic disorder 05/30/2020   History of suicide attempt 05/30/2020   Hypothyroidism, acquired 05/30/2020   Diet-controlled type 2 diabetes mellitus ([place]) 11/30/2016   Chronic insomnia 08/25/2016   COPD (chronic obstructive pulmonary disease) ([place]) 04/03/2016   Alcohol use disorder, mild, abuse 02/27/2016   Severe recurrent major depression with psychotic features ([place]) 02/27/2016   Suicidal ideation 02/03/2016   Body mass index (BMI) of 32.0-32.9 in adult 01/23/2016   Fatty liver 10/24/2015   Benign prostatic hyperplasia with lower urinary tract symptoms 04/08/2015   GERD (gastroesophageal reflux disease) 02/19/2015   Essential hypertension 02/18/2015   Restless legs syndrome 02/18/2015    [facility]  06/23/2022, 8:54 AM

## 2022-06-25 ENCOUNTER — Ambulatory Visit: Payer: 59

## 2022-06-25 DIAGNOSIS — R269 Unspecified abnormalities of gait and mobility: Secondary | ICD-10-CM

## 2022-06-25 DIAGNOSIS — R262 Difficulty in walking, not elsewhere classified: Secondary | ICD-10-CM

## 2022-06-25 DIAGNOSIS — R2681 Unsteadiness on feet: Secondary | ICD-10-CM

## 2022-06-25 DIAGNOSIS — R296 Repeated falls: Secondary | ICD-10-CM

## 2022-06-25 NOTE — Therapy (Signed)
[place] MA[facility] SERVICES [address] [place], [place], [address] Phone: [phone]   Fax:  [phone]  Physical Therapy Treatment     Patient Details  Name: Mathew Aguilar MRN: [id number] Date of Birth: Feb 04, 1951 Referring Provider (PT): Dr. Harrel Lemon (PCP)   Encounter Date: 06/25/2022   PT End of Session - 06/25/22 1149     Visit Number 21    Number of Visits 42    Date for PT Re-Evaluation 09/08/22    Authorization Type UHC Medicare, Medicaid secondary    Authorization Time Period 03/25/22-06/17/22; Recert 25/0/[id number]/88/9169    Progress Note Due on Visit 30    PT Start Time 1145    PT Stop Time 1226    PT Time Calculation (min) 41 min    Equipment Utilized During Treatment Gait belt    Activity Tolerance Patient tolerated treatment well;No increased pain    Behavior During Therapy WFL for tasks assessed/performed                     Past Medical History:  Diagnosis Date   Anxiety    Asthma    Borderline personality disorder ([place])    Depression    Dysrhythmia    Erectile dysfunction    Fatty liver    GERD (gastroesophageal reflux disease)    Hypertension    Hypothyroidism    Pulmonary nodule, right    Restless leg    Sleep apnea    Suicide attempt [facility])     Past Surgical History:  Procedure Laterality Date   AMPUTATION ARM     COLONOSCOPY     COLONOSCOPY WITH PROPOFOL N/A 08/22/2018   Procedure: COLONOSCOPY WITH PROPOFOL;  Surgeon: Lollie Sails, MD;  Location: [facility] ENDOSCOPY;  Service: Endoscopy;  Laterality: N/A;    Subjective Assessment -    Subjective Patient reports doing okay- denies any falls and no pain. Reports using cane less and less in home or community.    Pertinent History Mathew Aguilar is a 68yoM who comes to [facility] OPPT neuro referred by PCP after acute insidious onset difficulty with walking that began roughly 4 weeks ago upon waking that morning. Pt reports his issue  to solely be related to difficulty with inititation of gait. Pt reports several falls assicated with this new problem. Pt denies any history of prior gait difficult or falls, denies any current or previous changes in strength, sensorium, no history or associated diziness, no LOC, no presyncope. Pt reports only mild improvement since onset. Pt was seen by PCP in office for this issue on 02/24/22 and referred to PT for referral. No imaging has been done, no referral to other provides.             There were no vitals filed for this visit.   TODAY's INTERVENTIONS: 06/26/22     THEREX:   STS x 20 reps  without UE Support Standing calf raises x 20 reps    Neuromuscular re-ed:    Dynamic walking in hallway- Horizontal head turning (calling out items on sticky notes positioned on both walls) x 75 feet x 4- Improved with balance and able to call out each item 2-4th trial.  Dynamic walking with abrupt turn and stop - x 10 times during walk- Difficulty with turning and then stopping requiring increased time > 3 sec and increased steps.   Dynamic Step up onto airex pad without UE support x 2 min (initial unsteadiness and  difficulty clearing foot - did improve quickly with practice)   Ladder drills to facilitate coordinated movement and take longer and/or wider steps  - approach from side of agility ladder and perform -step into square then straight back followed by side step and repeat down the ladder and back x 2.  - Side stepping in agility ladder - down and back (both feet in square then wide step over with BLE into next square- repeat)   - Forward walking (1 foot per square) focusing on reciprocal steps  (good ability to take longer step with visual cue of square) down and back x 4  Dynamic walking lunges x 10 feet - down and back x 2 trials- Very unsteady with minimal step length initially- much improved sequencing and balance on 2nd trial   Dynamic walk- high knee march to facilitate  increased hip flex with mobility/walking (Increased VC to raise knees as high as possible) x 80 feet  Education provided throughout session via VC/TC and demonstration to facilitate movement at target joints and correct muscle activation for all testing and exercises performed.    Rationale for Evaluation and Treatment Rehabilitation   Education provided throughout session via VC/TC and demonstration to facilitate movement at target joints and correct muscle activation for all testing and exercises performed.    Rationale for Evaluation and Treatment Rehabilitation  HOME EXERCISE PROGRAM:   Access Code: [email or web address] URL: [email or web address] Date: 06/09/2022 Prepared by:    Exercises - Single Leg Stance  - 1 x daily - 7 x weekly - 5 sets - 5-10 sec hold - Tandem Stance  - 1 x daily - 7 x weekly - 5 sets - 5-10 sec hold    PT Short Term Goals - 03/25/22 1541       PT SHORT TERM GOAL #1   Title Pt to reports decrease of falls at home <1x/month.    Baseline eval: 2-3/week since onset; 05/19/2022= Patient reports only 1 fall in past 3 weeks. 06/09/2022= Patient denies any falls.    Time 4    Period Weeks    Status  MET   Target Date 04/22/22      PT SHORT TERM GOAL #2   Title Pt to report successful use of SPC for resumption of AMB activity, rpeorts no longer limitng activity due to fear of falling.    Baseline eval: avoiding much wlaking due ot fear of falling; 05/19/2022= Patient reports walking shorter distances without cane and longer distances with cane ex: walking to mailbox.    Time 4    Period Weeks    Status GOAL MET   Target Date 04/22/22               PT Long Term Goals - 03/25/22 1543       PT LONG TERM GOAL #1   Title Pt to show improved FOTO score > 15 points to indicated improved feelings of confidence in balance    Baseline eval: 49; 05/19/2022= 44; 06/16/2022= 58%   Time 12   Period Weeks    Status ONGOING    Target Date  09/08/2022     PT LONG TERM GOAL #2   Title Pt to demonstrate 6MWT >1400ft c LRAD without any LOB at modI level to indicate ability to safely perform limited community AMB.    Baseline eval: learning SPC use; 05/19/2022= 965 feet with no device- Initial shuffle but vastly improved with distance. 06/09/2022= 1130 feet without an assistive device. 06/15/2022= 1135 feet    without an assistive device.   Time 12   Period Weeks    Status ONGOING   Target Date 09/08/2022     PT LONG TERM GOAL #3   Title Pt to show Berg balance test score increase > 10 points to indicate reduced risk of falls.    Baseline defered to visit 2; 05/19/2022= 44/56; 06/09/2022= 46/56; 06/15/2022= 47/56   Time 12   Period Weeks    Status ONGOING   Target Date 09/08/2022           PT LONG TERM GOAL #4  Title Patient will improve 5 times sit to stand test of less than 15 seconds to indicate improved lower extremity power and decrease risk of falls.    Baseline 9/28: 19.02 seconds; 06/16/2022= 19.08 sec without UE support  Time 12  Period Weeks   Status ONGOING  Target Date 09/08/2022        Plan - 04/16/22 1035     Clinical Impression Statement Patient presents with good motivation for today's  visit. Patient presents with improving steps with less shuffling overall. He performed well with lunge walk and ladder drills without any significant LOB. Patient will continue to benefit from skilled physical therapy intervention to address impairments, improve QOL, and attain therapy goals.       Examination-Activity Limitations Locomotion Level;Carry    Examination-Participation Restrictions Community Activity;Cleaning    Stability/Clinical Decision Making Stable/Uncomplicated    Clinical Decision Making High    Rehab Potential Good    PT Frequency 2x / week    PT Duration 8 weeks    PT Treatment/Interventions DME Instruction;Gait training;Stair training;Functional mobility training;Therapeutic activities;Therapeutic  exercise;Balance training;Neuromuscular re-education;Patient/family education    PT Next Visit Plan Review [facility] training and continue balance training.   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[place]) 05/30/2020   Anxiety 05/30/2020   Erectile dysfunction 05/30/2020   History of avoidant personality disorder 05/30/2020   History of dysthymic disorder 05/30/2020   History of suicide attempt 05/30/2020   Hypothyroidism, acquired 05/30/2020   Diet-controlled type 2 diabetes mellitus ([place]) 11/30/2016   Chronic insomnia 08/25/2016   COPD (chronic obstructive pulmonary disease) ([place]) 04/03/2016   Alcohol use disorder, mild, abuse 02/27/2016   Severe recurrent major depression with psychotic features ([place]) 02/27/2016   Suicidal ideation 02/03/2016   Body mass index (BMI) of 32.0-32.9 in adult 01/23/2016   Fatty liver 10/24/2015   Benign prostatic hyperplasia with lower urinary tract symptoms 04/08/2015   GERD (gastroesophageal reflux disease) 02/19/2015   Essential hypertension 02/18/2015   Restless legs syndrome 02/18/2015    [facility]  06/26/2022, 10:16 AM

## 2022-06-30 ENCOUNTER — Ambulatory Visit: Payer: 59

## 2022-06-30 ENCOUNTER — Ambulatory Visit: Payer: Medicare Other

## 2022-06-30 DIAGNOSIS — R296 Repeated falls: Secondary | ICD-10-CM

## 2022-06-30 DIAGNOSIS — R2681 Unsteadiness on feet: Secondary | ICD-10-CM

## 2022-06-30 DIAGNOSIS — R262 Difficulty in walking, not elsewhere classified: Secondary | ICD-10-CM | POA: Diagnosis not present

## 2022-06-30 DIAGNOSIS — R269 Unspecified abnormalities of gait and mobility: Secondary | ICD-10-CM

## 2022-06-30 NOTE — Therapy (Signed)
[place] MAIN [facility] SERVICES [address] [place], [place], [address] Phone: [phone]   Fax:  [phone]  Physical Therapy Treatment     Patient Details  Name: Mathew Aguilar MRN: [id number] Date of Birth: December 10, 1950 Referring Provider (PT): Dr. Harrel Lemon (PCP)   Encounter Date: 06/30/2022   PT End of Session - 06/30/22 1430     Visit Number 22    Number of Visits 42    Date for PT Re-Evaluation 09/08/22    Authorization Type UHC Medicare, Medicaid secondary    Authorization Time Period 03/25/22-06/17/22; Recert 18/04/4165-03/13/1600    Progress Note Due on Visit 30    PT Start Time 1429    PT Stop Time 1513    PT Time Calculation (min) 44 min    Equipment Utilized During Treatment Gait belt    Activity Tolerance Patient tolerated treatment well;No increased pain    Behavior During Therapy WFL for tasks assessed/performed                     Past Medical History:  Diagnosis Date   Anxiety    Asthma    Borderline personality disorder ([place])    Depression    Dysrhythmia    Erectile dysfunction    Fatty liver    GERD (gastroesophageal reflux disease)    Hypertension    Hypothyroidism    Pulmonary nodule, right    Restless leg    Sleep apnea    Suicide attempt [facility])     Past Surgical History:  Procedure Laterality Date   AMPUTATION ARM     COLONOSCOPY     COLONOSCOPY WITH PROPOFOL N/A 08/22/2018   Procedure: COLONOSCOPY WITH PROPOFOL;  Surgeon: Lollie Sails, MD;  Location: [facility] ENDOSCOPY;  Service: Endoscopy;  Laterality: N/A;    Subjective Assessment -    Subjective Patient reports having a couple of days of unsteadiness- reports using his cane today.    Pertinent History [name] is a 58yoM who comes to [facility] OPPT neuro referred by PCP after acute insidious onset difficulty with walking that began roughly 4 weeks ago upon waking that morning. Pt reports his issue to solely be related to  difficulty with inititation of gait. Pt reports several falls assicated with this new problem. Pt denies any history of prior gait difficult or falls, denies any current or previous changes in strength, sensorium, no history or associated diziness, no LOC, no presyncope. Pt reports only mild improvement since onset. Pt was seen by PCP in office for this issue on 02/24/22 and referred to PT for referral. No imaging has been done, no referral to other provides.             There were no vitals filed for this visit.   TODAY's INTERVENTIONS: 07/01/22     THEREX:   STS x 20 reps  without UE Support Standing calf raises x 20 reps  Hip march alt LE x 20 reps (focusing on height of march) Forward lunge squat- x 15 reps each LE  Neuromuscular re-ed:    -Step up with 1 LE and opp LE performs high knee march back down to firm surface x 12 reps each LE with 1 UE support.   -Dynamic walking with abrupt turn and stop - x 10 times during walk- improving ability - able to stop within 3 sec each turn.   -Dynamic Step up onto airex pad without UE support x 2 min (mild  unsteadiness yet no tripping of toes on pad or obvious LOB) in // bars.   Ladder drills to facilitate coordinated movement and take longer and/or wider steps    - Side stepping in agility ladder - down and back (both feet in square then wide step over with BLE into next square )Down and back x 10 reps   - Forward walking (1 foot per square) focusing on reciprocal steps  (good ability to take longer step with visual cue of square) down and back x 10 times   Dynamic walk-VC for large steps (reciprocal steps) x 80 feet x 2 rounds- no LOB and no observed shuffling  Dynamic walking- stepping over 2 orange hurdles and 1/2 foam in // bars- down and back x 10 trials.   Education provided throughout session via VC/TC and demonstration to facilitate movement at target joints and correct muscle activation for all testing and exercises  performed.    Rationale for Evaluation and Treatment Rehabilitation   Education provided throughout session via VC/TC and demonstration to facilitate movement at target joints and correct muscle activation for all testing and exercises performed.    Rationale for Evaluation and Treatment Rehabilitation  HOME EXERCISE PROGRAM:   Access Code: [email or web address] URL: [email or web address] Date: 06/09/2022 Prepared by: Sande Brothers  Exercises - Single Leg Stance  - 1 x daily - 7 x weekly - 5 sets - 5-10 sec hold - Tandem Stance  - 1 x daily - 7 x weekly - 5 sets - 5-10 sec hold    PT Short Term Goals - 03/25/22 1541       PT SHORT TERM GOAL #1   Title Pt to reports decrease of falls at home <1x/month.    Baseline eval: 2-3/week since onset; 05/19/2022= Patient reports only 1 fall in past 3 weeks. 06/09/2022= Patient denies any falls.    Time 4    Period Weeks    Status  MET   Target Date 04/22/22      PT SHORT TERM GOAL #2   Title Pt to report successful use of SPC for resumption of AMB activity, rpeorts no longer limitng activity due to fear of falling.    Baseline eval: avoiding much wlaking due ot fear of falling; 05/19/2022= Patient reports walking shorter distances without cane and longer distances with cane ex: walking to mailbox.    Time 4    Period Weeks    Status GOAL MET   Target Date 04/22/22               PT Long Term Goals - 03/25/22 1543       PT LONG TERM GOAL #1   Title Pt to show improved FOTO score > 15 points to indicated improved feelings of confidence in balance    Baseline eval: 49; 05/19/2022= 44; 06/16/2022= 58%   Time 12   Period Weeks    Status ONGOING    Target Date 09/08/2022     PT LONG TERM GOAL #2   Title Pt to demonstrate 6MWT >1446f c LRAD without any LOB at modI level to indicate ability to safely perform limited community AMB.    Baseline eval: learning SPC use; 05/19/2022= 965 feet with no device- Initial shuffle but  vastly improved with distance. 06/09/2022= 1130 feet without an assistive device. 06/15/2022= 1135 feet  without an assistive device.   Time 12   Period Weeks    Status ONGOING   Target Date 09/08/2022     PT LONG  TERM GOAL #3   Title Pt to show Berg balance test score increase > 10 points to indicate reduced risk of falls.    Baseline defered to visit 2; 05/19/2022= 44/56; 06/09/2022= 46/56; 06/15/2022= 47/56   Time 12   Period Weeks    Status ONGOING   Target Date 09/08/2022           PT LONG TERM GOAL #4  Title Patient will improve 5 times sit to stand test of less than 15 seconds to indicate improved lower extremity power and decrease risk of falls.    Baseline 9/28: 19.02 seconds; 06/16/2022= 19.08 sec without UE support  Time 12  Period Weeks   Status ONGOING  Target Date 09/08/2022        Plan - 04/16/22 1035     Clinical Impression Statement Patient presents with good motivation for today's session. He presents today with improving LE strength - no difficulty today with sit to stand and with lunges. He continues to have some difficulty with coordinating activities yet does improve with increase repetition. Patient will continue to benefit from skilled physical therapy intervention to address impairments, improve QOL, and attain therapy goals.       Examination-Activity Limitations Locomotion Level;Carry    Examination-Participation Restrictions Community Activity;Cleaning    Stability/Clinical Decision Making Stable/Uncomplicated    Clinical Decision Making High    Rehab Potential Good    PT Frequency 2x / week    PT Duration 8 weeks    PT Treatment/Interventions DME Instruction;Gait training;Stair training;Functional mobility training;Therapeutic activities;Therapeutic exercise;Balance training;Neuromuscular re-education;Patient/family education    PT Next Visit Plan Review [facility] training and continue balance training.   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[place]) 05/30/2020   Anxiety 05/30/2020   Erectile dysfunction 05/30/2020   History of avoidant personality disorder 05/30/2020   History of dysthymic disorder 05/30/2020   History of suicide attempt 05/30/2020   Hypothyroidism, acquired 05/30/2020   Diet-controlled type 2 diabetes mellitus ([place]) 11/30/2016   Chronic insomnia 08/25/2016   COPD (chronic obstructive pulmonary disease) ([place]) 04/03/2016   Alcohol use disorder, mild, abuse 02/27/2016   Severe recurrent major depression with psychotic features ([place]) 02/27/2016   Suicidal ideation 02/03/2016   Body mass index (BMI) of 32.0-32.9 in adult 01/23/2016   Fatty liver 10/24/2015   Benign prostatic hyperplasia with lower urinary tract symptoms 04/08/2015   GERD (gastroesophageal reflux disease) 02/19/2015   Essential hypertension 02/18/2015   Restless legs syndrome 02/18/2015    [facility]  07/01/2022, 1:01 PM

## 2022-07-01 NOTE — Therapy (Unsigned)
[place] MAIN [facility] SERVICES [address] [place], [place], [address] Phone: [phone]   Fax:  [phone]  Physical Therapy Treatment     Patient Details  Name: Mathew Aguilar MRN: [id number] Date of Birth: [birth year] Referring Provider (PT): Dr. Harrel Lemon (PCP)   Encounter Date: 07/02/2022             Past Medical History:  Diagnosis Date   Anxiety    Asthma    Borderline personality disorder [facility])    Depression    Dysrhythmia    Erectile dysfunction    Fatty liver    GERD (gastroesophageal reflux disease)    Hypertension    Hypothyroidism    Pulmonary nodule, right    Restless leg    Sleep apnea    Suicide attempt [facility])     Past Surgical History:  Procedure Laterality Date   AMPUTATION ARM     COLONOSCOPY     COLONOSCOPY WITH PROPOFOL N/A 08/22/2018   Procedure: COLONOSCOPY WITH PROPOFOL;  Surgeon: Lollie Sails, MD;  Location: [facility] ENDOSCOPY;  Service: Endoscopy;  Laterality: N/A;    Subjective Assessment -    Subjective Patient reports having a couple of days of unsteadiness- reports using his cane today.    Pertinent History Mathew Aguilar is a 41yoM who comes to [facility] OPPT neuro referred by PCP after acute insidious onset difficulty with walking that began roughly 4 weeks ago upon waking that morning. Pt reports his issue to solely be related to difficulty with inititation of gait. Pt reports several falls assicated with this new problem. Pt denies any history of prior gait difficult or falls, denies any current or previous changes in strength, sensorium, no history or associated diziness, no LOC, no presyncope. Pt reports only mild improvement since onset. Pt was seen by PCP in office for this issue on 02/24/22 and referred to PT for referral. No imaging has been done, no referral to other provides.             There were no vitals filed for this visit.   TODAY's INTERVENTIONS:  07/01/22     THEREX:   STS x 20 reps  without UE Support Standing calf raises x 20 reps  Hip march alt LE x 20 reps ( focusing on height of march Forward lunge squat- x 15 reps each LE  Neuromuscular re-ed:    -Step up with 1 LE and opp LE performs high knee march back down to firm surface x 12 reps each LE with 1 UE support.   -Dynamic walking with abrupt turn and stop - x 10 times during walk- improving ability - able to stop within 3 sec each turn.   -Dynamic Step up onto airex pad without UE support x 2 min (mild unsteadiness yet no tripping of toes on pad or obvious LOB) in // bars.   Ladder drills to facilitate coordinated movement and take longer and/or wider steps    - Side stepping in agility ladder - down and back (both feet in square then wide step over with BLE into next square )Down and back x 10 reps   - Forward walking (1 foot per square) focusing on reciprocal steps  (good ability to take longer step with visual cue of square) down and back x 10 times   Dynamic walk-VC for large steps (reciprocal steps) x 80 feet x 2 rounds- no LOB and no observed shuffling  Dynamic walking- stepping over 2  orange hurdles and 1/2 foam in // bars- down and back x 10 trials.   Education provided throughout session via VC/TC and demonstration to facilitate movement at target joints and correct muscle activation for all testing and exercises performed.    Rationale for Evaluation and Treatment Rehabilitation   Education provided throughout session via VC/TC and demonstration to facilitate movement at target joints and correct muscle activation for all testing and exercises performed.    Rationale for Evaluation and Treatment Rehabilitation  HOME EXERCISE PROGRAM:   Access Code: [email or web address] URL: [email or web address] Date: 06/09/2022 Prepared by: Sande Brothers  Exercises - Single Leg Stance  - 1 x daily - 7 x weekly - 5 sets - 5-10 sec hold - Tandem Stance   - 1 x daily - 7 x weekly - 5 sets - 5-10 sec hold    PT Short Term Goals - 03/25/22 1541       PT SHORT TERM GOAL #1   Title Pt to reports decrease of falls at home <1x/month.    Baseline eval: 2-3/week since onset; 05/19/2022= Patient reports only 1 fall in past 3 weeks. 06/09/2022= Patient denies any falls.    Time 4    Period Weeks    Status  MET   Target Date 04/22/22      PT SHORT TERM GOAL #2   Title Pt to report successful use of SPC for resumption of AMB activity, rpeorts no longer limitng activity due to fear of falling.    Baseline eval: avoiding much wlaking due ot fear of falling; 05/19/2022= Patient reports walking shorter distances without cane and longer distances with cane ex: walking to mailbox.    Time 4    Period Weeks    Status GOAL MET   Target Date 04/22/22               PT Long Term Goals - 03/25/22 1543       PT LONG TERM GOAL #1   Title Pt to show improved FOTO score > 15 points to indicated improved feelings of confidence in balance    Baseline eval: 49; 05/19/2022= 44; 06/16/2022= 58%   Time 12   Period Weeks    Status ONGOING    Target Date 09/08/2022     PT LONG TERM GOAL #2   Title Pt to demonstrate 6MWT >1438f c LRAD without any LOB at modI level to indicate ability to safely perform limited community AMB.    Baseline eval: learning SPC use; 05/19/2022= 965 feet with no device- Initial shuffle but vastly improved with distance. 06/09/2022= 1130 feet without an assistive device. 06/15/2022= 1135 feet  without an assistive device.   Time 12   Period Weeks    Status ONGOING   Target Date 09/08/2022     PT LONG TERM GOAL #3   Title Pt to show Berg balance test score increase > 10 points to indicate reduced risk of falls.    Baseline defered to visit 2; 05/19/2022= 44/56; 06/09/2022= 46/56; 06/15/2022= 47/56   Time 12   Period Weeks    Status ONGOING   Target Date 09/08/2022           PT LONG TERM GOAL #4  Title Patient will improve 5 times  sit to stand test of less than 15 seconds to indicate improved lower extremity power and decrease risk of falls.    Baseline 9/28: 19.02 seconds; 06/16/2022= 19.08 sec without UE support  Time 12  Period Weeks  Status ONGOING  Target Date 09/08/2022        Plan - 04/16/22 1035     Clinical Impression Statement Patient presents with good motivation for today's  visit. Patient presents with improving steps with less shuffling overall. He performed well with lunge walk and ladder drills without any significant LOB. Patient will continue to benefit from skilled physical therapy intervention to address impairments, improve QOL, and attain therapy goals.       Examination-Activity Limitations Locomotion Level;Carry    Examination-Participation Restrictions Community Activity;Cleaning    Stability/Clinical Decision Making Stable/Uncomplicated    Clinical Decision Making High    Rehab Potential Good    PT Frequency 2x / week    PT Duration 8 weeks    PT Treatment/Interventions DME Instruction;Gait training;Stair training;Functional mobility training;Therapeutic activities;Therapeutic exercise;Balance training;Neuromuscular re-education;Patient/family education    PT Next Visit Plan Review [facility] training and continue balance training.   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[place]) 05/30/2020   Anxiety 05/30/2020   Erectile dysfunction 05/30/2020   History of avoidant personality disorder 05/30/2020    History of dysthymic disorder 05/30/2020   History of suicide attempt 05/30/2020   Hypothyroidism, acquired 05/30/2020   Diet-controlled type 2 diabetes mellitus ([place]) 11/30/2016   Chronic insomnia 08/25/2016   COPD (chronic obstructive pulmonary disease) ([place]) 04/03/2016   Alcohol use disorder, mild, abuse 02/27/2016   Severe recurrent major depression with psychotic features ([place]) 02/27/2016   Suicidal ideation 02/03/2016   Body mass index (BMI) of 32.0-32.9 in adult 01/23/2016   Fatty liver 10/24/2015   Benign prostatic hyperplasia with lower urinary tract symptoms 04/08/2015   GERD (gastroesophageal reflux disease) 02/19/2015   Essential hypertension 02/18/2015   Restless legs syndrome 02/18/2015    [facility]  07/01/2022, 8:36 AM

## 2022-07-02 ENCOUNTER — Encounter: Payer: Self-pay | Admitting: Physical Therapy

## 2022-07-02 ENCOUNTER — Ambulatory Visit: Payer: Medicare Other

## 2022-07-02 ENCOUNTER — Ambulatory Visit: Payer: 59 | Admitting: Physical Therapy

## 2022-07-02 DIAGNOSIS — R2681 Unsteadiness on feet: Secondary | ICD-10-CM

## 2022-07-02 DIAGNOSIS — R269 Unspecified abnormalities of gait and mobility: Secondary | ICD-10-CM

## 2022-07-02 DIAGNOSIS — R262 Difficulty in walking, not elsewhere classified: Secondary | ICD-10-CM | POA: Diagnosis not present

## 2022-07-02 DIAGNOSIS — R296 Repeated falls: Secondary | ICD-10-CM

## 2022-07-07 ENCOUNTER — Ambulatory Visit: Payer: Medicare Other

## 2022-07-07 ENCOUNTER — Ambulatory Visit: Payer: 59

## 2022-07-07 DIAGNOSIS — R269 Unspecified abnormalities of gait and mobility: Secondary | ICD-10-CM

## 2022-07-07 DIAGNOSIS — R2681 Unsteadiness on feet: Secondary | ICD-10-CM

## 2022-07-07 DIAGNOSIS — R262 Difficulty in walking, not elsewhere classified: Secondary | ICD-10-CM

## 2022-07-07 DIAGNOSIS — R296 Repeated falls: Secondary | ICD-10-CM

## 2022-07-07 NOTE — Therapy (Signed)
[place] MAIN [facility] SERVICES [address] [place], [place], [address] Phone: [phone]   Fax:  [phone]  Physical Therapy Treatment     Patient Details  Name: Mathew Aguilar MRN: [id number] Date of Birth: May 22, 1951 Referring Provider (PT): Dr. Harrel Lemon (PCP)   Encounter Date: 07/07/2022   PT End of Session - 07/07/22 1411     Visit Number 24    Number of Visits 42    Date for PT Re-Evaluation 09/08/22    Authorization Type UHC Medicare, Medicaid secondary    Authorization Time Period 03/25/22-06/17/22; Recert [id number]    Progress Note Due on Visit 30    PT Start Time 1412    PT Stop Time 1456    PT Time Calculation (min) 44 min    Equipment Utilized During Treatment Gait belt    Activity Tolerance Patient tolerated treatment well;No increased pain    Behavior During Therapy WFL for tasks assessed/performed                      Past Medical History:  Diagnosis Date   Anxiety    Asthma    Borderline personality disorder ([place])    Depression    Dysrhythmia    Erectile dysfunction    Fatty liver    GERD (gastroesophageal reflux disease)    Hypertension    Hypothyroidism    Pulmonary nodule, right    Restless leg    Sleep apnea    Suicide attempt [name])     Past Surgical History:  Procedure Laterality Date   AMPUTATION ARM     COLONOSCOPY     COLONOSCOPY WITH PROPOFOL N/A 08/22/2018   Procedure: COLONOSCOPY WITH PROPOFOL;  Surgeon: Lollie Sails, MD;  Location: [facility] ENDOSCOPY;  Service: Endoscopy;  Laterality: N/A;    Subjective Assessment -    Subjective Pt reports moving slower today and states more unsteady.    Pertinent History Mathew Aguilar is a 85yoM who comes to [facility] OPPT neuro referred by PCP after acute insidious onset difficulty with walking that began roughly 4 weeks ago upon waking that morning. Pt reports his issue to solely be related to difficulty with inititation  of gait. Pt reports several falls assicated with this new problem. Pt denies any history of prior gait difficult or falls, denies any current or previous changes in strength, sensorium, no history or associated diziness, no LOC, no presyncope. Pt reports only mild improvement since onset. Pt was seen by PCP in office for this issue on 02/24/22 and referred to PT for referral. No imaging has been done, no referral to other provides.             There were no vitals filed for this visit.   TODAY's INTERVENTIONS: 07/07/22     THEREX:   -STS with walk and another STS (to chair 25 feet anterior) 10 reps  (some unsteadiness with initiation of standing/gait but did improve with practice)  3# AW donned, cues for large steps initially to overcome shuffling gait.  -Standing calf raises (from edge of steps with left UE support)  with 3# AW x 20 reps  -Seated LAQ with 3lb AW x 20 reps each LE - Standing lunge (Forward) walk in ladder- 3lb AW BLE - down and back x 1 trip   Neuromuscular re-ed:    Step up laterally x 15 ea LE with UE assist  -Dynamic lateral Step up onto airex pad without UE  support x 20, pt takes compensatory shuffle each time stepping on and off of airex, unable to correct with cues. 1 LOB  Ladder drills to facilitate coordinated movement and take longer and/or wider steps  - Side stepping in agility ladder - down and back (both feet in square then wide step over with BLE into next square )Down and back x 5 time through x 2 rounds with 3# AW donned   - a few instances of LOB when R sidestepping and LLE catches on ladder, corrected with abrupt stepping pattern to regain equilibrium    Ladder drill- start on left side of agility ladder using 3lb  - step inside of square of ladder then side step out then forward step and repeat until reached the end of ladder x 3   Ladder drill - diagonal forward/backward step - down the agility ladder and back with use of 3lb AW's   Forward  and backward walking drills- 3lb AW's donned x 15 feet x 5 trials down and back  Ladder drill (forward - start inside of square with b feet- then large forward diagonal steps with BLE then narrow step into next square- down and back x 3 trials.    Education provided throughout session via VC/TC and demonstration to facilitate movement at target joints and correct muscle activation for all testing and exercises performed.    Rationale for Evaluation and Treatment Rehabilitation  HOME EXERCISE PROGRAM:   Access Code: [email or web address] URL: [email or web address] Date: 06/09/2022 Prepared by: Sande Brothers  Exercises - Single Leg Stance  - 1 x daily - 7 x weekly - 5 sets - 5-10 sec hold - Tandem Stance  - 1 x daily - 7 x weekly - 5 sets - 5-10 sec hold    PT Short Term Goals - 03/25/22 1541       PT SHORT TERM GOAL #1   Title Pt to reports decrease of falls at home <1x/month.    Baseline eval: 2-3/week since onset; 05/19/2022= Patient reports only 1 fall in past 3 weeks. 06/09/2022= Patient denies any falls.    Time 4    Period Weeks    Status  MET   Target Date 04/22/22      PT SHORT TERM GOAL #2   Title Pt to report successful use of SPC for resumption of AMB activity, rpeorts no longer limitng activity due to fear of falling.    Baseline eval: avoiding much wlaking due ot fear of falling; 05/19/2022= Patient reports walking shorter distances without cane and longer distances with cane ex: walking to mailbox.    Time 4    Period Weeks    Status GOAL MET   Target Date 04/22/22               PT Long Term Goals - 03/25/22 1543       PT LONG TERM GOAL #1   Title Pt to show improved FOTO score > 15 points to indicated improved feelings of confidence in balance    Baseline eval: 49; 05/19/2022= 44; 06/16/2022= 58%   Time 12   Period Weeks    Status ONGOING    Target Date 09/08/2022     PT LONG TERM GOAL #2   Title Pt to demonstrate 6MWT >1431f c LRAD without any  LOB at modI level to indicate ability to safely perform limited community AMB.    Baseline eval: learning SPC use; 05/19/2022= 965 feet with no device- Initial shuffle but vastly improved with  distance. 06/09/2022= 1130 feet without an assistive device. 06/15/2022= 1135 feet  without an assistive device.   Time 12   Period Weeks    Status ONGOING   Target Date 09/08/2022     PT LONG TERM GOAL #3   Title Pt to show Berg balance test score increase > 10 points to indicate reduced risk of falls.    Baseline defered to visit 2; 05/19/2022= 44/56; 06/09/2022= 46/56; 06/15/2022= 47/56   Time 12   Period Weeks    Status ONGOING   Target Date 09/08/2022           PT LONG TERM GOAL #4  Title Patient will improve 5 times sit to stand test of less than 15 seconds to indicate improved lower extremity power and decrease risk of falls.    Baseline 9/28: 19.02 seconds; 06/16/2022= 19.08 sec without UE support  Time 12  Period Weeks   Status ONGOING  Target Date 09/08/2022        Plan - 04/16/22 1035     Clinical Impression Statement Patient presents with good motivation for today's  visit. Pt with difficulty with coordination of movement with most ladder drills yet with increased VC He did demonstrate some adaptability and improved his balance  Patient will continue to benefit from skilled physical therapy intervention to address impairments, improve QOL, and attain therapy goals.       Examination-Activity Limitations Locomotion Level;Carry    Examination-Participation Restrictions Community Activity;Cleaning    Stability/Clinical Decision Making Stable/Uncomplicated    Clinical Decision Making High    Rehab Potential Good    PT Frequency 2x / week    PT Duration 8 weeks    PT Treatment/Interventions DME Instruction;Gait training;Stair training;Functional mobility training;Therapeutic activities;Therapeutic exercise;Balance training;Neuromuscular re-education;Patient/family education    PT  Next Visit Plan Review [facility] training and continue balance training.   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[place]) 05/30/2020   Anxiety 05/30/2020   Erectile dysfunction 05/30/2020   History of avoidant personality disorder 05/30/2020   History of dysthymic disorder 05/30/2020   History of suicide attempt 05/30/2020   Hypothyroidism, acquired 05/30/2020   Diet-controlled type 2 diabetes mellitus ([place]) 11/30/2016   Chronic insomnia 08/25/2016   COPD (chronic obstructive pulmonary disease) ([place]) 04/03/2016   Alcohol use disorder, mild, abuse 02/27/2016   Severe recurrent major depression with psychotic features ([place]) 02/27/2016   Suicidal ideation 02/03/2016   Body mass index (BMI) of 32.0-32.9 in adult 01/23/2016   Fatty liver 10/24/2015   Benign prostatic hyperplasia with lower urinary tract symptoms 04/08/2015   GERD (gastroesophageal reflux disease) 02/19/2015   Essential hypertension 02/18/2015   Restless legs syndrome 02/18/2015    [facility]  07/07/2022, 5:12 PM

## 2022-07-09 ENCOUNTER — Ambulatory Visit: Payer: Medicare Other

## 2022-07-09 ENCOUNTER — Ambulatory Visit: Payer: 59

## 2022-07-09 DIAGNOSIS — R2681 Unsteadiness on feet: Secondary | ICD-10-CM

## 2022-07-09 DIAGNOSIS — R262 Difficulty in walking, not elsewhere classified: Secondary | ICD-10-CM | POA: Diagnosis not present

## 2022-07-09 DIAGNOSIS — R296 Repeated falls: Secondary | ICD-10-CM

## 2022-07-09 DIAGNOSIS — R269 Unspecified abnormalities of gait and mobility: Secondary | ICD-10-CM

## 2022-07-09 NOTE — Therapy (Signed)
[place] MAIN [facility] SERVICES [address] [place], [place], [address] Phone: [phone]   Fax:  [phone]  Physical Therapy Treatment     Patient Details  Name: Mathew Aguilar MRN: [id number] Date of Birth: 07-22-1951 Referring Provider (PT): Dr. Harrel Lemon (PCP)   Encounter Date: 07/09/2022   PT End of Session - 07/09/22 1132     Visit Number 25    Number of Visits 42    Date for PT Re-Evaluation 09/08/22    Authorization Type UHC Medicare, Medicaid secondary    Authorization Time Period 03/25/22-06/17/22; Recert 95/10/8411-24/40/1027    Progress Note Due on Visit 30    PT Start Time 1130    PT Stop Time 1213    PT Time Calculation (min) 43 min    Equipment Utilized During Treatment Gait belt    Activity Tolerance Patient tolerated treatment well    Behavior During Therapy WFL for tasks assessed/performed                      Past Medical History:  Diagnosis Date   Anxiety    Asthma    Borderline personality disorder ([place])    Depression    Dysrhythmia    Erectile dysfunction    Fatty liver    GERD (gastroesophageal reflux disease)    Hypertension    Hypothyroidism    Pulmonary nodule, right    Restless leg    Sleep apnea    Suicide attempt [facility])     Past Surgical History:  Procedure Laterality Date   AMPUTATION ARM     COLONOSCOPY     COLONOSCOPY WITH PROPOFOL N/A 08/22/2018   Procedure: COLONOSCOPY WITH PROPOFOL;  Surgeon: Lollie Sails, MD;  Location: [facility] ENDOSCOPY;  Service: Endoscopy;  Laterality: N/A;    Subjective Assessment -    Subjective Pt denies falls. Remains with shuffling steps when initiating gait.   Pertinent History Mathew Aguilar is a 4yoM who comes to [facility] OPPT neuro referred by PCP after acute insidious onset difficulty with walking that began roughly 4 weeks ago upon waking that morning. Pt reports his issue to solely be related to difficulty with inititation of gait.  Pt reports several falls assicated with this new problem. Pt denies any history of prior gait difficult or falls, denies any current or previous changes in strength, sensorium, no history or associated diziness, no LOC, no presyncope. Pt reports only mild improvement since onset. Pt was seen by PCP in office for this issue on 02/24/22 and referred to PT for referral. No imaging has been done, no referral to other provides.             There were no vitals filed for this visit.   TODAY's INTERVENTIONS: 07/09/22   Neuro Re-Ed:  STS + 25' gait + STS: x10 reps with 3# AW's. x10 additional laps with  bolsters in front of each chair as visual cue to initiate larger step lengths with gait initiation. CGA.  Ladder drills with 3# AW's throughout, CGA   Forwards 1 foot/square: x12 laps Lateral steps: x12 reps both feet/square.  2 feet in diagonals: x10   Alternating R/L airex pad step ups: x20/Side   Figure 8's: x12 laps. Min to mod VC's for step heights with turns around cones. Fair carryover.     Forwards/backwards resisted walking at Matrix: 7.5 lbs, 6 laps/direction. CGA. Intermittent stepping strategy to correct LOB with eccentric portions with forward walks.  Rationale for Evaluation and Treatment Rehabilitation  HOME EXERCISE PROGRAM:   Access Code: [email or web address] URL: [email or web address] Date: 06/09/2022 Prepared by: Sande Brothers  Exercises - Single Leg Stance  - 1 x daily - 7 x weekly - 5 sets - 5-10 sec hold - Tandem Stance  - 1 x daily - 7 x weekly - 5 sets - 5-10 sec hold    PT Short Term Goals - 03/25/22 1541       PT SHORT TERM GOAL #1   Title Pt to reports decrease of falls at home <1x/month.    Baseline eval: 2-3/week since onset; 05/19/2022= Patient reports only 1 fall in past 3 weeks. 06/09/2022= Patient denies any falls.    Time 4    Period Weeks    Status  MET   Target Date 04/22/22      PT SHORT TERM GOAL #2   Title Pt to report  successful use of SPC for resumption of AMB activity, rpeorts no longer limitng activity due to fear of falling.    Baseline eval: avoiding much wlaking due ot fear of falling; 05/19/2022= Patient reports walking shorter distances without cane and longer distances with cane ex: walking to mailbox.    Time 4    Period Weeks    Status GOAL MET   Target Date 04/22/22               PT Long Term Goals - 03/25/22 1543       PT LONG TERM GOAL #1   Title Pt to show improved FOTO score > 15 points to indicated improved feelings of confidence in balance    Baseline eval: 49; 05/19/2022= 44; 06/16/2022= 58%   Time 12   Period Weeks    Status ONGOING    Target Date 09/08/2022     PT LONG TERM GOAL #2   Title Pt to demonstrate 6MWT >1468f c LRAD without any LOB at modI level to indicate ability to safely perform limited community AMB.    Baseline eval: learning SPC use; 05/19/2022= 965 feet with no device- Initial shuffle but vastly improved with distance. 06/09/2022= 1130 feet without an assistive device. 06/15/2022= 1135 feet  without an assistive device.   Time 12   Period Weeks    Status ONGOING   Target Date 09/08/2022     PT LONG TERM GOAL #3   Title Pt to show Berg balance test score increase > 10 points to indicate reduced risk of falls.    Baseline defered to visit 2; 05/19/2022= 44/56; 06/09/2022= 46/56; 06/15/2022= 47/56   Time 12   Period Weeks    Status ONGOING   Target Date 09/08/2022           PT LONG TERM GOAL #4  Title Patient will improve 5 times sit to stand test of less than 15 seconds to indicate improved lower extremity power and decrease risk of falls.    Baseline 9/28: 19.02 seconds; 06/16/2022= 19.08 sec without UE support  Time 12  Period Weeks   Status ONGOING  Target Date 09/08/2022        Plan - 04/16/22 1035     Clinical Impression Statement Continuing PT POC with focus on gait mechanics. Pt demonstrates improved gait initiation with visual targets but  tends to return to shuffling gait with removal of visual cuing. Pt does improve with step lengths with ladder drills with increased repetition. Trialing high repetition in attempts to improve motor learning and improved outside session carryover.  Patient will continue to benefit from skilled physical therapy intervention to address impairments, improve QOL, and attain therapy goals.    Examination-Activity Limitations Locomotion Level;Carry    Examination-Participation Restrictions Community Activity;Cleaning    Stability/Clinical Decision Making Stable/Uncomplicated    Clinical Decision Making High    Rehab Potential Good    PT Frequency 2x / week    PT Duration 8 weeks    PT Treatment/Interventions DME Instruction;Gait training;Stair training;Functional mobility training;Therapeutic activities;Therapeutic exercise;Balance training;Neuromuscular re-education;Patient/family education    PT Next Visit Plan Review [facility] training and continue balance training.   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[place]) 05/30/2020   Anxiety 05/30/2020   Erectile dysfunction 05/30/2020   History of avoidant personality disorder 05/30/2020   History of dysthymic disorder 05/30/2020   History of suicide attempt 05/30/2020   Hypothyroidism, acquired 05/30/2020   Diet-controlled type 2 diabetes mellitus ([place]) 11/30/2016   Chronic insomnia 08/25/2016   COPD (chronic obstructive pulmonary disease) ([place]) 04/03/2016    Alcohol use disorder, mild, abuse 02/27/2016   Severe recurrent major depression with psychotic features ([place]) 02/27/2016   Suicidal ideation 02/03/2016   Body mass index (BMI) of 32.0-32.9 in adult 01/23/2016   Fatty liver 10/24/2015   Benign prostatic hyperplasia with lower urinary tract symptoms 04/08/2015   GERD (gastroesophageal reflux disease) 02/19/2015   Essential hypertension 02/18/2015   Restless legs syndrome 02/18/2015    Salem Caster. Fairly IV, PT, DPT Physical Therapist- [facility]  07/09/2022, 12:55 PM

## 2022-07-14 ENCOUNTER — Ambulatory Visit: Payer: 59 | Admitting: Physical Therapy

## 2022-07-14 ENCOUNTER — Encounter: Payer: Self-pay | Admitting: Physical Therapy

## 2022-07-14 DIAGNOSIS — R2681 Unsteadiness on feet: Secondary | ICD-10-CM

## 2022-07-14 DIAGNOSIS — R269 Unspecified abnormalities of gait and mobility: Secondary | ICD-10-CM

## 2022-07-14 DIAGNOSIS — R296 Repeated falls: Secondary | ICD-10-CM

## 2022-07-14 DIAGNOSIS — R262 Difficulty in walking, not elsewhere classified: Secondary | ICD-10-CM | POA: Diagnosis not present

## 2022-07-14 NOTE — Therapy (Signed)
[place] MAIN [facility] SERVICES [address] [place], [place], [address] Phone: [phone]   Fax:  [phone]  Physical Therapy Treatment     Patient Details  Name: Mathew Aguilar MRN: [id number] Date of Birth: 02-28-1951 Referring Provider (PT): Dr. Harrel Lemon (PCP)   Encounter Date: 07/14/2022   PT End of Session - 07/14/22 1721     Visit Number 26    Number of Visits 42    Date for PT Re-Evaluation 09/08/22    Authorization Type UHC Medicare, Medicaid secondary    Authorization Time Period 03/25/22-06/17/22; Recert 66/10/9474-54/65/0354    Progress Note Due on Visit 30    PT Start Time 1430    PT Stop Time 1513    PT Time Calculation (min) 43 min    Equipment Utilized During Treatment Gait belt    Activity Tolerance Patient tolerated treatment well    Behavior During Therapy WFL for tasks assessed/performed                       Past Medical History:  Diagnosis Date   Anxiety    Asthma    Borderline personality disorder ([place])    Depression    Dysrhythmia    Erectile dysfunction    Fatty liver    GERD (gastroesophageal reflux disease)    Hypertension    Hypothyroidism    Pulmonary nodule, right    Restless leg    Sleep apnea    Suicide attempt [facility])     Past Surgical History:  Procedure Laterality Date   AMPUTATION ARM     COLONOSCOPY     COLONOSCOPY WITH PROPOFOL N/A 08/22/2018   Procedure: COLONOSCOPY WITH PROPOFOL;  Surgeon: Lollie Sails, MD;  Location: [facility] ENDOSCOPY;  Service: Endoscopy;  Laterality: N/A;    Subjective Assessment -    Subjective Pt reports doing well with no falls or LOB since last session.    Pertinent History Mathew Aguilar is a 77yoM who comes to [facility] OPPT neuro referred by PCP after acute insidious onset difficulty with walking that began roughly 4 weeks ago upon waking that morning. Pt reports his issue to solely be related to difficulty with inititation of gait. Pt  reports several falls assicated with this new problem. Pt denies any history of prior gait difficult or falls, denies any current or previous changes in strength, sensorium, no history or associated diziness, no LOC, no presyncope. Pt reports only mild improvement since onset. Pt was seen by PCP in office for this issue on 02/24/22 and referred to PT for referral. No imaging has been done, no referral to other provides.             There were no vitals filed for this visit.   TODAY's INTERVENTIONS: 07/14/22   Neuro Re-Ed:  STS 10 feet walk STS 2 x 10 with rest break between.   Ladder drills with 3# AW's throughout, CGA   Forwards 1 foot/square: x12 laps Lateral steps: x12 reps both feet/square.   Cone turns in box with focus on pivot turn ( 90 degree pivot turns) x 5 laps ea direction  Progressed to triangle to target shuffle walking, no shuffling noted with concentration but does have occasional shuffling with walking straight line between cones as if it is anticipatory.   Figure 8's: x12 laps. Min to mod VC's for step heights with turns around cones. Fair carryover.    Forwards/backwards resisted walking at Matrix: 7.5  lbs, 6 laps/direction. CGA. Intermittent stepping strategy to correct LOB with eccentric portions with forward walks.      Rationale for Evaluation and Treatment Rehabilitation  HOME EXERCISE PROGRAM:   Access Code: [email or web address] URL: [email or web address] Date: 06/09/2022 Prepared by: Sande Brothers  Exercises - Single Leg Stance  - 1 x daily - 7 x weekly - 5 sets - 5-10 sec hold - Tandem Stance  - 1 x daily - 7 x weekly - 5 sets - 5-10 sec hold    PT Short Term Goals - 03/25/22 1541       PT SHORT TERM GOAL #1   Title Pt to reports decrease of falls at home <1x/month.    Baseline eval: 2-3/week since onset; 05/19/2022= Patient reports only 1 fall in past 3 weeks. 06/09/2022= Patient denies any falls.    Time 4    Period Weeks    Status   MET   Target Date 04/22/22      PT SHORT TERM GOAL #2   Title Pt to report successful use of SPC for resumption of AMB activity, rpeorts no longer limitng activity due to fear of falling.    Baseline eval: avoiding much wlaking due ot fear of falling; 05/19/2022= Patient reports walking shorter distances without cane and longer distances with cane ex: walking to mailbox.    Time 4    Period Weeks    Status GOAL MET   Target Date 04/22/22               PT Long Term Goals - 03/25/22 1543       PT LONG TERM GOAL #1   Title Pt to show improved FOTO score > 15 points to indicated improved feelings of confidence in balance    Baseline eval: 49; 05/19/2022= 44; 06/16/2022= 58%   Time 12   Period Weeks    Status ONGOING    Target Date 09/08/2022     PT LONG TERM GOAL #2   Title Pt to demonstrate 6MWT >1467f c LRAD without any LOB at modI level to indicate ability to safely perform limited community AMB.    Baseline eval: learning SPC use; 05/19/2022= 965 feet with no device- Initial shuffle but vastly improved with distance. 06/09/2022= 1130 feet without an assistive device. 06/15/2022= 1135 feet  without an assistive device.   Time 12   Period Weeks    Status ONGOING   Target Date 09/08/2022     PT LONG TERM GOAL #3   Title Pt to show Berg balance test score increase > 10 points to indicate reduced risk of falls.    Baseline defered to visit 2; 05/19/2022= 44/56; 06/09/2022= 46/56; 06/15/2022= 47/56   Time 12   Period Weeks    Status ONGOING   Target Date 09/08/2022           PT LONG TERM GOAL #4  Title Patient will improve 5 times sit to stand test of less than 15 seconds to indicate improved lower extremity power and decrease risk of falls.    Baseline 9/28: 19.02 seconds; 06/16/2022= 19.08 sec without UE support  Time 12  Period Weeks   Status ONGOING  Target Date 09/08/2022        Plan - 04/16/22 1035     Clinical Impression Statement Continued with current plan of  care as laid out in evaluation and recent prior sessions. Pt remains motivated to advance progress toward goals in order to maximize independence and safety at home. Pt  requires high level assistance and cuing for completion of exercises in order to provide adequate level of stimulation and perturbation. Author allows pt as much opportunity as possible to perform independent righting strategies, only stepping in when pt is unable to prevent falling to floor. Pt closely monitored throughout session for safe vitals response and to maximize patient safety during interventions. Pt continues to demonstrate progress toward goals AEB progression of some interventions this date either in volume or intensity.    Examination-Activity Limitations Locomotion Level;Carry    Examination-Participation Restrictions Community Activity;Cleaning    Stability/Clinical Decision Making Stable/Uncomplicated    Clinical Decision Making High    Rehab Potential Good    PT Frequency 2x / week    PT Duration 8 weeks    PT Treatment/Interventions DME Instruction;Gait training;Stair training;Functional mobility training;Therapeutic activities;Therapeutic exercise;Balance training;Neuromuscular re-education;Patient/family education    PT Next Visit Plan Review [facility] training and continue balance training.   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[place]) 05/30/2020   Anxiety 05/30/2020   Erectile dysfunction 05/30/2020   History of  avoidant personality disorder 05/30/2020   History of dysthymic disorder 05/30/2020   History of suicide attempt 05/30/2020   Hypothyroidism, acquired 05/30/2020   Diet-controlled type 2 diabetes mellitus ([place]) 11/30/2016   Chronic insomnia 08/25/2016   COPD (chronic obstructive pulmonary disease) ([place]) 04/03/2016   Alcohol use disorder, mild, abuse 02/27/2016   Severe recurrent major depression with psychotic features ([place]) 02/27/2016   Suicidal ideation 02/03/2016   Body mass index (BMI) of 32.0-32.9 in adult 01/23/2016   Fatty liver 10/24/2015   Benign prostatic hyperplasia with lower urinary tract symptoms 04/08/2015   GERD (gastroesophageal reflux disease) 02/19/2015   Essential hypertension 02/18/2015   Restless legs syndrome 02/18/2015    Particia Lather PT  07/14/2022, 5:25 PM

## 2022-07-16 ENCOUNTER — Ambulatory Visit: Payer: 59 | Attending: Internal Medicine | Admitting: Physical Therapy

## 2022-07-16 DIAGNOSIS — R2681 Unsteadiness on feet: Secondary | ICD-10-CM | POA: Diagnosis present

## 2022-07-16 DIAGNOSIS — R269 Unspecified abnormalities of gait and mobility: Secondary | ICD-10-CM | POA: Diagnosis present

## 2022-07-16 DIAGNOSIS — R296 Repeated falls: Secondary | ICD-10-CM | POA: Insufficient documentation

## 2022-07-16 DIAGNOSIS — R262 Difficulty in walking, not elsewhere classified: Secondary | ICD-10-CM | POA: Insufficient documentation

## 2022-07-16 NOTE — Therapy (Signed)
[place] MAIN [facility] SERVICES [address] [place], [place], [address] Phone: [phone]   Fax:  [phone]  Physical Therapy Treatment     Patient Details  Name: Mathew Aguilar MRN: [id number] Date of Birth: 1951/03/08 Referring Provider (PT): Dr. Harrel Lemon (PCP)   Encounter Date: 07/16/2022   PT End of Session - 07/16/22 1154     Visit Number 27    Number of Visits 42    Date for PT Re-Evaluation 09/08/22    Authorization Type UHC Medicare, Medicaid secondary    Authorization Time Period 03/25/22-06/17/22; Recert 61/02/[id number]/[id number]/6948    Progress Note Due on Visit 30    PT Start Time 1145    PT Stop Time 1225    PT Time Calculation (min) 40 min    Equipment Utilized During Treatment Gait belt    Activity Tolerance Patient tolerated treatment well    Behavior During Therapy WFL for tasks assessed/performed                        Past Medical History:  Diagnosis Date   Anxiety    Asthma    Borderline personality disorder ([place])    Depression    Dysrhythmia    Erectile dysfunction    Fatty liver    GERD (gastroesophageal reflux disease)    Hypertension    Hypothyroidism    Pulmonary nodule, right    Restless leg    Sleep apnea    Suicide attempt [facility])     Past Surgical History:  Procedure Laterality Date   AMPUTATION ARM     COLONOSCOPY     COLONOSCOPY WITH PROPOFOL N/A 08/22/2018   Procedure: COLONOSCOPY WITH PROPOFOL;  Surgeon: Lollie Sails, MD;  Location: [facility] ENDOSCOPY;  Service: Endoscopy;  Laterality: N/A;    Subjective Assessment -    Subjective Pt reports doing well with no falls or LOB since last session.    Pertinent History Mathew Aguilar is a 59yoM who comes to [facility] OPPT neuro referred by PCP after acute insidious onset difficulty with walking that began roughly 4 weeks ago upon waking that morning. Pt reports his issue to solely be related to difficulty with inititation of gait.  Pt reports several falls assicated with this new problem. Pt denies any history of prior gait difficult or falls, denies any current or previous changes in strength, sensorium, no history or associated diziness, no LOC, no presyncope. Pt reports only mild improvement since onset. Pt was seen by PCP in office for this issue on 02/24/22 and referred to PT for referral. No imaging has been done, no referral to other provides.             There were no vitals filed for this visit.   TODAY's INTERVENTIONS: 07/16/22   Neuro Re-Ed:  STS 10 feet walk STS 2 x 10 with rest break between.   Ladder drills with 3# AW's throughout, CGA   Forwards 1 foot/square: x12 laps Lateral steps: x12 reps both feet/square.   Balance obstacle course with tight turns around obstacles with cones as boundary/turn markers, step up/ down for curb simulation and with stepping over 1/2 bolsters x 6 laps clock and counterclockwise to challenge turning both directions. Occasional anticipatory shuffling/ stutter of steps but this improved with practice. 3# Aw donned throughout   Side stepping over 1/2 foam roller with 3# AW, pt unable to mentally process spacing needed for adequate lateral stepping despite multimodal  cues (tactile, visual, autitory, etc but pt continues to attempt cross over step pattern causing stumble and near fall. Pt able to copmlete on a few reps with slowing down of movement. This demonstrates severe frontal plane abnormalities in balance and stepping reactions.   Forwards/backwards/bilateral resisted walking at Matrix: 7.5 lbs, 3 laps/direction. CGA. Intermittent stepping strategy to correct LOB with eccentric portions with forward walks and with left  lateral stepping on initial phase of concentric challenge of exercise     Rationale for Evaluation and Treatment Rehabilitation  HOME EXERCISE PROGRAM:   Access Code: [facility] URL: [email or web address] Date: 06/09/2022 Prepared  by: Sande Brothers  Exercises - Single Leg Stance  - 1 x daily - 7 x weekly - 5 sets - 5-10 sec hold - Tandem Stance  - 1 x daily - 7 x weekly - 5 sets - 5-10 sec hold    PT Short Term Goals - 03/25/22 1541       PT SHORT TERM GOAL #1   Title Pt to reports decrease of falls at home <1x/month.    Baseline eval: 2-3/week since onset; 05/19/2022= Patient reports only 1 fall in past 3 weeks. 06/09/2022= Patient denies any falls.    Time 4    Period Weeks    Status  MET   Target Date 04/22/22      PT SHORT TERM GOAL #2   Title Pt to report successful use of SPC for resumption of AMB activity, rpeorts no longer limitng activity due to fear of falling.    Baseline eval: avoiding much wlaking due ot fear of falling; 05/19/2022= Patient reports walking shorter distances without cane and longer distances with cane ex: walking to mailbox.    Time 4    Period Weeks    Status GOAL MET   Target Date 04/22/22               PT Long Term Goals - 03/25/22 1543       PT LONG TERM GOAL #1   Title Pt to show improved FOTO score > 15 points to indicated improved feelings of confidence in balance    Baseline eval: 49; 05/19/2022= 44; 06/16/2022= 58%   Time 12   Period Weeks    Status ONGOING    Target Date 09/08/2022     PT LONG TERM GOAL #2   Title Pt to demonstrate 6MWT >1426f c LRAD without any LOB at modI level to indicate ability to safely perform limited community AMB.    Baseline eval: learning SPC use; 05/19/2022= 965 feet with no device- Initial shuffle but vastly improved with distance. 06/09/2022= 1130 feet without an assistive device. 06/15/2022= 1135 feet  without an assistive device.   Time 12   Period Weeks    Status ONGOING   Target Date 09/08/2022     PT LONG TERM GOAL #3   Title Pt to show Berg balance test score increase > 10 points to indicate reduced risk of falls.    Baseline defered to visit 2; 05/19/2022= 44/56; 06/09/2022= 46/56; 06/15/2022= 47/56   Time 12   Period  Weeks    Status ONGOING   Target Date 09/08/2022           PT LONG TERM GOAL #4  Title Patient will improve 5 times sit to stand test of less than 15 seconds to indicate improved lower extremity power and decrease risk of falls.    Baseline 9/28: 19.02 seconds; 06/16/2022= 19.08 sec without UE support  Time 12  Period Weeks   Status ONGOING  Target Date 09/08/2022        Plan - 04/16/22 1035     Clinical Impression Statement Continued with current plan of care as laid out in evaluation and recent prior sessions. Pt remains motivated to advance progress toward goals in order to maximize independence and safety at home. Pt requires high level assistance and cuing for completion of exercises in order to provide adequate level of stimulation and perturbation. Author allows pt as much opportunity as possible to perform independent righting strategies, only stepping in when pt is unable to prevent falling to floor. Pt closely monitored throughout session for safe vitals response and to maximize patient safety during interventions. Pt continues to demonstrate progress toward goals AEB progression of some interventions this date either in volume or intensity.    Examination-Activity Limitations Locomotion Level;Carry    Examination-Participation Restrictions Community Activity;Cleaning    Stability/Clinical Decision Making Stable/Uncomplicated    Clinical Decision Making High    Rehab Potential Good    PT Frequency 2x / week    PT Duration 8 weeks    PT Treatment/Interventions DME Instruction;Gait training;Stair training;Functional mobility training;Therapeutic activities;Therapeutic exercise;Balance training;Neuromuscular re-education;Patient/family education    PT Next Visit Plan Review [facility] training and continue balance training.   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) 04/03/2016   Alcohol use disorder, mild, abuse 02/27/2016   Severe recurrent major depression with psychotic features ([place]) 02/27/2016   Suicidal ideation 02/03/2016   Body mass index (BMI) of 32.0-32.9 in adult 01/23/2016   Fatty liver 10/24/2015   Benign prostatic hyperplasia with lower urinary tract symptoms 04/08/2015   GERD (gastroesophageal reflux disease) 02/19/2015   Essential hypertension 02/18/2015   Restless legs syndrome 02/18/2015    Particia Lather PT  07/16/2022, 2:31 PM

## 2022-07-21 ENCOUNTER — Ambulatory Visit: Payer: 59

## 2022-07-21 DIAGNOSIS — R262 Difficulty in walking, not elsewhere classified: Secondary | ICD-10-CM

## 2022-07-21 DIAGNOSIS — R2681 Unsteadiness on feet: Secondary | ICD-10-CM

## 2022-07-21 DIAGNOSIS — R269 Unspecified abnormalities of gait and mobility: Secondary | ICD-10-CM

## 2022-07-21 DIAGNOSIS — R296 Repeated falls: Secondary | ICD-10-CM

## 2022-07-21 NOTE — Therapy (Signed)
[place] MAIN [facility] SERVICES [address] [place], [place], [address] Phone: [phone]   Fax:  [phone]  Physical Therapy Treatment     Patient Details  Name: Mathew Aguilar MRN: [id number] Date of Birth: 02/24/1951 Referring Provider (PT): Dr. Harrel Lemon (PCP)   Encounter Date: 07/21/2022   PT End of Session - 07/21/22 0738     Visit Number 28    Number of Visits 42    Date for PT Re-Evaluation 09/08/22    Authorization Type UHC Medicare, Medicaid secondary    Authorization Time Period 03/25/22-06/17/22; Recert [id number]    Progress Note Due on Visit 30    PT Start Time 0743    PT Stop Time 0829    PT Time Calculation (min) 46 min    Equipment Utilized During Treatment Gait belt    Activity Tolerance Patient tolerated treatment well    Behavior During Therapy WFL for tasks assessed/performed                        Past Medical History:  Diagnosis Date   Anxiety    Asthma    Borderline personality disorder ([place])    Depression    Dysrhythmia    Erectile dysfunction    Fatty liver    GERD (gastroesophageal reflux disease)    Hypertension    Hypothyroidism    Pulmonary nodule, right    Restless leg    Sleep apnea    Suicide attempt [facility])     Past Surgical History:  Procedure Laterality Date   AMPUTATION ARM     COLONOSCOPY     COLONOSCOPY WITH PROPOFOL N/A 08/22/2018   Procedure: COLONOSCOPY WITH PROPOFOL;  Surgeon: Lollie Sails, MD;  Location: [facility] ENDOSCOPY;  Service: Endoscopy;  Laterality: N/A;    Subjective Assessment -    Subjective Patient reports doing okay- always unsteady getting started in the morning. States not using his cane much.    Pertinent History Mathew Aguilar is a 37yoM who comes to [facility] OPPT neuro referred by PCP after acute insidious onset difficulty with walking that began roughly 4 weeks ago upon waking that morning. Pt reports his issue to solely be  related to difficulty with inititation of gait. Pt reports several falls assicated with this new problem. Pt denies any history of prior gait difficult or falls, denies any current or previous changes in strength, sensorium, no history or associated diziness, no LOC, no presyncope. Pt reports only mild improvement since onset. Pt was seen by PCP in office for this issue on 02/24/22 and referred to PT for referral. No imaging has been done, no referral to other provides.             There were no vitals filed for this visit.   TODAY's INTERVENTIONS: 07/21/22   Neuro Re-Ed:   In // bars unless otherwise stated with CGA and use of gait belt  Step tap onto 6" block without UE support (for coordination) x 20 reps alt LE- with 3lb AW  Sit to stand combo activity with walking in // bars with 3lb AW up/over 6" block and walk down to end of bar - turn 180 deg- walk back - over block then sit back down x 5.    Balance obstacle course in and outside of // bars- consisiting of forward step over 1/2 foams x 4 in // bars then outside of bars (walking narrowed path between 2 balance  beams- almost tandem) then up onto blue airex pad then up/over green therapad then walk back to seat - 3lb AW x 5   Balance obstacle course in and outside of // bars- consisiting of side step over 1/2 foams x 4 in // bars then outside of bars (walking wide base of support splitting airex beam) then up onto blue airex pad then up/over green therapad then walk back to seat - 3lb AW x 5 - Initial anticipatory shuffling observed when arriving to new obstacle- but did improve with practice. Patient was able to perform side steps with VC only today for wider steps.   Heel to toe activity using matrix cable system- 7.5 #- x 15 reps each LE (holding onto back of chair with left UE)     Forwards/backwards/bilateral resisted walking at Matrix: 7.5 lbs, 3 laps/direction. CGA. Intermittent stepping strategy to correct LOB with eccentric  portions with forward walks and with left  lateral stepping on initial phase of concentric challenge of exercise  6 min walk= 1350 feet without an AD    Rationale for Evaluation and Treatment Rehabilitation  HOME EXERCISE PROGRAM:   Access Code: [email or web address] URL: [email or web address] Date: 06/09/2022 Prepared by: Sande Brothers  Exercises - Single Leg Stance  - 1 x daily - 7 x weekly - 5 sets - 5-10 sec hold - Tandem Stance  - 1 x daily - 7 x weekly - 5 sets - 5-10 sec hold    PT Short Term Goals - 03/25/22 1541       PT SHORT TERM GOAL #1   Title Pt to reports decrease of falls at home <1x/month.    Baseline eval: 2-3/week since onset; 05/19/2022= Patient reports only 1 fall in past 3 weeks. 06/09/2022= Patient denies any falls.    Time 4    Period Weeks    Status  MET   Target Date 04/22/22      PT SHORT TERM GOAL #2   Title Pt to report successful use of SPC for resumption of AMB activity, rpeorts no longer limitng activity due to fear of falling.    Baseline eval: avoiding much wlaking due ot fear of falling; 05/19/2022= Patient reports walking shorter distances without cane and longer distances with cane ex: walking to mailbox.    Time 4    Period Weeks    Status GOAL MET   Target Date 04/22/22               PT Long Term Goals - 03/25/22 1543       PT LONG TERM GOAL #1   Title Pt to show improved FOTO score > 15 points to indicated improved feelings of confidence in balance    Baseline eval: 49; 05/19/2022= 44; 06/16/2022= 58%   Time 12   Period Weeks    Status ONGOING    Target Date 09/08/2022     PT LONG TERM GOAL #2   Title Pt to demonstrate 6MWT >1410f c LRAD without any LOB at modI level to indicate ability to safely perform limited community AMB.    Baseline eval: learning SPC use; 05/19/2022= 965 feet with no device- Initial shuffle but vastly improved with distance. 06/09/2022= 1130 feet without an assistive device. 06/15/2022= 1135 feet   without an assistive device.; 07/21/2022= 1350 feet without an assistive device.    Time 12   Period Weeks    Status ONGOING   Target Date 09/08/2022     PT LONG TERM GOAL #3  Title Pt to show Berg balance test score increase > 10 points to indicate reduced risk of falls.    Baseline defered to visit 2; 05/19/2022= 44/56; 06/09/2022= 46/56; 06/15/2022= 47/56   Time 12   Period Weeks    Status ONGOING   Target Date 09/08/2022           PT LONG TERM GOAL #4  Title Patient will improve 5 times sit to stand test of less than 15 seconds to indicate improved lower extremity power and decrease risk of falls.    Baseline 9/28: 19.02 seconds; 06/16/2022= 19.08 sec without UE support  Time 12  Period Weeks   Status ONGOING  Target Date 09/08/2022        Plan - 04/16/22 1035     Clinical Impression Statement Patient presents with good motivation- early for visit and no complaints other than moving slow this morning. He warmed up quickly with step taps and later several versions of balance obstacle course activities. He required less multimodal cues today for balance and responded well to introduction of resistive heel to toe gait focusing on taking a longer step. He then applied those larger steps during a subsequent 6 min walk test and performed 200 more feet than previously measured.   Pt closely monitored throughout session for safe vitals response and to maximize patient safety during interventions. Patient will continue to benefit from skilled physical therapy intervention to address impairments, improve QOL, and attain therapy goals    Examination-Activity Limitations Locomotion Level;Carry    Examination-Participation Restrictions Community Activity;Cleaning    Stability/Clinical Decision Making Stable/Uncomplicated    Clinical Decision Making High    Rehab Potential Good    PT Frequency 2x / week    PT Duration 8 weeks    PT Treatment/Interventions DME Instruction;Gait training;Stair  training;Functional mobility training;Therapeutic activities;Therapeutic exercise;Balance training;Neuromuscular re-education;Patient/family education    PT Next Visit Plan Review [facility] training and continue balance training.   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[place]) 05/30/2020   Anxiety 05/30/2020   Erectile dysfunction 05/30/2020   History of avoidant personality disorder 05/30/2020   History of dysthymic disorder 05/30/2020   History of suicide attempt 05/30/2020   Hypothyroidism, acquired 05/30/2020   Diet-controlled type 2 diabetes mellitus ([place]) 11/30/2016   Chronic insomnia 08/25/2016   COPD (chronic obstructive pulmonary disease) ([place]) 04/03/2016   Alcohol use disorder, mild, abuse 02/27/2016   Severe recurrent major depression with psychotic features ([place]) 02/27/2016   Suicidal ideation 02/03/2016   Body mass index (BMI) of 32.0-32.9 in adult 01/23/2016   Fatty liver 10/24/2015   Benign prostatic hyperplasia with lower urinary tract symptoms 04/08/2015   GERD (gastroesophageal reflux disease) 02/19/2015   Essential hypertension 02/18/2015   Restless legs syndrome 02/18/2015    Kathlee Nations Kambrea Carrasco PT  07/21/2022, 8:37 AM

## 2022-07-23 ENCOUNTER — Ambulatory Visit: Payer: 59 | Admitting: Physical Therapy

## 2022-07-23 NOTE — Therapy (Deleted)
[place] MAIN [facility] SERVICES [address] [place], [place], [address] Phone: [phone]   Fax:  [phone]  Physical Therapy Treatment     Patient Details  Name: Mathew Aguilar MRN: [id number] Date of Birth: 04/12/1951 Referring Provider (PT): Dr. Harrel Lemon (PCP)   Encounter Date: 07/23/2022                Past Medical History:  Diagnosis Date   Anxiety    Asthma    Borderline personality disorder [facility])    Depression    Dysrhythmia    Erectile dysfunction    Fatty liver    GERD (gastroesophageal reflux disease)    Hypertension    Hypothyroidism    Pulmonary nodule, right    Restless leg    Sleep apnea    Suicide attempt [facility])     Past Surgical History:  Procedure Laterality Date   AMPUTATION ARM     COLONOSCOPY     COLONOSCOPY WITH PROPOFOL N/A 08/22/2018   Procedure: COLONOSCOPY WITH PROPOFOL;  Surgeon: Lollie Sails, MD;  Location: [facility] ENDOSCOPY;  Service: Endoscopy;  Laterality: N/A;    Subjective Assessment -    Subjective Patient reports doing okay- always unsteady getting started in the morning. States not using his cane much.    Pertinent History Mathew Aguilar is a 50yoM who comes to [facility] OPPT neuro referred by PCP after acute insidious onset difficulty with walking that began roughly 4 weeks ago upon waking that morning. Pt reports his issue to solely be related to difficulty with inititation of gait. Pt reports several falls assicated with this new problem. Pt denies any history of prior gait difficult or falls, denies any current or previous changes in strength, sensorium, no history or associated diziness, no LOC, no presyncope. Pt reports only mild improvement since onset. Pt was seen by PCP in office for this issue on 02/24/22 and referred to PT for referral. No imaging has been done, no referral to other provides.             There were no vitals filed for this visit.   TODAY's  INTERVENTIONS: 07/23/22   Neuro Re-Ed:   In // bars unless otherwise stated with CGA and use of gait belt  Step tap onto 6" block without UE support (for coordination) x 20 reps alt LE- with 3lb AW  Sit to stand combo activity with walking in // bars with 3lb AW up/over 6" block and walk down to end of bar - turn 180 deg- walk back - over block then sit back down x 5.    Balance obstacle course in and outside of // bars- consisiting of forward step over 1/2 foams x 4 in // bars then outside of bars (walking narrowed path between 2 balance beams- almost tandem) then up onto blue airex pad then up/over green therapad then walk back to seat - 3lb AW x 5   Balance obstacle course in and outside of // bars- consisiting of side step over 1/2 foams x 4 in // bars then outside of bars (walking wide base of support splitting airex beam) then up onto blue airex pad then up/over green therapad then walk back to seat - 3lb AW x 5 - Initial anticipatory shuffling observed when arriving to new obstacle- but did improve with practice. Patient was able to perform side steps with VC only today for wider steps.   Heel to toe activity using matrix cable system-  7.5 #- x 15 reps each LE (holding onto back of chair with left UE)   Forwards/backwards/bilateral resisted walking at Matrix: 7.5 lbs, 3 laps/direction. CGA. Intermittent stepping strategy to correct LOB with eccentric portions with forward walks and with left  lateral stepping on initial phase of concentric challenge of exercise  6 min walk= 1350 feet without an AD    Rationale for Evaluation and Treatment Rehabilitation  HOME EXERCISE PROGRAM:   Access Code: [email or web address] URL: [email or web address] Date: 06/09/2022 Prepared by: Sande Brothers  Exercises - Single Leg Stance  - 1 x daily - 7 x weekly - 5 sets - 5-10 sec hold - Tandem Stance  - 1 x daily - 7 x weekly - 5 sets - 5-10 sec hold    PT Short Term Goals - 03/25/22 1541        PT SHORT TERM GOAL #1   Title Pt to reports decrease of falls at home <1x/month.    Baseline eval: 2-3/week since onset; 05/19/2022= Patient reports only 1 fall in past 3 weeks. 06/09/2022= Patient denies any falls.    Time 4    Period Weeks    Status  MET   Target Date 04/22/22      PT SHORT TERM GOAL #2   Title Pt to report successful use of SPC for resumption of AMB activity, rpeorts no longer limitng activity due to fear of falling.    Baseline eval: avoiding much wlaking due ot fear of falling; 05/19/2022= Patient reports walking shorter distances without cane and longer distances with cane ex: walking to mailbox.    Time 4    Period Weeks    Status GOAL MET   Target Date 04/22/22               PT Long Term Goals - 03/25/22 1543       PT LONG TERM GOAL #1   Title Pt to show improved FOTO score > 15 points to indicated improved feelings of confidence in balance    Baseline eval: 49; 05/19/2022= 44; 06/16/2022= 58%   Time 12   Period Weeks    Status ONGOING    Target Date 09/08/2022     PT LONG TERM GOAL #2   Title Pt to demonstrate 6MWT >1470f c LRAD without any LOB at modI level to indicate ability to safely perform limited community AMB.    Baseline eval: learning SPC use; 05/19/2022= 965 feet with no device- Initial shuffle but vastly improved with distance. 06/09/2022= 1130 feet without an assistive device. 06/15/2022= 1135 feet  without an assistive device.; 07/21/2022= 1350 feet without an assistive device.    Time 12   Period Weeks    Status ONGOING   Target Date 09/08/2022     PT LONG TERM GOAL #3   Title Pt to show Berg balance test score increase > 10 points to indicate reduced risk of falls.    Baseline defered to visit 2; 05/19/2022= 44/56; 06/09/2022= 46/56; 06/15/2022= 47/56   Time 12   Period Weeks    Status ONGOING   Target Date 09/08/2022           PT LONG TERM GOAL #4  Title Patient will improve 5 times sit to stand test of less than 15 seconds to  indicate improved lower extremity power and decrease risk of falls.    Baseline 9/28: 19.02 seconds; 06/16/2022= 19.08 sec without UE support  Time 12  Period Weeks   Status ONGOING  Target Date  09/08/2022        Plan - 04/16/22 1035     Clinical Impression Statement Patient presents with good motivation.  Pt closely monitored throughout session for safe vitals response and to maximize patient safety during interventions. Patient will continue to benefit from skilled physical therapy intervention to address impairments, improve QOL, and attain therapy goals    Examination-Activity Limitations Locomotion Level;Carry    Examination-Participation Restrictions Community Activity;Cleaning    Stability/Clinical Decision Making Stable/Uncomplicated    Clinical Decision Making High    Rehab Potential Good    PT Frequency 2x / week    PT Duration 8 weeks    PT Treatment/Interventions DME Instruction;Gait training;Stair training;Functional mobility training;Therapeutic activities;Therapeutic exercise;Balance training;Neuromuscular re-education;Patient/family education    PT Next Visit Plan Review [facility] training and continue balance training.   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[place]) 05/30/2020   Anxiety 05/30/2020   Erectile dysfunction 05/30/2020   History of avoidant personality disorder 05/30/2020   History of dysthymic disorder 05/30/2020   History of suicide attempt 05/30/2020    Hypothyroidism, acquired 05/30/2020   Diet-controlled type 2 diabetes mellitus ([place]) 11/30/2016   Chronic insomnia 08/25/2016   COPD (chronic obstructive pulmonary disease) ([place]) 04/03/2016   Alcohol use disorder, mild, abuse 02/27/2016   Severe recurrent major depression with psychotic features ([place]) 02/27/2016   Suicidal ideation 02/03/2016   Body mass index (BMI) of 32.0-32.9 in adult 01/23/2016   Fatty liver 10/24/2015   Benign prostatic hyperplasia with lower urinary tract symptoms 04/08/2015   GERD (gastroesophageal reflux disease) 02/19/2015   Essential hypertension 02/18/2015   Restless legs syndrome 02/18/2015    Particia Lather PT  07/23/2022, 9:13 AM

## 2022-07-28 ENCOUNTER — Ambulatory Visit: Payer: 59

## 2022-07-28 DIAGNOSIS — R296 Repeated falls: Secondary | ICD-10-CM

## 2022-07-28 DIAGNOSIS — R262 Difficulty in walking, not elsewhere classified: Secondary | ICD-10-CM | POA: Diagnosis not present

## 2022-07-28 DIAGNOSIS — R269 Unspecified abnormalities of gait and mobility: Secondary | ICD-10-CM

## 2022-07-28 DIAGNOSIS — R2681 Unsteadiness on feet: Secondary | ICD-10-CM

## 2022-07-28 NOTE — Therapy (Signed)
[place] MAIN [facility] SERVICES [address] [place], [place], [address] Phone: [phone]   Fax:  [phone]  Physical Therapy Treatment     Patient Details  Name: Mathew Aguilar MRN: [id number] Date of Birth: 1951/05/09 Referring Provider (PT): Dr. Harrel Lemon (PCP)   Encounter Date: 07/28/2022   PT End of Session - 07/28/22 0804     Visit Number 29    Number of Visits 42    Date for PT Re-Evaluation 09/08/22    Authorization Type UHC Medicare, Medicaid secondary    Authorization Time Period 03/25/22-06/17/22; Recert 13/10/4399-02/72/5366    Progress Note Due on Visit 30    PT Start Time 0800    PT Stop Time 0843    PT Time Calculation (min) 43 min    Equipment Utilized During Treatment Gait belt    Activity Tolerance Patient tolerated treatment well    Behavior During Therapy WFL for tasks assessed/performed                        Past Medical History:  Diagnosis Date   Anxiety    Asthma    Borderline personality disorder ([place])    Depression    Dysrhythmia    Erectile dysfunction    Fatty liver    GERD (gastroesophageal reflux disease)    Hypertension    Hypothyroidism    Pulmonary nodule, right    Restless leg    Sleep apnea    Suicide attempt [facility])     Past Surgical History:  Procedure Laterality Date   AMPUTATION ARM     COLONOSCOPY     COLONOSCOPY WITH PROPOFOL N/A 08/22/2018   Procedure: COLONOSCOPY WITH PROPOFOL;  Surgeon: Lollie Sails, MD;  Location: [facility] ENDOSCOPY;  Service: Endoscopy;  Laterality: N/A;    Subjective Assessment -    Subjective Patient reports been up for a while and became stiff in waiting room. States he was been walking more and reports no falls.   Pertinent History Mathew Aguilar is a 1yoM who comes to [facility] OPPT neuro referred by PCP after acute insidious onset difficulty with walking that began roughly 4 weeks ago upon waking that morning. Pt reports his issue  to solely be related to difficulty with inititation of gait. Pt reports several falls assicated with this new problem. Pt denies any history of prior gait difficult or falls, denies any current or previous changes in strength, sensorium, no history or associated diziness, no LOC, no presyncope. Pt reports only mild improvement since onset. Pt was seen by PCP in office for this issue on 02/24/22 and referred to PT for referral. No imaging has been done, no referral to other provides.             There were no vitals filed for this visit.   TODAY's INTERVENTIONS: 07/28/22   Neuro Re-Ed:   In // bars unless otherwise stated with CGA and use of gait belt  Dynamic warm up-   -high knees - down and back - repeat for 3 times  -ham curl (butt kicks) - repeat for 3 times down and back in // bars  -straight leg kicks -down and back for 3 times  -[place]ari[place]- down and back x 3 times. (Increased initial difficulty but much improved with practice)   Step tap onto 6" block without UE support (for coordination) x 20 reps alt LE- with 3lb AW  Walking in // bars (over hedgehogs placed  on right and left side- staggered to increase step length) x 5 - Very challenging - patient exhibits difficulty with appropriate step placement and sequencing.   Sit to stand combo activity with walking in // bars with 3lb AW up/over (1/2 foam rolls x 4) and walk down to end of bar - turn 180 deg- walk back - over block then sit back down x 5.     Gait around cones- Single line 6 cones position approx 15 in apart- weaving around cones attempting to take larger step and not shuffle. Patient experienced several instances of "kicking" over cones but did improve after multiple attempts   Heel to toe activity using matrix cable system- 7.5 #- x 15 reps each LE (holding onto back of chair with left UE)       Rationale for Evaluation and Treatment Rehabilitation  HOME EXERCISE PROGRAM:   Access Code: [email or web address] URL:  [email or web address] Date: 06/09/2022 Prepared by: Sande Brothers  Exercises - Single Leg Stance  - 1 x daily - 7 x weekly - 5 sets - 5-10 sec hold - Tandem Stance  - 1 x daily - 7 x weekly - 5 sets - 5-10 sec hold    PT Short Term Goals - 03/25/22 1541       PT SHORT TERM GOAL #1   Title Pt to reports decrease of falls at home <1x/month.    Baseline eval: 2-3/week since onset; 05/19/2022= Patient reports only 1 fall in past 3 weeks. 06/09/2022= Patient denies any falls.    Time 4    Period Weeks    Status  MET   Target Date 04/22/22      PT SHORT TERM GOAL #2   Title Pt to report successful use of SPC for resumption of AMB activity, rpeorts no longer limitng activity due to fear of falling.    Baseline eval: avoiding much wlaking due ot fear of falling; 05/19/2022= Patient reports walking shorter distances without cane and longer distances with cane ex: walking to mailbox.    Time 4    Period Weeks    Status GOAL MET   Target Date 04/22/22               PT Long Term Goals - 03/25/22 1543       PT LONG TERM GOAL #1   Title Pt to show improved FOTO score > 15 points to indicated improved feelings of confidence in balance    Baseline eval: 49; 05/19/2022= 44; 06/16/2022= 58%   Time 12   Period Weeks    Status ONGOING    Target Date 09/08/2022     PT LONG TERM GOAL #2   Title Pt to demonstrate 6MWT >1419f c LRAD without any LOB at modI level to indicate ability to safely perform limited community AMB.    Baseline eval: learning SPC use; 05/19/2022= 965 feet with no device- Initial shuffle but vastly improved with distance. 06/09/2022= 1130 feet without an assistive device. 06/15/2022= 1135 feet  without an assistive device.; 07/21/2022= 1350 feet without an assistive device.    Time 12   Period Weeks    Status ONGOING   Target Date 09/08/2022     PT LONG TERM GOAL #3   Title Pt to show Berg balance test score increase > 10 points to indicate reduced risk of  falls.    Baseline defered to visit 2; 05/19/2022= 44/56; 06/09/2022= 46/56; 06/15/2022= 47/56   Time 12   Period Weeks  Status ONGOING   Target Date 09/08/2022           PT LONG TERM GOAL #4  Title Patient will improve 5 times sit to stand test of less than 15 seconds to indicate improved lower extremity power and decrease risk of falls.    Baseline 9/28: 19.02 seconds; 06/16/2022= 19.08 sec without UE support  Time 12  Period Weeks   Status ONGOING  Target Date 09/08/2022        Plan - 04/16/22 1035     Clinical Impression Statement Patient presents with excellent motivation for treatment - arriving early and agreeable to all prescribed activities today. He continues to be challenged with standing balance activities requiring coordinated movement around objects- Difficulty with sequencing initially yet if provided enough practice he performs well with less unsteadiness overall. He continues to be just unsteady enough for close CGA with all activities.  Patient will continue to benefit from skilled physical therapy intervention to address impairments, improve QOL, and attain therapy goals    Examination-Activity Limitations Locomotion Level;Carry    Examination-Participation Restrictions Community Activity;Cleaning    Stability/Clinical Decision Making Stable/Uncomplicated    Clinical Decision Making High    Rehab Potential Good    PT Frequency 2x / week    PT Duration 8 weeks    PT Treatment/Interventions DME Instruction;Gait training;Stair training;Functional mobility training;Therapeutic activities;Therapeutic exercise;Balance training;Neuromuscular re-education;Patient/family education    PT Next Visit Plan Review [facility] training and continue balance training.   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[place]) 05/30/2020   Anxiety 05/30/2020   Erectile dysfunction 05/30/2020   History of avoidant personality disorder 05/30/2020   History of dysthymic disorder 05/30/2020   History of suicide attempt 05/30/2020   Hypothyroidism, acquired 05/30/2020   Diet-controlled type 2 diabetes mellitus ([place]) 11/30/2016   Chronic insomnia 08/25/2016   COPD (chronic obstructive pulmonary disease) ([place]) 04/03/2016   Alcohol use disorder, mild, abuse 02/27/2016   Severe recurrent major depression with psychotic features ([place]) 02/27/2016   Suicidal ideation 02/03/2016   Body mass index (BMI) of 32.0-32.9 in adult 01/23/2016   Fatty liver 10/24/2015   Benign prostatic hyperplasia with lower urinary tract symptoms 04/08/2015   GERD (gastroesophageal reflux disease) 02/19/2015   Essential hypertension 02/18/2015   Restless legs syndrome 02/18/2015    Kathlee Nations Syrena Burges PT  07/28/2022, 4:28 PM

## 2022-07-30 ENCOUNTER — Ambulatory Visit: Payer: 59

## 2022-08-04 ENCOUNTER — Ambulatory Visit: Payer: 59

## 2022-08-04 DIAGNOSIS — R269 Unspecified abnormalities of gait and mobility: Secondary | ICD-10-CM

## 2022-08-04 DIAGNOSIS — R262 Difficulty in walking, not elsewhere classified: Secondary | ICD-10-CM | POA: Diagnosis not present

## 2022-08-04 DIAGNOSIS — R296 Repeated falls: Secondary | ICD-10-CM

## 2022-08-04 DIAGNOSIS — R2681 Unsteadiness on feet: Secondary | ICD-10-CM

## 2022-08-04 NOTE — Therapy (Signed)
Mathew Aguilar MAIN [facility] SERVICES [address] [place], [place], [address] Phone: [phone]   Fax:  [phone]  Physical Therapy Treatment /Physical Therapy Progress Note   Dates of reporting period  06/23/2022   to   08/04/2022    Patient Details  Name: Mathew Aguilar MRN: [id number] Date of Birth: [birth year] Referring Provider (PT): Dr. Harrel Lemon (PCP)   Encounter Date: 08/04/2022   PT End of Session - 08/04/22 0856     Visit Number 30    Number of Visits 42    Date for PT Re-Evaluation 09/08/22    Authorization Type UHC Medicare, Medicaid secondary    Authorization Time Period 03/25/22-06/17/22; Recert [id number]    Progress Note Due on Visit 33    PT Start Time 0845    PT Stop Time 0927    PT Time Calculation (min) 42 min    Equipment Utilized During Treatment Gait belt    Activity Tolerance Patient tolerated treatment well    Behavior During Therapy WFL for tasks assessed/performed                         Past Medical History:  Diagnosis Date   Anxiety    Asthma    Borderline personality disorder ([place])    Depression    Dysrhythmia    Erectile dysfunction    Fatty liver    GERD (gastroesophageal reflux disease)    Hypertension    Hypothyroidism    Pulmonary nodule, right    Restless leg    Sleep apnea    Suicide attempt [facility])     Past Surgical History:  Procedure Laterality Date   AMPUTATION ARM     COLONOSCOPY     COLONOSCOPY WITH PROPOFOL N/A 08/22/2018   Procedure: COLONOSCOPY WITH PROPOFOL;  Surgeon: Lollie Sails, MD;  Location: [facility] ENDOSCOPY;  Service: Endoscopy;  Laterality: N/A;    Subjective Assessment -    Subjective [name] States the MD told him he has a nerve condition that makes his legs weak.    Pertinent History Mathew Aguilar is a 18yoM who comes to [facility] OPP[facility] neuro referred by PCP after acute insidious onset  difficulty with walking that began roughly 4 weeks ago upon waking that morning. Pt reports his issue to solely be related to difficulty with inititation of gait. Pt reports several falls assicated with this new problem. Pt denies any history of prior gait difficult or falls, denies any current or previous changes in strength, sensorium, no history or associated diziness, no LOC, no presyncope. Pt reports only mild improvement since onset. Pt was seen by PCP in office for this issue on 02/24/22 and referred to PT for referral. No imaging has been done, no referral to other provides.             There were no vitals filed for this visit.   TODAY's INTERVENTIONS: 08/04/22  THEREX:   Seated hip march 4# AW alt LE x 15 reps each Seated knee ext 4# AW alt LE x 15 reps each  Retesting for progress note- 6 min walk test and 5 time Sit to Stand test- See goal section for details   Neuro Re-Ed:     BERG Balance test= 48/56    Rationale for Evaluation and Treatment Rehabilitation  HOME EXERCISE PROGRAM:   Access Code: [email or web address] URL: [email or web address] Date: 06/09/2022 Prepared by: Mathew Aguilar  Exercises - Single Leg Stance  - 1 x daily - 7 x weekly - 5 sets - 5-10 sec hold - Tandem Stance  - 1 x daily - 7 x weekly - 5 sets - 5-10 sec hold    PT Short Term Goals - 03/25/22 1541       PT SHORT TERM GOAL #1   Title Pt to report decrease of falls at home <1x/month.    Baseline eval: 2-3/week since onset; 05/19/2022= Patient reports only 1 fall in past 3 weeks. 06/09/2022= Patient denies any falls.    Time 4    Period Weeks    Status  MET   Target Date 04/22/22      PT SHORT TERM GOAL #2   Title Pt to report successful use of SPC for resumption of AMB activity, rpeorts no longer limitng activity due to fear of falling.    Baseline eval: avoiding much wlaking due ot fear of falling; 05/19/2022= Patient reports walking shorter distances without cane and longer  distances with cane ex: walking to mailbox.    Time 4    Period Weeks    Status GOAL MET   Target Date 04/22/22               PT Long Term Goals - 03/25/22 1543       PT LONG TERM GOAL #1   Title Pt to show improved FOTO score > 15 points to indicated improved feelings of confidence in balance    Baseline eval: 49; 05/19/2022= 44; 06/16/2022= 58%; 08/04/2022=62%   Time 12   Period Weeks    Status ONGOING    Target Date 09/08/2022     PT LONG TERM GOAL #2   Title Pt to demonstrate 6MWT >1431f c LRAD without any LOB at modI level to indicate ability to safely perform limited community AMB.    Baseline eval: learning SPC use; 05/19/2022= 965 feet with no device- Initial shuffle but vastly improved with distance. 06/09/2022= 1130 feet without an assistive device. 06/15/2022= 1135 feet  without an assistive device.; 07/21/2022= 1350 feet without an assistive device. 08/04/2022= 1300 feet without an assistive device   Time 12   Period Weeks    Status ONGOING   Target Date 09/08/2022     PT LONG TERM GOAL #3   Title Pt to show Berg balance test score increase > 10 points to indicate reduced risk of falls.    Baseline defered to visit 2; 05/19/2022= 44/56; 06/09/2022= 46/56; 06/15/2022= 47/56; 08/04/2022=48/56   Time 12   Period Weeks    Status ONGOING   Target Date 09/08/2022           PT LONG TERM GOAL #4  Title Patient will improve 5 times sit to stand test of less than 15 seconds to indicate improved lower extremity power and decrease risk of falls.    Baseline 9/28: 19.02 seconds; 06/16/2022= 19.08 sec without UE support; 08/04/2022= 13.15 sec without UE support.  Time 12  Period Weeks   Status ONGOING  Target Date 09/08/2022        Plan - 04/16/22 1035     Clinical Impression Statement Patient presents today in good spirits and reporting he feels like he is improving. He demonstrated continued progress with BLE weakness and mobility- Clinically significant improvement  with Sit to Stand test and later another point higher on BERG. He demo slight regression with 6 min walk test as compared to earlier this month but still overall  improved from evaluation. He also presents with improved self perceived abilities as seen in FOTO score. Patient's condition has the potential to improve in response to therapy. Maximum improvement is yet to be obtained. The anticipated improvement is attainable and reasonable in a generally predictable time. Patient will continue to benefit from skilled physical therapy intervention to address impairments, improve QOL, and attain therapy goals    Examination-Activity Limitations Locomotion Level;Carry    Examination-Participation Restrictions Community Activity;Cleaning    Stability/Clinical Decision Making Stable/Uncomplicated    Clinical Decision Making High    Rehab Potential Good    PT Frequency 2x / week    PT Duration 8 weeks    PT Treatment/Interventions DME Instruction;Gait training;Stair training;Functional mobility training;Therapeutic activities;Therapeutic exercise;Balance training;Neuromuscular re-education;Patient/family education    PT Next Visit Plan Review [facility] training and continue balance training.   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[place]) 05/30/2020   Anxiety 05/30/2020   Erectile dysfunction 05/30/2020   History of avoidant personality disorder 05/30/2020   History of dysthymic disorder 05/30/2020   History of  suicide attempt 05/30/2020   Hypothyroidism, acquired 05/30/2020   Diet-controlled type 2 diabetes mellitus ([place]) 11/30/2016   Chronic insomnia 08/25/2016   COPD (chronic obstructive pulmonary disease) ([place]) 04/03/2016   Alcohol use disorder, mild, abuse 02/27/2016   Severe recurrent major depression with psychotic features ([place]) 02/27/2016   Suicidal ideation 02/03/2016   Body mass index (BMI) of 32.0-32.9 in adult 01/23/2016   Fatty liver 10/24/2015   Benign prostatic hyperplasia with lower urinary tract symptoms 04/08/2015   GERD (gastroesophageal reflux disease) 02/19/2015   Essential hypertension 02/18/2015   Restless legs syndrome 02/18/2015    Kathlee Nations Jessenia Filippone PT  08/04/2022, 9:47 AM

## 2022-08-12 NOTE — Therapy (Signed)
[place] MAIN [facility] SERVICES [address] [name], [place], [address] Phone: [phone]   Fax:  [phone]  Physical Therapy Treatment Visit   Patient Details  Name: Mathew Aguilar MRN: [id number] Date of Birth: Oct 31, 1950 Referring Provider (PT): Dr. Harrel Lemon (PCP)   Encounter Date: 08/13/2022                 Past Medical History:  Diagnosis Date   Anxiety    Asthma    Borderline personality disorder [facility])    Depression    Dysrhythmia    Erectile dysfunction    Fatty liver    GERD (gastroesophageal reflux disease)    Hypertension    Hypothyroidism    Pulmonary nodule, right    Restless leg    Sleep apnea    Suicide attempt [facility])     Past Surgical History:  Procedure Laterality Date   AMPUTATION ARM     COLONOSCOPY     COLONOSCOPY WITH PROPOFOL N/A 08/22/2018   Procedure: COLONOSCOPY WITH PROPOFOL;  Surgeon: Lollie Sails, MD;  Location: [facility] ENDOSCOPY;  Service: Endoscopy;  Laterality: N/A;    Subjective Assessment -    Subjective [name] States the MD told him he has a nerve condition that makes his legs weak.    Pertinent History Mathew Aguilar is a 44yoM who comes to [facility] neuro referred by PCP after acute insidious onset difficulty with walking that began roughly 4 weeks ago upon waking that morning. Pt reports his issue to solely be related to difficulty with inititation of gait. Pt reports several falls assicated with this new problem. Pt denies any history of prior gait difficult or falls, denies any current or previous changes in strength, sensorium, no history or associated diziness, no LOC, no presyncope. Pt reports only mild improvement since onset. Pt was seen by PCP in office for this issue on 02/24/22 and referred to PT for referral. No imaging has been done, no referral to other provides.             There were no vitals filed for  this visit.   TODAY's INTERVENTIONS: 08/12/22  THEREX:   Seated hip march 4# AW alt LE x 15 reps each Seated knee ext 4# AW alt LE x 15 reps each  Retesting for progress note- 6 min walk test and 5 time Sit to Stand test- See goal section for details   Neuro Re-Ed:     BERG Balance test= 48/56    Rationale for Evaluation and Treatment Rehabilitation  HOME EXERCISE PROGRAM:   Access Code: [email or web address] URL: [email or web address] Date: 06/09/2022 Prepared by: Sande Brothers  Exercises - Single Leg Stance  - 1 x daily - 7 x weekly - 5 sets - 5-10 sec hold - Tandem Stance  - 1 x daily - 7 x weekly - 5 sets - 5-10 sec hold    PT Short Term Goals - 03/25/22 1541       PT SHORT TERM GOAL #1   Title Pt to report decrease of falls at home <1x/month.    Baseline eval: 2-3/week since onset; 05/19/2022= Patient reports only 1 fall in past 3 weeks. 06/09/2022= Patient denies any falls.    Time 4    Period Weeks    Status  MET   Target Date 04/22/22      PT SHORT TERM GOAL #2   Title Pt to report successful use  of SPC for resumption of AMB activity, rpeorts no longer limitng activity due to fear of falling.    Baseline eval: avoiding much wlaking due ot fear of falling; 05/19/2022= Patient reports walking shorter distances without cane and longer distances with cane ex: walking to mailbox.    Time 4    Period Weeks    Status GOAL MET   Target Date 04/22/22               PT Long Term Goals - 03/25/22 1543       PT LONG TERM GOAL #1   Title Pt to show improved FOTO score > 15 points to indicated improved feelings of confidence in balance    Baseline eval: 49; 05/19/2022= 44; 06/16/2022= 58%; 08/04/2022=62%   Time 12   Period Weeks    Status ONGOING    Target Date 09/08/2022     PT LONG TERM GOAL #2   Title Pt to demonstrate 6MWT >1481f c LRAD without any LOB at modI level to indicate ability to safely perform limited community AMB.    Baseline eval:  learning SPC use; 05/19/2022= 965 feet with no device- Initial shuffle but vastly improved with distance. 06/09/2022= 1130 feet without an assistive device. 06/15/2022= 1135 feet  without an assistive device.; 07/21/2022= 1350 feet without an assistive device. 08/04/2022= 1300 feet without an assistive device   Time 12   Period Weeks    Status ONGOING   Target Date 09/08/2022     PT LONG TERM GOAL #3   Title Pt to show Berg balance test score increase > 10 points to indicate reduced risk of falls.    Baseline defered to visit 2; 05/19/2022= 44/56; 06/09/2022= 46/56; 06/15/2022= 47/56; 08/04/2022=48/56   Time 12   Period Weeks    Status ONGOING   Target Date 09/08/2022           PT LONG TERM GOAL #4  Title Patient will improve 5 times sit to stand test of less than 15 seconds to indicate improved lower extremity power and decrease risk of falls.    Baseline 9/28: 19.02 seconds; 06/16/2022= 19.08 sec without UE support; 08/04/2022= 13.15 sec without UE support.  Time 12  Period Weeks   Status ONGOING  Target Date 09/08/2022        Plan - 04/16/22 1035     Clinical Impression Statement Patient presents today in good spirits and reporting he feels like he is improving. He demonstrated continued progress with BLE weakness and mobility- Clinically significant improvement with Sit to Stand test and later another point higher on BERG. He demo slight regression with 6 min walk test as compared to earlier this month but still overall improved from evaluation. He also presents with improved self perceived abilities as seen in FOTO score. Patient's condition has the potential to improve in response to therapy. Maximum improvement is yet to be obtained. The anticipated improvement is attainable and reasonable in a generally predictable time. Patient will continue to benefit from skilled physical therapy intervention to address impairments, improve QOL, and attain therapy goals    Examination-Activity  Limitations Locomotion Level;Carry    Examination-Participation Restrictions Community Activity;Cleaning    Stability/Clinical Decision Making Stable/Uncomplicated    Clinical Decision Making High    Rehab Potential Good    PT Frequency 2x / week    PT Duration 8 weeks    PT Treatment/Interventions DME Instruction;Gait training;Stair training;Functional mobility training;Therapeutic activities;Therapeutic exercise;Balance training;Neuromuscular re-education;Patient/family education    PT Next  Visit Plan Review [place] training and continue balance training.   PT Home Exercise Plan [facility] gettin' and training too!, focus on big steppin' with starting walk    Consulted and Agree with Plan of Care Patient                    Patient will benefit from skilled therapeutic intervention in order to improve the following deficits and impairments:     Visit Diagnosis: No diagnosis found.     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[place]) 05/30/2020   Anxiety 05/30/2020   Erectile dysfunction 05/30/2020   History of avoidant personality disorder 05/30/2020   History of dysthymic disorder 05/30/2020   History of suicide attempt 05/30/2020   Hypothyroidism, acquired 05/30/2020   Diet-controlled type 2 diabetes mellitus ([place]) 11/30/2016   Chronic insomnia 08/25/2016   COPD (chronic obstructive pulmonary disease) ([place]) 04/03/2016   Alcohol use disorder, mild, abuse 02/27/2016   Severe recurrent major depression with psychotic features ([place]) 02/27/2016   Suicidal ideation 02/03/2016   Body mass index (BMI) of 32.0-32.9 in adult 01/23/2016   Fatty liver 10/24/2015   Benign prostatic hyperplasia with lower urinary tract symptoms 04/08/2015   GERD (gastroesophageal reflux disease) 02/19/2015   Essential hypertension 02/18/2015   Restless legs syndrome 02/18/2015    Lewis Moccasin PT  08/12/2022, 2:47 PM

## 2022-08-13 ENCOUNTER — Ambulatory Visit: Payer: 59

## 2022-08-13 DIAGNOSIS — R269 Unspecified abnormalities of gait and mobility: Secondary | ICD-10-CM

## 2022-08-13 DIAGNOSIS — R296 Repeated falls: Secondary | ICD-10-CM

## 2022-08-13 DIAGNOSIS — R262 Difficulty in walking, not elsewhere classified: Secondary | ICD-10-CM

## 2022-08-13 DIAGNOSIS — R2681 Unsteadiness on feet: Secondary | ICD-10-CM

## 2022-08-17 NOTE — Therapy (Incomplete)
[place] MAIN [facility] SERVICES [address] [place], [place], [address] Phone: [phone]   Fax:  [phone]  Physical Therapy Treatment Visit   Patient Details  Name: Mathew Aguilar MRN: [id number] Date of Birth: 1950-11-02 Referring Provider (PT): Dr. Harrel Lemon (PCP)   Encounter Date: 08/18/2022                  Past Medical History:  Diagnosis Date   Anxiety    Asthma    Borderline personality disorder [facility])    Depression    Dysrhythmia    Erectile dysfunction    Fatty liver    GERD (gastroesophageal reflux disease)    Hypertension    Hypothyroidism    Pulmonary nodule, right    Restless leg    Sleep apnea    Suicide attempt [facility])     Past Surgical History:  Procedure Laterality Date   AMPUTATION ARM     COLONOSCOPY     COLONOSCOPY WITH PROPOFOL N/A 08/22/2018   Procedure: COLONOSCOPY WITH PROPOFOL;  Surgeon: Lollie Sails, MD;  Location: [facility] ENDOSCOPY;  Service: Endoscopy;  Laterality: N/A;    Subjective Assessment -    Subjective Patient reports doing well overall- States had a good Thanksgiving- but did report 1 fall- "Got up and started walking and fell forward."    Pertinent History Mathew Aguilar is a 54yoM who comes to [facility] OPPT neuro referred by PCP after acute insidious onset difficulty with walking that began roughly 4 weeks ago upon waking that morning. Pt reports his issue to solely be related to difficulty with inititation of gait. Pt reports several falls assicated with this new problem. Pt denies any history of prior gait difficult or falls, denies any current or previous changes in strength, sensorium, no history or associated diziness, no LOC, no presyncope. Pt reports only mild improvement since onset. Pt was seen by PCP in office for this issue on 02/24/22 and referred to PT for referral. No imaging has been done, no referral to other provides.             There were no vitals  filed for this visit.   TODAY's INTERVENTIONS: 08/17/22  THEREX:  High knee walk in // bars using 4# AW BLE - down and back -x4  (VC for increased step height)  Ham curl walk in // bars using 4# AW BLE- down and back x 4 Side step walk in // bars using 4# AW BLE- down and back x 4 Calf raise- Toe walk in // bars using 4# AW BLE- down and back x 4 (VC to keep heels off floor)  Seated knee ext 4# AW alt LE x 12 reps each Sit to stand x 12 without UE support Lunge squat walk in // bars using 4# AW BLE - down and back (good form with only min VC for slower cadence)     Neuro Re-Ed:     Tandem gait in // bars - down and back - x 4- (increased unsteadiness with frequent reaching for UE support)  Static stand on airex pad with horizontal and then vertical head turns x10 (no significant difficulty)  Dynamic marching on airex pad x 20 reps (VC to perform slower to focus more on balance)  Single leg stance- attempted multiple trials- Patient with increased overall difficulty and unable to maintain > 2 sec either leg today.  Step onto rocker board positioned anterior/post and step off- back and forth focusing  on step and ant weight shift x 6 as well.     Rationale for Evaluation and Treatment Rehabilitation  HOME EXERCISE PROGRAM:   Access Code: [email or web address] URL: [email or web address] Date: 06/09/2022 Prepared by: Sande Brothers  Exercises - Single Leg Stance  - 1 x daily - 7 x weekly - 5 sets - 5-10 sec hold - Tandem Stance  - 1 x daily - 7 x weekly - 5 sets - 5-10 sec hold    PT Short Term Goals - 03/25/22 1541       PT SHORT TERM GOAL #1   Title Pt to report decrease of falls at home <1x/month.    Baseline eval: 2-3/week since onset; 05/19/2022= Patient reports only 1 fall in past 3 weeks. 06/09/2022= Patient denies any falls.    Time 4    Period Weeks    Status  MET   Target Date 04/22/22      PT SHORT TERM GOAL #2   Title Pt to report successful use of SPC for  resumption of AMB activity, rpeorts no longer limitng activity due to fear of falling.    Baseline eval: avoiding much wlaking due ot fear of falling; 05/19/2022= Patient reports walking shorter distances without cane and longer distances with cane ex: walking to mailbox.    Time 4    Period Weeks    Status GOAL MET   Target Date 04/22/22               PT Long Term Goals - 03/25/22 1543       PT LONG TERM GOAL #1   Title Pt to show improved FOTO score > 15 points to indicated improved feelings of confidence in balance    Baseline eval: 49; 05/19/2022= 44; 06/16/2022= 58%; 08/04/2022=62%   Time 12   Period Weeks    Status ONGOING    Target Date 09/08/2022     PT LONG TERM GOAL #2   Title Pt to demonstrate 6MWT >1458f c LRAD without any LOB at modI level to indicate ability to safely perform limited community AMB.    Baseline eval: learning SPC use; 05/19/2022= 965 feet with no device- Initial shuffle but vastly improved with distance. 06/09/2022= 1130 feet without an assistive device. 06/15/2022= 1135 feet  without an assistive device.; 07/21/2022= 1350 feet without an assistive device. 08/04/2022= 1300 feet without an assistive device   Time 12   Period Weeks    Status ONGOING   Target Date 09/08/2022     PT LONG TERM GOAL #3   Title Pt to show Berg balance test score increase > 10 points to indicate reduced risk of falls.    Baseline defered to visit 2; 05/19/2022= 44/56; 06/09/2022= 46/56; 06/15/2022= 47/56; 08/04/2022=48/56   Time 12   Period Weeks    Status ONGOING   Target Date 09/08/2022           PT LONG TERM GOAL #4  Title Patient will improve 5 times sit to stand test of less than 15 seconds to indicate improved lower extremity power and decrease risk of falls.    Baseline 9/28: 19.02 seconds; 06/16/2022= 19.08 sec without UE support; 08/04/2022= 13.15 sec without UE support.  Time 12  Period Weeks   Status ONGOING  Target Date 09/08/2022        Plan - 04/16/22  1035     Clinical Impression Statement Patient presented with good motivation and performed well overall- most difficulty with single leg stance and unable to  improve with VC and visual demo. Otherwise he did well - improving with tandem standing and all resistive therex only VC for proper form. Patient will continue to benefit from skilled physical therapy intervention to address impairments, improve QOL, and attain therapy goals    Examination-Activity Limitations Locomotion Level;Carry    Examination-Participation Restrictions Community Activity;Cleaning    Stability/Clinical Decision Making Stable/Uncomplicated    Clinical Decision Making High    Rehab Potential Good    PT Frequency 2x / week    PT Duration 8 weeks    PT Treatment/Interventions DME Instruction;Gait training;Stair training;Functional mobility training;Therapeutic activities;Therapeutic exercise;Balance training;Neuromuscular re-education;Patient/family education    PT Next Visit Plan Review [facility] training and continue balance training.   PT Home Exercise Plan [facility] gettin' and training too!, focus on big steppin' with starting walk    Consulted and Agree with Plan of Care Patient                    Patient will benefit from skilled therapeutic intervention in order to improve the following deficits and impairments:     Visit Diagnosis: No diagnosis found.     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[place]) 05/30/2020   Anxiety 05/30/2020   Erectile dysfunction 05/30/2020   History of avoidant personality disorder 05/30/2020   History of dysthymic disorder 05/30/2020   History of suicide attempt 05/30/2020   Hypothyroidism, acquired 05/30/2020   Diet-controlled type 2 diabetes mellitus ([place]) 11/30/2016   Chronic insomnia 08/25/2016   COPD (chronic obstructive pulmonary disease) ([place]) 04/03/2016   Alcohol use disorder, mild, abuse 02/27/2016   Severe  recurrent major depression with psychotic features ([place]) 02/27/2016   Suicidal ideation 02/03/2016   Body mass index (BMI) of 32.0-32.9 in adult 01/23/2016   Fatty liver 10/24/2015   Benign prostatic hyperplasia with lower urinary tract symptoms 04/08/2015   GERD (gastroesophageal reflux disease) 02/19/2015   Essential hypertension 02/18/2015   Restless legs syndrome 02/18/2015    Kathlee Nations Jeronimo Hellberg PT  08/17/2022, 9:20 AM

## 2022-08-18 ENCOUNTER — Ambulatory Visit: Payer: 59 | Attending: Internal Medicine

## 2022-08-18 DIAGNOSIS — R269 Unspecified abnormalities of gait and mobility: Secondary | ICD-10-CM | POA: Insufficient documentation

## 2022-08-18 DIAGNOSIS — R262 Difficulty in walking, not elsewhere classified: Secondary | ICD-10-CM | POA: Insufficient documentation

## 2022-08-18 DIAGNOSIS — R2681 Unsteadiness on feet: Secondary | ICD-10-CM | POA: Insufficient documentation

## 2022-08-18 DIAGNOSIS — R296 Repeated falls: Secondary | ICD-10-CM | POA: Insufficient documentation

## 2022-08-20 ENCOUNTER — Encounter: Payer: Self-pay | Admitting: Physical Therapy

## 2022-08-20 ENCOUNTER — Ambulatory Visit: Payer: 59 | Admitting: Physical Therapy

## 2022-08-20 ENCOUNTER — Ambulatory Visit: Payer: 59

## 2022-08-20 DIAGNOSIS — R2681 Unsteadiness on feet: Secondary | ICD-10-CM | POA: Diagnosis present

## 2022-08-20 DIAGNOSIS — R296 Repeated falls: Secondary | ICD-10-CM

## 2022-08-20 DIAGNOSIS — R269 Unspecified abnormalities of gait and mobility: Secondary | ICD-10-CM | POA: Diagnosis present

## 2022-08-20 DIAGNOSIS — R262 Difficulty in walking, not elsewhere classified: Secondary | ICD-10-CM

## 2022-08-20 NOTE — Therapy (Signed)
[place] MAIN [facility] SERVICES [address] [place], [place], [address] Phone: [phone]   Fax:  [phone]  Physical Therapy Treatment Visit   Patient Details  Name: Mathew Aguilar MRN: [id number] Date of Birth: [birth year] Referring Provider (PT): Dr. Harrel Lemon (PCP)   Encounter Date: 08/20/2022   PT End of Session - 08/20/22 0755     Visit Number 32    Number of Visits 42    Date for PT Re-Evaluation 09/08/22    Authorization Type UHC Medicare, Medicaid secondary    Authorization Time Period 03/25/22-06/17/22; Recert [id number]    Progress Note Due on Visit 25    PT Start Time 0812    PT Stop Time 0844    PT Time Calculation (min) 32 min    Equipment Utilized During Treatment Gait belt    Activity Tolerance Patient tolerated treatment well    Behavior During Therapy WFL for tasks assessed/performed                          Past Medical History:  Diagnosis Date   Anxiety    Asthma    Borderline personality disorder ([place])    Depression    Dysrhythmia    Erectile dysfunction    Fatty liver    GERD (gastroesophageal reflux disease)    Hypertension    Hypothyroidism    Pulmonary nodule, right    Restless leg    Sleep apnea    Suicide attempt [facility])     Past Surgical History:  Procedure Laterality Date   AMPUTATION ARM     COLONOSCOPY     COLONOSCOPY WITH PROPOFOL N/A 08/22/2018   Procedure: COLONOSCOPY WITH PROPOFOL;  Surgeon: Lollie Sails, MD;  Location: [facility] ENDOSCOPY;  Service: Endoscopy;  Laterality: N/A;    Subjective Assessment -    Subjective Patient reports doing well overall, does not remember any falls since his last visit.    Pertinent History Mathew Aguilar is a 26yoM who comes to [facility] OPPT neuro referred by PCP after acute insidious onset difficulty with walking that began roughly 4 weeks ago upon waking that morning. Pt reports his issue to solely be related to  difficulty with inititation of gait. Pt reports several falls assicated with this new problem. Pt denies any history of prior gait difficult or falls, denies any current or previous changes in strength, sensorium, no history or associated diziness, no LOC, no presyncope. Pt reports only mild improvement since onset. Pt was seen by PCP in office for this issue on 02/24/22 and referred to PT for referral. No imaging has been done, no referral to other provides.             There were no vitals filed for this visit.   TODAY's INTERVENTIONS: 08/20/22  THEREX:  High knee walk in // bars using 5# AW BLE - down and back -x4  (VC for increased step height)  Ham curl  using 5# AW BLE- x 10 ea LE  Hip abduction using 5# AW BLE- x 10 ea LE  Calf raise- Toe walk in // bars using 4# AW BLE- down and back x 4 (VC to keep heels off floor)  Seated knee ext 5# AW alt LE x 12 reps each Blugarian split squat to work on [employer] and strength x 10 ea LE with UE support .    Neuro Re-Ed:     [name] taps x  4 rounds tapping 1 of 5 pods. Difficulty with stepping anteriorly ( a couple losses of balance). Good exercise for training reactive balance and stepping strategies.    Rationale for Evaluation and Treatment Rehabilitation  HOME EXERCISE PROGRAM:   Access Code: [email or web address] URL: [email or web address] Date: 06/09/2022 Prepared by: Sande Brothers  Exercises - Single Leg Stance  - 1 x daily - 7 x weekly - 5 sets - 5-10 sec hold - Tandem Stance  - 1 x daily - 7 x weekly - 5 sets - 5-10 sec hold    PT Short Term Goals - 03/25/22 1541       PT SHORT TERM GOAL #1   Title Pt to report decrease of falls at home <1x/month.    Baseline eval: 2-3/week since onset; 05/19/2022= Patient reports only 1 fall in past 3 weeks. 06/09/2022= Patient denies any falls.    Time 4    Period Weeks    Status  MET   Target Date 04/22/22      PT SHORT TERM GOAL #2   Title Pt to report successful use  of SPC for resumption of AMB activity, rpeorts no longer limitng activity due to fear of falling.    Baseline eval: avoiding much wlaking due ot fear of falling; 05/19/2022= Patient reports walking shorter distances without cane and longer distances with cane ex: walking to mailbox.    Time 4    Period Weeks    Status GOAL MET   Target Date 04/22/22               PT Long Term Goals - 03/25/22 1543       PT LONG TERM GOAL #1   Title Pt to show improved FOTO score > 15 points to indicated improved feelings of confidence in balance    Baseline eval: 49; 05/19/2022= 44; 06/16/2022= 58%; 08/04/2022=62%   Time 12   Period Weeks    Status ONGOING    Target Date 09/08/2022     PT LONG TERM GOAL #2   Title Pt to demonstrate 6MWT >1455f c LRAD without any LOB at modI level to indicate ability to safely perform limited community AMB.    Baseline eval: learning SPC use; 05/19/2022= 965 feet with no device- Initial shuffle but vastly improved with distance. 06/09/2022= 1130 feet without an assistive device. 06/15/2022= 1135 feet  without an assistive device.; 07/21/2022= 1350 feet without an assistive device. 08/04/2022= 1300 feet without an assistive device   Time 12   Period Weeks    Status ONGOING   Target Date 09/08/2022     PT LONG TERM GOAL #3   Title Pt to show Berg balance test score increase > 10 points to indicate reduced risk of falls.    Baseline defered to visit 2; 05/19/2022= 44/56; 06/09/2022= 46/56; 06/15/2022= 47/56; 08/04/2022=48/56   Time 12   Period Weeks    Status ONGOING   Target Date 09/08/2022           PT LONG TERM GOAL #4  Title Patient will improve 5 times sit to stand test of less than 15 seconds to indicate improved lower extremity power and decrease risk of falls.    Baseline 9/28: 19.02 seconds; 06/16/2022= 19.08 sec without UE support; 08/04/2022= 13.15 sec without UE support.  Time 12  Period Weeks   Status ONGOING  Target Date 09/08/2022        Plan -  04/16/22 1035     Clinical Impression Statement Pt presents with  great motivation for completion of PT interventions. PT session cut a little short today as pt came to incorrect appointment time and had to be worked into this patient's schedule. Pt continues to progress with LE strength and balance interventions. Pt will continue to benefit from skilled physical therapy intervention to address impairments, improve QOL, and attain therapy goals.     Examination-Activity Limitations Locomotion Level;Carry    Examination-Participation Restrictions Community Activity;Cleaning    Stability/Clinical Decision Making Stable/Uncomplicated    Clinical Decision Making High    Rehab Potential Good    PT Frequency 2x / week    PT Duration 8 weeks    PT Treatment/Interventions DME Instruction;Gait training;Stair training;Functional mobility training;Therapeutic activities;Therapeutic exercise;Balance training;Neuromuscular re-education;Patient/family education    PT Next Visit Plan Review [facility] training and continue balance training.   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[place]) 05/30/2020   Anxiety 05/30/2020   Erectile dysfunction 05/30/2020   History of avoidant personality disorder 05/30/2020   History of dysthymic disorder 05/30/2020   History of suicide attempt 05/30/2020   Hypothyroidism, acquired 05/30/2020   Diet-controlled type 2 diabetes mellitus ([place]) 11/30/2016   Chronic insomnia 08/25/2016    COPD (chronic obstructive pulmonary disease) ([place]) 04/03/2016   Alcohol use disorder, mild, abuse 02/27/2016   Severe recurrent major depression with psychotic features ([place]) 02/27/2016   Suicidal ideation 02/03/2016   Body mass index (BMI) of 32.0-32.9 in adult 01/23/2016   Fatty liver 10/24/2015   Benign prostatic hyperplasia with lower urinary tract symptoms 04/08/2015   GERD (gastroesophageal reflux disease) 02/19/2015   Essential hypertension 02/18/2015   Restless legs syndrome 02/18/2015    Particia Lather PT  08/20/2022, 10:34 AM

## 2022-08-24 NOTE — Therapy (Incomplete)
[place] MAIN [facility] SERVICES [address] [place], [place], [address] Phone: [phone]   Fax:  [phone]  Physical Therapy Treatment Visit   Patient Details  Name: Mathew Aguilar MRN: [id number] Date of Birth: Jan 19, 1951 Referring Provider (PT): Dr. Harrel Lemon (PCP)   Encounter Date: 08/25/2022                  Past Medical History:  Diagnosis Date   Anxiety    Asthma    Borderline personality disorder [facility])    Depression    Dysrhythmia    Erectile dysfunction    Fatty liver    GERD (gastroesophageal reflux disease)    Hypertension    Hypothyroidism    Pulmonary nodule, right    Restless leg    Sleep apnea    Suicide attempt [facility])     Past Surgical History:  Procedure Laterality Date   AMPUTATION ARM     COLONOSCOPY     COLONOSCOPY WITH PROPOFOL N/A 08/22/2018   Procedure: COLONOSCOPY WITH PROPOFOL;  Surgeon: Lollie Sails, MD;  Location: [facility] ENDOSCOPY;  Service: Endoscopy;  Laterality: N/A;    Subjective Assessment -    Subjective *** Patient reports doing well overall, does not remember any falls since his last visit.    Pertinent History Mathew Aguilar is a 37yoM who comes to [facility] OPPT neuro referred by PCP after acute insidious onset difficulty with walking that began roughly 4 weeks ago upon waking that morning. Pt reports his issue to solely be related to difficulty with inititation of gait. Pt reports several falls assicated with this new problem. Pt denies any history of prior gait difficult or falls, denies any current or previous changes in strength, sensorium, no history or associated diziness, no LOC, no presyncope. Pt reports only mild improvement since onset. Pt was seen by PCP in office for this issue on 02/24/22 and referred to PT for referral. No imaging has been done, no referral to other provides.             There were no vitals filed for this visit.   TODAY's  INTERVENTIONS: 08/24/22  ***  THEREX:  High knee walk in // bars using 5# AW BLE - down and back -x4  (VC for increased step height)  Ham curl  using 5# AW BLE- x 10 ea LE  Hip abduction using 5# AW BLE- x 10 ea LE  Calf raise- Toe walk in // bars using 4# AW BLE- down and back x 4 (VC to keep heels off floor)  Seated knee ext 5# AW alt LE x 12 reps each Blugarian split squat to work on [employer] and strength x 10 ea LE with UE support .    Neuro Re-Ed:     [name] taps x 4 rounds tapping 1 of 5 pods. Difficulty with stepping anteriorly ( a couple losses of balance). Good exercise for training reactive balance and stepping strategies.    Rationale for Evaluation and Treatment Rehabilitation  HOME EXERCISE PROGRAM:   Access Code: [email or web address] URL: [email or web address] Date: 06/09/2022 Prepared by: Sande Brothers  Exercises - Single Leg Stance  - 1 x daily - 7 x weekly - 5 sets - 5-10 sec hold - Tandem Stance  - 1 x daily - 7 x weekly - 5 sets - 5-10 sec hold    PT Short Term Goals - 03/25/22 1541       PT SHORT TERM GOAL #1  Title Pt to report decrease of falls at home <1x/month.    Baseline eval: 2-3/week since onset; 05/19/2022= Patient reports only 1 fall in past 3 weeks. 06/09/2022= Patient denies any falls.    Time 4    Period Weeks    Status  MET   Target Date 04/22/22      PT SHORT TERM GOAL #2   Title Pt to report successful use of SPC for resumption of AMB activity, rpeorts no longer limitng activity due to fear of falling.    Baseline eval: avoiding much wlaking due ot fear of falling; 05/19/2022= Patient reports walking shorter distances without cane and longer distances with cane ex: walking to mailbox.    Time 4    Period Weeks    Status GOAL MET   Target Date 04/22/22               PT Long Term Goals - 03/25/22 1543       PT LONG TERM GOAL #1   Title Pt to show improved FOTO score > 15 points to indicated improved feelings of  confidence in balance    Baseline eval: 49; 05/19/2022= 44; 06/16/2022= 58%; 08/04/2022=62%   Time 12   Period Weeks    Status ONGOING    Target Date 09/08/2022     PT LONG TERM GOAL #2   Title Pt to demonstrate 6MWT >1443f c LRAD without any LOB at modI level to indicate ability to safely perform limited community AMB.    Baseline eval: learning SPC use; 05/19/2022= 965 feet with no device- Initial shuffle but vastly improved with distance. 06/09/2022= 1130 feet without an assistive device. 06/15/2022= 1135 feet  without an assistive device.; 07/21/2022= 1350 feet without an assistive device. 08/04/2022= 1300 feet without an assistive device   Time 12   Period Weeks    Status ONGOING   Target Date 09/08/2022     PT LONG TERM GOAL #3   Title Pt to show Berg balance test score increase > 10 points to indicate reduced risk of falls.    Baseline defered to visit 2; 05/19/2022= 44/56; 06/09/2022= 46/56; 06/15/2022= 47/56; 08/04/2022=48/56   Time 12   Period Weeks    Status ONGOING   Target Date 09/08/2022           PT LONG TERM GOAL #4  Title Patient will improve 5 times sit to stand test of less than 15 seconds to indicate improved lower extremity power and decrease risk of falls.    Baseline 9/28: 19.02 seconds; 06/16/2022= 19.08 sec without UE support; 08/04/2022= 13.15 sec without UE support.  Time 12  Period Weeks   Status ONGOING  Target Date 09/08/2022        Plan - 04/16/22 1035     Clinical Impression Statement *** Pt presents with great motivation for completion of PT interventions. PT session cut a little short today as pt came to incorrect appointment time and had to be worked into this patient's schedule. Pt continues to progress with LE strength and balance interventions. Pt will continue to benefit from skilled physical therapy intervention to address impairments, improve QOL, and attain therapy goals.     Examination-Activity Limitations Locomotion Level;Carry     Examination-Participation Restrictions Community Activity;Cleaning    Stability/Clinical Decision Making Stable/Uncomplicated    Clinical Decision Making High    Rehab Potential Good    PT Frequency 2x / week    PT Duration 8 weeks    PT Treatment/Interventions DME  Instruction;Gait training;Stair training;Functional mobility training;Therapeutic activities;Therapeutic exercise;Balance training;Neuromuscular re-education;Patient/family education    PT Next Visit Plan Review [facility] training and continue balance training.   PT Home Exercise Plan [facility] gettin' and training too!, focus on big steppin' with starting walk    Consulted and Agree with Plan of Care Patient                    Patient will benefit from skilled therapeutic intervention in order to improve the following deficits and impairments:     Visit Diagnosis: No diagnosis found.     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[place]) 05/30/2020   Anxiety 05/30/2020   Erectile dysfunction 05/30/2020   History of avoidant personality disorder 05/30/2020   History of dysthymic disorder 05/30/2020   History of suicide attempt 05/30/2020   Hypothyroidism, acquired 05/30/2020   Diet-controlled type 2 diabetes mellitus ([place]) 11/30/2016   Chronic insomnia 08/25/2016   COPD (chronic obstructive pulmonary disease) ([place]) 04/03/2016   Alcohol use disorder, mild, abuse 02/27/2016   Severe recurrent major depression with psychotic features ([place]) 02/27/2016   Suicidal ideation 02/03/2016   Body mass index (BMI) of 32.0-32.9 in adult 01/23/2016   Fatty liver 10/24/2015   Benign prostatic hyperplasia with lower urinary tract symptoms 04/08/2015   GERD (gastroesophageal reflux disease) 02/19/2015   Essential hypertension 02/18/2015   Restless legs syndrome 02/18/2015    Gwenlyn Saran, PT, DPT Physical Therapist- [facility]  08/24/22, 5:16  PM

## 2022-08-25 ENCOUNTER — Ambulatory Visit: Payer: 59

## 2022-08-25 DIAGNOSIS — R296 Repeated falls: Secondary | ICD-10-CM

## 2022-08-25 DIAGNOSIS — R269 Unspecified abnormalities of gait and mobility: Secondary | ICD-10-CM

## 2022-08-25 DIAGNOSIS — R2681 Unsteadiness on feet: Secondary | ICD-10-CM

## 2022-08-25 DIAGNOSIS — R262 Difficulty in walking, not elsewhere classified: Secondary | ICD-10-CM | POA: Diagnosis not present

## 2022-08-25 NOTE — Therapy (Signed)
[place] MAIN [facility] SERVICES [address] [place], [place], [address] Phone: [phone]   Fax:  [phone]  Physical Therapy Treatment Visit   Patient Details  Name: Mathew Aguilar MRN: [id number] Date of Birth: 1950/09/16 Referring Provider (PT): Dr. Harrel Lemon (PCP)   Encounter Date: 08/25/2022   PT End of Session - 08/25/22 0809     Visit Number 33    Number of Visits 42    Date for PT Re-Evaluation 09/08/22    Authorization Type UHC Medicare, Medicaid secondary    Authorization Time Period 03/25/22-06/17/22; Recert [id number]    Progress Note Due on Visit 15    PT Start Time 0803    PT Stop Time 0842    PT Time Calculation (min) 39 min    Equipment Utilized During Treatment Gait belt    Activity Tolerance Patient tolerated treatment well    Behavior During Therapy WFL for tasks assessed/performed                          Past Medical History:  Diagnosis Date   Anxiety    Asthma    Borderline personality disorder ([place])    Depression    Dysrhythmia    Erectile dysfunction    Fatty liver    GERD (gastroesophageal reflux disease)    Hypertension    Hypothyroidism    Pulmonary nodule, right    Restless leg    Sleep apnea    Suicide attempt [facility])     Past Surgical History:  Procedure Laterality Date   AMPUTATION ARM     COLONOSCOPY     COLONOSCOPY WITH PROPOFOL N/A 08/22/2018   Procedure: COLONOSCOPY WITH PROPOFOL;  Surgeon: Lollie Sails, MD;  Location: [facility] ENDOSCOPY;  Service: Endoscopy;  Laterality: N/A;    Subjective Assessment -    Subjective Patient reports having a good week and no significant issues.    Pertinent History Mathew Aguilar is a 41yoM who comes to [facility] OPPT neuro referred by PCP after acute insidious onset difficulty with walking that began roughly 4 weeks ago upon waking that morning. Pt reports his issue to solely be related to difficulty with inititation of  gait. Pt reports several falls assicated with this new problem. Pt denies any history of prior gait difficult or falls, denies any current or previous changes in strength, sensorium, no history or associated diziness, no LOC, no presyncope. Pt reports only mild improvement since onset. Pt was seen by PCP in office for this issue on 02/24/22 and referred to PT for referral. No imaging has been done, no referral to other provides.             There were no vitals filed for this visit.   TODAY's INTERVENTIONS: 08/25/22  THEREX:  Step tap onto 12 " block x 20 reps without UE support  High knee walk in // bars using 5# AW BLE - down and back -x4  (VC for increased step height)  Calf raise- Toe walk in // bars using BLE- down and back x 4 (No VC required today)  Lunge squat in // bars - down and back x 4 Blugarian split squat to work on [employer] and strength x 12 ea LE without UE support today    Neuro Re-Ed:     [name] taps x 4 rounds tapping 1 of 5 pods (positioned in curve in front of patient). Difficulty with stepping anteriorly (  a couple losses of balance). Good exercise for training reactive balance and stepping strategies.  1) 41.13 sec with 1,275 avg reaction (ms)  2) 39.72 sec with 1,139 avg reaction (ms)  3) 39.31 sec with 1,094 avg reaction (ms)  4) 39.98 sec with 1, 159 avg reaction (ms)  Sit to stand then walk 10 feet - tap a cone and turn 180 deg and return back to chair in 3 trials 1) 12.59 sec 2) 9.89 sec 3) 9.31 sec  Step on small incline then off onto another then up/over orange hurdle then up/over 1/2 foam x 2 in // bars (x 5)   Rationale for Evaluation and Treatment Rehabilitation  HOME EXERCISE PROGRAM:   Access Code: [email or web address] URL: [email or web address] Date: 06/09/2022 Prepared by: Sande Brothers  Exercises - Single Leg Stance  - 1 x daily - 7 x weekly - 5 sets - 5-10 sec hold - Tandem Stance  - 1 x daily - 7 x weekly - 5 sets -  5-10 sec hold    PT Short Term Goals - 03/25/22 1541       PT SHORT TERM GOAL #1   Title Pt to report decrease of falls at home <1x/month.    Baseline eval: 2-3/week since onset; 05/19/2022= Patient reports only 1 fall in past 3 weeks. 06/09/2022= Patient denies any falls.    Time 4    Period Weeks    Status  MET   Target Date 04/22/22      PT SHORT TERM GOAL #2   Title Pt to report successful use of SPC for resumption of AMB activity, rpeorts no longer limitng activity due to fear of falling.    Baseline eval: avoiding much wlaking due ot fear of falling; 05/19/2022= Patient reports walking shorter distances without cane and longer distances with cane ex: walking to mailbox.    Time 4    Period Weeks    Status GOAL MET   Target Date 04/22/22               PT Long Term Goals - 03/25/22 1543       PT LONG TERM GOAL #1   Title Pt to show improved FOTO score > 15 points to indicated improved feelings of confidence in balance    Baseline eval: 49; 05/19/2022= 44; 06/16/2022= 58%; 08/04/2022=62%   Time 12   Period Weeks    Status ONGOING    Target Date 09/08/2022     PT LONG TERM GOAL #2   Title Pt to demonstrate 6MWT >1414f c LRAD without any LOB at modI level to indicate ability to safely perform limited community AMB.    Baseline eval: learning SPC use; 05/19/2022= 965 feet with no device- Initial shuffle but vastly improved with distance. 06/09/2022= 1130 feet without an assistive device. 06/15/2022= 1135 feet  without an assistive device.; 07/21/2022= 1350 feet without an assistive device. 08/04/2022= 1300 feet without an assistive device   Time 12   Period Weeks    Status ONGOING   Target Date 09/08/2022     PT LONG TERM GOAL #3   Title Pt to show Berg balance test score increase > 10 points to indicate reduced risk of falls.    Baseline defered to visit 2; 05/19/2022= 44/56; 06/09/2022= 46/56; 06/15/2022= 47/56; 08/04/2022=48/56   Time 12   Period Weeks    Status ONGOING    Target Date 09/08/2022           PT LONG TERM GOAL #4  Title Patient will improve 5 times sit to stand test of less than 15 seconds to indicate improved lower extremity power and decrease risk of falls.    Baseline 9/28: 19.02 seconds; 06/16/2022= 19.08 sec without UE support; 08/04/2022= 13.15 sec without UE support.  Time 12  Period Weeks   Status ONGOING  Target Date 09/08/2022        Plan - 04/16/22 1035     Clinical Impression Statement Treatment focused on continuing to progress LE strength and balance with good motivation for session. Patient performed well with therex- fatigue yet able to complete today. Patient improved with reaction time with balance activities today as well.  Pt will continue to benefit from skilled physical therapy intervention to address impairments, improve QOL, and attain therapy goals.     Examination-Activity Limitations Locomotion Level;Carry    Examination-Participation Restrictions Community Activity;Cleaning    Stability/Clinical Decision Making Stable/Uncomplicated    Clinical Decision Making High    Rehab Potential Good    PT Frequency 2x / week    PT Duration 8 weeks    PT Treatment/Interventions DME Instruction;Gait training;Stair training;Functional mobility training;Therapeutic activities;Therapeutic exercise;Balance training;Neuromuscular re-education;Patient/family education    PT Next Visit Plan Review [facility] training and continue balance training.   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[place]) 05/30/2020   Anxiety 05/30/2020   Erectile dysfunction 05/30/2020   History of avoidant personality disorder 05/30/2020   History of dysthymic disorder 05/30/2020   History of suicide attempt 05/30/2020   Hypothyroidism, acquired 05/30/2020   Diet-controlled type 2 diabetes mellitus ([place]) 11/30/2016   Chronic insomnia 08/25/2016   COPD (chronic obstructive pulmonary disease) ([place]) 04/03/2016   Alcohol use disorder, mild, abuse 02/27/2016   Severe recurrent major depression with psychotic features ([place]) 02/27/2016   Suicidal ideation 02/03/2016   Body mass index (BMI) of 32.0-32.9 in adult 01/23/2016   Fatty liver 10/24/2015   Benign prostatic hyperplasia with lower urinary tract symptoms 04/08/2015   GERD (gastroesophageal reflux disease) 02/19/2015   Essential hypertension 02/18/2015   Restless legs syndrome 02/18/2015    Lewis Moccasin PT  08/25/2022, 4:37 PM

## 2022-08-27 ENCOUNTER — Ambulatory Visit: Payer: 59

## 2022-08-27 DIAGNOSIS — R262 Difficulty in walking, not elsewhere classified: Secondary | ICD-10-CM | POA: Diagnosis not present

## 2022-08-27 DIAGNOSIS — R269 Unspecified abnormalities of gait and mobility: Secondary | ICD-10-CM

## 2022-08-27 DIAGNOSIS — R2681 Unsteadiness on feet: Secondary | ICD-10-CM

## 2022-08-27 DIAGNOSIS — R296 Repeated falls: Secondary | ICD-10-CM

## 2022-08-27 NOTE — Therapy (Signed)
[place] MAIN [facility] SERVICES [address] [place], [place], [address] Phone: [phone]   Fax:  [phone]  Physical Therapy Treatment Visit  Patient Details  Name: Mathew Aguilar MRN: [id number] Date of Birth: [birth year] Referring Provider (PT): Dr. Harrel Lemon (PCP)   Encounter Date: 08/27/2022   PT End of Session - 08/27/22 0936     Visit Number 34    Number of Visits 42    Date for PT Re-Evaluation 09/08/22    Authorization Type UHC Medicare, Medicaid secondary    Authorization Time Period 03/25/22-06/17/22; Recert 18/01/6313-97/10/6376    Progress Note Due on Visit 21    PT Start Time 0936    PT Stop Time 1015    PT Time Calculation (min) 39 min    Equipment Utilized During Treatment Gait belt    Activity Tolerance Patient tolerated treatment well    Behavior During Therapy WFL for tasks assessed/performed                Past Medical History:  Diagnosis Date   Anxiety    Asthma    Borderline personality disorder ([place])    Depression    Dysrhythmia    Erectile dysfunction    Fatty liver    GERD (gastroesophageal reflux disease)    Hypertension    Hypothyroidism    Pulmonary nodule, right    Restless leg    Sleep apnea    Suicide attempt [facility])     Past Surgical History:  Procedure Laterality Date   AMPUTATION ARM     COLONOSCOPY     COLONOSCOPY WITH PROPOFOL N/A 08/22/2018   Procedure: COLONOSCOPY WITH PROPOFOL;  Surgeon: Lollie Sails, MD;  Location: [facility] ENDOSCOPY;  Service: Endoscopy;  Laterality: N/A;    Subjective Assessment -    Subjective Pt reports he walked this morning prior to coming to his session.  Pt notes that he walked for ~20 minutes outside and is doing well.   Pertinent History Mathew Aguilar is a 69yoM who comes to [facility] OPPT neuro referred by PCP after acute insidious onset difficulty with walking that began roughly 4 weeks ago upon waking that morning. Pt reports his issue to  solely be related to difficulty with inititation of gait. Pt reports several falls assicated with this new problem. Pt denies any history of prior gait difficult or falls, denies any current or previous changes in strength, sensorium, no history or associated diziness, no LOC, no presyncope. Pt reports only mild improvement since onset. Pt was seen by PCP in office for this issue on 02/24/22 and referred to PT for referral. No imaging has been done, no referral to other provides.             There were no vitals filed for this visit.   TODAY's INTERVENTIONS: 08/27/22  THEREX:  Step tap onto 12 " block x 20 reps without UE support  High knee walk in // bars using 5# AW BLE - down and back, x4 Ambulation around the gym with 5# AW, including side steps/backwards walking/high knees, x3 total laps Calf raise- Toe walk in // bars using BLE- down and back, x4 Lunge squat in // bars - down and back, x4 [place] split squat to work on [employer] and strength x10 ea LE with UE support today  Lunge squats onto BOSU ball (round side up), 2x10 each LE Blocked practice performing STS then ambulating to chair 16' away and then back, x8 laps (  down and back)     Rationale for Evaluation and Treatment Rehabilitation  HOME EXERCISE PROGRAM:   Access Code: [email or web address] URL: [email or web address] Date: 06/09/2022 Prepared by: Sande Brothers  Exercises - Single Leg Stance  - 1 x daily - 7 x weekly - 5 sets - 5-10 sec hold - Tandem Stance  - 1 x daily - 7 x weekly - 5 sets - 5-10 sec hold    PT Short Term Goals - 03/25/22 1541       PT SHORT TERM GOAL #1   Title Pt to report decrease of falls at home <1x/month.    Baseline eval: 2-3/week since onset; 05/19/2022= Patient reports only 1 fall in past 3 weeks. 06/09/2022= Patient denies any falls.    Time 4    Period Weeks    Status  MET   Target Date 04/22/22      PT SHORT TERM GOAL #2   Title Pt to report successful use of SPC  for resumption of AMB activity, rpeorts no longer limitng activity due to fear of falling.    Baseline eval: avoiding much wlaking due ot fear of falling; 05/19/2022= Patient reports walking shorter distances without cane and longer distances with cane ex: walking to mailbox.    Time 4    Period Weeks    Status GOAL MET   Target Date 04/22/22               PT Long Term Goals - 03/25/22 1543       PT LONG TERM GOAL #1   Title Pt to show improved FOTO score > 15 points to indicated improved feelings of confidence in balance    Baseline eval: 49; 05/19/2022= 44; 06/16/2022= 58%; 08/04/2022=62%   Time 12   Period Weeks    Status ONGOING    Target Date 09/08/2022     PT LONG TERM GOAL #2   Title Pt to demonstrate 6MWT >1469f c LRAD without any LOB at modI level to indicate ability to safely perform limited community AMB.    Baseline eval: learning SPC use; 05/19/2022= 965 feet with no device- Initial shuffle but vastly improved with distance. 06/09/2022= 1130 feet without an assistive device. 06/15/2022= 1135 feet  without an assistive device.; 07/21/2022= 1350 feet without an assistive device. 08/04/2022= 1300 feet without an assistive device   Time 12   Period Weeks    Status ONGOING   Target Date 09/08/2022     PT LONG TERM GOAL #3   Title Pt to show Berg balance test score increase > 10 points to indicate reduced risk of falls.    Baseline defered to visit 2; 05/19/2022= 44/56; 06/09/2022= 46/56; 06/15/2022= 47/56; 08/04/2022=48/56   Time 12   Period Weeks    Status ONGOING   Target Date 09/08/2022           PT LONG TERM GOAL #4  Title Patient will improve 5 times sit to stand test of less than 15 seconds to indicate improved lower extremity power and decrease risk of falls.    Baseline 9/28: 19.02 seconds; 06/16/2022= 19.08 sec without UE support; 08/04/2022= 13.15 sec without UE support.  Time 12  Period Weeks   Status ONGOING  Target Date 09/08/2022        Plan -  04/16/22 1035     Clinical Impression Statement Pt performed well with the exercises given and is doing well with the strengthening exercises.  Pt performed well with the blocked practice and did  not have any shuffling of gait upon standing after repeated practice.  Pt notes that he does much better if he stands for a little before walking, but he typically tries to ambulate once he gets up and ends up shuffling more.  Pt encouraged to take his time after standing and even attempt standing marches in order to get movement started and if he falls, he would fall back into the seat.   Pt will continue to benefit from skilled therapy to address remaining deficits in order to improve overall QoL and return to PLOF.      Examination-Activity Limitations Locomotion Level;Carry    Examination-Participation Restrictions Community Activity;Cleaning    Stability/Clinical Decision Making Stable/Uncomplicated    Clinical Decision Making High    Rehab Potential Good    PT Frequency 2x / week    PT Duration 8 weeks    PT Treatment/Interventions DME Instruction;Gait training;Stair training;Functional mobility training;Therapeutic activities;Therapeutic exercise;Balance training;Neuromuscular re-education;Patient/family education    PT Next Visit Plan Review [facility] training and continue balance training.   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[place]) 05/30/2020   Anxiety 05/30/2020   Erectile dysfunction 05/30/2020    History of avoidant personality disorder 05/30/2020   History of dysthymic disorder 05/30/2020   History of suicide attempt 05/30/2020   Hypothyroidism, acquired 05/30/2020   Diet-controlled type 2 diabetes mellitus ([place]) 11/30/2016   Chronic insomnia 08/25/2016   COPD (chronic obstructive pulmonary disease) ([place]) 04/03/2016   Alcohol use disorder, mild, abuse 02/27/2016   Severe recurrent major depression with psychotic features ([place]) 02/27/2016   Suicidal ideation 02/03/2016   Body mass index (BMI) of 32.0-32.9 in adult 01/23/2016   Fatty liver 10/24/2015   Benign prostatic hyperplasia with lower urinary tract symptoms 04/08/2015   GERD (gastroesophageal reflux disease) 02/19/2015   Essential hypertension 02/18/2015   Restless legs syndrome 02/18/2015    Gwenlyn Saran, PT, DPT Physical Therapist- [facility]  08/27/22, 12:07 PM

## 2022-09-01 ENCOUNTER — Ambulatory Visit: Payer: 59 | Admitting: Physical Therapy

## 2022-09-01 ENCOUNTER — Encounter: Payer: Self-pay | Admitting: Physical Therapy

## 2022-09-01 DIAGNOSIS — R262 Difficulty in walking, not elsewhere classified: Secondary | ICD-10-CM

## 2022-09-01 DIAGNOSIS — R2681 Unsteadiness on feet: Secondary | ICD-10-CM

## 2022-09-01 DIAGNOSIS — R296 Repeated falls: Secondary | ICD-10-CM

## 2022-09-01 DIAGNOSIS — R269 Unspecified abnormalities of gait and mobility: Secondary | ICD-10-CM

## 2022-09-01 NOTE — Therapy (Signed)
[place] MAIN [facility] SERVICES [address] [place], [place], [address] Phone: [phone]   Fax:  [phone]  Physical Therapy Treatment Visit  Patient Details  Name: Mathew Aguilar MRN: [id number] Date of Birth: 22-Oct-1950 Referring Provider (PT): Dr. Harrel Lemon (PCP)   Encounter Date: 09/01/2022   PT End of Session - 09/01/22 0756     Visit Number 35    Number of Visits 42    Date for PT Re-Evaluation 09/08/22    Authorization Type UHC Medicare, Medicaid secondary    Authorization Time Period 03/25/22-06/17/22; Recert 35/03/176-93/90/3009    Progress Note Due on Visit 54    PT Start Time 0801    PT Stop Time 0840    PT Time Calculation (min) 39 min    Equipment Utilized During Treatment Gait belt    Activity Tolerance Patient tolerated treatment well    Behavior During Therapy WFL for tasks assessed/performed                Past Medical History:  Diagnosis Date   Anxiety    Asthma    Borderline personality disorder ([place])    Depression    Dysrhythmia    Erectile dysfunction    Fatty liver    GERD (gastroesophageal reflux disease)    Hypertension    Hypothyroidism    Pulmonary nodule, right    Restless leg    Sleep apnea    Suicide attempt [facility])     Past Surgical History:  Procedure Laterality Date   AMPUTATION ARM     COLONOSCOPY     COLONOSCOPY WITH PROPOFOL N/A 08/22/2018   Procedure: COLONOSCOPY WITH PROPOFOL;  Surgeon: Lollie Sails, MD;  Location: [facility] ENDOSCOPY;  Service: Endoscopy;  Laterality: N/A;    Subjective Assessment -    Subjective Pt reports having a fall this morning when he was walking in his house. Pt fell on his right side, he had been up for about 10 minutes. He reports no pain or injury related to the fall that he is aware of.    Pertinent History Mathew Aguilar is a 32yoM who comes to [facility] OPPT neuro referred by PCP after acute insidious onset difficulty with walking that began  roughly 4 weeks ago upon waking that morning. Pt reports his issue to solely be related to difficulty with inititation of gait. Pt reports several falls assicated with this new problem. Pt denies any history of prior gait difficult or falls, denies any current or previous changes in strength, sensorium, no history or associated diziness, no LOC, no presyncope. Pt reports only mild improvement since onset. Pt was seen by PCP in office for this issue on 02/24/22 and referred to PT for referral. No imaging has been done, no referral to other provides.             There were no vitals filed for this visit.   TODAY's INTERVENTIONS: 09/01/22  THEREX:  Step tap onto 12 " block x 20 reps without UE support   Activity Description: step tapping in accordance with blaze pod for reaction and dual task balance. R and L step up to 24 in block,  Activity Setting:  Home base  Number of Pods:  5 Cycles/Sets:  6 Duration (Time or Hit Count):  3 min with seated rest   Progressed difficulty as pt went on. Shuffling and errors decreased despite increased challenge. Challenged pt to differentiate color. Did not do blaze pod taps so pt  would have to maintain static balance in each position.     Rationale for Evaluation and Treatment Rehabilitation  HOME EXERCISE PROGRAM:   Access Code: [email or web address] URL: [email or web address] Date: 06/09/2022 Prepared by: Sande Brothers  Exercises - Single Leg Stance  - 1 x daily - 7 x weekly - 5 sets - 5-10 sec hold - Tandem Stance  - 1 x daily - 7 x weekly - 5 sets - 5-10 sec hold    PT Short Term Goals - 03/25/22 1541       PT SHORT TERM GOAL #1   Title Pt to report decrease of falls at home <1x/month.    Baseline eval: 2-3/week since onset; 05/19/2022= Patient reports only 1 fall in past 3 weeks. 06/09/2022= Patient denies any falls.    Time 4    Period Weeks    Status  MET   Target Date 04/22/22      PT SHORT TERM GOAL #2   Title Pt to  report successful use of SPC for resumption of AMB activity, rpeorts no longer limitng activity due to fear of falling.    Baseline eval: avoiding much wlaking due ot fear of falling; 05/19/2022= Patient reports walking shorter distances without cane and longer distances with cane ex: walking to mailbox.    Time 4    Period Weeks    Status GOAL MET   Target Date 04/22/22               PT Long Term Goals - 03/25/22 1543       PT LONG TERM GOAL #1   Title Pt to show improved FOTO score > 15 points to indicated improved feelings of confidence in balance    Baseline eval: 49; 05/19/2022= 44; 06/16/2022= 58%; 08/04/2022=62%   Time 12   Period Weeks    Status ONGOING    Target Date 09/08/2022     PT LONG TERM GOAL #2   Title Pt to demonstrate 6MWT >1427f c LRAD without any LOB at modI level to indicate ability to safely perform limited community AMB.    Baseline eval: learning SPC use; 05/19/2022= 965 feet with no device- Initial shuffle but vastly improved with distance. 06/09/2022= 1130 feet without an assistive device. 06/15/2022= 1135 feet  without an assistive device.; 07/21/2022= 1350 feet without an assistive device. 08/04/2022= 1300 feet without an assistive device   Time 12   Period Weeks    Status ONGOING   Target Date 09/08/2022     PT LONG TERM GOAL #3   Title Pt to show Berg balance test score increase > 10 points to indicate reduced risk of falls.    Baseline defered to visit 2; 05/19/2022= 44/56; 06/09/2022= 46/56; 06/15/2022= 47/56; 08/04/2022=48/56   Time 12   Period Weeks    Status ONGOING   Target Date 09/08/2022           PT LONG TERM GOAL #4  Title Patient will improve 5 times sit to stand test of less than 15 seconds to indicate improved lower extremity power and decrease risk of falls.    Baseline 9/28: 19.02 seconds; 06/16/2022= 19.08 sec without UE support; 08/04/2022= 13.15 sec without UE support.  Time 12  Period Weeks   Status ONGOING  Target Date  09/08/2022        Plan - 04/16/22 1035     Clinical Impression Statement Pt performed well utilizing challenge of blaze pods for coordination and balance challenge. Used them in functional way in  sequence with STS where pt has most difficulty with shuffling and LOB. Pt shuffling improved with practice but is still present at times following prolonged sitting or waking up in the morning. The patient demonstrated significant progress while utilizing [employer], showcasing improved coordination, balance, and cognitive function. The incorporation of dual-tasking technology with color recognition and association with specific movements in Blaze Pods was strategically chosen to provide a dynamic training environment, enabling the patient to engage in simultaneous physical and cognitive tasks. This unique approach enhances not only their physical abilities but also fosters increased neural connectivity and mental awareness, contributing to a well-rounded and effective rehabilitation and training experience.  Pt will continue to benefit from skilled physical therapy intervention to address impairments, improve QOL, and attain therapy goals.      Examination-Activity Limitations Locomotion Level;Carry    Examination-Participation Restrictions Community Activity;Cleaning    Stability/Clinical Decision Making Stable/Uncomplicated    Clinical Decision Making High    Rehab Potential Good    PT Frequency 2x / week    PT Duration 8 weeks    PT Treatment/Interventions DME Instruction;Gait training;Stair training;Functional mobility training;Therapeutic activities;Therapeutic exercise;Balance training;Neuromuscular re-education;Patient/family education    PT Next Visit Plan Review [facility] training and continue balance training.   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[place]) 05/30/2020   Anxiety 05/30/2020   Erectile dysfunction 05/30/2020   History of avoidant personality disorder 05/30/2020   History of dysthymic disorder 05/30/2020   History of suicide attempt 05/30/2020   Hypothyroidism, acquired 05/30/2020   Diet-controlled type 2 diabetes mellitus ([place]) 11/30/2016   Chronic insomnia 08/25/2016   COPD (chronic obstructive pulmonary disease) ([place]) 04/03/2016   Alcohol use disorder, mild, abuse 02/27/2016   Severe recurrent major depression with psychotic features ([place]) 02/27/2016   Suicidal ideation 02/03/2016   Body mass index (BMI) of 32.0-32.9 in adult 01/23/2016   Fatty liver 10/24/2015   Benign prostatic hyperplasia with lower urinary tract symptoms 04/08/2015   GERD (gastroesophageal reflux disease) 02/19/2015   Essential hypertension 02/18/2015   Restless legs syndrome 02/18/2015    Particia Lather PT  09/01/22, 11:40 AM

## 2022-09-03 ENCOUNTER — Ambulatory Visit: Payer: 59

## 2022-09-03 DIAGNOSIS — R269 Unspecified abnormalities of gait and mobility: Secondary | ICD-10-CM

## 2022-09-03 DIAGNOSIS — R262 Difficulty in walking, not elsewhere classified: Secondary | ICD-10-CM | POA: Diagnosis not present

## 2022-09-03 DIAGNOSIS — R296 Repeated falls: Secondary | ICD-10-CM

## 2022-09-03 DIAGNOSIS — R2681 Unsteadiness on feet: Secondary | ICD-10-CM

## 2022-09-03 NOTE — Therapy (Signed)
[place] [facility] SERVICES [address] [place], [place], [address] Phone: [phone]   Fax:  [phone]  Physical Therapy Treatment Visit  Patient Details  Name: Mathew Aguilar MRN: [id number] Date of Birth: [birth year] Referring Provider (PT): Dr. Harrel Lemon (PCP)   Encounter Date: 09/03/2022   PT End of Session - 09/03/22 1016     Visit Number 36    Number of Visits 42    Date for PT Re-Evaluation 09/08/22    Authorization Type UHC Medicare, Medicaid secondary    Authorization Time Period 03/25/22-06/17/22; Recert 01/0/0712-19/75/8832    Progress Note Due on Visit 54    PT Start Time 1015    PT Stop Time 1058    PT Time Calculation (min) 43 min    Equipment Utilized During Treatment Gait belt    Activity Tolerance Patient tolerated treatment well    Behavior During Therapy WFL for tasks assessed/performed                Past Medical History:  Diagnosis Date   Anxiety    Asthma    Borderline personality disorder ([place])    Depression    Dysrhythmia    Erectile dysfunction    Fatty liver    GERD (gastroesophageal reflux disease)    Hypertension    Hypothyroidism    Pulmonary nodule, right    Restless leg    Sleep apnea    Suicide attempt [facility])     Past Surgical History:  Procedure Laterality Date   AMPUTATION ARM     COLONOSCOPY     COLONOSCOPY WITH PROPOFOL N/A 08/22/2018   Procedure: COLONOSCOPY WITH PROPOFOL;  Surgeon: Lollie Sails, MD;  Location: [facility] ENDOSCOPY;  Service: Endoscopy;  Laterality: N/A;    Subjective Assessment -    Subjective Pt denies falls. Reports walking a lot this morning. No pain, reports condition feels about the same.    Pertinent History Mathew Aguilar is a 29yoM who comes to [facility] neuro referred by PCP after acute insidious onset difficulty with walking that began roughly 4 weeks ago upon waking that morning. Pt reports his issue to solely be related to difficulty  with inititation of gait. Pt reports several falls assicated with this new problem. Pt denies any history of prior gait difficult or falls, denies any current or previous changes in strength, sensorium, no history or associated diziness, no LOC, no presyncope. Pt reports only mild improvement since onset. Pt was seen by PCP in office for this issue on 02/24/22 and referred to PT for referral. No imaging has been done, no referral to other provides.             There were no vitals filed for this visit.   TODAY's INTERVENTIONS: 09/03/22  THEREX:  Step tap onto 24 " block x 20 reps/LE without UE support   Activity Description: step tapping in accordance with blaze pod for reaction and dual task balance. R and L step up to 24 in block,  Activity Setting:  Random Number of Pods: 2 Cycles/Sets:  6 Duration (Time or Hit Count):  20 hit count  Progressed difficulty to 24" step throughout. Seated rest during exercise.    Activity Description: STS with step tap to blaze pod Activity Setting:  Random Number of Pods:  2 Cycles/Sets:  3 Duration (Time or Hit Count):  10 hit count   Activity Description: side stepping foot taps tow ork on reaction time and reciprocal  steps to reduce shuffling gait Activity Setting:  Random Number of Pods:  6 Cycles/Sets: 4 Duration (Time or Hit Count):  10    The [name] Random setting was chosen to enhance cognitive processing and agility, providing an unpredictable environment to simulate real-world scenarios, and fostering quick reactions and adaptability.   Rationale for Evaluation and Treatment Rehabilitation  HOME EXERCISE PROGRAM:   Access Code: [email or web address] URL: [email or web address] Date: 06/09/2022 Prepared by: Sande Brothers  Exercises - Single Leg Stance  - 1 x daily - 7 x weekly - 5 sets - 5-10 sec hold - Tandem Stance  - 1 x daily - 7 x weekly - 5 sets - 5-10 sec hold    PT Short Term Goals - 03/25/22 1541        PT SHORT TERM GOAL #1   Title Pt to report decrease of falls at home <1x/month.    Baseline eval: 2-3/week since onset; 05/19/2022= Patient reports only 1 fall in past 3 weeks. 06/09/2022= Patient denies any falls.    Time 4    Period Weeks    Status  MET   Target Date 04/22/22      PT SHORT TERM GOAL #2   Title Pt to report successful use of SPC for resumption of AMB activity, rpeorts no longer limitng activity due to fear of falling.    Baseline eval: avoiding much wlaking due ot fear of falling; 05/19/2022= Patient reports walking shorter distances without cane and longer distances with cane ex: walking to mailbox.    Time 4    Period Weeks    Status GOAL MET   Target Date 04/22/22               PT Long Term Goals - 03/25/22 1543       PT LONG TERM GOAL #1   Title Pt to show improved FOTO score > 15 points to indicated improved feelings of confidence in balance    Baseline eval: 49; 05/19/2022= 44; 06/16/2022= 58%; 08/04/2022=62%   Time 12   Period Weeks    Status ONGOING    Target Date 09/08/2022     PT LONG TERM GOAL #2   Title Pt to demonstrate 6MWT >1422f c LRAD without any LOB at modI level to indicate ability to safely perform limited community AMB.    Baseline eval: learning SPC use; 05/19/2022= 965 feet with no device- Initial shuffle but vastly improved with distance. 06/09/2022= 1130 feet without an assistive device. 06/15/2022= 1135 feet  without an assistive device.; 07/21/2022= 1350 feet without an assistive device. 08/04/2022= 1300 feet without an assistive device   Time 12   Period Weeks    Status ONGOING   Target Date 09/08/2022     PT LONG TERM GOAL #3   Title Pt to show Berg balance test score increase > 10 points to indicate reduced risk of falls.    Baseline defered to visit 2; 05/19/2022= 44/56; 06/09/2022= 46/56; 06/15/2022= 47/56; 08/04/2022=48/56   Time 12   Period Weeks    Status ONGOING   Target Date 09/08/2022           PT LONG TERM GOAL #4   Title Patient will improve 5 times sit to stand test of less than 15 seconds to indicate improved lower extremity power and decrease risk of falls.    Baseline 9/28: 19.02 seconds; 06/16/2022= 19.08 sec without UE support; 08/04/2022= 13.15 sec without UE support.  Time 12  Period Weeks   Status  ONGOING  Target Date 09/08/2022        Plan - 04/16/22 1035     Clinical Impression Statement The patient demonstrated significant progress while utilizing Blaze Pods, showcasing improved coordination, balance, and cognitive function. The incorporation of dual-tasking technology with color recognition and association with specific movements in Blaze Pods was strategically chosen to provide a dynamic training environment, enabling the patient to engage in simultaneous physical and cognitive tasks. Pt performing multiple bouts of randomization and step ups on 24" step and STS with step tap to further challenge SLS time and hip flexor strength and initiating step through gait after STS.This unique approach enhances not only their physical abilities but also fosters increased neural connectivity and mental awareness, contributing to a well-rounded and effective rehabilitation and training experience. Pt will continue to benefit from skilled physical therapy intervention to address impairments, improve QOL, and attain therapy goals.     Examination-Activity Limitations Locomotion Level;Carry    Examination-Participation Restrictions Community Activity;Cleaning    Stability/Clinical Decision Making Stable/Uncomplicated    Clinical Decision Making High    Rehab Potential Good    PT Frequency 2x / week    PT Duration 8 weeks    PT Treatment/Interventions DME Instruction;Gait training;Stair training;Functional mobility training;Therapeutic activities;Therapeutic exercise;Balance training;Neuromuscular re-education;Patient/family education    PT Next Visit Plan Review [facility] training and continue balance training.    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[place]) 05/30/2020   Anxiety 05/30/2020   Erectile dysfunction 05/30/2020   History of avoidant personality disorder 05/30/2020   History of dysthymic disorder 05/30/2020   History of suicide attempt 05/30/2020   Hypothyroidism, acquired 05/30/2020   Diet-controlled type 2 diabetes mellitus ([place]) 11/30/2016   Chronic insomnia 08/25/2016   COPD (chronic obstructive pulmonary disease) ([place]) 04/03/2016   Alcohol use disorder, mild, abuse 02/27/2016   Severe recurrent major depression with psychotic features ([place]) 02/27/2016   Suicidal ideation 02/03/2016   Body mass index (BMI) of 32.0-32.9 in adult 01/23/2016   Fatty liver 10/24/2015   Benign prostatic hyperplasia with lower urinary tract symptoms 04/08/2015   GERD (gastroesophageal reflux disease) 02/19/2015   Essential hypertension 02/18/2015   Restless legs syndrome 02/18/2015   Salem Caster. Fairly IV, PT, DPT Physical Therapist- [facility]  09/03/22, 11:00 AM

## 2022-09-08 ENCOUNTER — Ambulatory Visit: Payer: 59

## 2022-09-08 ENCOUNTER — Encounter: Payer: Self-pay | Admitting: Oncology

## 2022-09-08 ENCOUNTER — Inpatient Hospital Stay: Payer: 59 | Attending: Oncology | Admitting: Oncology

## 2022-09-08 ENCOUNTER — Inpatient Hospital Stay: Payer: 59

## 2022-09-08 VITALS — BP 117/55 | HR 73 | Temp 98.7°F | Resp 16 | Ht 60.0 in | Wt 152.8 lb

## 2022-09-08 DIAGNOSIS — D472 Monoclonal gammopathy: Secondary | ICD-10-CM

## 2022-09-08 DIAGNOSIS — Z79899 Other long term (current) drug therapy: Secondary | ICD-10-CM | POA: Diagnosis not present

## 2022-09-08 DIAGNOSIS — R262 Difficulty in walking, not elsewhere classified: Secondary | ICD-10-CM | POA: Diagnosis not present

## 2022-09-08 DIAGNOSIS — I10 Essential (primary) hypertension: Secondary | ICD-10-CM | POA: Insufficient documentation

## 2022-09-08 DIAGNOSIS — E039 Hypothyroidism, unspecified: Secondary | ICD-10-CM | POA: Diagnosis not present

## 2022-09-08 DIAGNOSIS — R2681 Unsteadiness on feet: Secondary | ICD-10-CM

## 2022-09-08 DIAGNOSIS — Z7984 Long term (current) use of oral hypoglycemic drugs: Secondary | ICD-10-CM | POA: Insufficient documentation

## 2022-09-08 DIAGNOSIS — R269 Unspecified abnormalities of gait and mobility: Secondary | ICD-10-CM

## 2022-09-08 DIAGNOSIS — R296 Repeated falls: Secondary | ICD-10-CM

## 2022-09-08 LAB — CBC WITH DIFFERENTIAL/PLATELET
Abs Immature Granulocytes: 0.01 10*3/uL (ref 0.00–0.07)
Basophils Absolute: 0.1 10*3/uL (ref 0.0–0.1)
Basophils Relative: 1 %
Eosinophils Absolute: 0.1 10*3/uL (ref 0.0–0.5)
Eosinophils Relative: 1 %
HCT: 45.3 % (ref 39.0–52.0)
Hemoglobin: 16.2 g/dL (ref 13.0–17.0)
Immature Granulocytes: 0 %
Lymphocytes Relative: 25 %
Lymphs Abs: 1.1 10*3/uL (ref 0.7–4.0)
MCH: 31.2 pg (ref 26.0–34.0)
MCHC: 35.8 g/dL (ref 30.0–36.0)
MCV: 87.3 fL (ref 80.0–100.0)
Monocytes Absolute: 0.4 10*3/uL (ref 0.1–1.0)
Monocytes Relative: 9 %
Neutro Abs: 2.9 10*3/uL (ref 1.7–7.7)
Neutrophils Relative %: 64 %
Platelets: 224 10*3/uL (ref 150–400)
RBC: 5.19 MIL/uL (ref 4.22–5.81)
RDW: 13 % (ref 11.5–15.5)
WBC: 4.6 10*3/uL (ref 4.0–10.5)
nRBC: 0 % (ref 0.0–0.2)

## 2022-09-08 LAB — BASIC METABOLIC PANEL
Anion gap: 6 (ref 5–15)
BUN: 14 mg/dL (ref 8–23)
CO2: 22 mmol/L (ref 22–32)
Calcium: 8.7 mg/dL — ABNORMAL LOW (ref 8.9–10.3)
Chloride: 106 mmol/L (ref 98–111)
Creatinine, Ser: 0.62 mg/dL (ref 0.61–1.24)
GFR, Estimated: >60 mL/min (ref >60)
Glucose, Bld: 213 mg/dL — ABNORMAL HIGH (ref 70–99)
Potassium: 3.9 mmol/L (ref 3.5–5.1)
Sodium: 134 mmol/L — ABNORMAL LOW (ref 135–145)

## 2022-09-08 NOTE — Progress Notes (Signed)
[place]  [phone] [phone]  ID: Mathew Aguilar OB: 04/11/1951  MR#: [id number]  EHM#:[id number]  Patient Care Team: Baxter Hire, MD as PCP - General (Internal Medicine)  CHIEF COMPLAINT: MGUS  INTERVAL HISTORY: Patient is a 71 year old male who was noted to have a mildly increased M spike on routine lab work for workup of difficulty walking.  He currently feels well and at his baseline.  Patient reports his difficulty walking has resolved and was medication related.  He has no neurologic complaints.  He denies any recent fevers or illnesses.  He has a good appetite and denies weight loss.  He does not complain of pain today.  He has no chest pain, shortness of breath, cough, or hemoptysis.  He denies any nausea, vomiting, constipation, or diarrhea.  He has no urinary complaints.  Patient offers no further specific complaints today.  REVIEW OF SYSTEMS:   Review of Systems  Constitutional: Negative.  Negative for fever, malaise/fatigue and weight loss.  Respiratory: Negative.  Negative for cough, hemoptysis and shortness of breath.   Cardiovascular: Negative.  Negative for chest pain and leg swelling.  Gastrointestinal: Negative.  Negative for abdominal pain.  Genitourinary: Negative.  Negative for dysuria.  Musculoskeletal: Negative.  Negative for back pain.  Skin: Negative.  Negative for rash.  Neurological: Negative.  Negative for dizziness, focal weakness, weakness and headaches.  Psychiatric/Behavioral: Negative.  The patient is not nervous/anxious.     As per HPI. Otherwise, a complete review of systems is negative.  PAST MEDICAL HISTORY: Past Medical History:  Diagnosis Date   Anxiety    Asthma    Borderline personality disorder ([place])    Depression    Dysrhythmia    Erectile dysfunction    Fatty liver    GERD (gastroesophageal reflux disease)    Hypertension    Hypothyroidism    Pulmonary nodule, right    Restless  leg    Sleep apnea    Suicide attempt ([place])     PAST SURGICAL HISTORY: Past Surgical History:  Procedure Laterality Date   AMPUTATION ARM     COLONOSCOPY     COLONOSCOPY WITH PROPOFOL N/A 08/22/2018   Procedure: COLONOSCOPY WITH PROPOFOL;  Surgeon: Lollie Sails, MD;  [facility] ENDOSCOPY;  Service: Endoscopy;  Laterality: N/A;    FAMILY HISTORY: Family History  Family history unknown: Yes    ADVANCED DIRECTIVES (Y/N):  N  HEALTH MAINTENANCE: Social History   Tobacco Use   Smoking status: Never   Smokeless tobacco: Current    Types: Chew  Vaping Use   Vaping Use: Never used  Substance Use Topics   Alcohol use: Yes    Comment: history of ETOH abuse - sober several months   Drug use: No     Colonoscopy:  PAP:  Bone density:  Lipid panel:  No Known Allergies  Current Outpatient Medications  Medication Sig Dispense Refill   ABILIFY MAINTENA 400 MG SRER Inject 400 mg into the skin every 30 (thirty) days.     atenolol (TENORMIN) 25 MG tablet Take 25 mg by mouth daily.     doxazosin (CARDURA) 8 MG tablet Take 1 tablet (8 mg total) by mouth daily. 90 tablet 3   DULoxetine (CYMBALTA) 60 MG capsule Take 2 capsules (120 mg total) by mouth daily. (Patient taking differently: Take 60 mg by mouth 2 (two) times daily.) 60 capsule 0   finasteride (PROSCAR) 5 MG tablet Take 1 tablet (5 mg total)  by mouth daily. 90 tablet 3   gabapentin (NEURONTIN) 100 MG capsule Take 100 mg by mouth 3 (three) times daily.     GEMTESA 75 MG TABS Take 75 mg by mouth daily. 90 tablet 3   hydrochlorothiazide (MICROZIDE) 12.5 MG capsule Take 1 capsule (12.5 mg total) by mouth daily. 30 capsule 0   lamoTRIgine (LAMICTAL) 100 MG tablet Take 50-100 mg by mouth daily. Take 50 mg by mouth every morning and 100 mg by mouth at bedtime for two days then take 100 mg by mouth daily thereafter.     levothyroxine (SYNTHROID, LEVOTHROID) 75 MCG tablet Take 1 tablet (75 mcg total) by mouth daily before  breakfast. 30 tablet 0   metFORMIN (GLUCOPHAGE) 500 MG tablet Take 1 tablet (500 mg total) by mouth daily. 30 tablet 0   omeprazole (PRILOSEC) 20 MG capsule Take 20 mg by mouth daily.     oxybutynin (DITROPAN-XL) 10 MG 24 hr tablet TAKE 1 TABLET(10 MG) BY MOUTH DAILY 90 tablet 3   pramipexole (MIRAPEX) 1 MG tablet Take by mouth.     primidone (MYSOLINE) 50 MG tablet Take by mouth at bedtime.     rOPINIRole (REQUIP) 1 MG tablet Take 1 mg by mouth 3 (three) times daily.     albuterol (PROVENTIL HFA;VENTOLIN HFA) 108 (90 Base) MCG/ACT inhaler Inhale 2 puffs into the lungs every 6 (six) hours as needed for wheezing or shortness of breath. (Patient not taking: Reported on 09/08/2022) 1 Inhaler 0   doxepin (SINEQUAN) 25 MG capsule Take 25 mg by mouth at bedtime. (Patient not taking: Reported on 09/08/2022)     ergocalciferol (VITAMIN D2) 1.25 MG (50000 UT) capsule Take 1 capsule by mouth once a week. (Patient not taking: Reported on 09/08/2022)     HYDROcodone-acetaminophen (NORCO/VICODIN) 5-325 MG tablet Take 1 tablet by mouth every 6 (six) hours as needed for moderate pain or severe pain. (Patient not taking: Reported on 09/08/2022) 6 tablet 0   levocetirizine (XYZAL) 5 MG tablet Take 5 mg by mouth every evening. (Patient not taking: Reported on 09/08/2022)     loratadine (CLARITIN) 10 MG tablet Take 1 tablet (10 mg total) by mouth daily. (Patient not taking: Reported on 09/08/2022) 30 tablet 0   Melatonin 3 MG TABS Take 3 mg by mouth at bedtime as needed. (Patient not taking: Reported on 09/08/2022)     mirtazapine (REMERON) 15 MG tablet Take 15 mg by mouth at bedtime. (Patient not taking: Reported on 09/08/2022)     mirtazapine (REMERON) 30 MG tablet Take 30 mg by mouth at bedtime. (Patient not taking: Reported on 09/08/2022)     nystatin ointment (MYCOSTATIN) Apply 1 application topically 2 (two) times daily. (Patient not taking: Reported on 09/08/2022) 30 g 0   nystatin-triamcinolone ointment  (MYCOLOG) Apply 1 application topically 2 (two) times daily. (Patient not taking: Reported on 09/08/2022) 30 g 0   omega-3 acid ethyl esters (LOVAZA) 1 g capsule Take 1 g by mouth daily. (Patient not taking: Reported on 09/08/2022)     oxybutynin (DITROPAN-XL) 10 MG 24 hr tablet TAKE 1 TABLET(10 MG) BY MOUTH DAILY (Patient not taking: Reported on 09/08/2022) 90 tablet 3   rosuvastatin (CRESTOR) 10 MG tablet Take 10 mg by mouth at bedtime. (Patient not taking: Reported on 09/08/2022)     sulfamethoxazole-trimethoprim (BACTRIM DS) 800-160 MG tablet Take 1 tablet by mouth every 12 (twelve) hours. (Patient not taking: Reported on 09/08/2022) 14 tablet 0   tadalafil (CIALIS) 20 MG tablet  Take by mouth. (Patient not taking: Reported on 09/08/2022)     temazepam (RESTORIL) 15 MG capsule Take 15 mg by mouth daily. (Patient not taking: Reported on 09/08/2022)     traZODone (DESYREL) 100 MG tablet Take 100 mg by mouth at bedtime. (Patient not taking: Reported on 09/08/2022)     triamcinolone (KENALOG) 0.025 % cream Apply 1 application topically 2 (two) times daily. (Patient not taking: Reported on 09/08/2022) 30 g 0   No current facility-administered medications for this visit.    OBJECTIVE: Vitals:   09/08/22 1259  BP: (!) 117/55  Pulse: 73  Resp: 16  Temp: 98.7 F (37.1 C)  SpO2: 99%     Body mass index is 29.84 kg/m.    ECOG FS:0 - Asymptomatic  General: Well-developed, well-nourished, no acute distress. Eyes: Pink conjunctiva, anicteric sclera. HEENT: Normocephalic, moist mucous membranes. Lungs: No audible wheezing or coughing. Heart: Regular rate and rhythm. Abdomen: Soft, nontender, no obvious distention. Musculoskeletal: Right arm amputee. Neuro: Alert, answering all questions appropriately. Cranial nerves grossly intact. Skin: No rashes or petechiae noted. Psych: Normal affect. Lymphatics: No cervical, calvicular, axillary or inguinal LAD.   LAB RESULTS:  Lab Results   Component Value Date   NA 137 09/12/2020   K 4.1 09/12/2020   CL 100 09/12/2020   CO2 22 09/12/2020   GLUCOSE 134 (H) 09/12/2020   BUN 12 09/12/2020   CREATININE 0.65 09/12/2020   CALCIUM 9.1 09/12/2020   PROT 7.9 09/12/2020   ALBUMIN 3.9 09/12/2020   AST 32 09/12/2020   ALT 47 (H) 09/12/2020   ALKPHOS 39 09/12/2020   BILITOT 1.6 (H) 09/12/2020   GFRNONAA >60 09/12/2020   GFRAA >60 07/09/2019    Lab Results  Component Value Date   WBC 4.6 09/08/2022   NEUTROABS 2.9 09/08/2022   HGB 16.2 09/08/2022   HCT 45.3 09/08/2022   MCV 87.3 09/08/2022   PLT 224 09/08/2022     STUDIES: No results found.  ASSESSMENT: MGUS  PLAN:    MGUS: Patient noted to have an M spike of 0.6 on outside labs.  Repeat SPEP is pending at time of dictation.  Immunoglobulins and kappa/lambda free light chains are also pending.  He has no evidence of endorgan damage with a normal CBC, creatinine, and calcium levels.  No intervention is needed at this time.  Patient does not require imaging or bone marrow biopsy.  Return to clinic in 3 months with repeat MGUS laboratory work and further evaluation. Hyperbilirubinemia: Mild.  Patient's total bilirubin is 1.6.  Monitor.  I spent a total of 45 minutes reviewing chart data, face-to-face evaluation with the patient, counseling and coordination of care as detailed above.   Patient expressed understanding and was in agreement with this plan. He also understands that He can call clinic at any time with any questions, concerns, or complaints.    Lloyd Huger, MD   09/08/2022 2:19 PM

## 2022-09-08 NOTE — Addendum Note (Signed)
Addended by: Philipp Deputy on: 09/08/2022 03:18 PM   Modules accepted: Orders

## 2022-09-08 NOTE — Therapy (Signed)
[place] MA[facility] SERVICES [address] [place], [place], [address] Phone: [phone]   Fax:  [phone]  Physical Therapy Treatment Visit/PT DISCHARGE VISIT  Patient Details  Name: Mathew Aguilar MRN: [id number] Date of Birth: 10-30-1950 Referring Provider (PT): Dr. Harrel Lemon (PCP)   Encounter Date: 09/08/2022   PT End of Session - 09/08/22 0758     Visit Number 37    Number of Visits 42    Date for PT Re-Evaluation 09/08/22    Authorization Type [employer] Medicare, Medicaid secondary    Authorization Time Period 03/25/22-06/17/22; Recert [id number]    Progress Note Due on Visit 40    PT Start Time 0800    PT Stop Time 0842    PT Time Calculation (min) 42 min    Equipment Utilized During Treatment Gait belt    Activity Tolerance Patient tolerated treatment well    Behavior During Therapy WFL for tasks assessed/performed                Past Medical History:  Diagnosis Date   Anxiety    Asthma    Borderline personality disorder ([place])    Depression    Dysrhythmia    Erectile dysfunction    Fatty liver    GERD (gastroesophageal reflux disease)    Hypertension    Hypothyroidism    Pulmonary nodule, right    Restless leg    Sleep apnea    Suicide attempt [facility] [place])     Past Surgical History:  Procedure Laterality Date   AMPUTATION ARM     COLONOSCOPY     COLONOSCOPY WITH PROPOFOL N/A 08/22/2018   Procedure: COLONOSCOPY WITH PROPOFOL;  Surgeon: Lollie Sails, MD;  Location: [facility] ENDOSCOPY;  Service: Endoscopy;  Laterality: N/A;    Subjective Assessment -    Subjective Pt reports doing well overall- walking more often without cane. States very compliant with all home exercise program.     Pertinent History Mathew Aguilar is a 29yoM who comes to [facility] referred by PCP after acute insidious onset difficulty with walking that began roughly 4 weeks ago upon waking that morning. Pt reports his issue  to solely be related to difficulty with inititation of gait. Pt reports several falls assicated with this new problem. Pt denies any history of prior gait difficult or falls, denies any current or previous changes in strength, sensorium, no history or associated diziness, no LOC, no presyncope. Pt reports only mild improvement since onset. Pt was seen by PCP in office for this issue on 02/24/22 and referred to PT for referral. No imaging has been done, no referral to other provides.             There were no vitals filed for this visit.   TODAY's INTERVENTIONS: 09/08/22  THEREX:  Review of Current HEP including daily walking, balance and strengthening exercises. Patient reports compliant and able to recall tandem standing/walk, SLS, Sit to stand, calf raises, marching in place as some of the exercises he is doing. Patient able to demonstrate correct technique with all activities.  6 min walk test- 1335 feet without Device  NMR:  BERG balance test  Discussion of taking his time with initiation of walk- due to persistent shuffling of gait.      Rationale for Evaluation and Treatment Rehabilitation  HOME EXERCISE PROGRAM:   Access Code: [email or web address] URL: [email or web address] Date: 06/09/2022 Prepared by: Sande Brothers  Exercises - Single Leg Stance  - 1  x daily - 7 x weekly - 5 sets - 5-10 sec hold - Tandem Stance  - 1 x daily - 7 x weekly - 5 sets - 5-10 sec hold    PT Short Term Goals - 03/25/22 1541       PT SHORT TERM GOAL #1   Title Pt to report decrease of falls at home <1x/month.    Baseline eval: 2-3/week since onset; 05/19/2022= Patient reports only 1 fall in past 3 weeks. 06/09/2022= Patient denies any falls.    Time 4    Period Weeks    Status  MET   Target Date 04/22/22      PT SHORT TERM GOAL #2   Title Pt to report successful use of SPC for resumption of AMB activity, rpeorts no longer limitng activity due to fear of falling.    Baseline  eval: avoiding much wlaking due ot fear of falling; 05/19/2022= Patient reports walking shorter distances without cane and longer distances with cane ex: walking to mailbox.    Time 4    Period Weeks    Status GOAL MET   Target Date 04/22/22               PT Long Term Goals - 03/25/22 1543       PT LONG TERM GOAL #1   Title Pt to show improved FOTO score > 15 points to indicated improved feelings of confidence in balance    Baseline eval: 49; 05/19/2022= 44; 06/16/2022= 58%; 08/04/2022=62%; 09/08/2022= 59%   Time 12   Period Weeks    Status GOAL NOT MET yet patient did score above predicted value of 55   Target Date 09/08/2022     PT LONG TERM GOAL #2   Title Pt to demonstrate 6MWT >1472f c LRAD without any LOB at modI level to indicate ability to safely perform limited community AMB.    Baseline eval: learning SPC use; 05/19/2022= 965 feet with no device- Initial shuffle but vastly improved with distance. 06/09/2022= 1130 feet without an assistive device. 06/15/2022= 1135 feet  without an assistive device.; 07/21/2022= 1350 feet without an assistive device. 08/04/2022= 1300 feet without an assistive device; 09/08/2022= 1335 feet without an assistive device   Time 12   Period Weeks    Status GOAL NOT MET   Target Date 09/08/2022     PT LONG TERM GOAL #3   Title Pt to show Berg balance test score increase > 10 points to indicate reduced risk of falls.    Baseline defered to visit 2; 05/19/2022= 44/56; 06/09/2022= 46/56; 06/15/2022= 47/56; 08/04/2022=48/56   Time 12; 09/08/2022= 51/56   Period Weeks    Status GOAL MET    Target Date 09/08/2022           PT LONG TERM GOAL #4  Title Patient will improve 5 times sit to stand test of less than 15 seconds to indicate improved lower extremity power and decrease risk of falls.    Baseline 9/28: 19.02 seconds; 06/16/2022= 19.08 sec without UE support; 08/04/2022= 13.15 sec without UE support. 09/08/2022= 12.27 sec   Time 12  Period Weeks    Status GOAL MET  Target Date 09/08/2022        Plan - 04/16/22 1035     Clinical Impression Statement Patient presents today for PT discharge visit with excellent motivation. Denies any recent falls and states has progressed to mostly walking at home and community without a device. He presents today with improving LE  strength and balance. He met his 5 time sit to stand and BERG balance test with much improved values. He improved on his 6 min walk test and much better overall- falling just short of goal distance. He also improved overall with FOTO score since initiation of PT interventions despite falling short of PT goal. He is happy with his progress and no further PT skilled need at this time. Patient is appropriate for discharge with majority of goals met and to continue with his HEP on his own.    Examination-Activity Limitations Locomotion Level;Carry    Examination-Participation Restrictions Community Activity;Cleaning    Stability/Clinical Decision Making Stable/Uncomplicated    Clinical Decision Making High    Rehab Potential Good    PT Frequency 2x / week    PT Duration 8 weeks    PT Treatment/Interventions DME Instruction;Gait training;Stair training;Functional mobility training;Therapeutic activities;Therapeutic exercise;Balance training;Neuromuscular re-education;Patient/family education    PT Next Visit Plan Review [facility] training and continue balance training.   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[place]) 05/30/2020   Anxiety 05/30/2020   Erectile dysfunction 05/30/2020   History of avoidant personality disorder 05/30/2020   History of dysthymic disorder 05/30/2020   History of suicide attempt 05/30/2020   Hypothyroidism, acquired 05/30/2020   Diet-controlled type 2 diabetes mellitus ([place]) 11/30/2016   Chronic insomnia 08/25/2016   COPD (chronic obstructive pulmonary disease) ([place]) 04/03/2016   Alcohol use disorder, mild, abuse 02/27/2016   Severe recurrent major depression with psychotic features ([place]) 02/27/2016   Suicidal ideation 02/03/2016   Body mass index (BMI) of 32.0-32.9 in adult 01/23/2016   Fatty liver 10/24/2015   Benign prostatic hyperplasia with lower urinary tract symptoms 04/08/2015   GERD (gastroesophageal reflux disease) 02/19/2015   Essential hypertension 02/18/2015   Restless legs syndrome 02/18/2015   Ollen Bowl, PT Physical Therapist- [facility]  09/08/22, 9:10 AM

## 2022-09-09 ENCOUNTER — Ambulatory Visit: Payer: 59 | Admitting: Physical Therapy

## 2022-09-10 LAB — IGG, IGA, IGM
IgA: 623 mg/dL — ABNORMAL HIGH (ref 61–437)
IgG (Immunoglobin G), Serum: 553 mg/dL — ABNORMAL LOW (ref 603–1613)
IgM (Immunoglobulin M), Srm: 381 mg/dL — ABNORMAL HIGH (ref 15–143)

## 2022-09-10 LAB — KAPPA/LAMBDA LIGHT CHAINS
Kappa free light chain: 16.8 mg/L (ref 3.3–19.4)
Kappa, lambda light chain ratio: 0.25 — ABNORMAL LOW (ref 0.26–1.65)
Lambda free light chains: 66.3 mg/L — ABNORMAL HIGH (ref 5.7–26.3)

## 2022-09-11 ENCOUNTER — Ambulatory Visit: Payer: 59

## 2022-09-11 LAB — PROTEIN ELECTROPHORESIS, SERUM
A/G Ratio: 1.2 (ref 0.7–1.7)
Albumin ELP: 3.6 g/dL (ref 2.9–4.4)
Alpha-1-Globulin: 0.2 g/dL (ref 0.0–0.4)
Alpha-2-Globulin: 0.8 g/dL (ref 0.4–1.0)
Beta Globulin: 1.3 g/dL (ref 0.7–1.3)
Gamma Globulin: 0.9 g/dL (ref 0.4–1.8)
Globulin, Total: 3.1 g/dL (ref 2.2–3.9)
Total Protein ELP: 6.7 g/dL (ref 6.0–8.5)

## 2022-09-15 ENCOUNTER — Ambulatory Visit: Payer: 59 | Admitting: Physical Therapy

## 2022-09-17 ENCOUNTER — Ambulatory Visit: Payer: 59

## 2022-09-18 ENCOUNTER — Emergency Department (HOSPITAL_COMMUNITY)
Admission: EM | Admit: 2022-09-18 | Discharge: 2022-09-18 | Disposition: A | Discharge status: Home / Self Care | Payer: 59 | Attending: Emergency Medicine | Admitting: Emergency Medicine

## 2022-09-18 ENCOUNTER — Other Ambulatory Visit: Payer: Self-pay

## 2022-09-18 ENCOUNTER — Emergency Department (HOSPITAL_COMMUNITY): Payer: 59

## 2022-09-18 DIAGNOSIS — Z79899 Other long term (current) drug therapy: Secondary | ICD-10-CM | POA: Insufficient documentation

## 2022-09-18 DIAGNOSIS — S0191XA Laceration without foreign body of unspecified part of head, initial encounter: Secondary | ICD-10-CM | POA: Insufficient documentation

## 2022-09-18 DIAGNOSIS — J449 Chronic obstructive pulmonary disease, unspecified: Secondary | ICD-10-CM | POA: Insufficient documentation

## 2022-09-18 DIAGNOSIS — I10 Essential (primary) hypertension: Secondary | ICD-10-CM | POA: Insufficient documentation

## 2022-09-18 DIAGNOSIS — S0990XA Unspecified injury of head, initial encounter: Secondary | ICD-10-CM

## 2022-09-18 LAB — LACTIC ACID, PLASMA: Lactic Acid, Venous: 2.1 mmol/L (ref 0.5–1.9)

## 2022-09-18 MED ORDER — SODIUM CHLORIDE 0.9 % IV BOLUS
500.0000 mL | Freq: Once | INTRAVENOUS | Status: AC
  Administered 2022-09-18: 500 mL via INTRAVENOUS

## 2022-09-18 MED ORDER — FENTANYL CITRATE PF 50 MCG/ML IJ SOSY
25.0000 ug | PREFILLED_SYRINGE | Freq: Once | INTRAMUSCULAR | Status: AC
  Administered 2022-09-18: 25 ug via INTRAVENOUS
  Filled 2022-09-18: qty 1

## 2022-09-18 MED ORDER — ACETAMINOPHEN 500 MG PO TABS
1000.0000 mg | ORAL_TABLET | Freq: Once | ORAL | Status: AC
  Administered 2022-09-18: 1000 mg via ORAL
  Filled 2022-09-18: qty 2
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
## 2022-09-18 NOTE — ED Notes (Signed)
Date and time results received: 09/18/22 2236  (use smartphrase ".now" to insert current time)  Test: lactic acid Critical Value: 2.1  Name of Provider Notified: Kirby Funk, MD
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
## 2022-09-23 ENCOUNTER — Ambulatory Visit: Payer: 59 | Admitting: Physical Therapy

## 2022-09-25 ENCOUNTER — Ambulatory Visit: Payer: 59

## 2022-09-28 ENCOUNTER — Ambulatory Visit: Payer: 59

## 2022-10-02 ENCOUNTER — Ambulatory Visit: Payer: 59

## 2022-10-06 ENCOUNTER — Ambulatory Visit: Payer: 59 | Admitting: Physical Therapy

## 2022-10-07 ENCOUNTER — Ambulatory Visit: Payer: 59

## 2022-10-08 ENCOUNTER — Ambulatory Visit: Payer: 59 | Admitting: Physical Therapy

## 2022-10-09 ENCOUNTER — Ambulatory Visit: Payer: 59

## 2022-10-13 ENCOUNTER — Ambulatory Visit: Payer: 59 | Admitting: Physical Therapy

## 2022-10-13 ENCOUNTER — Ambulatory Visit: Payer: 59

## 2022-10-13 NOTE — Therapy (Incomplete)
OUTPATIENT PHYSICAL THERAPY NEURO EVALUATION   Patient Name: Mathew Aguilar MRN: [id number] DOB:10/02/1950, 72 y.o., male Today's Date: 10/13/2022   PCP: *** REFERRING PROVIDER: ***  END OF SESSION:   Past Medical History:  Diagnosis Date   Anxiety    Asthma    Borderline personality disorder ([place])    Depression    Dysrhythmia    Erectile dysfunction    Fatty liver    GERD (gastroesophageal reflux disease)    Hypertension    Hypothyroidism    Pulmonary nodule, right    Restless leg    Sleep apnea    Suicide attempt [facility])    Past Surgical History:  Procedure Laterality Date   AMPUTATION ARM     COLONOSCOPY     COLONOSCOPY WITH PROPOFOL N/A 08/22/2018   Procedure: COLONOSCOPY WITH PROPOFOL;  Surgeon: Lollie Sails, MD;  Location: [facility] ENDOSCOPY;  Service: Endoscopy;  Laterality: N/A;   Patient Active Problem List   Diagnosis Date Noted   MGUS (monoclonal gammopathy of unknown significance) 09/08/2022   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[place]) 05/30/2020   Anxiety 05/30/2020   Erectile dysfunction 05/30/2020   History of avoidant personality disorder 05/30/2020   History of dysthymic disorder 05/30/2020   History of suicide attempt 05/30/2020   Hypothyroidism, acquired 05/30/2020   Diet-controlled type 2 diabetes mellitus ([place]) 11/30/2016   Chronic insomnia 08/25/2016   COPD (chronic obstructive pulmonary disease) ([place]) 04/03/2016   Alcohol use disorder, mild, abuse 02/27/2016   Severe recurrent major depression with psychotic features ([place]) 02/27/2016   Suicidal ideation 02/03/2016   Body mass index (BMI) of 32.0-32.9 in adult 01/23/2016   Fatty liver 10/24/2015   Benign prostatic hyperplasia with lower urinary tract symptoms 04/08/2015   GERD (gastroesophageal reflux disease) 02/19/2015   Essential hypertension 02/18/2015   Restless legs syndrome 02/18/2015    ONSET DATE: ***  REFERRING DIAG:  R26.9 (ICD-10-CM) - Abnormality of gait  R29.898 (ICD-10-CM) -  Other musculoskeletal symptoms referable to limbs(729.89)    THERAPY DIAG:  No diagnosis found.  Rationale for Evaluation and Treatment: {HABREHAB:27488}  SUBJECTIVE:                                                                                                                                                                                             SUBJECTIVE STATEMENT: *** Pt accompanied by: {accompnied:27141}  PERTINENT HISTORY: ***  PAIN:  Are you having pain? {OPRCPAIN:27236}  PRECAUTIONS: {Therapy precautions:24002}  WEIGHT BEARING RESTRICTIONS: {Yes ***/No:24003}  FALLS: Has patient fallen in last 6 months? {fallsyesno:27318}  LIVING ENVIRONMENT: Lives with: {OPRC lives with:25569::"lives with their family"} Lives in: {  Lives in:25570} Stairs: {opstairs:27293} Has following equipment at home: {Assistive devices:23999}  PLOF: {PLOF:24004}  PATIENT GOALS: ***  OBJECTIVE:   DIAGNOSTIC FINDINGS: ***  COGNITION: Overall cognitive status: {cognition:24006}   SENSATION: {sensation:27233}  COORDINATION: ***  EDEMA:  {edema:24020}  MUSCLE TONE: {LE tone:25568}  MUSCLE LENGTH: Hamstrings: Right *** deg; Left *** deg Thomas test: Right *** deg; Left *** deg  DTRs:  {DTR SITE:24025}  POSTURE: {posture:25561}  LOWER EXTREMITY ROM:     {AROM/PROM:27142}  Right Eval Left Eval  Hip flexion    Hip extension    Hip abduction    Hip adduction    Hip internal rotation    Hip external rotation    Knee flexion    Knee extension    Ankle dorsiflexion    Ankle plantarflexion    Ankle inversion    Ankle eversion     (Blank rows = not tested)  LOWER EXTREMITY MMT:    MMT Right Eval Left Eval  Hip flexion    Hip extension    Hip abduction    Hip adduction    Hip internal rotation    Hip external rotation    Knee flexion    Knee extension    Ankle dorsiflexion    Ankle plantarflexion    Ankle inversion    Ankle eversion    (Blank rows =  not tested)  BED MOBILITY:  {Bed mobility:24027}  TRANSFERS: Assistive device utilized: {Assistive devices:23999}  Sit to stand: {Levels of assistance:24026} Stand to sit: {Levels of assistance:24026} Chair to chair: {Levels of assistance:24026} Floor: {Levels of assistance:24026}  RAMP:  Level of Assistance: {Levels of assistance:24026} Assistive device utilized: {Assistive devices:23999} Ramp Comments: ***  CURB:  Level of Assistance: {Levels of assistance:24026} Assistive device utilized: {Assistive devices:23999} Curb Comments: ***  STAIRS: Level of Assistance: {Levels of assistance:24026} Stair Negotiation Technique: {Stair Technique:27161} with {Rail Assistance:27162} Number of Stairs: ***  Height of Stairs: ***  Comments: ***  GAIT: Gait pattern: {gait characteristics:25376} Distance walked: *** Assistive device utilized: {Assistive devices:23999} Level of assistance: {Levels of assistance:24026} Comments: ***  FUNCTIONAL TESTS:  {Functional tests:24029}  PATIENT SURVEYS:  {rehab surveys:24030}  TODAY'S TREATMENT:                                                                                                                              DATE: ***    PATIENT EDUCATION: Education details: *** Person educated: {Person educated:25204} Education method: {Education Method:25205} Education comprehension: {Education Comprehension:25206}  HOME EXERCISE PROGRAM: ***  GOALS: Goals reviewed with patient? {yes/no:20286}  SHORT TERM GOALS: Target date: ***  *** Baseline: Goal status: {GOALSTATUS:25110}  2.  *** Baseline:  Goal status: {GOALSTATUS:25110}  3.  *** Baseline:  Goal status: {GOALSTATUS:25110}  4.  *** Baseline:  Goal status: {GOALSTATUS:25110}  5.  *** Baseline:  Goal status: {GOALSTATUS:25110}  6.  *** Baseline:  Goal status: {GOALSTATUS:25110}  LONG TERM GOALS: Target date: ***  *** Baseline:  Goal status:  {GOALSTATUS:25110}  2.  *** Baseline:  Goal status: {GOALSTATUS:25110}  3.  *** Baseline:  Goal status: {GOALSTATUS:25110}  4.  *** Baseline:  Goal status: {GOALSTATUS:25110}  5.  *** Baseline:  Goal status: {GOALSTATUS:25110}  6.  *** Baseline:  Goal status: {GOALSTATUS:25110}  ASSESSMENT:  CLINICAL IMPRESSION: Patient is a *** y.o. *** who was seen today for physical therapy evaluation and treatment for ***.   OBJECTIVE IMPAIRMENTS: {opptimpairments:25111}.   ACTIVITY LIMITATIONS: {activitylimitations:27494}  PARTICIPATION LIMITATIONS: {participationrestrictions:25113}  PERSONAL FACTORS: {Personal factors:25162} are also affecting patient's functional outcome.   REHAB POTENTIAL: {rehabpotential:25112}  CLINICAL DECISION MAKING: {clinical decision making:25114}  EVALUATION COMPLEXITY: {Evaluation complexity:25115}  PLAN:  PT FREQUENCY: {rehab frequency:25116}  PT DURATION: {rehab duration:25117}  PLANNED INTERVENTIONS: {rehab planned interventions:25118::"Therapeutic exercises","Therapeutic activity","Neuromuscular re-education","Balance training","Gait training","Patient/Family education","Self Care","Joint mobilization"}  PLAN FOR NEXT SESSION: ***   Lewis Moccasin, PT 10/13/2022, 8:17 AM

## 2022-10-15 ENCOUNTER — Ambulatory Visit: Payer: 59 | Admitting: Physical Therapy

## 2022-10-19 ENCOUNTER — Ambulatory Visit: Payer: 59 | Attending: Internal Medicine

## 2022-10-19 DIAGNOSIS — M6281 Muscle weakness (generalized): Secondary | ICD-10-CM | POA: Insufficient documentation

## 2022-10-19 DIAGNOSIS — R262 Difficulty in walking, not elsewhere classified: Secondary | ICD-10-CM | POA: Diagnosis present

## 2022-10-19 DIAGNOSIS — R2689 Other abnormalities of gait and mobility: Secondary | ICD-10-CM | POA: Insufficient documentation

## 2022-10-19 DIAGNOSIS — R296 Repeated falls: Secondary | ICD-10-CM | POA: Diagnosis present

## 2022-10-19 DIAGNOSIS — R2681 Unsteadiness on feet: Secondary | ICD-10-CM

## 2022-10-19 DIAGNOSIS — R269 Unspecified abnormalities of gait and mobility: Secondary | ICD-10-CM
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
## 2022-10-20 ENCOUNTER — Ambulatory Visit: Payer: 59 | Admitting: Physical Therapy

## 2022-10-21 ENCOUNTER — Ambulatory Visit: Payer: 59 | Admitting: Physical Therapy

## 2022-10-21 ENCOUNTER — Other Ambulatory Visit: Payer: Self-pay | Admitting: Urology

## 2022-10-21 ENCOUNTER — Encounter: Payer: Self-pay | Admitting: Physical Therapy

## 2022-10-21 DIAGNOSIS — N3941 Urge incontinence: Secondary | ICD-10-CM

## 2022-10-21 DIAGNOSIS — R262 Difficulty in walking, not elsewhere classified: Secondary | ICD-10-CM | POA: Diagnosis not present

## 2022-10-21 DIAGNOSIS — R269 Unspecified abnormalities of gait and mobility: Secondary | ICD-10-CM

## 2022-10-21 DIAGNOSIS — R2681 Unsteadiness on feet: Secondary | ICD-10-CM

## 2022-10-21 DIAGNOSIS — R296 Repeated falls: Secondary | ICD-10-CM
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
## 2022-10-22 ENCOUNTER — Ambulatory Visit: Payer: 59 | Admitting: Physical Therapy

## 2022-10-27 ENCOUNTER — Ambulatory Visit: Payer: 59 | Admitting: Physical Therapy

## 2022-10-27 ENCOUNTER — Encounter: Payer: Self-pay | Admitting: Physical Therapy

## 2022-10-27 DIAGNOSIS — R262 Difficulty in walking, not elsewhere classified: Secondary | ICD-10-CM | POA: Diagnosis not present

## 2022-10-27 DIAGNOSIS — R269 Unspecified abnormalities of gait and mobility: Secondary | ICD-10-CM

## 2022-10-27 DIAGNOSIS — R2681 Unsteadiness on feet: Secondary | ICD-10-CM

## 2022-10-27 DIAGNOSIS — R296 Repeated falls: Secondary | ICD-10-CM
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
## 2022-10-29 ENCOUNTER — Ambulatory Visit: Payer: 59

## 2022-11-02 ENCOUNTER — Ambulatory Visit: Payer: 59 | Admitting: Physical Therapy

## 2022-11-02 DIAGNOSIS — M6281 Muscle weakness (generalized): Secondary | ICD-10-CM

## 2022-11-02 DIAGNOSIS — R2681 Unsteadiness on feet: Secondary | ICD-10-CM

## 2022-11-02 DIAGNOSIS — R262 Difficulty in walking, not elsewhere classified: Secondary | ICD-10-CM

## 2022-11-02 DIAGNOSIS — R2689 Other abnormalities of gait and mobility: Secondary | ICD-10-CM

## 2022-11-02 DIAGNOSIS — R269 Unspecified abnormalities of gait and mobility: Secondary | ICD-10-CM
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
## 2022-11-04 ENCOUNTER — Ambulatory Visit: Payer: 59

## 2022-11-05 ENCOUNTER — Ambulatory Visit: Payer: 59

## 2022-11-09 ENCOUNTER — Ambulatory Visit: Payer: 59

## 2022-11-09 DIAGNOSIS — M6281 Muscle weakness (generalized): Secondary | ICD-10-CM

## 2022-11-09 DIAGNOSIS — R269 Unspecified abnormalities of gait and mobility: Secondary | ICD-10-CM

## 2022-11-09 DIAGNOSIS — R2681 Unsteadiness on feet: Secondary | ICD-10-CM

## 2022-11-09 DIAGNOSIS — R2689 Other abnormalities of gait and mobility: Secondary | ICD-10-CM

## 2022-11-09 DIAGNOSIS — R262 Difficulty in walking, not elsewhere classified: Secondary | ICD-10-CM

## 2022-11-09 DIAGNOSIS — R296 Repeated falls: Secondary | ICD-10-CM
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
## 2022-11-11 ENCOUNTER — Ambulatory Visit: Payer: 59 | Admitting: Physical Therapy

## 2022-11-17 ENCOUNTER — Ambulatory Visit: Payer: 59

## 2022-11-19 ENCOUNTER — Ambulatory Visit: Payer: 59 | Attending: Internal Medicine

## 2022-11-19 DIAGNOSIS — R296 Repeated falls: Secondary | ICD-10-CM | POA: Diagnosis present

## 2022-11-19 DIAGNOSIS — R2681 Unsteadiness on feet: Secondary | ICD-10-CM | POA: Insufficient documentation

## 2022-11-19 DIAGNOSIS — R262 Difficulty in walking, not elsewhere classified: Secondary | ICD-10-CM | POA: Insufficient documentation

## 2022-11-19 DIAGNOSIS — M6281 Muscle weakness (generalized): Secondary | ICD-10-CM | POA: Diagnosis present

## 2022-11-19 DIAGNOSIS — R2689 Other abnormalities of gait and mobility: Secondary | ICD-10-CM | POA: Insufficient documentation

## 2022-11-19 DIAGNOSIS — R269 Unspecified abnormalities of gait and mobility: Secondary | ICD-10-CM | POA: Insufficient documentation
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
## 2022-11-23 ENCOUNTER — Ambulatory Visit: Payer: 59 | Admitting: Physical Therapy

## 2022-11-25 ENCOUNTER — Ambulatory Visit: Payer: 59

## 2022-11-30 ENCOUNTER — Ambulatory Visit: Payer: 59 | Admitting: Physical Therapy

## 2022-12-01 ENCOUNTER — Inpatient Hospital Stay: Payer: 59 | Attending: Oncology

## 2022-12-01 ENCOUNTER — Ambulatory Visit: Payer: 59 | Admitting: Physical Therapy

## 2022-12-01 DIAGNOSIS — D472 Monoclonal gammopathy: Secondary | ICD-10-CM | POA: Insufficient documentation

## 2022-12-02 ENCOUNTER — Ambulatory Visit: Payer: 59

## 2022-12-03 ENCOUNTER — Ambulatory Visit: Payer: 59

## 2022-12-03 DIAGNOSIS — R269 Unspecified abnormalities of gait and mobility: Secondary | ICD-10-CM

## 2022-12-03 DIAGNOSIS — R2689 Other abnormalities of gait and mobility: Secondary | ICD-10-CM

## 2022-12-03 DIAGNOSIS — R296 Repeated falls: Secondary | ICD-10-CM

## 2022-12-03 DIAGNOSIS — R262 Difficulty in walking, not elsewhere classified: Secondary | ICD-10-CM

## 2022-12-03 DIAGNOSIS — M6281 Muscle weakness (generalized): Secondary | ICD-10-CM

## 2022-12-03 DIAGNOSIS — R2681 Unsteadiness on feet: Secondary | ICD-10-CM
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
## 2022-12-07 ENCOUNTER — Ambulatory Visit: Payer: 59

## 2022-12-08 ENCOUNTER — Inpatient Hospital Stay: Payer: 59

## 2022-12-08 ENCOUNTER — Ambulatory Visit: Payer: 59 | Admitting: Physical Therapy

## 2022-12-08 ENCOUNTER — Inpatient Hospital Stay: Payer: 59 | Admitting: Oncology

## 2022-12-08 DIAGNOSIS — D472 Monoclonal gammopathy: Secondary | ICD-10-CM | POA: Diagnosis present

## 2022-12-08 DIAGNOSIS — R296 Repeated falls: Secondary | ICD-10-CM

## 2022-12-08 DIAGNOSIS — M6281 Muscle weakness (generalized): Secondary | ICD-10-CM

## 2022-12-08 DIAGNOSIS — R2689 Other abnormalities of gait and mobility: Secondary | ICD-10-CM

## 2022-12-08 DIAGNOSIS — R2681 Unsteadiness on feet: Secondary | ICD-10-CM

## 2022-12-08 DIAGNOSIS — R262 Difficulty in walking, not elsewhere classified: Secondary | ICD-10-CM

## 2022-12-08 DIAGNOSIS — R269 Unspecified abnormalities of gait and mobility: Secondary | ICD-10-CM | POA: Diagnosis not present

## 2022-12-08 LAB — CBC WITH DIFFERENTIAL/PLATELET
Abs Immature Granulocytes: 0.03 10*3/uL (ref 0.00–0.07)
Basophils Absolute: 0 10*3/uL (ref 0.0–0.1)
Basophils Relative: 1 %
Eosinophils Absolute: 0.1 10*3/uL (ref 0.0–0.5)
Eosinophils Relative: 1 %
HCT: 44.2 % (ref 39.0–52.0)
Hemoglobin: 15.5 g/dL (ref 13.0–17.0)
Immature Granulocytes: 1 %
Lymphocytes Relative: 23 %
Lymphs Abs: 1.3 10*3/uL (ref 0.7–4.0)
MCH: 30.5 pg (ref 26.0–34.0)
MCHC: 35.1 g/dL (ref 30.0–36.0)
MCV: 86.8 fL (ref 80.0–100.0)
Monocytes Absolute: 0.5 10*3/uL (ref 0.1–1.0)
Monocytes Relative: 9 %
Neutro Abs: 3.5 10*3/uL (ref 1.7–7.7)
Neutrophils Relative %: 65 %
Platelets: 250 10*3/uL (ref 150–400)
RBC: 5.09 MIL/uL (ref 4.22–5.81)
RDW: 12.7 % (ref 11.5–15.5)
WBC: 5.4 10*3/uL (ref 4.0–10.5)
nRBC: 0 % (ref 0.0–0.2)

## 2022-12-08 LAB — BASIC METABOLIC PANEL
Anion gap: 8 (ref 5–15)
BUN: 10 mg/dL (ref 8–23)
CO2: 22 mmol/L (ref 22–32)
Calcium: 8.9 mg/dL (ref 8.9–10.3)
Chloride: 102 mmol/L (ref 98–111)
Creatinine, Ser: 0.46 mg/dL — ABNORMAL LOW (ref 0.61–1.24)
GFR, Estimated: >60 mL/min (ref >60)
Glucose, Bld: 155 mg/dL — ABNORMAL HIGH (ref 70–99)
Potassium: 3.7 mmol/L (ref 3.5–5.1)
Sodium: 132 mmol/L — ABNORMAL LOW (ref 135–145)
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
## 2022-12-09 ENCOUNTER — Ambulatory Visit: Payer: 59

## 2022-12-09 LAB — IGG, IGA, IGM
IgA: 623 mg/dL — ABNORMAL HIGH (ref 61–437)
IgG (Immunoglobin G), Serum: 579 mg/dL — ABNORMAL LOW (ref 603–1613)
IgM (Immunoglobulin M), Srm: 378 mg/dL — ABNORMAL HIGH (ref 15–143)

## 2022-12-09 LAB — KAPPA/LAMBDA LIGHT CHAINS
Kappa free light chain: 15.9 mg/L (ref 3.3–19.4)
Kappa, lambda light chain ratio: 0.27 (ref 0.26–1.65)
Lambda free light chains: 59.7 mg/L — ABNORMAL HIGH (ref 5.7–26.3)

## 2022-12-10 LAB — PROTEIN ELECTROPHORESIS, SERUM
A/G Ratio: 1.2 (ref 0.7–1.7)
Albumin ELP: 3.7 g/dL (ref 2.9–4.4)
Alpha-1-Globulin: 0.2 g/dL (ref 0.0–0.4)
Alpha-2-Globulin: 0.8 g/dL (ref 0.4–1.0)
Beta Globulin: 1.2 g/dL (ref 0.7–1.3)
Gamma Globulin: 0.7 g/dL (ref 0.4–1.8)
Globulin, Total: 3 g/dL (ref 2.2–3.9)
Total Protein ELP: 6.7 g/dL (ref 6.0–8.5)

## 2022-12-14 ENCOUNTER — Ambulatory Visit: Payer: 59

## 2022-12-15 ENCOUNTER — Inpatient Hospital Stay: Payer: 59 | Attending: Oncology | Admitting: Oncology

## 2022-12-15 DIAGNOSIS — Z79899 Other long term (current) drug therapy: Secondary | ICD-10-CM | POA: Insufficient documentation

## 2022-12-15 DIAGNOSIS — Z7984 Long term (current) use of oral hypoglycemic drugs: Secondary | ICD-10-CM | POA: Insufficient documentation

## 2022-12-15 DIAGNOSIS — D472 Monoclonal gammopathy: Secondary | ICD-10-CM | POA: Insufficient documentation

## 2022-12-15 DIAGNOSIS — J45909 Unspecified asthma, uncomplicated: Secondary | ICD-10-CM | POA: Insufficient documentation

## 2022-12-15 DIAGNOSIS — E039 Hypothyroidism, unspecified: Secondary | ICD-10-CM | POA: Insufficient documentation

## 2022-12-15 DIAGNOSIS — I10 Essential (primary) hypertension: Secondary | ICD-10-CM | POA: Insufficient documentation

## 2022-12-16 ENCOUNTER — Ambulatory Visit: Payer: 59

## 2022-12-17 ENCOUNTER — Ambulatory Visit: Payer: 59 | Attending: Internal Medicine

## 2022-12-17 DIAGNOSIS — R269 Unspecified abnormalities of gait and mobility: Secondary | ICD-10-CM | POA: Diagnosis present

## 2022-12-17 DIAGNOSIS — R2681 Unsteadiness on feet: Secondary | ICD-10-CM | POA: Diagnosis present

## 2022-12-17 DIAGNOSIS — R296 Repeated falls: Secondary | ICD-10-CM | POA: Insufficient documentation

## 2022-12-17 DIAGNOSIS — R2689 Other abnormalities of gait and mobility: Secondary | ICD-10-CM | POA: Diagnosis present

## 2022-12-17 DIAGNOSIS — R262 Difficulty in walking, not elsewhere classified: Secondary | ICD-10-CM | POA: Insufficient documentation

## 2022-12-17 DIAGNOSIS — M6281 Muscle weakness (generalized): Secondary | ICD-10-CM | POA: Insufficient documentation
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
## 2022-12-21 ENCOUNTER — Ambulatory Visit: Payer: 59

## 2022-12-23 ENCOUNTER — Ambulatory Visit: Payer: 59

## 2022-12-24 ENCOUNTER — Inpatient Hospital Stay (HOSPITAL_BASED_OUTPATIENT_CLINIC_OR_DEPARTMENT_OTHER): Payer: 59 | Admitting: Oncology

## 2022-12-24 ENCOUNTER — Encounter: Payer: Self-pay | Admitting: Oncology

## 2022-12-24 VITALS — BP 120/70 | HR 64 | Temp 97.7°F | Resp 16 | Ht 60.0 in | Wt 150.0 lb

## 2022-12-24 DIAGNOSIS — D472 Monoclonal gammopathy: Secondary | ICD-10-CM

## 2022-12-24 DIAGNOSIS — J45909 Unspecified asthma, uncomplicated: Secondary | ICD-10-CM | POA: Diagnosis not present

## 2022-12-24 DIAGNOSIS — Z7984 Long term (current) use of oral hypoglycemic drugs: Secondary | ICD-10-CM | POA: Diagnosis not present

## 2022-12-24 DIAGNOSIS — E039 Hypothyroidism, unspecified: Secondary | ICD-10-CM | POA: Diagnosis not present

## 2022-12-24 DIAGNOSIS — Z79899 Other long term (current) drug therapy: Secondary | ICD-10-CM | POA: Diagnosis not present

## 2022-12-24 DIAGNOSIS — I10 Essential (primary) hypertension: Secondary | ICD-10-CM | POA: Diagnosis not present

## 2022-12-24 NOTE — Progress Notes (Signed)
[facility]  Telephone:(336) [phone] Fax:(336) [phone]  ID: Mathew Aguilar OB: 03/29/1951  MR#: [id number]  QIO#:[id number]#:[id number]  Patient Care Team: Gracelyn NurseJohnston, John D, MD as PCP - General (Internal Medicine)  CHIEF COMPLAINT: MGUS  INTERVAL HISTORY: Patient returns to clinic today for repeat laboratory work and further evaluation.  He continues to feel well and at his baseline. He has no neurologic complaints.  He denies any recent fevers or illnesses.  He has a good appetite and denies weight loss.  He does not complain of pain today.  He has no chest pain, shortness of breath, cough, or hemoptysis.  He denies any nausea, vomiting, constipation, or diarrhea.  He has no urinary complaints.  Patient offers no specific complaints today.  REVIEW OF SYSTEMS:   Review of Systems  Constitutional: Negative.  Negative for fever, malaise/fatigue and weight loss.  Respiratory: Negative.  Negative for cough, hemoptysis and shortness of breath.   Cardiovascular: Negative.  Negative for chest pain and leg swelling.  Gastrointestinal: Negative.  Negative for abdominal pain.  Genitourinary: Negative.  Negative for dysuria.  Musculoskeletal: Negative.  Negative for back pain.  Skin: Negative.  Negative for rash.  Neurological: Negative.  Negative for dizziness, focal weakness, weakness and headaches.  Psychiatric/Behavioral: Negative.  The patient is not nervous/anxious.     As per HPI. Otherwise, a complete review of systems is negative.  PAST MEDICAL HISTORY: Past Medical History:  Diagnosis Date   Anxiety    Asthma    Borderline personality disorder    Depression    Dysrhythmia    Erectile dysfunction    Fatty liver    GERD (gastroesophageal reflux disease)    Hypertension    Hypothyroidism    Pulmonary nodule, right    Restless leg    Sleep apnea    Suicide attempt     PAST SURGICAL HISTORY: Past Surgical History:  Procedure Laterality Date   AMPUTATION ARM      COLONOSCOPY     COLONOSCOPY WITH PROPOFOL N/A 08/22/2018   Procedure: COLONOSCOPY WITH PROPOFOL;  Surgeon: Christena DeemSkulskie, Martin U, MD;  Location: [facility] ENDOSCOPY;  Service: Endoscopy;  Laterality: N/A;    FAMILY HISTORY: Family History  Family history unknown: Yes    ADVANCED DIRECTIVES (Y/N):  N  HEALTH MAINTENANCE: Social History   Tobacco Use   Smoking status: Never   Smokeless tobacco: Current    Types: Chew  Vaping Use   Vaping Use: Never used  Substance Use Topics   Alcohol use: Yes    Comment: history of ETOH abuse - sober several months   Drug use: No     Colonoscopy:  PAP:  Bone density:  Lipid panel:  No Known Allergies  Current Outpatient Medications  Medication Sig Dispense Refill   atenolol (TENORMIN) 25 MG tablet Take 25 mg by mouth daily.     doxazosin (CARDURA) 8 MG tablet Take 1 tablet (8 mg total) by mouth daily. 90 tablet 3   DULoxetine (CYMBALTA) 60 MG capsule Take 2 capsules (120 mg total) by mouth daily. (Patient taking differently: Take 60 mg by mouth 2 (two) times daily.) 60 capsule 0   finasteride (PROSCAR) 5 MG tablet Take 1 tablet (5 mg total) by mouth daily. 90 tablet 3   gabapentin (NEURONTIN) 100 MG capsule Take 100 mg by mouth 3 (three) times daily.     GEMTESA 75 MG TABS TAKE 1 TABLET BY MOUTH ONCE DAILY 30 tablet 12   hydrochlorothiazide (MICROZIDE) 12.5 MG  capsule Take 1 capsule (12.5 mg total) by mouth daily. 30 capsule 0   lamoTRIgine (LAMICTAL) 100 MG tablet Take 50-100 mg by mouth daily. Take 50 mg by mouth every morning and 100 mg by mouth at bedtime for two days then take 100 mg by mouth daily thereafter.     oxybutynin (DITROPAN-XL) 10 MG 24 hr tablet TAKE 1 TABLET(10 MG) BY MOUTH DAILY 90 tablet 3   oxybutynin (DITROPAN-XL) 10 MG 24 hr tablet TAKE 1 TABLET(10 MG) BY MOUTH DAILY 90 tablet 3   pramipexole (MIRAPEX) 1 MG tablet Take by mouth.     primidone (MYSOLINE) 50 MG tablet Take by mouth at bedtime.     rOPINIRole (REQUIP) 1 MG  tablet Take 1 mg by mouth 3 (three) times daily.     ABILIFY MAINTENA 400 MG SRER Inject 400 mg into the skin every 30 (thirty) days. (Patient not taking: Reported on 12/24/2022)     albuterol (PROVENTIL HFA;VENTOLIN HFA) 108 (90 Base) MCG/ACT inhaler Inhale 2 puffs into the lungs every 6 (six) hours as needed for wheezing or shortness of breath. (Patient not taking: Reported on 09/08/2022) 1 Inhaler 0   doxepin (SINEQUAN) 25 MG capsule Take 25 mg by mouth at bedtime. (Patient not taking: Reported on 09/08/2022)     ergocalciferol (VITAMIN D2) 1.25 MG (50000 UT) capsule Take 1 capsule by mouth once a week. (Patient not taking: Reported on 09/08/2022)     HYDROcodone-acetaminophen (NORCO/VICODIN) 5-325 MG tablet Take 1 tablet by mouth every 6 (six) hours as needed for moderate pain or severe pain. (Patient not taking: Reported on 09/08/2022) 6 tablet 0   levocetirizine (XYZAL) 5 MG tablet Take 5 mg by mouth every evening. (Patient not taking: Reported on 09/08/2022)     levothyroxine (SYNTHROID, LEVOTHROID) 75 MCG tablet Take 1 tablet (75 mcg total) by mouth daily before breakfast. (Patient not taking: Reported on 12/24/2022) 30 tablet 0   loratadine (CLARITIN) 10 MG tablet Take 1 tablet (10 mg total) by mouth daily. (Patient not taking: Reported on 09/08/2022) 30 tablet 0   Melatonin 3 MG TABS Take 3 mg by mouth at bedtime as needed. (Patient not taking: Reported on 09/08/2022)     metFORMIN (GLUCOPHAGE) 500 MG tablet Take 1 tablet (500 mg total) by mouth daily. (Patient not taking: Reported on 12/24/2022) 30 tablet 0   mirtazapine (REMERON) 15 MG tablet Take 15 mg by mouth at bedtime. (Patient not taking: Reported on 09/08/2022)     mirtazapine (REMERON) 30 MG tablet Take 30 mg by mouth at bedtime. (Patient not taking: Reported on 09/08/2022)     nystatin ointment (MYCOSTATIN) Apply 1 application topically 2 (two) times daily. (Patient not taking: Reported on 09/08/2022) 30 g 0   nystatin-triamcinolone  ointment (MYCOLOG) Apply 1 application topically 2 (two) times daily. (Patient not taking: Reported on 09/08/2022) 30 g 0   omega-3 acid ethyl esters (LOVAZA) 1 g capsule Take 1 g by mouth daily. (Patient not taking: Reported on 09/08/2022)     omeprazole (PRILOSEC) 20 MG capsule Take 20 mg by mouth daily. (Patient not taking: Reported on 12/24/2022)     rosuvastatin (CRESTOR) 10 MG tablet Take 10 mg by mouth at bedtime. (Patient not taking: Reported on 09/08/2022)     sulfamethoxazole-trimethoprim (BACTRIM DS) 800-160 MG tablet Take 1 tablet by mouth every 12 (twelve) hours. (Patient not taking: Reported on 09/08/2022) 14 tablet 0   tadalafil (CIALIS) 20 MG tablet Take by mouth. (Patient not taking: Reported on  09/08/2022)     temazepam (RESTORIL) 15 MG capsule Take 15 mg by mouth daily. (Patient not taking: Reported on 09/08/2022)     traZODone (DESYREL) 100 MG tablet Take 100 mg by mouth at bedtime. (Patient not taking: Reported on 09/08/2022)     triamcinolone (KENALOG) 0.025 % cream Apply 1 application topically 2 (two) times daily. (Patient not taking: Reported on 09/08/2022) 30 g 0   No current facility-administered medications for this visit.    OBJECTIVE: Vitals:   12/24/22 1023  BP: 120/70  Pulse: 64  Resp: 16  Temp: 97.7 F (36.5 C)  SpO2: 95%     Body mass index is 29.29 kg/m.    ECOG FS:0 - Asymptomatic  General: Well-developed, well-nourished, no acute distress. Eyes: Pink conjunctiva, anicteric sclera. HEENT: Normocephalic, moist mucous membranes. Lungs: No audible wheezing or coughing. Heart: Regular rate and rhythm. Abdomen: Soft, nontender, no obvious distention. Musculoskeletal: No edema, cyanosis, or clubbing. Neuro: Alert, answering all questions appropriately. Cranial nerves grossly intact. Skin: No rashes or petechiae noted. Psych: Normal affect.  LAB RESULTS:  Lab Results  Component Value Date   NA 132 (L) 12/08/2022   K 3.7 12/08/2022   CL 102  12/08/2022   CO2 22 12/08/2022   GLUCOSE 155 (H) 12/08/2022   BUN 10 12/08/2022   CREATININE 0.46 (L) 12/08/2022   CALCIUM 8.9 12/08/2022   PROT 7.9 09/12/2020   ALBUMIN 3.9 09/12/2020   AST 32 09/12/2020   ALT 47 (H) 09/12/2020   ALKPHOS 39 09/12/2020   BILITOT 1.6 (H) 09/12/2020   GFRNONAA >60 12/08/2022   GFRAA >60 07/09/2019    Lab Results  Component Value Date   WBC 5.4 12/08/2022   NEUTROABS 3.5 12/08/2022   HGB 15.5 12/08/2022   HCT 44.2 12/08/2022   MCV 86.8 12/08/2022   PLT 250 12/08/2022     STUDIES: No results found.  ASSESSMENT: MGUS  PLAN:    MGUS: Patient's SPEP no longer reveals an M spike.  He continues to have a mildly elevated IgM level of 378 and a kappa free light chain of 59.7.  Both are essentially stable since September 08, 2022.  He has no evidence of endorgan damage.  No intervention is needed at this time.  Patient does not require bone marrow biopsy.  Return to clinic in 6 months for routine evaluation.    I provided 20 minutes of face-to-face video visit time during this encounter which included chart review, counseling, and coordination of care as documented above.    Patient expressed understanding and was in agreement with this plan. He also understands that He can call clinic at any time with any questions, concerns, or complaints.    Jeralyn Ruths, MD   12/24/2022 11:06 AM

## 2022-12-28 ENCOUNTER — Ambulatory Visit: Payer: 59

## 2022-12-29 ENCOUNTER — Encounter: Payer: Self-pay | Admitting: Physical Therapy

## 2022-12-29 ENCOUNTER — Ambulatory Visit: Payer: 59 | Admitting: Physical Therapy

## 2022-12-29 DIAGNOSIS — R2689 Other abnormalities of gait and mobility: Secondary | ICD-10-CM

## 2022-12-29 DIAGNOSIS — R262 Difficulty in walking, not elsewhere classified: Secondary | ICD-10-CM

## 2022-12-29 DIAGNOSIS — R269 Unspecified abnormalities of gait and mobility: Secondary | ICD-10-CM

## 2022-12-29 DIAGNOSIS — M6281 Muscle weakness (generalized): Secondary | ICD-10-CM

## 2022-12-29 DIAGNOSIS — R2681 Unsteadiness on feet: Secondary | ICD-10-CM
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
## 2022-12-30 ENCOUNTER — Ambulatory Visit: Payer: 59

## 2023-01-04 ENCOUNTER — Ambulatory Visit: Payer: 59

## 2023-01-06 ENCOUNTER — Ambulatory Visit: Payer: 59

## 2023-01-11 ENCOUNTER — Ambulatory Visit: Payer: 59

## 2023-01-13 ENCOUNTER — Ambulatory Visit: Payer: 59 | Attending: Internal Medicine

## 2023-01-13 DIAGNOSIS — R262 Difficulty in walking, not elsewhere classified: Secondary | ICD-10-CM | POA: Insufficient documentation

## 2023-01-13 DIAGNOSIS — R296 Repeated falls: Secondary | ICD-10-CM | POA: Insufficient documentation

## 2023-01-13 DIAGNOSIS — R269 Unspecified abnormalities of gait and mobility: Secondary | ICD-10-CM | POA: Insufficient documentation

## 2023-01-13 DIAGNOSIS — R2681 Unsteadiness on feet: Secondary | ICD-10-CM | POA: Insufficient documentation

## 2023-01-13 DIAGNOSIS — R2689 Other abnormalities of gait and mobility: Secondary | ICD-10-CM | POA: Insufficient documentation

## 2023-01-13 DIAGNOSIS — M6281 Muscle weakness (generalized): Secondary | ICD-10-CM | POA: Insufficient documentation

## 2023-01-18 ENCOUNTER — Ambulatory Visit: Payer: 59

## 2023-01-20 ENCOUNTER — Ambulatory Visit: Payer: 59

## 2023-01-20 DIAGNOSIS — R269 Unspecified abnormalities of gait and mobility: Secondary | ICD-10-CM | POA: Diagnosis present

## 2023-01-20 DIAGNOSIS — R2689 Other abnormalities of gait and mobility: Secondary | ICD-10-CM | POA: Diagnosis present

## 2023-01-20 DIAGNOSIS — R2681 Unsteadiness on feet: Secondary | ICD-10-CM

## 2023-01-20 DIAGNOSIS — R262 Difficulty in walking, not elsewhere classified: Secondary | ICD-10-CM

## 2023-01-20 DIAGNOSIS — R296 Repeated falls: Secondary | ICD-10-CM

## 2023-01-20 DIAGNOSIS — M6281 Muscle weakness (generalized): Secondary | ICD-10-CM
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
## 2023-01-25 ENCOUNTER — Ambulatory Visit: Payer: 59

## 2023-01-27 ENCOUNTER — Ambulatory Visit: Payer: 59

## 2023-02-01 ENCOUNTER — Ambulatory Visit: Payer: 59

## 2023-02-03 ENCOUNTER — Ambulatory Visit: Payer: 59

## 2023-05-18 ENCOUNTER — Other Ambulatory Visit: Payer: Medicare Other

## 2023-05-19 NOTE — Progress Notes (Deleted)
05/19/23 9:58 AM   Mathew Aguilar 1951-01-19 [id number]  Referring provider:  Gracelyn Nurse, MD [address] [place],  [place] [address]  Urological history  1. BPH with LU TS - PSA (11/2022) 2.11 - cysto 2020 - Normal prostate without significant coaptation, some bleeding with manipulation - Calculated prostate volume in 2016 was 69.8 based on sizes of 5.09 x 5.47 x 4.57 cm on CT scan - doxazosin 8 mg daily and finasteride 5 mg daily   2. Urgency -Contributing factors of age, BPH, carbonated drinks, artificial sweeteners, caffeine, hypertension, diabetes, alcohol consumption, depression, diuretics, COPD and restless leg - cysto 06/2019 with Dr. Apolinar Junes No significant urinary obstruction visually on cystoscopy.  No underlying pathology including tumors, masses, lesions or stones - managed with oxybutynin XL 10 mg daily and Gemtesa 75 mg daily   3. Right renal complex cyst - CT w/wo 2016 right mid kidney lesion remains indistinct, and is too small to technically characterize. It may well be complex, but I cannot tell if it is enhancing and accordingly I can't confidently assign a Bosniak classification. The lack of significant progression compared to 04/09/13 is somewhat reassuring against malignancy - RUS 06/2019 Normal renal ultrasound  4. ED -Contributing factors of age, BPH, hypertension, diabetes, alcohol consumption, depression, COPD, alcohol consumption, blood pressure medication and BPH medication -Managed with tadalafil 20 mg on demand dosing  No chief complaint on file.    HPI: Mathew Aguilar is a 72 y.o.male who presents today for a follow-up.    Previous records reviewed.   I PSS ***     Score:  1-7 Mild 8-19 Moderate 20-35 Severe   PMH: Past Medical History:  Diagnosis Date   Anxiety    Asthma    Borderline personality disorder (HCC)    Depression    Dysrhythmia    Erectile dysfunction    Fatty liver    GERD (gastroesophageal reflux  disease)    Hypertension    Hypothyroidism    Pulmonary nodule, right    Restless leg    Sleep apnea    Suicide attempt [facility])     Surgical History: Past Surgical History:  Procedure Laterality Date   AMPUTATION ARM     COLONOSCOPY     COLONOSCOPY WITH PROPOFOL N/A 08/22/2018   Procedure: COLONOSCOPY WITH PROPOFOL;  Surgeon: Christena Deem, MD;  Location: [facility] ENDOSCOPY;  Service: Endoscopy;  Laterality: N/A;    Home Medications:  Allergies as of 05/20/2023   No Known Allergies      Medication List        Accurate as of May 19, 2023  9:58 AM. If you have any questions, ask your nurse or doctor.          Abilify Maintena 400 MG Srer injection Generic drug: ARIPiprazole ER Inject 400 mg into the skin every 30 (thirty) days.   albuterol 108 (90 Base) MCG/ACT inhaler Commonly known as: VENTOLIN HFA Inhale 2 puffs into the lungs every 6 (six) hours as needed for wheezing or shortness of breath.   atenolol 25 MG tablet Commonly known as: TENORMIN Take 25 mg by mouth daily.   doxazosin 8 MG tablet Commonly known as: CARDURA Take 1 tablet (8 mg total) by mouth daily.   doxepin 25 MG capsule Commonly known as: SINEQUAN Take 25 mg by mouth at bedtime.   DULoxetine 60 MG capsule Commonly known as: CYMBALTA Take 2 capsules (120 mg total) by mouth daily. What changed:  how much  to take when to take this   ergocalciferol 1.25 MG (50000 UT) capsule Commonly known as: VITAMIN D2 Take 1 capsule by mouth once a week.   finasteride 5 MG tablet Commonly known as: PROSCAR Take 1 tablet (5 mg total) by mouth daily.   gabapentin 100 MG capsule Commonly known as: NEURONTIN Take 100 mg by mouth 3 (three) times daily.   Gemtesa 75 MG Tabs Generic drug: Vibegron TAKE 1 TABLET BY MOUTH ONCE DAILY   hydrochlorothiazide 12.5 MG capsule Commonly known as: MICROZIDE Take 1 capsule (12.5 mg total) by mouth daily.   HYDROcodone-acetaminophen 5-325 MG  tablet Commonly known as: NORCO/VICODIN Take 1 tablet by mouth every 6 (six) hours as needed for moderate pain or severe pain.   lamoTRIgine 100 MG tablet Commonly known as: LAMICTAL Take 50-100 mg by mouth daily. Take 50 mg by mouth every morning and 100 mg by mouth at bedtime for two days then take 100 mg by mouth daily thereafter.   levocetirizine 5 MG tablet Commonly known as: XYZAL Take 5 mg by mouth every evening.   levothyroxine 75 MCG tablet Commonly known as: SYNTHROID Take 1 tablet (75 mcg total) by mouth daily before breakfast.   loratadine 10 MG tablet Commonly known as: CLARITIN Take 1 tablet (10 mg total) by mouth daily.   melatonin 3 MG Tabs tablet Take 3 mg by mouth at bedtime as needed.   metFORMIN 500 MG tablet Commonly known as: GLUCOPHAGE Take 1 tablet (500 mg total) by mouth daily.   mirtazapine 15 MG tablet Commonly known as: REMERON Take 15 mg by mouth at bedtime.   mirtazapine 30 MG tablet Commonly known as: REMERON Take 30 mg by mouth at bedtime.   nystatin ointment Commonly known as: MYCOSTATIN Apply 1 application topically 2 (two) times daily.   nystatin-triamcinolone ointment Commonly known as: MYCOLOG Apply 1 application topically 2 (two) times daily.   omega-3 acid ethyl esters 1 g capsule Commonly known as: LOVAZA Take 1 g by mouth daily.   omeprazole 20 MG capsule Commonly known as: PRILOSEC Take 20 mg by mouth daily.   oxybutynin 10 MG 24 hr tablet Commonly known as: DITROPAN-XL TAKE 1 TABLET(10 MG) BY MOUTH DAILY   oxybutynin 10 MG 24 hr tablet Commonly known as: DITROPAN-XL TAKE 1 TABLET(10 MG) BY MOUTH DAILY   pramipexole 1 MG tablet Commonly known as: MIRAPEX Take by mouth.   primidone 50 MG tablet Commonly known as: MYSOLINE Take by mouth at bedtime.   rOPINIRole 1 MG tablet Commonly known as: REQUIP Take 1 mg by mouth 3 (three) times daily.   rosuvastatin 10 MG tablet Commonly known as: CRESTOR Take 10 mg  by mouth at bedtime.   sulfamethoxazole-trimethoprim 800-160 MG tablet Commonly known as: BACTRIM DS Take 1 tablet by mouth every 12 (twelve) hours.   tadalafil 20 MG tablet Commonly known as: CIALIS Take by mouth.   temazepam 15 MG capsule Commonly known as: RESTORIL Take 15 mg by mouth daily.   traZODone 100 MG tablet Commonly known as: DESYREL Take 100 mg by mouth at bedtime.   triamcinolone 0.025 % cream Commonly known as: KENALOG Apply 1 application topically 2 (two) times daily.        Allergies:  No Known Allergies  Family History: Family History  Family history unknown: Yes    Social History:  reports that he has never smoked. His smokeless tobacco use includes chew. He reports current alcohol use. He reports that he does not  use drugs.   Physical Exam: There were no vitals taken for this visit.  GU: No CVA tenderness.  No bladder fullness or masses.  Patient with circumcised/uncircumcised phallus. ***Foreskin easily retracted***  Urethral meatus is patent.  No penile discharge. No penile lesions or rashes. Scrotum without lesions, cysts, rashes and/or edema.  Testicles are located scrotally bilaterally. No masses are appreciated in the testicles. Left and right epididymis are normal. Rectal: Patient with  normal sphincter tone. Anus and perineum without scarring or rashes. No rectal masses are appreciated. Prostate is approximately *** grams, *** nodules are appreciated. Seminal vesicles are normal.   Laboratory Data: PSA, Total (Screen) Order: [id number] Component Ref Range & Units 5 mo ago  PSA (Prostate Specific Antigen), Total 0.10 - 4.00 ng/mL 2.11  Resulting Agency [facility] - LAB  Narrative Performed by [facility] - LAB Test results were determined with Beckman Coulter Hybritech Assay. Values obtained with different assay methods cannot be used interchangeably in serial testing. Assay results should not be interpreted as absolute  evidence of the presence or absence of malignant disease  Specimen Collected: 12/02/22 08:11   Performed by: [facility] Last Resulted: 12/02/22 10:59  Received From: [facility]  Result Received: 12/03/22 07:53   Hemoglobin A1C Order: [id number] Component Ref Range & Units 5 mo ago  Hemoglobin A1C 4.2 - 5.6 % 7.9 High   Average Blood Glucose (Calc) mg/dL 782  Resulting Agency [facility]  Narrative Performed by [facility] Normal Range:    4.2 - 5.6% Increased Risk:  5.7 - 6.4% Diabetes:        >= 6.5% Glycemic Control for adults with diabetes:  <7%    Specimen Collected: 12/02/22 08:11   Performed by: [facility] Last Resulted: 12/02/22 09:12  Received From: [facility]  Result Received: 12/03/22 07:53    CBC    Component Value Date/Time   WBC 5.4 12/08/2022 1107   RBC 5.09 12/08/2022 1107   HGB 15.5 12/08/2022 1107   HGB 17.3 12/05/2014 1551   HCT 44.2 12/08/2022 1107   HCT 50.3 12/05/2014 1551   PLT 250 12/08/2022 1107   PLT 267 12/05/2014 1551   MCV 86.8 12/08/2022 1107   MCV 91 12/05/2014 1551   MCH 30.5 12/08/2022 1107   MCHC 35.1 12/08/2022 1107   RDW 12.7 12/08/2022 1107   RDW 13.3 12/05/2014 1551   LYMPHSABS 1.3 12/08/2022 1107   LYMPHSABS 1.3 08/13/2013 1222   MONOABS 0.5 12/08/2022 1107   MONOABS 0.5 08/13/2013 1222   EOSABS 0.1 12/08/2022 1107   EOSABS 0.1 08/13/2013 1222   BASOSABS 0.0 12/08/2022 1107   BASOSABS 0.1 08/13/2013 1222    Comprehensive Metabolic Panel (CMP) Order: [id number] Component Ref Range & Units 5 mo ago  Glucose 70 - 110 mg/dL 578 High   Sodium 469 - 145 mmol/L 134 Low   Potassium 3.6 - 5.1 mmol/L 4.5  Chloride 97 - 109 mmol/L 102  Carbon Dioxide (CO2) 22.0 - 32.0 mmol/L 20.6 Low   Urea Nitrogen (BUN) 7 - 25 mg/dL 11  Creatinine 0.7 - 1.3 mg/dL 0.6 Low   Glomerular Filtration Rate (eGFR) >60 mL/min/1.73sq m 103   Comment: CKD-EPI (2021) does not include patient's race in the calculation of eGFR.  Monitoring changes of plasma creatinine and eGFR over time is useful for monitoring kidney function.  Interpretive Ranges for eGFR (CKD-EPI 2021):  eGFR:       >60 mL/min/1.73 sq. m - Normal eGFR:       30-59 mL/min/1.73 sq. m - Moderately Decreased eGFR:       15-29 mL/min/1.73 sq. m  - Severely Decreased eGFR:       < 15 mL/min/1.73 sq. m  - Kidney Failure   Note: These eGFR calculations do not apply in acute situations when eGFR is changing rapidly or patients on dialysis.  Calcium 8.7 - 10.3 mg/dL 9.3  AST 8 - 39 U/L 21  ALT 6 - 57 U/L 34  Alk Phos (alkaline Phosphatase) 34 - 104 U/L 52  Albumin 3.5 - 4.8 g/dL 4.1  Bilirubin, Total 0.3 - 1.2 mg/dL 1.3 High   Protein, Total 6.1 - 7.9 g/dL 6.9  A/G Ratio 1.0 - 5.0 gm/dL 1.5  Resulting Agency [facility]   Specimen Collected: 12/02/22 08:11   Performed by: [facility] Last Resulted: 12/02/22 12:35  Received From: [facility]  Result Received: 12/03/22 07:53    Thyroid Stimulating-Hormone (TSH) Order: [id number] Component Ref Range & Units 5 mo ago  Thyroid Stimulating Hormone (TSH) 0.450-5.330 uIU/ml uIU/mL 6.075 High   Resulting Agency [facility] - LAB   Specimen Collected: 12/02/22 08:11   Performed by: [facility] Last Resulted: 12/02/22 10:59  Received From: [facility]  Result Received: 12/03/22 07:53    Lipid Panel w/calc LDL Order: [id number] Component Ref Range & Units 5 mo ago  Cholesterol, Total 100 - 200 mg/dL 782  Triglyceride 35 - 199 mg/dL 956  HDL (High Density Lipoprotein) Cholesterol 29.0 - 71.0 mg/dL 21.3  LDL Calculated 0 - 130 mg/dL 086  VLDL Cholesterol mg/dL 20  Cholesterol/HDL Ratio 3.8  Resulting Agency [facility]   Specimen Collected: 12/02/22 08:11   Performed by: [facility]  [facility] Last Resulted: 12/02/22 12:26  Received From: [facility]  Result Received: 12/03/22 07:53   Urinalysis: Urinalysis w/Microscopic Order: [id number] Component Ref Range & Units 5 mo ago  Color Colorless, Straw, Light Yellow, Yellow, Dark Yellow Yellow  Clarity Clear Clear  Specific Gravity 1.005 - 1.030 1.021  pH, Urine 5.0 - 8.0 6.0  Protein, Urinalysis Negative mg/dL Trace Abnormal   Glucose, Urinalysis Negative mg/dL 3+ Abnormal   Ketones, Urinalysis Negative mg/dL Negative  Blood, Urinalysis Negative Negative  Nitrite, Urinalysis Negative Negative  Leukocyte Esterase, Urinalysis Negative Negative  Bilirubin, Urinalysis Negative Negative  Urobilinogen, Urinalysis 0.2 - 1.0 mg/dL 2.0 High   WBC, UA <=5 /hpf 1  Red Blood Cells, Urinalysis <=3 /hpf 1  Bacteria, Urinalysis 0 - 5 /hpf 0-5  Squamous Epithelial Cells, Urinalysis /hpf 0  Resulting Agency [facility]   Specimen Collected: 12/02/22 08:11   Performed by: [facility] Last Resulted: 12/02/22 08:37  Received From: [facility]  Result Received: 12/03/22 07:53  I have reviewed the labs.    Pertinent Imaging: N/A   Assessment & Plan:    1. BPH with LU TS -PVR minimal -continues doxazosin 8 mg daily and finasteride 5 mg daily   2. Urgency/frequency -Resolved with the cessation of beer consumption -Congratulated him on his stopping his beer intake and encouraged him to continue abstaining  No follow-ups on file.  Cloretta Ned   [facility] [address] [place], [place] [address] [phone]

## 2023-05-20 ENCOUNTER — Ambulatory Visit: Payer: Medicare Other | Admitting: Urology

## 2023-05-20 ENCOUNTER — Encounter: Payer: Self-pay | Admitting: Urology

## 2023-05-20 DIAGNOSIS — N138 Other obstructive and reflux uropathy: Secondary | ICD-10-CM

## 2023-05-20 DIAGNOSIS — R350 Frequency of micturition: Secondary | ICD-10-CM

## 2023-05-21 ENCOUNTER — Ambulatory Visit: Payer: Medicare Other | Admitting: Urology

## 2023-06-25 ENCOUNTER — Inpatient Hospital Stay: Payer: 59 | Attending: Oncology

## 2023-07-02 ENCOUNTER — Inpatient Hospital Stay: Payer: 59 | Admitting: Oncology

## 2023-07-08 ENCOUNTER — Inpatient Hospital Stay: Payer: 59

## 2023-07-15 ENCOUNTER — Encounter: Payer: Self-pay | Admitting: Oncology

## 2023-07-15 ENCOUNTER — Inpatient Hospital Stay: Payer: 59 | Admitting: Oncology

## 2023-08-17 ENCOUNTER — Inpatient Hospital Stay: Payer: 59 | Attending: Oncology

## 2023-08-17 DIAGNOSIS — Z79899 Other long term (current) drug therapy: Secondary | ICD-10-CM | POA: Diagnosis not present

## 2023-08-17 DIAGNOSIS — D472 Monoclonal gammopathy: Secondary | ICD-10-CM | POA: Diagnosis present

## 2023-08-17 LAB — CBC WITH DIFFERENTIAL/PLATELET
Abs Immature Granulocytes: 0.02 10*3/uL (ref 0.00–0.07)
Basophils Absolute: 0.1 10*3/uL (ref 0.0–0.1)
Basophils Relative: 1 %
Eosinophils Absolute: 0.1 10*3/uL (ref 0.0–0.5)
Eosinophils Relative: 2 %
HCT: 47.5 % (ref 39.0–52.0)
Hemoglobin: 16.7 g/dL (ref 13.0–17.0)
Immature Granulocytes: 0 %
Lymphocytes Relative: 21 %
Lymphs Abs: 1.1 10*3/uL (ref 0.7–4.0)
MCH: 30.8 pg (ref 26.0–34.0)
MCHC: 35.2 g/dL (ref 30.0–36.0)
MCV: 87.6 fL (ref 80.0–100.0)
Monocytes Absolute: 0.4 10*3/uL (ref 0.1–1.0)
Monocytes Relative: 8 %
Neutro Abs: 3.6 10*3/uL (ref 1.7–7.7)
Neutrophils Relative %: 68 %
Platelets: 298 10*3/uL (ref 150–400)
RBC: 5.42 MIL/uL (ref 4.22–5.81)
RDW: 12.5 % (ref 11.5–15.5)
WBC: 5.2 10*3/uL (ref 4.0–10.5)
nRBC: 0 % (ref 0.0–0.2)

## 2023-08-17 LAB — BASIC METABOLIC PANEL - CANCER CENTER ONLY
Anion gap: 11 (ref 5–15)
BUN: 14 mg/dL (ref 8–23)
CO2: 22 mmol/L (ref 22–32)
Calcium: 9 mg/dL (ref 8.9–10.3)
Chloride: 101 mmol/L (ref 98–111)
Creatinine: 0.67 mg/dL (ref 0.61–1.24)
GFR, Estimated: >60 mL/min (ref >60)
Glucose, Bld: 261 mg/dL — ABNORMAL HIGH (ref 70–99)
Potassium: 4 mmol/L (ref 3.5–5.1)
Sodium: 134 mmol/L — ABNORMAL LOW (ref 135–145)

## 2023-08-18 ENCOUNTER — Inpatient Hospital Stay: Payer: 59

## 2023-08-18 ENCOUNTER — Other Ambulatory Visit: Payer: 59

## 2023-08-18 LAB — IGG, IGA, IGM
IgA: 624 mg/dL — ABNORMAL HIGH (ref 61–437)
IgG (Immunoglobin G), Serum: 564 mg/dL — ABNORMAL LOW (ref 603–1613)
IgM (Immunoglobulin M), Srm: 377 mg/dL — ABNORMAL HIGH (ref 15–143)

## 2023-08-18 LAB — KAPPA/LAMBDA LIGHT CHAINS
Kappa free light chain: 14.6 mg/L (ref 3.3–19.4)
Kappa, lambda light chain ratio: 0.23 — ABNORMAL LOW (ref 0.26–1.65)
Lambda free light chains: 62.2 mg/L — ABNORMAL HIGH (ref 5.7–26.3)

## 2023-08-21 LAB — PROTEIN ELECTROPHORESIS, SERUM
A/G Ratio: 1.2 (ref 0.7–1.7)
Albumin ELP: 3.9 g/dL (ref 2.9–4.4)
Alpha-1-Globulin: 0.2 g/dL (ref 0.0–0.4)
Alpha-2-Globulin: 0.9 g/dL (ref 0.4–1.0)
Beta Globulin: 1.4 g/dL — ABNORMAL HIGH (ref 0.7–1.3)
Gamma Globulin: 0.8 g/dL (ref 0.4–1.8)
Globulin, Total: 3.3 g/dL (ref 2.2–3.9)
M-Spike, %: 0.5 g/dL — ABNORMAL HIGH
Total Protein ELP: 7.2 g/dL (ref 6.0–8.5)

## 2023-08-25 ENCOUNTER — Encounter: Payer: Self-pay | Admitting: Oncology

## 2023-08-25 ENCOUNTER — Inpatient Hospital Stay (HOSPITAL_BASED_OUTPATIENT_CLINIC_OR_DEPARTMENT_OTHER): Payer: 59 | Admitting: Oncology

## 2023-08-25 VITALS — BP 111/79 | HR 75 | Temp 98.0°F | Resp 16 | Ht 60.0 in | Wt 143.0 lb

## 2023-08-25 DIAGNOSIS — D472 Monoclonal gammopathy: Secondary | ICD-10-CM

## 2023-08-25 NOTE — Progress Notes (Signed)
[facility]  [phone]) [phone] [phone]  ID: Mathew Aguilar OB: 1950-09-28  MR#: [id number]  AOZ#:[id number]  Patient Care Team: Gracelyn Nurse, MD as PCP - General (Internal Medicine) Jeralyn Ruths, MD as Consulting Physician (Oncology)  CHIEF COMPLAINT: MGUS  INTERVAL HISTORY: Patient returns to clinic today for repeat laboratory work and routine 66-month evaluation.  He continues to feel well and remains asymptomatic. He has no neurologic complaints.  He denies any recent fevers or illnesses.  He has a good appetite and denies weight loss.  He does not complain of pain today.  He has no chest pain, shortness of breath, cough, or hemoptysis.  He denies any nausea, vomiting, constipation, or diarrhea.  He has no urinary complaints.  Patient offers no specific complaints today.  REVIEW OF SYSTEMS:   Review of Systems  Constitutional: Negative.  Negative for fever, malaise/fatigue and weight loss.  Respiratory: Negative.  Negative for cough, hemoptysis and shortness of breath.   Cardiovascular: Negative.  Negative for chest pain and leg swelling.  Gastrointestinal: Negative.  Negative for abdominal pain.  Genitourinary: Negative.  Negative for dysuria.  Musculoskeletal: Negative.  Negative for back pain.  Skin: Negative.  Negative for rash.  Neurological: Negative.  Negative for dizziness, focal weakness, weakness and headaches.  Psychiatric/Behavioral: Negative.  The patient is not nervous/anxious.     As per HPI. Otherwise, a complete review of systems is negative.  PAST MEDICAL HISTORY: Past Medical History:  Diagnosis Date   Anxiety    Asthma    Borderline personality disorder (HCC)    Depression    Dysrhythmia    Erectile dysfunction    Fatty liver    GERD (gastroesophageal reflux disease)    Hypertension    Hypothyroidism    Pulmonary nodule, right    Restless leg    Sleep apnea    Suicide attempt (HCC)     PAST  SURGICAL HISTORY: Past Surgical History:  Procedure Laterality Date   AMPUTATION ARM     COLONOSCOPY     COLONOSCOPY WITH PROPOFOL N/A 08/22/2018   Procedure: COLONOSCOPY WITH PROPOFOL;  Surgeon: Christena Deem, MD;  Location: [facility] ENDOSCOPY;  Service: Endoscopy;  Laterality: N/A;    FAMILY HISTORY: Family History  Family history unknown: Yes    ADVANCED DIRECTIVES (Y/N):  N  HEALTH MAINTENANCE: Social History   Tobacco Use   Smoking status: Never   Smokeless tobacco: Current    Types: Chew  Vaping Use   Vaping status: Never Used  Substance Use Topics   Alcohol use: Yes    Comment: history of ETOH abuse - sober several months   Drug use: No     Colonoscopy:  PAP:  Bone density:  Lipid panel:  No Known Allergies  Current Outpatient Medications  Medication Sig Dispense Refill   atenolol (TENORMIN) 25 MG tablet Take 25 mg by mouth daily.     doxazosin (CARDURA) 8 MG tablet Take 1 tablet (8 mg total) by mouth daily. 90 tablet 3   finasteride (PROSCAR) 5 MG tablet Take 1 tablet (5 mg total) by mouth daily. 90 tablet 3   gabapentin (NEURONTIN) 100 MG capsule Take 100 mg by mouth 3 (three) times daily.     GEMTESA 75 MG TABS TAKE 1 TABLET BY MOUTH ONCE DAILY 30 tablet 12   hydrochlorothiazide (MICROZIDE) 12.5 MG capsule Take 1 capsule (12.5 mg total) by mouth daily. 30 capsule 0   lamoTRIgine (LAMICTAL) 100 MG tablet Take  50-100 mg by mouth daily. Take 50 mg by mouth every morning and 100 mg by mouth at bedtime for two days then take 100 mg by mouth daily thereafter.     oxybutynin (DITROPAN-XL) 10 MG 24 hr tablet TAKE 1 TABLET(10 MG) BY MOUTH DAILY 90 tablet 3   pramipexole (MIRAPEX) 1 MG tablet Take by mouth.     primidone (MYSOLINE) 50 MG tablet Take by mouth at bedtime.     rOPINIRole (REQUIP) 1 MG tablet Take 1 mg by mouth 3 (three) times daily.     ABILIFY MAINTENA 400 MG SRER Inject 400 mg into the skin every 30 (thirty) days. (Patient not taking: Reported on  12/24/2022)     albuterol (PROVENTIL HFA;VENTOLIN HFA) 108 (90 Base) MCG/ACT inhaler Inhale 2 puffs into the lungs every 6 (six) hours as needed for wheezing or shortness of breath. (Patient not taking: Reported on 09/08/2022) 1 Inhaler 0   doxepin (SINEQUAN) 25 MG capsule Take 25 mg by mouth at bedtime. (Patient not taking: Reported on 09/08/2022)     DULoxetine (CYMBALTA) 60 MG capsule Take 2 capsules (120 mg total) by mouth daily. (Patient not taking: Reported on 08/25/2023) 60 capsule 0   ergocalciferol (VITAMIN D2) 1.25 MG (50000 UT) capsule Take 1 capsule by mouth once a week. (Patient not taking: Reported on 08/25/2023)     HYDROcodone-acetaminophen (NORCO/VICODIN) 5-325 MG tablet Take 1 tablet by mouth every 6 (six) hours as needed for moderate pain or severe pain. (Patient not taking: Reported on 09/08/2022) 6 tablet 0   levocetirizine (XYZAL) 5 MG tablet Take 5 mg by mouth every evening. (Patient not taking: Reported on 09/08/2022)     levothyroxine (SYNTHROID, LEVOTHROID) 75 MCG tablet Take 1 tablet (75 mcg total) by mouth daily before breakfast. (Patient not taking: Reported on 12/24/2022) 30 tablet 0   loratadine (CLARITIN) 10 MG tablet Take 1 tablet (10 mg total) by mouth daily. (Patient not taking: Reported on 09/08/2022) 30 tablet 0   Melatonin 3 MG TABS Take 3 mg by mouth at bedtime as needed. (Patient not taking: Reported on 09/08/2022)     metFORMIN (GLUCOPHAGE) 500 MG tablet Take 1 tablet (500 mg total) by mouth daily. (Patient not taking: Reported on 12/24/2022) 30 tablet 0   mirtazapine (REMERON) 15 MG tablet Take 15 mg by mouth at bedtime. (Patient not taking: Reported on 09/08/2022)     mirtazapine (REMERON) 30 MG tablet Take 30 mg by mouth at bedtime. (Patient not taking: Reported on 09/08/2022)     nystatin ointment (MYCOSTATIN) Apply 1 application topically 2 (two) times daily. (Patient not taking: Reported on 09/08/2022) 30 g 0   nystatin-triamcinolone ointment (MYCOLOG) Apply  1 application topically 2 (two) times daily. (Patient not taking: Reported on 09/08/2022) 30 g 0   omega-3 acid ethyl esters (LOVAZA) 1 g capsule Take 1 g by mouth daily. (Patient not taking: Reported on 09/08/2022)     omeprazole (PRILOSEC) 20 MG capsule Take 20 mg by mouth daily. (Patient not taking: Reported on 12/24/2022)     oxybutynin (DITROPAN-XL) 10 MG 24 hr tablet TAKE 1 TABLET(10 MG) BY MOUTH DAILY (Patient not taking: Reported on 08/25/2023) 90 tablet 3   rosuvastatin (CRESTOR) 10 MG tablet Take 10 mg by mouth at bedtime. (Patient not taking: Reported on 09/08/2022)     sulfamethoxazole-trimethoprim (BACTRIM DS) 800-160 MG tablet Take 1 tablet by mouth every 12 (twelve) hours. (Patient not taking: Reported on 09/08/2022) 14 tablet 0   tadalafil (CIALIS) 20  MG tablet Take by mouth. (Patient not taking: Reported on 09/08/2022)     temazepam (RESTORIL) 15 MG capsule Take 15 mg by mouth daily. (Patient not taking: Reported on 09/08/2022)     traZODone (DESYREL) 100 MG tablet Take 100 mg by mouth at bedtime. (Patient not taking: Reported on 09/08/2022)     triamcinolone (KENALOG) 0.025 % cream Apply 1 application topically 2 (two) times daily. (Patient not taking: Reported on 09/08/2022) 30 g 0   No current facility-administered medications for this visit.    OBJECTIVE: Vitals:   08/25/23 1027  BP: 111/79  Pulse: 75  Resp: 16  Temp: 98 F (36.7 C)  SpO2: 97%     Body mass index is 27.93 kg/m.    ECOG FS:0 - Asymptomatic  General: Well-developed, well-nourished, no acute distress. Eyes: Pink conjunctiva, anicteric sclera. HEENT: Normocephalic, moist mucous membranes. Lungs: No audible wheezing or coughing. Heart: Regular rate and rhythm. Abdomen: Soft, nontender, no obvious distention. Musculoskeletal: No edema, cyanosis, or clubbing. Neuro: Alert, answering all questions appropriately. Cranial nerves grossly intact. Skin: No rashes or petechiae noted. Psych: Normal  affect.  LAB RESULTS:  Lab Results  Component Value Date   NA 134 (L) 08/17/2023   K 4.0 08/17/2023   CL 101 08/17/2023   CO2 22 08/17/2023   GLUCOSE 261 (H) 08/17/2023   BUN 14 08/17/2023   CREATININE 0.67 08/17/2023   CALCIUM 9.0 08/17/2023   PROT 7.9 09/12/2020   ALBUMIN 3.9 09/12/2020   AST 32 09/12/2020   ALT 47 (H) 09/12/2020   ALKPHOS 39 09/12/2020   BILITOT 1.6 (H) 09/12/2020   GFRNONAA >60 08/17/2023   GFRAA >60 07/09/2019    Lab Results  Component Value Date   WBC 5.2 08/17/2023   NEUTROABS 3.6 08/17/2023   HGB 16.7 08/17/2023   HCT 47.5 08/17/2023   MCV 87.6 08/17/2023   PLT 298 08/17/2023     STUDIES: No results found.  ASSESSMENT: MGUS  PLAN:    MGUS: Previously, patient's SPEP was negative, but now is positive at 0.5.  His IgM immunoglobulin continues to remain mildly elevated ranging between 377 and 381 since December 2023.  His lambda free light chains also remain mildly elevated ranging between 59.7 and 66.3 over the same timeframe.  He has no evidence of endorgan damage.  No intervention is needed.  Patient does not require bone marrow biopsy.  Return to clinic in 3 months with repeat laboratory work only and then in 6 months for laboratory work and further evaluation.   Cough/congestion: Recommended OTC remedies.  I spent a total of 20 minutes reviewing chart data, face-to-face evaluation with the patient, counseling and coordination of care as detailed above.   Patient expressed understanding and was in agreement with this plan. He also understands that He can call clinic at any time with any questions, concerns, or complaints.    Jeralyn Ruths, MD   08/25/2023 10:51 AM

## 2023-09-06 ENCOUNTER — Telehealth: Payer: Self-pay | Admitting: Urology

## 2023-11-22 ENCOUNTER — Other Ambulatory Visit: Payer: Self-pay

## 2023-11-22 DIAGNOSIS — D472 Monoclonal gammopathy: Secondary | ICD-10-CM

## 2023-11-23 ENCOUNTER — Inpatient Hospital Stay: Payer: 59 | Attending: Oncology

## 2024-01-10 NOTE — Progress Notes (Signed)
 [facility] Engineer, materials (CPhT) Encounter  Primary Care Provider: Rudolpho Norleen Lenis, MD  Provider Suggestion(s):  None   Reason for Encounter:  Mathew Aguilar is a 73 y.o. Male who received a telephone call from a certified pharmacy technician to address a medication access issue   The following screenings were performed: None  Medication history was not obtained with the patient and/or caregiver today.   Medication allergies were reviewed: No  A direct view of blister packs or bottles was conducted:  no Pharmacy(s):  [facility] - [place], [place] - [address] [place] [address] Phone: [phone] Fax: [phone]  [id number] [place], [place] - [address] AT [facility] [address] [place] [address] [place] [address] Phone: [phone] Fax: [phone]  [id number] - [place], [place] - [address] AT [facility] [address] [place] [address] Phone: [phone] Fax: [phone]   Current Outpatient Medications  Medication Sig Dispense Refill  . ARIPiprazole  (ABILIFY ) 5 MG tablet Take 5mg  once daily 30 tablet 3  . atenoloL  (TENORMIN ) 25 MG tablet Take 25 mg by mouth once daily    . DULoxetine  (CYMBALTA ) 60 MG DR capsule TAKE 1 CAPSULE BY MOUTH DAILY 30 capsule 11  . ergocalciferol, vitamin D2, 1,250 mcg (50,000 unit) capsule Take 1 capsule (50,000 Units total) by mouth once a week 12 capsule 1  . finasteride  (PROSCAR ) 5 mg tablet TAKE 1 TABLET BY MOUTH DAILY 90 tablet 1  . hydroCHLOROthiazide  (MICROZIDE ) 12.5 mg capsule Take 12.5 mg by mouth once daily    . lamoTRIgine  (LAMICTAL ) 100 MG tablet Take 100 mg by mouth every 12 (twelve) hours    . levothyroxine  (SYNTHROID ) 75 MCG tablet TAKE 1 TABLET BY MOUTH ON AN EMPTY STOMACH WITH A GLASS OF WATER AT LEAST 30 TO 60 MINUTES BEFORE BREAKFAST 90 tablet 1  . metFORMIN  (GLUCOPHAGE ) 1000 MG tablet TAKE 1  TABLET BY MOUTH 2 TIMES DAILY WITH MEALS (Patient taking differently: Take 500 mg by mouth once daily) 60 tablet 11  . miscellaneous medical supply Misc One handed walker 1 each 0  . omega 3-dha-epa-fish oil (FISH OIL) 300-1,000 mg CpDR TAKE 1 CAPSULE BY MOUTH ONCE A DAY 30 capsule 5  . omega-3-dha-epa-fish oil 300-1,000 mg capsule take 1 capsule by mouth daily 30 capsule 5  . omeprazole (PRILOSEC) 20 MG DR capsule TAKE 1 CAPSULE BY MOUTH DAILY 90 capsule 1  . primidone (MYSOLINE) 50 MG tablet Take 50 mg by mouth every morning    . rosuvastatin  (CRESTOR ) 10 MG tablet TAKE 1 TABLET BY MOUTH ONCE DAILY 30 tablet 5  . tadalafiL (CIALIS) 20 MG tablet Take 1 tablet (20 mg total) by mouth once daily as needed for Erectile Dysfunction for up to 30 days 10 tablet 3  . terBINafine HCL (LAMISIL) 250 mg tablet Take 1 tablet (250 mg total) by mouth once daily 30 tablet 2   No current facility-administered medications for this visit.     Pharmacy Related Issues:     01/10/2024    2:43 PM  Pharmacy Related Issue #1  Question/Concern: Referral - Med Access  Barriers None  Interventions None  Comments:  Patient states he does not have a med access concern of any kind. He gets pill packs delivered and has a payment plan with [employer] [facility] [employer].        Additional Actions Taken:  none   Additional Notes: none  A copy of this note was not forwarded to a [facility] clinical pharmacist for review.   JEFFICA COTTON   IMPORTANT: As Mathew Aguilar's provider, you know this patient best. Please note that these suggestions are not meant to replace your clinical judgment, but to be used in conjunction with your expertise to inform further care.   For more information on [facility] services, click here.

## 2024-02-23 ENCOUNTER — Inpatient Hospital Stay: Payer: 59 | Attending: Oncology

## 2024-03-01 ENCOUNTER — Other Ambulatory Visit

## 2024-03-01 ENCOUNTER — Inpatient Hospital Stay: Payer: 59 | Admitting: Oncology

## 2024-03-08 ENCOUNTER — Inpatient Hospital Stay: Admitting: Oncology

## 2024-03-28 ENCOUNTER — Emergency Department (EMERGENCY_DEPARTMENT_HOSPITAL)
Admission: EM | Admit: 2024-03-28 | Discharge: 2024-03-29 | Disposition: A | Discharge status: Home / Self Care | Source: Home / Self Care | Attending: Emergency Medicine | Admitting: Emergency Medicine

## 2024-03-28 ENCOUNTER — Other Ambulatory Visit: Payer: Self-pay

## 2024-03-28 DIAGNOSIS — F333 Major depressive disorder, recurrent, severe with psychotic symptoms: Secondary | ICD-10-CM | POA: Diagnosis not present

## 2024-03-28 DIAGNOSIS — Z79899 Other long term (current) drug therapy: Secondary | ICD-10-CM | POA: Insufficient documentation

## 2024-03-28 DIAGNOSIS — E039 Hypothyroidism, unspecified: Secondary | ICD-10-CM | POA: Insufficient documentation

## 2024-03-28 DIAGNOSIS — F329 Major depressive disorder, single episode, unspecified: Secondary | ICD-10-CM | POA: Diagnosis not present

## 2024-03-28 DIAGNOSIS — F332 Major depressive disorder, recurrent severe without psychotic features: Secondary | ICD-10-CM | POA: Insufficient documentation

## 2024-03-28 DIAGNOSIS — R45851 Suicidal ideations: Secondary | ICD-10-CM | POA: Diagnosis not present

## 2024-03-28 LAB — COMPREHENSIVE METABOLIC PANEL WITH GFR
ALT: 29 U/L (ref 0–44)
AST: 20 U/L (ref 15–41)
Albumin: 3.8 g/dL (ref 3.5–5.0)
Alkaline Phosphatase: 48 U/L (ref 38–126)
Anion gap: 12 (ref 5–15)
BUN: 14 mg/dL (ref 8–23)
CO2: 20 mmol/L — ABNORMAL LOW (ref 22–32)
Calcium: 9.3 mg/dL (ref 8.9–10.3)
Chloride: 105 mmol/L (ref 98–111)
Creatinine, Ser: 0.39 mg/dL — ABNORMAL LOW (ref 0.61–1.24)
GFR, Estimated: >60 mL/min (ref >60)
Glucose, Bld: 184 mg/dL — ABNORMAL HIGH (ref 70–99)
Potassium: 4 mmol/L (ref 3.5–5.1)
Sodium: 137 mmol/L (ref 135–145)
Total Bilirubin: 0.9 mg/dL (ref 0.0–1.2)
Total Protein: 7.4 g/dL (ref 6.5–8.1)

## 2024-03-28 LAB — URINE DRUG SCREEN, QUALITATIVE (ARMC ONLY)
Amphetamines, Ur Screen: NOT DETECTED
Barbiturates, Ur Screen: NOT DETECTED
Benzodiazepine, Ur Scrn: NOT DETECTED
Cannabinoid 50 Ng, Ur ~~LOC~~: NOT DETECTED
Cocaine Metabolite,Ur ~~LOC~~: NOT DETECTED
MDMA (Ecstasy)Ur Screen: NOT DETECTED
Methadone Scn, Ur: NOT DETECTED
Opiate, Ur Screen: NOT DETECTED
Phencyclidine (PCP) Ur S: NOT DETECTED
Tricyclic, Ur Screen: NOT DETECTED

## 2024-03-28 LAB — ETHANOL: Alcohol, Ethyl (B): <15 mg/dL (ref <15)

## 2024-03-28 LAB — CBC
HCT: 45.9 % (ref 39.0–52.0)
Hemoglobin: 15.8 g/dL (ref 13.0–17.0)
MCH: 30.4 pg (ref 26.0–34.0)
MCHC: 34.4 g/dL (ref 30.0–36.0)
MCV: 88.4 fL (ref 80.0–100.0)
Platelets: 249 K/uL (ref 150–400)
RBC: 5.19 MIL/uL (ref 4.22–5.81)
RDW: 12.6 % (ref 11.5–15.5)
WBC: 5.7 K/uL (ref 4.0–10.5)
nRBC: 0 % (ref 0.0–0.2)

## 2024-03-28 MED ORDER — MELATONIN 5 MG PO TABS
5.0000 mg | ORAL_TABLET | Freq: Every evening | ORAL | Status: DC | PRN
  Filled 2024-03-28: qty 1

## 2024-03-28 NOTE — ED Triage Notes (Signed)
 Pt comes with c/o vol SI and depression for about 5-6 days. Pt states he was going to take bunch of medicine. Pt has hx of bipolar.
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
## 2024-03-28 NOTE — ED Notes (Signed)
 Pt dressed out by this tech. 1 red short 1 blue shirt 2 sets of keys  1 black wallet  1 brown hat 1 black cell phone Several packets of prepackaged meds.

## 2024-03-28 NOTE — ED Notes (Signed)
 Pt has ACT team member, Zoey Gillispie [phone]. Supervisor Romero Ruth [phone]. Call ahead of time to discuss discharge.

## 2024-03-28 NOTE — BH Assessment (Signed)
 Comprehensive Clinical Assessment (CCA) Screening, Triage and Referral Note  03/28/2024 Mathew Aguilar [id number] Mathew Aguilar, a 73 year old Caucasian male, presented voluntarily to the [facility] Emergency Department and was subsequently placed under an involuntary commitment (IVC) by EDP Dr. Jossie. Per triage documentation, the patient reported experiencing suicidal ideation (SI) and symptoms of depression for the past 5-6 days. He disclosed intentions to ingest a large quantity of medication. The patient has a known history of bipolar disorder. He is currently connected with the [facility] team nurse but is not engaged in ongoing psychiatric or therapeutic care. He reports medication compliance but expresses concerns about the efficacy and side effects of his current regimen. Mathew Aguilar described a significant decline in mood, marked by hopelessness, loneliness, anhedonia, and disrupted sleep and appetite. He attributes much of his emotional distress to social isolation and a lack of community connection in his current living environment. He reported spending most of his time sitting or lying in bed. He also shared that estrangement from his ex-partner and son--whom he has not seen in 7-8 years--has been a major source of emotional pain. He stated that he awoke this morning feeling indifferent about whether he lived or died. Mental Status Examination: Appearance: Appropriately dressed, appeared his stated age Speech: Slurred but relevant Mood/Affect: Depressed mood with congruent affect Thought Process: Logical and goal-directed Thought Content: Reports intermittent, passive SI without current intent or plan; occasional homicidal ideation (HI) when triggered, but no current target or plan; denies visual or auditory hallucinations (V/H), though reports hearing indistinct sounds at times Insight/Judgment: Limited insight into the impact of isolation and medication side effects; judgment  appears impaired by depressive symptoms Cognition: Reports memory difficulties, low energy, and poor concentration Risk Assessment: Suicidal Ideation: Intermittent, passive SI reported; no current plan or intent Homicidal Ideation: Occasional thoughts when provoked; no current plan or target Psychosis: Denies hallucinations; vague auditory experiences reported Substance Use: Denied Protective Factors: Connection to ACT team nurse; willingness to seek help; medication compliance despite concerns  Chief Complaint:  Chief Complaint  Patient presents with   SI   Visit Diagnosis: MDD recurrent, severe  Patient Reported Information How did you hear about us ? Self  What Is the Reason for Your Visit/Call Today? Pt comes with c/o vol SI and depression for about 5-6 days. Pt states he was going to take bunch of medicine. Pt has hx of bipolar.  How Long Has This Been Causing You Problems? 1-6 months  What Do You Feel Would Help You the Most Today? Medication(s); Treatment for Depression or other mood problem   Have You Recently Had Any Thoughts About Hurting Yourself? Yes  Are You Planning to Commit Suicide/Harm Yourself At This time? No   Have you Recently Had Thoughts About Hurting Someone Sherral? Yes  Are You Planning to Harm Someone at This Time? No  Explanation: Pt reported having  thoughts of worsening SI and passive, intermittent HI.   Have You Used Any Alcohol or Drugs in the Past 24 Hours? No  How Long Ago Did You Use Drugs or Alcohol? No data recorded What Did You Use and How Much? No data recorded  Do You Currently Have a Therapist/Psychiatrist? Yes  Name of Therapist/Psychiatrist: [facility]   Have You Been Recently Discharged From Any Office Practice or Programs? No  Explanation of Discharge From Practice/Program: No data recorded   CCA Screening Triage Referral Assessment Type of Contact: Face-to-Face  Telemedicine Service Delivery:   Is this  Initial or Reassessment?   Date Telepsych consult ordered in [facility]:    Time Telepsych consult ordered in [facility]:    Location of Assessment: [facility] ED  Provider Location: [facility] ED    Collateral Involvement: n/a   Does Patient Have a Court Appointed Legal Guardian? No data recorded Name and Contact of Legal Guardian: No data recorded If Minor and Not Living with Parent(s), Who has Custody? n/a  Is CPS involved or ever been involved? Never  Is APS involved or ever been involved? Never   Patient Determined To Be At Risk for Harm To Self or Others Based on Review of Patient Reported Information or Presenting Complaint? Yes, for Self-Harm  Method: Plan without intent  Availability of Means: Has close by  Intent: Vague intent or NA  Notification Required: No need or identified person  Additional Information for Danger to Others Potential: -- (n/a)  Additional Comments for Danger to Others Potential: n/a  Are There Guns or Other Weapons in Your Home? No  Types of Guns/Weapons: n/a  Are These Weapons Safely Secured?                            -- (n/a)  Who Could Verify You Are Able To Have These Secured: n/a  Do You Have any Outstanding Charges, Pending Court Dates, Parole/Probation? None reported  Contacted To Inform of Risk of Harm To Self or Others: -- (n/a)   Does Patient Present under Involuntary Commitment? Yes    [place] of Residence: [place]   Patient Currently Receiving the Following Services: ACTT Engineer, agricultural Treatment); Medication Management   Determination of Need: Emergent (2 hours)   Options For Referral: ED Visit   Disposition Recommendation per psychiatric provider: Inpatient hospitialization  Helayna Dun R Oluwadarasimi Favor, LCAS

## 2024-03-28 NOTE — ED Notes (Signed)
 [facility] Coordinator number called twice without answer- no one is available to take your call.

## 2024-03-28 NOTE — ED Notes (Signed)
 Night snack has been giving. With a ginger ale.

## 2024-03-28 NOTE — Consult Note (Signed)
 [facility] Consult Note  Patient Name: Mathew Aguilar MRN: [id number] DOB: 07-05-1951 DATE OF Consult: 03/28/2024  PRIMARY PSYCHIATRIC DIAGNOSES  - Major depressive disorder - Suicidal ideation   RECOMMENDATIONS  Admit to inpatient psych for safety and Stabilization.   Medication recommendations: Please reconcile patient's home medications and continue them as prescribed. Give Melatonin 3mg  PO at bedtime PRN for sleep.  Non-Medication/therapeutic recommendations: Maintain safety precautions. Ongoing suicide risk assessment and monitoring.  Refer to outpatient psychiatric provider for medication management and therapy upon discharge from inpatient psych admission.    Communication: Treatment team members (and family members if applicable) who were involved in treatment/care discussions and planning, and with whom we spoke or engaged with via secure text/chat, include the following: patient's treatment team.  Thank you for involving us  in the care of this patient. If you have any additional questions or concerns, please call [phone] and ask for me or the provider on-call.  TELEPSYCHIATRY ATTESTATION & CONSENT  As the provider for this telehealth consult, I attest that I verified the patient's identity using two separate identifiers, introduced myself to the patient, provided my credentials, disclosed my location, and performed this encounter via a HIPAA-compliant, real-time, face-to-face, two-way, interactive audio and video platform and with the full consent and agreement of the patient (or guardian as applicable.)  Patient physical location: [place]. Telehealth provider physical location: home office in state of [place].  Video start time: [address] [facility] Time) Video end time: [address] [facility] Time)  IDENTIFYING DATA  Mathew Aguilar is a 73 y.o. year-old male for whom a psychiatric consultation has been ordered by the primary provider. The patient was identified using two  separate identifiers.  CHIEF COMPLAINT/REASON FOR CONSULT  - I just got real depressed. - Pt. presented with worsening depressive symptoms, including low mood, poor sleep, and decreased appetite, seeking help due to feeling that current medications are no longer effective.  HISTORY OF PRESENT ILLNESS (HPI)  A 73 year old man presented with a chief complaint of worsening depression over the past four to five months. He described a significant decline in mood, with symptoms including persistent sadness, feelings of hopelessness, and worthlessness. He indicated that his current medication regimen, which he has been on for a prolonged period, appears to have lost efficacy during this time. Specific details regarding his medications were not available, though he affirmed adherence to his prescribed regimen.  Sleep disturbance was reported, characterized by frequent awakenings and an inability to maintain sleep throughout the night. Appetite has also been diminished, mostly over the past two to three days. He denied current substance use, including tobacco, alcohol, and illicit drugs, and reported no family history of mental illness or suicide.  Passive suicidal ideation was acknowledged, with the patient stating that he had thoughts of self-harm in the past and had previously been hospitalized for depression approximately five to six years ago. Per ER staff note, patient reported having suicidal ideations with plan to overdose on medications. At the time of evaluation, he denied active suicidal intent but expressed uncertainty about his ability to remain safe if discharged home. He lives alone and has limited social support, primarily maintaining contact with a sister by phone and receiving periodic visits from the ACT team. He reports that he has not yet established care with an outpatient psychiatrist or therapist, though he is connected with a nurse through the ACT team who visits regularly.  Mild  auditory perceptual disturbances were endorsed, though specifics were vague and not clearly indicative  of frank psychosis. Paranoid ideation was present, as he expressed beliefs that others may be trying to harm him. No visual hallucinations were reported. The overall clinical picture is notable for chronic, treatment-resistant depressive symptoms with superimposed psychotic features and significant psychosocial stressors, including social isolation and limited support.  PAST PSYCHIATRIC HISTORY  - History of depression, anxiety, borderline personality disorder with prior suicide attempt. Previous psychiatric admissions, most recently five to six years ago, for severe depression. - Unable to recall specific past psychiatric medications, but reported long-term use of current regimen with recent loss of efficacy. - Chart review shows he has been prescribed Duloxetine , Abilify  Otherwise as per HPI above.  PAST MEDICAL HISTORY  Past Medical History:  Diagnosis Date   Anxiety    Asthma    Borderline personality disorder (HCC)    Depression    Dysrhythmia    Erectile dysfunction    Fatty liver    GERD (gastroesophageal reflux disease)    Hypertension    Hypothyroidism    Pulmonary nodule, right    Restless leg    Sleep apnea    Suicide attempt [facility])      HOME MEDICATIONS  PTA Medications  Medication Sig   DULoxetine  (CYMBALTA ) 60 MG capsule Take 2 capsules (120 mg total) by mouth daily. (Patient taking differently: Take 60 mg by mouth daily.)   levothyroxine  (SYNTHROID , LEVOTHROID) 75 MCG tablet Take 1 tablet (75 mcg total) by mouth daily before breakfast.   omeprazole (PRILOSEC) 20 MG capsule Take 20 mg by mouth daily.   omega-3 acid ethyl esters (LOVAZA) 1 g capsule Take 1 g by mouth daily.   finasteride  (PROSCAR ) 5 MG tablet Take 1 tablet (5 mg total) by mouth daily.   rosuvastatin  (CRESTOR ) 10 MG tablet Take 10 mg by mouth at bedtime.   ARIPiprazole  (ABILIFY ) 2 MG tablet Take 2 mg by  mouth daily.   metFORMIN  (GLUCOPHAGE ) 1000 MG tablet Take 1,000 mg by mouth 2 (two) times daily.     ALLERGIES  No Known Allergies  SOCIAL & SUBSTANCE USE HISTORY  Social History   Socioeconomic History   Marital status: Divorced    Spouse name: Not on file   Number of children: Not on file   Years of education: Not on file   Highest education level: Not on file  Occupational History   Not on file  Tobacco Use   Smoking status: Never   Smokeless tobacco: Current    Types: Chew  Vaping Use   Vaping status: Never Used  Substance and Sexual Activity   Alcohol use: Yes    Comment: history of ETOH abuse - sober several months   Drug use: No   Sexual activity: Not Currently    Birth control/protection: None  Other Topics Concern   Not on file  Social History Narrative   Not on file   Social Drivers of Health   Financial Resource Strain: Low Risk  (08/02/2023)   Received from [facility]   Overall Financial Resource Strain (CARDIA)    Difficulty of Paying Living Expenses: Not hard at all  Food Insecurity: No Food Insecurity (08/02/2023)   Received from [facility]   Hunger Vital Sign    Within the past 12 months, you worried that your food would run out before you got the money to buy more.: Never true    Within the past 12 months, the food you bought just didn't last and you didn't have money to  get more.: Never true  Transportation Needs: No Transportation Needs (08/02/2023)   Received from [facility] - Transportation    In the past 12 months, has lack of transportation kept you from medical appointments or from getting medications?: No    Lack of Transportation (Non-Medical): No  Physical Activity: Not on file  Stress: Not on file  Social Connections: Not on file   Social History   Tobacco Use  Smoking Status Never  Smokeless Tobacco Current   Types: Chew   Social History   Substance and Sexual  Activity  Alcohol Use Yes   Comment: history of ETOH abuse - sober several months   Social History   Substance and Sexual Activity  Drug Use No    Additional pertinent information .  FAMILY HISTORY  Family History  Family history unknown: Yes   Family Psychiatric History (if known):  denies  MENTAL STATUS EXAM (MSE)  Mental Status Exam: General Appearance:Well groomed and cooperative.  Orientation:   Alert and oriented.  Memory:  fair  Concentration:  fair  Recall:  Fair  Attention  Fair  Eye Contact:  Good  Speech:  Normal Rate  Language:  Good  Volume:  Normal  Mood:  endorsed feeling sad, hopeless and worthless.  Affect:  appears congruent with the content discussed, with a general sentiment of sadness and subdued mood  Thought Process:  Goal Directed  Thought Content:  He endorsed feelings of sadness, hopelessness, and worthlessness. He reported hearing a little bit of voices but denied visual hallucinations. He expressed some paranoia, stating he thinks people are trying to harm him. There is no mention of obsessive thoughts, delusional thoughts, or intrusive thoughts.  Suicidal Thoughts:  He endorsed recent suicidal ideation and a history of prior attempts, but denied current intent or plan.  Homicidal Thoughts:  No  Judgement:  Fair  Insight:  Fair  Psychomotor Activity:  Normal  Akathisia:  Negative  Fund of Knowledge:  Good    Assets:  Communication Skills Desire for Improvement Social Support  Cognition:  WNL  ADL's:  Intact  AIMS (if indicated):       VITALS  Blood pressure 133/69, pulse 71, temperature 98.6 F (37 C), temperature source Oral, resp. rate 18, height 5' (1.524 m), weight 65.3 kg, SpO2 96%.  LABS  Admission on 03/28/2024  Component Date Value Ref Range Status   Sodium 03/28/2024 137  135 - 145 mmol/L Final   Potassium 03/28/2024 4.0  3.5 - 5.1 mmol/L Final   Chloride 03/28/2024 105  98 - 111 mmol/L Final   CO2 03/28/2024 20 (L)  22 -  32 mmol/L Final   Glucose, Bld 03/28/2024 184 (H)  70 - 99 mg/dL Final   Glucose reference range applies only to samples taken after fasting for at least 8 hours.   BUN 03/28/2024 14  8 - 23 mg/dL Final   Creatinine, Ser 03/28/2024 0.39 (L)  0.61 - 1.24 mg/dL Final   Calcium  03/28/2024 9.3  8.9 - 10.3 mg/dL Final   Total Protein 92/84/7974 7.4  6.5 - 8.1 g/dL Final   Albumin 92/84/7974 3.8  3.5 - 5.0 g/dL Final   AST 92/84/7974 20  15 - 41 U/L Final   ALT 03/28/2024 29  0 - 44 U/L Final   Alkaline Phosphatase 03/28/2024 48  38 - 126 U/L Final   Total Bilirubin 03/28/2024 0.9  0.0 - 1.2 mg/dL Final   GFR, Estimated 03/28/2024 >60  >  60 mL/min Final   Comment: (NOTE) Calculated using the CKD-EPI Creatinine Equation (2021)    Anion gap 03/28/2024 12  5 - 15 Final   Performed at [facility], [address] [place], [place] [address]   Alcohol, Ethyl (B) 03/28/2024 <15  <15 mg/dL Final   Comment: (NOTE) For medical purposes only. Performed at [facility], [address] [place], [place] [address]    WBC 03/28/2024 5.7  4.0 - 10.5 K/uL Final   RBC 03/28/2024 5.19  4.22 - 5.81 MIL/uL Final   Hemoglobin 03/28/2024 15.8  13.0 - 17.0 g/dL Final   HCT 92/84/7974 45.9  39.0 - 52.0 % Final   MCV 03/28/2024 88.4  80.0 - 100.0 fL Final   MCH 03/28/2024 30.4  26.0 - 34.0 pg Final   MCHC 03/28/2024 34.4  30.0 - 36.0 g/dL Final   RDW 92/84/7974 12.6  11.5 - 15.5 % Final   Platelets 03/28/2024 249  150 - 400 K/uL Final   nRBC 03/28/2024 0.0  0.0 - 0.2 % Final   Performed at [facility], [address] [place], [place] [address]   Tricyclic, Ur Screen 03/28/2024 NONE DETECTED  NONE DETECTED Final   Amphetamines, Ur Screen 03/28/2024 NONE DETECTED  NONE DETECTED Final   MDMA (Ecstasy)Ur Screen 03/28/2024 NONE DETECTED  NONE DETECTED Final   Cocaine Metabolite,Ur [place] 03/28/2024 NONE DETECTED  NONE DETECTED Final   Opiate, Ur Screen 03/28/2024 NONE DETECTED  NONE  DETECTED Final   Phencyclidine (PCP) Ur S 03/28/2024 NONE DETECTED  NONE DETECTED Final   Cannabinoid 50 Ng, Ur [place] 03/28/2024 NONE DETECTED  NONE DETECTED Final   Barbiturates, Ur Screen 03/28/2024 NONE DETECTED  NONE DETECTED Final   Benzodiazepine, Ur Scrn 03/28/2024 NONE DETECTED  NONE DETECTED Final   Methadone Scn, Ur 03/28/2024 NONE DETECTED  NONE DETECTED Final   Comment: (NOTE) Tricyclics + metabolites, urine    Cutoff 1000 ng/mL Amphetamines + metabolites, urine  Cutoff 1000 ng/mL MDMA (Ecstasy), urine              Cutoff 500 ng/mL Cocaine Metabolite, urine          Cutoff 300 ng/mL Opiate + metabolites, urine        Cutoff 300 ng/mL Phencyclidine (PCP), urine         Cutoff 25 ng/mL Cannabinoid, urine                 Cutoff 50 ng/mL Barbiturates + metabolites, urine  Cutoff 200 ng/mL Benzodiazepine, urine              Cutoff 200 ng/mL Methadone, urine                   Cutoff 300 ng/mL  The urine drug screen provides only a preliminary, unconfirmed analytical test result and should not be used for non-medical purposes. Clinical consideration and professional judgment should be applied to any positive drug screen result due to possible interfering substances. A more specific alternate chemical method must be used in order to obtain a confirmed analytical result. Gas chromatography / mass spectrometry (GC/MS) is the preferred confirm                          atory method. Performed at [facility], [address] [place], [place] [address]     PSYCHIATRIC REVIEW OF SYSTEMS (ROS)  ROS: Notable for the following relevant positive findings: ROS  Additional findings:      Musculoskeletal: No abnormal movements observed      Gait & Station: Laying/Sitting      Pain Screening: Denies      Nutrition & Dental Concerns: Decrease in food intake and/or loss of appetite  RISK FORMULATION/ASSESSMENT  Is the patient experiencing any suicidal or homicidal ideations: Yes        Explain if yes: He endorsed recent suicidal ideation and a history of prior attempts, but denied current intent or plan. Protective factors considered for safety management:  include daily contact with his sister, willingness to engage with the ACT team, and openness to hospitalization for safety.   Risk factors/concerns considered for safety management:  include male gender, living alone, chronic mental health issues with recurrent depression, history of prior psychiatric hospitalizations, and a history of prior suicide attempts. He reports poor sleep, decreased appetite, and feelings of hopelessness and worthlessness.  Is there a safety management plan with the patient and treatment team to minimize risk factors and promote protective factors: Yes           Explain: admit to psych Is crisis care placement or psychiatric hospitalization recommended: Yes     Based on my current evaluation and risk assessment, patient is determined at this time to be at:  Moderate Risk  *RISK ASSESSMENT Risk assessment is a dynamic process; it is possible that this patient's condition, and risk level, may change. This should be re-evaluated and managed over time as appropriate. Please re-consult psychiatric consult services if additional assistance is needed in terms of risk assessment and management. If your team decides to discharge this patient, please advise the patient how to best access emergency psychiatric services, or to call 911, if their condition worsens or they feel unsafe in any way.   Ines Hock, NP Telepsychiatry Consult Services

## 2024-03-28 NOTE — BH Assessment (Signed)
 [facility] consult has been requested.

## 2024-03-28 NOTE — ED Notes (Signed)
 Pt has been given Psychologist, forensic. He Refused' it after looking at it stated to take it out of his room

## 2024-03-29 ENCOUNTER — Encounter: Payer: Self-pay | Admitting: Psychiatry

## 2024-03-29 ENCOUNTER — Inpatient Hospital Stay
Admission: AD | Admit: 2024-03-29 | Discharge: 2024-04-03 | DRG: 881 | Disposition: A | Discharge status: Home / Self Care | Source: Intra-hospital | Attending: Psychiatry | Admitting: Psychiatry

## 2024-03-29 DIAGNOSIS — Z9151 Personal history of suicidal behavior: Secondary | ICD-10-CM | POA: Diagnosis not present

## 2024-03-29 DIAGNOSIS — K760 Fatty (change of) liver, not elsewhere classified: Secondary | ICD-10-CM | POA: Diagnosis present

## 2024-03-29 DIAGNOSIS — Z7984 Long term (current) use of oral hypoglycemic drugs: Secondary | ICD-10-CM

## 2024-03-29 DIAGNOSIS — Z608 Other problems related to social environment: Secondary | ICD-10-CM | POA: Diagnosis present

## 2024-03-29 DIAGNOSIS — K219 Gastro-esophageal reflux disease without esophagitis: Secondary | ICD-10-CM | POA: Diagnosis present

## 2024-03-29 DIAGNOSIS — F329 Major depressive disorder, single episode, unspecified: Principal | ICD-10-CM | POA: Diagnosis present

## 2024-03-29 DIAGNOSIS — Z79890 Hormone replacement therapy (postmenopausal): Secondary | ICD-10-CM

## 2024-03-29 DIAGNOSIS — E039 Hypothyroidism, unspecified: Secondary | ICD-10-CM | POA: Diagnosis present

## 2024-03-29 DIAGNOSIS — I10 Essential (primary) hypertension: Secondary | ICD-10-CM | POA: Diagnosis present

## 2024-03-29 DIAGNOSIS — E119 Type 2 diabetes mellitus without complications: Secondary | ICD-10-CM | POA: Diagnosis present

## 2024-03-29 DIAGNOSIS — F603 Borderline personality disorder: Secondary | ICD-10-CM | POA: Diagnosis present

## 2024-03-29 DIAGNOSIS — G2581 Restless legs syndrome: Secondary | ICD-10-CM | POA: Diagnosis present

## 2024-03-29 DIAGNOSIS — Z1152 Encounter for screening for COVID-19: Secondary | ICD-10-CM | POA: Diagnosis not present

## 2024-03-29 DIAGNOSIS — F332 Major depressive disorder, recurrent severe without psychotic features: Secondary | ICD-10-CM | POA: Diagnosis not present

## 2024-03-29 DIAGNOSIS — Z604 Social exclusion and rejection: Secondary | ICD-10-CM | POA: Diagnosis present

## 2024-03-29 DIAGNOSIS — Z638 Other specified problems related to primary support group: Secondary | ICD-10-CM | POA: Diagnosis not present

## 2024-03-29 DIAGNOSIS — R45851 Suicidal ideations: Secondary | ICD-10-CM | POA: Diagnosis present

## 2024-03-29 DIAGNOSIS — Z79899 Other long term (current) drug therapy: Secondary | ICD-10-CM | POA: Diagnosis not present

## 2024-03-29 LAB — MAGNESIUM: Magnesium: 2 mg/dL (ref 1.7–2.4)

## 2024-03-29 LAB — SARS CORONAVIRUS 2 BY RT PCR: SARS Coronavirus 2 by RT PCR: NEGATIVE

## 2024-03-29 MED ORDER — ALUM & MAG HYDROXIDE-SIMETH 200-200-20 MG/5ML PO SUSP
30.0000 mL | ORAL | Status: DC | PRN
Start: 2024-03-29 — End: 2024-03-29

## 2024-03-29 MED ORDER — ACETAMINOPHEN 325 MG PO TABS
650.0000 mg | ORAL_TABLET | Freq: Four times a day (QID) | ORAL | Status: DC | PRN
Start: 2024-03-29 — End: 2024-03-29

## 2024-03-29 MED ORDER — OLANZAPINE 5 MG PO TBDP: 5.0000 mg | ORAL_TABLET | Freq: Three times a day (TID) | ORAL | Status: DC | PRN

## 2024-03-29 MED ORDER — MAGNESIUM HYDROXIDE 400 MG/5ML PO SUSP: 30.0000 mL | Freq: Every day | ORAL | Status: DC | PRN

## 2024-03-29 MED ORDER — OLANZAPINE 10 MG IM SOLR: 5.0000 mg | Freq: Three times a day (TID) | INTRAMUSCULAR | Status: DC | PRN

## 2024-03-29 MED ORDER — ACETAMINOPHEN 325 MG PO TABS: 650.0000 mg | ORAL_TABLET | Freq: Four times a day (QID) | ORAL | Status: DC | PRN

## 2024-03-29 MED ORDER — ALUM & MAG HYDROXIDE-SIMETH 200-200-20 MG/5ML PO SUSP: 30.0000 mL | ORAL | Status: DC | PRN

## 2024-03-29 MED ORDER — HYDROXYZINE HCL 25 MG PO TABS: 25.0000 mg | ORAL_TABLET | Freq: Three times a day (TID) | ORAL | Status: DC | PRN

## 2024-03-29 NOTE — Group Note (Signed)
 Date:  03/29/2024 Time:  4:16 PM  Group Topic/Focus:  Self Esteem Action Plan:   The focus of this group is to help patients create a plan to continue to build self-esteem after discharge.    Participation Level:  Active  Participation Quality:  Appropriate  Affect:  Appropriate  Cognitive:  Appropriate  Insight: Appropriate  Engagement in Group:  Engaged  Modes of Intervention:  Activity   Arland Nutting 03/29/2024, 4:16 PM

## 2024-03-29 NOTE — ED Notes (Signed)
 Pt ambulated to and from bathroom, no assistance required.

## 2024-03-29 NOTE — Group Note (Signed)
 Date:  03/29/2024 Time:  10:51 PM  Group Topic/Focus:  Wrap-Up Group:   The focus of this group is to help patients review their daily goal of treatment and discuss progress on daily workbooks.    Participation Level:  Active  Participation Quality:  Appropriate  Affect:  Appropriate  Cognitive:  Alert  Insight: Appropriate  Engagement in Group:  Engaged  Modes of Intervention:  Discussion  Additional Comments:    Mathew Aguilar Bunker 03/29/2024, 10:51 PM

## 2024-03-29 NOTE — ED Notes (Signed)
 Patient pleasant and cooperative upon approach. Initially requested medication for sleep but declined after writer explained that it was melatonin. States he remembers taking a medication and later not being able to walk and he believes it was melatonin.

## 2024-03-29 NOTE — Progress Notes (Signed)
   03/29/24 1549  Psych Admission Type (Psych Patients Only)  Admission Status Involuntary  Psychosocial Assessment  Patient Complaints Anxiety;Depression  Eye Contact Brief  Facial Expression Animated  Affect Sad  Speech Logical/coherent  Interaction Assertive  Motor Activity Slow  Appearance/Hygiene In scrubs  Mood Depressed;Anxious;Sad  Thought Process  Coherency WDL  Content Blaming others  Delusions None reported or observed  Perception WDL  Hallucination None reported or observed  Judgment Impaired  Confusion None  Danger to Self  Current suicidal ideation? Denies  Danger to Others  Danger to Others None reported or observed   Mr. Ripp was brought to the emergency room by his ACT team nurse due to concerns about suicidality. The patient reports that it was a misunderstanding; the nurse misunderstood him.  He denied current suicidal ideation, stating, "She thought I was. she was afraid I might hurt myself," but admits to having had concerning thoughts that prompted the nurse's concern. He denies HI and AVH.

## 2024-03-29 NOTE — Group Note (Signed)
 Physical/Occupational Therapy Group Note  Group Topic: Yoga  Group Date: 03/29/2024 Start Time: 1308 End Time: 1338 Facilitators: Willamae Demby, Alm Hamilton, PT   Group Description: Group participated with series of yoga poses, designed to emphasize functional sitting balance, core stability, generalized flexibility and overall posture.  Incorporated deep breathing techniques with poses, working to promote relaxation, mindfulness and focus with targeted activities.   Discussed benefits of yoga in improving mood and self-esteem, reducing stress and anxiety, and promoting functional strength and balance for each participant.  Discussed ways to integrate into each participant's daily routine.  Provided handout with written and pictorial descriptions of included yoga movements to be utilized as appropriate outside of group time.  Therapeutic Goal(s):  Demonstrate safe ability to participate with yoga poses during group activity. Identify one benefit of participation with yoga poses as part of each participant's exercise/movement routine. Identify 1-2 individual poses that participant feels most beneficial to his/her needs and that he/she can easily replicate outside of group.  Individual Participation: Did not attend  Participation Level:   Participation Quality:   Behavior:   Speech/Thought Process:   Affect/Mood:   Insight:   Judgement:   Modes of Intervention:   Patient Response to Interventions:    Plan: Continue to engage patient in PT/OT groups 1 - 2x/week.  CHARM Hamilton Bertin PT, DPT 03/29/24, 5:00 PM

## 2024-03-29 NOTE — Plan of Care (Signed)
  Problem: Self-Concept: Goal: Ability to identify factors that promote anxiety will improve Outcome: Progressing Goal: Level of anxiety will decrease Outcome: Progressing Goal: Ability to modify response to factors that promote anxiety will improve Outcome: Progressing   Problem: Coping: Goal: Ability to identify and develop effective coping behavior will improve Outcome: Progressing

## 2024-03-29 NOTE — ED Notes (Signed)
 IVC /recommend inpatient psych for safety and stabilization

## 2024-03-29 NOTE — ED Notes (Signed)
Pt given breakfast tray and beverage.  

## 2024-03-30 DIAGNOSIS — F329 Major depressive disorder, single episode, unspecified: Secondary | ICD-10-CM

## 2024-03-30 MED ORDER — METFORMIN HCL 500 MG PO TABS
1000.0000 mg | ORAL_TABLET | Freq: Two times a day (BID) | ORAL | Status: DC
  Administered 2024-03-30 – 2024-04-03 (×8): 1000 mg via ORAL
  Filled 2024-03-30 (×9): qty 2

## 2024-03-30 MED ORDER — DULOXETINE HCL 60 MG PO CPEP
60.0000 mg | ORAL_CAPSULE | Freq: Every day | ORAL | Status: DC
  Administered 2024-03-30 – 2024-03-31 (×2): 60 mg via ORAL
  Filled 2024-03-30 (×2): qty 1

## 2024-03-30 MED ORDER — PANTOPRAZOLE SODIUM 40 MG PO TBEC
40.0000 mg | DELAYED_RELEASE_TABLET | Freq: Every day | ORAL | Status: DC
  Administered 2024-03-30 – 2024-04-03 (×5): 40 mg via ORAL
  Filled 2024-03-30 (×5): qty 1

## 2024-03-30 MED ORDER — LEVOTHYROXINE SODIUM 75 MCG PO TABS
75.0000 ug | ORAL_TABLET | Freq: Every day | ORAL | Status: DC
  Administered 2024-03-31 – 2024-04-03 (×4): 75 ug via ORAL
  Filled 2024-03-30 (×4): qty 1

## 2024-03-30 MED ORDER — FINASTERIDE 5 MG PO TABS
5.0000 mg | ORAL_TABLET | Freq: Every day | ORAL | Status: DC
  Administered 2024-03-30 – 2024-04-03 (×5): 5 mg via ORAL
  Filled 2024-03-30 (×5): qty 1

## 2024-03-30 MED ORDER — ARIPIPRAZOLE 2 MG PO TABS
2.0000 mg | ORAL_TABLET | Freq: Every day | ORAL | Status: DC
  Administered 2024-03-30 – 2024-03-31 (×2): 2 mg via ORAL
  Filled 2024-03-30 (×2): qty 1

## 2024-03-30 MED ORDER — ROSUVASTATIN CALCIUM 10 MG PO TABS
10.0000 mg | ORAL_TABLET | Freq: Every day | ORAL | Status: DC
  Administered 2024-03-30 – 2024-04-02 (×4): 10 mg via ORAL
  Filled 2024-03-30 (×4): qty 1

## 2024-03-30 NOTE — Plan of Care (Signed)
  Problem: Education: Goal: Ability to state activities that reduce stress will improve Outcome: Progressing   Problem: Coping: Goal: Ability to identify and develop effective coping behavior will improve Outcome: Progressing   Problem: Self-Concept: Goal: Ability to identify factors that promote anxiety will improve Outcome: Progressing Goal: Level of anxiety will decrease Outcome: Progressing Goal: Ability to modify response to factors that promote anxiety will improve Outcome: Progressing   Problem: Education: Goal: Utilization of techniques to improve thought processes will improve Outcome: Progressing Goal: Knowledge of the prescribed therapeutic regimen will improve Outcome: Progressing   Problem: Activity: Goal: Interest or engagement in leisure activities will improve Outcome: Progressing   Problem: Coping: Goal: Coping ability will improve Outcome: Progressing Goal: Will verbalize feelings Outcome: Progressing   Problem: Health Behavior/Discharge Planning: Goal: Ability to make decisions will improve Outcome: Progressing Goal: Compliance with therapeutic regimen will improve Outcome: Progressing   Problem: Role Relationship: Goal: Will demonstrate positive changes in social behaviors and relationships Outcome: Progressing   Problem: Safety: Goal: Ability to disclose and discuss suicidal ideas will improve Outcome: Progressing Goal: Ability to identify and utilize support systems that promote safety will improve Outcome: Progressing   Problem: Self-Concept: Goal: Will verbalize positive feelings about self Outcome: Progressing Goal: Level of anxiety will decrease Outcome: Progressing   Problem: Education: Goal: Ability to make informed decisions regarding treatment will improve Outcome: Progressing   Problem: Coping: Goal: Coping ability will improve Outcome: Progressing   Problem: Health Behavior/Discharge Planning: Goal: Identification of  resources available to assist in meeting health care needs will improve Outcome: Progressing   Problem: Medication: Goal: Compliance with prescribed medication regimen will improve Outcome: Progressing   Problem: Self-Concept: Goal: Ability to disclose and discuss suicidal ideas will improve Outcome: Progressing Goal: Will verbalize positive feelings about self Outcome: Progressing Note: Patient is not on track but is Problem: Activity: Goal: Imbalance in normal sleep/wake cycle will improve Outcome: Not Progressing Sleep imbalance unable to be assessed this shift. Note: Patient is not on track and is improving. Patient will maintain adherence and be monitored by provider to determine if a change in treatment plan is warranted.

## 2024-03-30 NOTE — Group Note (Signed)
 LCSW Group Therapy Note  Group Date: 03/30/2024 Start Time: 1315 End Time: 1345   Type of Therapy and Topic:  Group Therapy - Healthy vs Unhealthy Coping Skills  Participation Level:  Did Not Attend   Description of Group The focus of this group was to determine what unhealthy coping techniques typically are used by group members and what healthy coping techniques would be helpful in coping with various problems. Patients were guided in becoming aware of the differences between healthy and unhealthy coping techniques. Patients were asked to identify 2-3 healthy coping skills they would like to learn to use more effectively.  Therapeutic Goals Patients learned that coping is what human beings do all day long to deal with various situations in their lives Patients defined and discussed healthy vs unhealthy coping techniques Patients identified their preferred coping techniques and identified whether these were healthy or unhealthy Patients determined 2-3 healthy coping skills they would like to become more familiar with and use more often. Patients provided support and ideas to each other   Summary of Patient Progress:  X   Therapeutic Modalities Cognitive Behavioral Therapy Motivational Interviewing  Mathew Aguilar, LCSWA 03/30/2024  1:52 PM

## 2024-03-30 NOTE — Progress Notes (Signed)
   03/30/24 2200  Psych Admission Type (Psych Patients Only)  Admission Status Involuntary  Psychosocial Assessment  Patient Complaints Anxiety;Depression  Eye Contact Brief  Facial Expression Animated  Affect Appropriate to circumstance  Speech Logical/coherent  Interaction Assertive  Motor Activity Slow  Appearance/Hygiene In scrubs  Behavior Characteristics Cooperative  Mood Anxious  Aggressive Behavior  Effect No apparent injury  Thought Process  Coherency WDL  Content WDL  Delusions None reported or observed  Perception WDL  Hallucination None reported or observed  Judgment WDL  Confusion None  Danger to Self  Current suicidal ideation? Denies  Agreement Not to Harm Self Yes  Description of Agreement verbal  Danger to Others  Danger to Others None reported or observed

## 2024-03-30 NOTE — BHH Counselor (Signed)
 CSW completed chart review to find out about pt's ACT Team per request of provider.   ACT Team confirmed to be Waynard See, Team Lead is Romero Ruth.   CSW contact Romero Ruth and Romero sent over pt's psychiatric evaluation notes and CCA.   CSW forwarded notes to provider per her request.   Lum Croft, MSW, [facility] 03/30/2024 2:59 PM

## 2024-03-30 NOTE — H&P (Addendum)
 Psychiatric Admission Assessment Adult  Patient Identification: ADREYAN CARBAJAL MRN:  [id number] Date of Evaluation:  03/30/2024 Chief Complaint:  MDD (major depressive disorder) [F32.9] Major depressive disorder [F32.9]   History of Present Illness: Mr. Kau is a 73 y/o M with a PMH significant for MDD, anxiety, Borderline Personality Disorder, SI, HTN, DM2, and hypothyroidism. He initially presented to the [facility] ED on 03/28/24 for suicidal ideation. Pt does admit to reaching the planning stage, and considered taking a large amount of his at-home medications with the goal to overdose. He was subsequently placed under IVC at the ED. The patient sees Dr. Rudolpho at [facility], who helps manage his medications including Abilify  and Cymbalta . Mr. Sofia is compliant with taking his medications daily, but does voice concern about them not working.      Prior to his hospitalization, the pt was experiencing an increase in his depression and loneliness x4-5 days, difficulty sleeping and a decreased appetite. He does not attribute this to anything in particular. He denies irritability, feeling on edge, any hallucinations or delusions, panic attacks, nightmares and HI. He endorses feeling fatigued and anxious. Mr. Kohrs does appear to have sources of support, as he has two sisters and a brother that he talks to regularly over the phone. He attends church every Sunday. Otherwise he does live alone and is estranged from his son, whom he has not seen in about 7 years. Pt is admitted to the gero-psych unit for continued safety monitoring and management of his MDD.   Total Time spent with patient: 1 hour Sleep  Sleep: Has had a long history of difficulty sleeping, and has a mistrust regarding sleeping aids and medication due to taking a sleeping pill one time and not being able to walk in the morning. Past Psychiatric History: MDD, SI, BPD, dysthymia, schizoaffective disorder Psychiatric History:  ACT team, multiple psychiatric hospital stays Information collected from patient, chart review  Prev Dx/Sx: MDD, SI, Borderline Personality Disorder Current Psych Provider: Waynard Leavell ACT team Home Meds (current): Abilify  (2 mg) Cymbalta  (60 mg bid), synthroid , metformin , ompeprazole, crestor  Previous Med Trials: Has been on his current regimen for a long time, will be getting records from ACT team Therapy: Not currently, but has seen an outpatient psychiatrist in the past (10 to 15 years ago) whom he feels helped  Prior Psych Hospitalization: Multiple, last admission 6-7 years ago Prior Self Harm: Prior SI and attempt (OD) Prior Violence: No  Family Psych History: None Family Hx suicide: None  Social History:  Developmental Hx:  Educational Hx:  Occupational Hx:  Legal Hx: None Living Situation: Lives alone with visits from ACT nurse, wanting to move to the [place] area Spiritual Hx: Attends church every Sunday Access to weapons/lethal means: No access to guns   Substance History Alcohol: Drinks very occasionally  Tobacco: None Illicit drugs: None Prescription drug abuse:  Rehab hx: Multiple stays at inpatient psych hospitals, the last in [place] Is the patient at risk to self? Yes.    Has the patient been a risk to self in the past 6 months? Yes.    Has the patient been a risk to self within the distant past? Yes.    Is the patient a risk to others? No.  Has the patient been a risk to others in the past 6 months? No.  Has the patient been a risk to others within the distant past? No.   [place] Scale:  Flowsheet Row Admission (Current) from 03/29/2024 in  [facility] from 03/28/2024 in [facility] Emergency Department at [facility] ED from 09/18/2022 in [facility] Emergency Department at [facility]  C-SSRS RISK CATEGORY No Risk High Risk No Risk     Past Medical History:  Past Medical History:  Diagnosis Date    Anxiety    Asthma    Borderline personality disorder (HCC)    Depression    Dysrhythmia    Erectile dysfunction    Fatty liver    GERD (gastroesophageal reflux disease)    Hypertension    Hypothyroidism    Pulmonary nodule, right    Restless leg    Sleep apnea    Suicide attempt [facility])     Past Surgical History:  Procedure Laterality Date   AMPUTATION ARM     COLONOSCOPY     COLONOSCOPY WITH PROPOFOL  N/A 08/22/2018   Procedure: COLONOSCOPY WITH PROPOFOL ;  Surgeon: Gaylyn Gladis PENNER, MD;  Location: [facility] ENDOSCOPY;  Service: Endoscopy;  Laterality: N/A;   Family History:  Family History  Family history unknown: Yes    Social History:  Social History   Substance and Sexual Activity  Alcohol Use Not Currently   Comment: history of ETOH abuse - sober several months     Social History   Substance and Sexual Activity  Drug Use No      Allergies:  No Known Allergies Lab Results:  Results for orders placed or performed during the hospital encounter of 03/28/24 (from the past 48 hours)  Comprehensive metabolic panel     Status: Abnormal   Collection Time: 03/28/24  3:32 PM  Result Value Ref Range   Sodium 137 135 - 145 mmol/L   Potassium 4.0 3.5 - 5.1 mmol/L   Chloride 105 98 - 111 mmol/L   CO2 20 (L) 22 - 32 mmol/L   Glucose, Bld 184 (H) 70 - 99 mg/dL    Comment: Glucose reference range applies only to samples taken after fasting for at least 8 hours.   BUN 14 8 - 23 mg/dL   Creatinine, Ser 9.60 (L) 0.61 - 1.24 mg/dL   Calcium  9.3 8.9 - 10.3 mg/dL   Total Protein 7.4 6.5 - 8.1 g/dL   Albumin 3.8 3.5 - 5.0 g/dL   AST 20 15 - 41 U/L   ALT 29 0 - 44 U/L   Alkaline Phosphatase 48 38 - 126 U/L   Total Bilirubin 0.9 0.0 - 1.2 mg/dL   GFR, Estimated >39 >39 mL/min    Comment: (NOTE) Calculated using the CKD-EPI Creatinine Equation (2021)    Anion gap 12 5 - 15    Comment: Performed at [facility], [address] [place], [place] [address]  Ethanol      Status: None   Collection Time: 03/28/24  3:32 PM  Result Value Ref Range   Alcohol, Ethyl (B) <15 <15 mg/dL    Comment: (NOTE) For medical purposes only. Performed at [facility], [address] [place], [place] [address]   cbc     Status: None   Collection Time: 03/28/24  3:32 PM  Result Value Ref Range   WBC 5.7 4.0 - 10.5 K/uL   RBC 5.19 4.22 - 5.81 MIL/uL   Hemoglobin 15.8 13.0 - 17.0 g/dL   HCT 54.0 60.9 - 47.9 %   MCV 88.4 80.0 - 100.0 fL   MCH 30.4 26.0 - 34.0 pg   MCHC 34.4 30.0 - 36.0 g/dL   RDW 87.3 88.4 -  15.5 %   Platelets 249 150 - 400 K/uL   nRBC 0.0 0.0 - 0.2 %    Comment: Performed at [facility], [address] [place], [place] [address]  Magnesium      Status: None   Collection Time: 03/28/24  3:32 PM  Result Value Ref Range   Magnesium  2.0 1.7 - 2.4 mg/dL    Comment: Performed at [facility], [address] [place], [place] [address]  Urine Drug Screen, Qualitative     Status: None   Collection Time: 03/28/24  3:40 PM  Result Value Ref Range   Tricyclic, Ur Screen NONE DETECTED NONE DETECTED   Amphetamines, Ur Screen NONE DETECTED NONE DETECTED   MDMA (Ecstasy)Ur Screen NONE DETECTED NONE DETECTED   Cocaine Metabolite,Ur [place] NONE DETECTED NONE DETECTED   Opiate, Ur Screen NONE DETECTED NONE DETECTED   Phencyclidine (PCP) Ur S NONE DETECTED NONE DETECTED   Cannabinoid 50 Ng, Ur [place] NONE DETECTED NONE DETECTED   Barbiturates, Ur Screen NONE DETECTED NONE DETECTED   Benzodiazepine, Ur Scrn NONE DETECTED NONE DETECTED   Methadone Scn, Ur NONE DETECTED NONE DETECTED    Comment: (NOTE) Tricyclics + metabolites, urine    Cutoff 1000 ng/mL Amphetamines + metabolites, urine  Cutoff 1000 ng/mL MDMA (Ecstasy), urine              Cutoff 500 ng/mL Cocaine Metabolite, urine          Cutoff 300 ng/mL Opiate + metabolites, urine        Cutoff 300 ng/mL Phencyclidine (PCP), urine         Cutoff 25 ng/mL Cannabinoid, urine                  Cutoff 50 ng/mL Barbiturates + metabolites, urine  Cutoff 200 ng/mL Benzodiazepine, urine              Cutoff 200 ng/mL Methadone, urine                   Cutoff 300 ng/mL  The urine drug screen provides only a preliminary, unconfirmed analytical test result and should not be used for non-medical purposes. Clinical consideration and professional judgment should be applied to any positive drug screen result due to possible interfering substances. A more specific alternate chemical method must be used in order to obtain a confirmed analytical result. Gas chromatography / mass spectrometry (GC/MS) is the preferred confirm atory method. Performed at [facility], [address] [place], [place] [address]   SARS Coronavirus 2 by RT PCR (hospital order, performed in [facility] hospital lab) *cepheid single result test* Anterior Nasal Swab     Status: None   Collection Time: 03/29/24  2:23 AM   Specimen: Anterior Nasal Swab  Result Value Ref Range   SARS Coronavirus 2 by RT PCR NEGATIVE NEGATIVE    Comment: (NOTE) SARS-CoV-2 target nucleic acids are NOT DETECTED.  The SARS-CoV-2 RNA is generally detectable in upper and lower respiratory specimens during the acute phase of infection. The lowest concentration of SARS-CoV-2 viral copies this assay can detect is 250 copies / mL. A negative result does not preclude SARS-CoV-2 infection and should not be used as the sole basis for treatment or other patient management decisions.  A negative result may occur with improper specimen collection / handling, submission of specimen other than nasopharyngeal swab, presence of viral mutation(s) within the areas targeted by this assay, and inadequate number of viral copies (<250 copies /  mL). A negative result must be combined with clinical observations, patient history, and epidemiological information.  Fact Sheet for Patients:   RoadLapTop.co.za  Fact Sheet  for Healthcare Providers: http://kim-miller.com/  This test is not yet approved or  cleared by the [place]  FDA and has been authorized for detection and/or diagnosis of SARS-CoV-2 by FDA under an Emergency Use Authorization (EUA).  This EUA will remain in effect (meaning this test can be used) for the duration of the COVID-19 declaration under Section 564(b)(1) of the Act, 21 U.S.C. section 360bbb-3(b)(1), unless the authorization is terminated or revoked sooner.  Performed at [facility], [address] [place], [place] [address]     Blood Alcohol level:  Lab Results  Component Value Date   [facility] <15 03/28/2024   ETH <10 04/22/2018    Metabolic Disorder Labs:  Lab Results  Component Value Date   HGBA1C 5.2 02/28/2016   Lab Results  Component Value Date   PROLACTIN 6.2 02/28/2016   Lab Results  Component Value Date   CHOL 139 02/28/2016   TRIG 124 02/28/2016   HDL 43 02/28/2016   CHOLHDL 3.2 02/28/2016   VLDL 25 02/28/2016   LDLCALC 71 02/28/2016   LDLCALC 109 (H) 08/22/2015    Current Medications: Current Facility-Administered Medications  Medication Dose Route Frequency Provider Last Rate Last Admin   acetaminophen  (TYLENOL ) tablet 650 mg  650 mg Oral Q6H PRN Trudy Carwin, NP       alum & mag hydroxide-simeth (MAALOX/MYLANTA) 200-200-20 MG/5ML suspension 30 mL  30 mL Oral Q4H PRN Trudy Carwin, NP       hydrOXYzine  (ATARAX ) tablet 25 mg  25 mg Oral TID PRN Foust, Katy L, NP       magnesium  hydroxide (MILK OF MAGNESIA) suspension 30 mL  30 mL Oral Daily PRN Trudy Carwin, NP       OLANZapine  (ZYPREXA ) injection 5 mg  5 mg Intramuscular TID PRN Trudy Carwin, NP       OLANZapine  zydis (ZYPREXA ) disintegrating tablet 5 mg  5 mg Oral TID PRN Trudy Carwin, NP       PTA Medications: Medications Prior to Admission  Medication Sig Dispense Refill Last Dose/Taking   ARIPiprazole  (ABILIFY ) 2 MG tablet Take 2 mg by mouth daily.       DULoxetine  (CYMBALTA ) 60 MG capsule Take 2 capsules (120 mg total) by mouth daily. (Patient taking differently: Take 60 mg by mouth daily.) 60 capsule 0    finasteride  (PROSCAR ) 5 MG tablet Take 1 tablet (5 mg total) by mouth daily. 90 tablet 3    levothyroxine  (SYNTHROID , LEVOTHROID) 75 MCG tablet Take 1 tablet (75 mcg total) by mouth daily before breakfast. 30 tablet 0    metFORMIN  (GLUCOPHAGE ) 1000 MG tablet Take 1,000 mg by mouth 2 (two) times daily.      omega-3 acid ethyl esters (LOVAZA) 1 g capsule Take 1 g by mouth daily.      omeprazole (PRILOSEC) 20 MG capsule Take 20 mg by mouth daily.      rosuvastatin  (CRESTOR ) 10 MG tablet Take 10 mg by mouth at bedtime.       Psychiatric Specialty Exam:  Presentation  General Appearance: In scrubs, appropriate, sitting comfortably Eye Contact: Normal Speech: Understandable, logical Speech Volume: Normal    Mood and Affect  Mood: Pleasant but depressed Affect: A bit flat, depressed   Thought Process  Thought Processes: Linear Orientation: oriented to time, person, place Thought Content: a bit disorganized but coherent  Hallucinations: No Ideas of Reference:No Suicidal Thoughts:Not currently Homicidal Thoughts:No  Sensorium  Memory: Intact Judgment: Impaired Insight: Shallow  Executive Functions  Concentration:Fair Attention Span:Fair Recall:Fair Fund of Knowledge:Fair Language:Fair  Psychomotor Activity  Psychomotor Activity: WNL  Assets  Assets: ACT team, desire for improvement, sibling involvement   Musculoskeletal: Strength & Muscle Tone: within normal limits Gait & Station: broad based  Physical Exam: Physical Exam Vitals and nursing note reviewed.  HENT:     Head: Normocephalic.  Cardiovascular:     Rate and Rhythm: Normal rate.  Neurological:     Mental Status: He is alert.    Review of Systems  Constitutional: Negative.   HENT: Negative.    Eyes: Negative.   Skin: Negative.    Blood pressure  119/69, pulse 65, temperature 98.1 F (36.7 C), resp. rate 18, height 5' (1.524 m), weight 88.5 kg, SpO2 98%. Body mass index is 38.08 kg/m.  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  He will be monitored closely on the inpatient unit.  Treatment Plan Summary:  Safety and Monitoring:             -- Involuntary admission to inpatient psychiatric unit for safety, stabilization and treatment, will continue to assess for the need for IVF             -- Daily contact with patient to assess and evaluate symptoms and progress in treatment             -- Patient's case to be discussed in multi-disciplinary team meeting             -- Observation Level: q15 minute checks             -- Vital signs:  q12 hours             -- Precautions: suicide, elopement, and assault   2. Psychiatric Diagnoses and Treatment:                Major Depressive Disorder, recurrent, without psychosis Viewed the documentation from the ACT team and restarted his home medicine of Abilify  2 mg daily and Cymbalta  60 mg.  Will clarify if his home dosage is 120 mg.   -- The risks/benefits/side-effects/alternatives to this medication were discussed in detail with the patient and time was given for questions. The patient consents to medication trial.                -- Metabolic profile and EKG monitoring obtained while on an atypical antipsychotic (BMI: Lipid Panel: HbgA1c: QTc:)              -- Encouraged patient to participate in unit milieu and in scheduled group therapies                            3. Medical Issues Being Addressed:   No  urgent medical needs noted   4. Discharge Planning:              -- Social work and case management to assist with discharge planning and identification of hospital follow-up needs prior to discharge             --  Estimated LOS: 5-7 days             -- Discharge Concerns: Need to establish a safety plan; Medication compliance and effectiveness             -- Discharge Goals: Return home with outpatient referrals follow ups  Physician Treatment Plan for Primary Diagnosis: MDD (major depressive disorder) Long Term Goal(s): Improvement in symptoms so as ready for discharge  Short Term Goals: Ability to identify changes in lifestyle to reduce recurrence of condition will improve, Ability to verbalize feelings will improve, Ability to disclose and discuss suicidal ideas, Ability to demonstrate self-control will improve, Ability to identify and develop effective coping behaviors will improve, and Ability to maintain clinical measurements within normal limits will improve  Physician Treatment Plan for Secondary Diagnosis: Principal Problem:   MDD (major depressive disorder) Active Problems:   Major depressive disorder  Long Term Goal(s): Improvement in symptoms so as ready for discharge  Short Term Goals: Ability to identify changes in lifestyle to reduce recurrence of condition will improve, Ability to verbalize feelings will improve, Ability to disclose and discuss suicidal ideas, and Ability to identify and develop effective coping behaviors will improve  I certify that inpatient services furnished can reasonably be expected to improve the patient's condition.    Recardo Poche, Student-PA 7/17/20252:35 PM

## 2024-03-30 NOTE — Group Note (Signed)
 Date:  03/30/2024 Time:  4:13 PM  Group Topic/Focus:  Building Self Esteem:   The Focus of this group is helping patients become aware of the effects of self-esteem on their lives, the things they and others do that enhance or undermine their self-esteem, seeing the relationship between their level of self-esteem and the choices they make and learning ways to enhance self-esteem.    Participation Level:  Did Not Attend   Mathew Aguilar 03/30/2024, 4:13 PM

## 2024-03-30 NOTE — Group Note (Signed)
 Date:  03/30/2024 Time:  11:04 AM  Group Topic/Focus:  Healthy Communication:   The focus of this group is to discuss communication, barriers to communication, as well as healthy ways to communicate with others.    Participation Level:  Active  Participation Quality:  Appropriate  Affect:  Appropriate  Cognitive:  Appropriate  Insight: Appropriate  Engagement in Group:  Engaged  Modes of Intervention:  Discussion   Arland Nutting 03/30/2024, 11:04 AM

## 2024-03-30 NOTE — Progress Notes (Signed)
   03/30/24 1000  Psych Admission Type (Psych Patients Only)  Admission Status Involuntary  Psychosocial Assessment  Patient Complaints Anxiety;Depression  Eye Contact Brief  Facial Expression Animated  Affect Appropriate to circumstance  Speech Logical/coherent  Interaction Assertive  Motor Activity Slow  Appearance/Hygiene In scrubs  Behavior Characteristics Cooperative  Mood Anxious  Thought Process  Coherency WDL  Content WDL  Delusions None reported or observed  Perception WDL  Hallucination None reported or observed  Judgment WDL  Confusion None  Danger to Self  Current suicidal ideation? Denies  Agreement Not to Harm Self Yes  Description of Agreement Verbal  Danger to Others  Danger to Others None reported or observed   Lyrik presents this shift with a pleasant demeanor. Observed pacing the unit, and states he feels anxious because he wants to start his medications. States that he had not taken his meds for the last few days, but was able to discuss his concerns with the psychiatrist and his medications were restarted. His pacing decreased significantly afterwards, and Pasha spent the remainder of the shift socializing with staff and peers. Denies SI/HI/SH. Denies AVH.

## 2024-03-30 NOTE — Group Note (Signed)
 Recreation Therapy Group Note   Group Topic:Healthy Support Systems  Group Date: 03/30/2024 Start Time: 1400 End Time: 1450 Facilitators: Celestia Jeoffrey BRAVO, LRT, CTRS Location: Dayroom  Group Description: Straw Bridge. In groups or individually, patients were given 10 plastic drinking straws and an equal length of masking tape. Using the materials provided, patients were instructed to build a free-standing bridge-like structure to suspend an everyday item (ex: deck of cards) off the floor or table surface. All materials were required to be used in Secondary school teacher. LRT facilitated post-activity discussion reviewing the importance of having strong and healthy support systems in our lives. LRT discussed how the people in our lives serve as the tape and the deck of cards we placed on top of our straw structure are the stressors we face in daily life. LRT and pts discussed what happens in our life when things get too heavy for us , and we don't have strong supports outside of the hospital. Pt shared 2 of their healthy supports in their life aloud in the group.   Goal Area(s) Addressed:  Patient will identify 2 healthy supports in their life. Patient will identify skills to successfully complete activity. Patient will identify correlation of this activity to life post-discharge.  Patient will build on frustration tolerance skills. Patient will increase team building and communication skills.    Affect/Mood: N/A   Participation Level: Did not attend    Clinical Observations/Individualized Feedback: Patient did not attend group.   Plan: Continue to engage patient in RT group sessions 2-3x/week.   Jeoffrey BRAVO Celestia, LRT, CTRS 03/30/2024 4:47 PM

## 2024-03-30 NOTE — Telephone Encounter (Signed)
 Error

## 2024-03-30 NOTE — Progress Notes (Signed)
 Pt awake all night, refused meds d/t past experience. I took something for sleep and woke up the next morning unable to walk.  03/29/24 1915  Psych Admission Type (Psych Patients Only)  Admission Status Involuntary  Psychosocial Assessment  Patient Complaints Anxiety;Depression  Eye Contact Brief  Facial Expression Worried  Affect Depressed  Speech Logical/coherent  Interaction Assertive  Motor Activity Slow  Appearance/Hygiene In scrubs  Behavior Characteristics Cooperative  Mood Pleasant  Aggressive Behavior  Effect No apparent injury  Thought Process  Coherency WDL  Content WDL  Delusions None reported or observed  Perception WDL  Hallucination None reported or observed  Judgment WDL  Confusion None  Danger to Self  Current suicidal ideation? Denies  Agreement Not to Harm Self Yes  Description of Agreement Verbal  Danger to Others  Danger to Others None reported or observed

## 2024-03-30 NOTE — BHH Suicide Risk Assessment (Signed)
 [facility] Admission Suicide Risk Assessment   Nursing information obtained from:  Patient Demographic factors:  Male, Age 73 or older, Divorced or widowed, Caucasian Current Mental Status:  NA Loss Factors:  NA Historical Factors:  NA Risk Reduction Factors:  Positive social support  Total Time spent with patient: 30 minutes Principal Problem: MDD (major depressive disorder) Diagnosis:  Principal Problem:   MDD (major depressive disorder) Active Problems:   Major depressive disorder  Subjective Data: 73 year old man presented with a chief complaint of worsening depression over the past four to five months. He described a significant decline in mood, with symptoms including persistent sadness, feelings of hopelessness, and worthlessness. He indicated that his current medication regimen, which he has been on for a prolonged period, appears to have lost efficacy during this time. Specific details regarding his medications were not available, though he affirmed adherence to his prescribed regimen. Patient is admitted to [facility] unit with Q15 min safety monitoring. Multidisciplinary team approach is offered. Medication management; group/milieu therapy is offered.   Continued Clinical Symptoms:  Alcohol Use Disorder Identification Test Final Score (AUDIT): 0 The Alcohol Use Disorders Identification Test, Guidelines for Use in Primary Care, Second Edition.  World Science writer [facility]). Score between 0-7:  no or low risk or alcohol related problems. Score between 8-15:  moderate risk of alcohol related problems. Score between 16-19:  high risk of alcohol related problems. Score 20 or above:  warrants further diagnostic evaluation for alcohol dependence and treatment.   CLINICAL FACTORS:   Depression:   Insomnia   Musculoskeletal: Strength & Muscle Tone: within normal limits Gait & Station: normal Patient leans: N/A  Psychiatric Specialty Exam:  Presentation  General Appearance:   Appropriate for Environment; Casual  Eye Contact: Fair  Speech: Clear and Coherent  Speech Volume: Normal  Handedness: Right   Mood and Affect  Mood: Depressed; Dysphoric  Affect: Depressed; Flat   Thought Process  Thought Processes: Coherent  Descriptions of Associations:Intact  Orientation:Full (Time, Place and Person)  Thought Content:Illogical  History of Schizophrenia/Schizoaffective disorder:No data recorded Duration of Psychotic Symptoms:No data recorded Hallucinations:Hallucinations: None  Ideas of Reference:None  Suicidal Thoughts:Suicidal Thoughts: Yes, Passive  Homicidal Thoughts:Homicidal Thoughts: No   Sensorium  Memory: Immediate Fair; Recent Fair; Remote Poor  Judgment: Impaired  Insight: Shallow   Executive Functions  Concentration: Poor  Attention Span: Fair  Recall: [employer] of Knowledge: Fair  Language: Fair   Psychomotor Activity  Psychomotor Activity: Psychomotor Activity: Normal   Assets  Assets: Communication Skills; Desire for Improvement; Physical Health; Resilience   Sleep  Sleep: Sleep: Poor    Physical Exam: Physical Exam ROS Blood pressure 119/69, pulse 65, temperature 98.1 F (36.7 C), resp. rate 18, height 5' (1.524 m), weight 88.5 kg, SpO2 98%. Body mass index is 38.08 kg/m.   COGNITIVE FEATURES THAT CONTRIBUTE TO RISK:  None    SUICIDE RISK:   Mild:  Suicidal ideation of limited frequency, intensity, duration, and specificity.  There are no identifiable plans, no associated intent, mild dysphoria and related symptoms, good self-control (both objective and subjective assessment), few other risk factors, and identifiable protective factors, including available and accessible social support.  PLAN OF CARE: Patient is admitted to [facility] psych unit with Q15 min safety monitoring. Multidisciplinary team approach is offered. Medication management; group/milieu therapy is offered.   I  certify that inpatient services furnished can reasonably be expected to improve the patient's condition.   Allyn Foil, MD 03/30/2024, 3:33 PM

## 2024-03-30 NOTE — Plan of Care (Signed)
 Patient alert and oriented x4. Denies SI, HI, AVH and pain. No scheduled medications per MAR. PRN meds available, but pt states I don't take much medicines because I took something for sleep before and woke and couldn't walk. Support and encouragement provided.  Routine safety checks conducted every 15 minutes.  Patient informed to notify staff with problems or concerns. Patient verbally contracts for safety at this time. Patient interacts well with others on the unit.  Patient remains safe at this time.  Problem: Education: Goal: Ability to state activities that reduce stress will improve Outcome: Not Progressing   Problem: Health Behavior/Discharge Planning: Goal: Ability to make decisions will improve Outcome: Not Progressing Goal: Compliance with therapeutic regimen will improve Outcome: Not Progressing   Problem: Safety: Goal: Ability to disclose and discuss suicidal ideas will improve Outcome: Not Progressing Goal: Ability to identify and utilize support systems that promote safety will improve Outcome: Not Progressing

## 2024-03-31 DIAGNOSIS — F329 Major depressive disorder, single episode, unspecified: Secondary | ICD-10-CM | POA: Diagnosis not present

## 2024-03-31 MED ORDER — ARIPIPRAZOLE 5 MG PO TABS
5.0000 mg | ORAL_TABLET | Freq: Every day | ORAL | Status: DC
  Administered 2024-04-01 – 2024-04-03 (×3): 5 mg via ORAL
  Filled 2024-03-31 (×3): qty 1

## 2024-03-31 MED ORDER — DULOXETINE HCL 60 MG PO CPEP
120.0000 mg | ORAL_CAPSULE | Freq: Every day | ORAL | Status: DC
  Administered 2024-04-01 – 2024-04-03 (×3): 120 mg via ORAL
  Filled 2024-03-31 (×3): qty 2

## 2024-03-31 NOTE — Group Note (Signed)
 Date:  03/31/2024 Time:  1:39 AM  Group Topic/Focus:  Coping With Mental Health Crisis:   The purpose of this group is to help patients identify strategies for coping with mental health crisis.  Group discusses possible causes of crisis and ways to manage them effectively.    Participation Level:  Active  Participation Quality:  Appropriate  Affect:  Flat  Cognitive:  Alert  Insight: None  Engagement in Group:  Engaged  Modes of Intervention:  Discussion, Education, and Orientation  Additional Comments:    Laymon ONEIDA Finder 03/31/2024, 1:39 AM

## 2024-03-31 NOTE — Plan of Care (Signed)
  Problem: Self-Concept: Goal: Level of anxiety will decrease Outcome: Progressing Goal: Ability to modify response to factors that promote anxiety will improve Outcome: Progressing   

## 2024-03-31 NOTE — Group Note (Signed)
 Recreation Therapy Group Note   Group Topic:Self-Esteem  Group Date: 03/31/2024 Start Time: 1405 End Time: 1500 Facilitators: Celestia Jeoffrey BRAVO, LRT, CTRS Location: Dayroom  Group Description: Positive Affirmation Bingo. LRT and patients played multiple games of Bingo with music playing in the background. LRT and pts discussed what a positive affirmation is, the importance of speaking kindly to yourself, and the use of this as a coping skill.   Goal Area(s) Addressed: Patient will learn positive affirmations.  Patient will engage in recreation activity.  Patient will increase communication.    Affect/Mood: Appropriate   Participation Level: Minimal    Clinical Observations/Individualized Feedback: Mathew Aguilar was originally present in group. Pt was noted to be falling asleep sitting up. Pt then left group and did not return.  Plan: Continue to engage patient in RT group sessions 2-3x/week.   [address], LRT, CTRS 03/31/2024 5:02 PM

## 2024-03-31 NOTE — Group Note (Signed)
 LCSW Group Therapy Note  Group Date: 03/31/2024 Start Time: 1515 End Time: 1600   Type of Therapy and Topic:  Group Therapy: Using I Statements  Participation Level:  Minimal  Description of Group:  Patients were asked to provide details of some interpersonal conflicts they have experienced. Patients were then educated about "I" statements, communication which focuses on feelings or views of the speaker rather than what the other person is doing. T group members were asked to reflect on past conflicts and to provide specific examples for utilizing "I" statements.  Therapeutic Goals:  Patients will verbalize understanding of ineffective communication and effective communication. Patients will be able to empathize with whom they are having conflict. Patients will practice effective communication in the form of "I" statements.    Summary of Patient Progress:  The patient was present throughout the session and proved open to feedback from CSW and peers. Patient demonstrated some insight into the subject matter, was respectful of peers, and was present throughout the entire session.  Therapeutic Modalities:   Cognitive Behavioral Therapy Solution-Focused Therapy    Lum [name], [place] 03/31/2024  4:24 PM

## 2024-03-31 NOTE — BHH Counselor (Signed)
 Adult Comprehensive Assessment  Patient ID: Mathew Aguilar, male   DOB: Feb 07, 1951, 73 y.o.   MRN: [id number]  Information Source: Information source: Patient  Current Stressors:  Patient states their primary concerns and needs for treatment are::  I got depressed, first time I got that bad in a long time Patient states their goals for this hospitilization and ongoing recovery are:: Get out and not have to come back Educational / Learning stressors: None reported Employment / Job issues: None reported Family Relationships: No, I get a long with everybody Financial / Lack of resources (include bankruptcy): None reported Housing / Lack of housing: None reported Physical health (include injuries & life threatening diseases): None reported Social relationships: No, I got a son and I haven't seen in 10 years Substance abuse: None reported Bereavement / Loss: None reported  Living/Environment/Situation:  Living Arrangements: Alone Living conditions (as described by patient or guardian): Pt reports thats he lives alone in an apartment in [place] Who else lives in the home?: Pt reports he lives alone How long has patient lived in current situation?: Pt does not report What is atmosphere in current home: Comfortable  Family History:  Marital status: Divorced Divorced, when?: Pt reports 2016, he can't remember What types of issues is patient dealing with in the relationship?: Pt does not report Additional relationship information: None reported Are you sexually active?: No What is your sexual orientation?: heterosexual Has your sexual activity been affected by drugs, alcohol, medication, or emotional stress?: no Does patient have children?: Yes How many children?: 1 How is patient's relationship with their children?: Pt reports they have no relationship with their child, reports they have not spoken in 10 years  Childhood History:  By whom was/is the patient raised?: Both  parents Additional childhood history information: None reported Description of patient's relationship with caregiver when they were a child: Good, we always got what I wanted for Christmas Patient's description of current relationship with people who raised him/her: Pt reports they are deceased How were you disciplined when you got in trouble as a child/adolescent?: I was pretty good as a kid Does patient have siblings?: Yes Number of Siblings: 3 Description of patient's current relationship with siblings: brother, two sisters, [place]  Did patient suffer any verbal/emotional/physical/sexual abuse as a child?: No Did patient suffer from severe childhood neglect?: No Has patient ever been sexually abused/assaulted/raped as an adolescent or adult?: No Was the patient ever a victim of a crime or a disaster?: No Witnessed domestic violence?: No Has patient been affected by domestic violence as an adult?: No  Education:  Highest grade of school patient has completed: 8th grade Currently a student?: No Learning disability?: Yes What learning problems does patient have?: Yes, we couldn't learn, 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?: No  Financial Resources:   Surveyor, quantity resources: [employer], [employer], [employer]rapist, nutritional) Does patient have a Lawyer or guardian?: No  Alcohol/Substance Abuse:   What has been your use of drugs/alcohol within the last 12 months?: None  reported If attempted suicide, did drugs/alcohol play a  role in this?: No Alcohol/Substance Abuse Treatment Hx: Denies past history If yes, describe treatment: N/A Has alcohol/substance abuse ever caused legal problems?: No  Social Support System:   Forensic psychologist System: None Describe Community Support System: I support myself Type of faith/religion: Baptist How does patient's faith help to cope with current illness?: I go to church every Sunday  Leisure/Recreation:   Do You Have Hobbies?: Yes Leisure and Hobbies: Television  Strengths/Needs:   What is the patient's perception of their strengths?: Anything I want to be good at Patient states they can use these personal strengths during their treatment to contribute to their recovery: Pt does not report Patient states these barriers may affect/interfere with their treatment: None reported Patient states these barriers may affect their return to the community: None reported Other important information patient would like considered in planning for their treatment: None reported  Discharge Plan:   Currently receiving community mental health services: Yes (From Whom) ([facility]) Patient states concerns and preferences for aftercare planning are: Pt would like to continue with [facility] Patient states they will know when they are safe and ready for discharge when: I feel like I'll be safe Does patient have access to transportation?: Yes Does patient have financial barriers related to discharge medications?: No Patient description of barriers related to discharge medications: N/A Will patient be returning to same living situation after discharge?: Yes  Summary/Recommendations:   Summary and Recommendations (to be completed by the evaluator): Patient is a 73 year-old male [place], [place] [facility] According to consult note, presented with a chief complaint of worsening depression over the past four to five months. He described a significant decline in  mood, with symptoms including persistent sadness, feelings of hopelessness, and worthlessness. He indicated that his current medication regimen, which he has been on for a prolonged period, appears to have lost efficacy during this time. Upon assessment today, pt reports he has become depressed living alone. He reports he goes to [employer] daily and participates in groups there in order to meet people but he still feels lonely.  Pt reports he has not spoken to his son in over 10 years. Pt's ACT team is [employer]. MDD (major depressive disorder) (F32.9)  Major depressive disorder (F32.9).  Recommendations include: crisis stabilization, therapeutic milieu, encourage group attendance and participation, medication management for mood stabilization and development of comprehensive mental wellness/sobriety plan.  Lum [name]. 03/31/2024

## 2024-03-31 NOTE — Progress Notes (Signed)
   03/31/24 1100  Psych Admission Type (Psych Patients Only)  Admission Status Involuntary  Psychosocial Assessment  Patient Complaints Anxiety;Depression  Eye Contact Brief  Facial Expression Animated  Affect Appropriate to circumstance  Speech Logical/coherent  Interaction Assertive  Motor Activity Tremors  Appearance/Hygiene In scrubs  Behavior Characteristics Appropriate to situation  Mood Depressed  Thought Process  Coherency WDL  Content WDL  Delusions None reported or observed  Perception WDL  Hallucination None reported or observed  Judgment WDL  Confusion None  Danger to Self  Current suicidal ideation? Denies  Agreement Not to Harm Self Yes  Description of Agreement verbal  Danger to Others  Danger to Others None reported or observed

## 2024-03-31 NOTE — BH IP Treatment Plan (Signed)
 Interdisciplinary Treatment and Diagnostic Plan Update  03/31/2024 Time of Session: 9:52 AM  Mathew Aguilar MRN: [id number]  Principal Diagnosis: MDD (major depressive disorder)  Secondary Diagnoses: Principal Problem:   MDD (major depressive disorder) Active Problems:   Major depressive disorder   Current Medications:  Current Facility-Administered Medications  Medication Dose Route Frequency Provider Last Rate Last Admin   acetaminophen  (TYLENOL ) tablet 650 mg  650 mg Oral Q6H PRN Trudy Carwin, NP       alum & mag hydroxide-simeth (MAALOX/MYLANTA) 200-200-20 MG/5ML suspension 30 mL  30 mL Oral Q4H PRN Trudy Carwin, NP       ARIPiprazole  (ABILIFY ) tablet 2 mg  2 mg Oral Daily Jadapalle, Sree, MD   2 mg at 03/31/24 9144   DULoxetine  (CYMBALTA ) DR capsule 60 mg  60 mg Oral Daily Jadapalle, Sree, MD   60 mg at 03/31/24 9145   finasteride  (PROSCAR ) tablet 5 mg  5 mg Oral Daily Jadapalle, Sree, MD   5 mg at 03/31/24 9145   hydrOXYzine  (ATARAX ) tablet 25 mg  25 mg Oral TID PRN Foust, Katy L, NP       levothyroxine  (SYNTHROID ) tablet 75 mcg  75 mcg Oral QAC breakfast Jadapalle, Sree, MD   75 mcg at 03/31/24 0525   magnesium  hydroxide (MILK OF MAGNESIA) suspension 30 mL  30 mL Oral Daily PRN Trudy Carwin, NP       metFORMIN  (GLUCOPHAGE ) tablet 1,000 mg  1,000 mg Oral BID WC Jadapalle, Sree, MD   1,000 mg at 03/31/24 9145   OLANZapine  (ZYPREXA ) injection 5 mg  5 mg Intramuscular TID PRN Trudy Carwin, NP       OLANZapine  zydis (ZYPREXA ) disintegrating tablet 5 mg  5 mg Oral TID PRN Trudy Carwin, NP       pantoprazole  (PROTONIX ) EC tablet 40 mg  40 mg Oral Daily Jadapalle, Sree, MD   40 mg at 03/31/24 9144   rosuvastatin  (CRESTOR ) tablet 10 mg  10 mg Oral QHS Jadapalle, Sree, MD   10 mg at 03/30/24 2106   PTA Medications: Medications Prior to Admission  Medication Sig Dispense Refill Last Dose/Taking   ARIPiprazole  (ABILIFY ) 2 MG tablet Take 2 mg by mouth daily.      DULoxetine   (CYMBALTA ) 60 MG capsule Take 2 capsules (120 mg total) by mouth daily. (Patient taking differently: Take 60 mg by mouth daily.) 60 capsule 0    finasteride  (PROSCAR ) 5 MG tablet Take 1 tablet (5 mg total) by mouth daily. 90 tablet 3    levothyroxine  (SYNTHROID , LEVOTHROID) 75 MCG tablet Take 1 tablet (75 mcg total) by mouth daily before breakfast. 30 tablet 0    metFORMIN  (GLUCOPHAGE ) 1000 MG tablet Take 1,000 mg by mouth 2 (two) times daily.      omega-3 acid ethyl esters (LOVAZA) 1 g capsule Take 1 g by mouth daily.      omeprazole (PRILOSEC) 20 MG capsule Take 20 mg by mouth daily.      rosuvastatin  (CRESTOR ) 10 MG tablet Take 10 mg by mouth at bedtime.       Patient Stressors:    Patient Strengths:    Treatment Modalities: Medication Management, Group therapy, Case management,  1 to 1 session with clinician, Psychoeducation, Recreational therapy.   Physician Treatment Plan for Primary Diagnosis: MDD (major depressive disorder) Long Term Goal(s): Improvement in symptoms so as ready for discharge   Short Term Goals: Ability to identify changes in lifestyle to reduce recurrence of condition will improve Ability to  verbalize feelings will improve Ability to disclose and discuss suicidal ideas Ability to identify and develop effective coping behaviors will improve Ability to demonstrate self-control will improve Ability to maintain clinical measurements within normal limits will improve  Medication Management: Evaluate patient's response, side effects, and tolerance of medication regimen.  Therapeutic Interventions: 1 to 1 sessions, Unit Group sessions and Medication administration.  Evaluation of Outcomes: Progressing  Physician Treatment Plan for Secondary Diagnosis: Principal Problem:   MDD (major depressive disorder) Active Problems:   Major depressive disorder  Long Term Goal(s): Improvement in symptoms so as ready for discharge   Short Term Goals: Ability to identify  changes in lifestyle to reduce recurrence of condition will improve Ability to verbalize feelings will improve Ability to disclose and discuss suicidal ideas Ability to identify and develop effective coping behaviors will improve Ability to demonstrate self-control will improve Ability to maintain clinical measurements within normal limits will improve     Medication Management: Evaluate patient's response, side effects, and tolerance of medication regimen.  Therapeutic Interventions: 1 to 1 sessions, Unit Group sessions and Medication administration.  Evaluation of Outcomes: Progressing   RN Treatment Plan for Primary Diagnosis: MDD (major depressive disorder) Long Term Goal(s): Knowledge of disease and therapeutic regimen to maintain health will improve  Short Term Goals: Ability to remain free from injury will improve, Ability to verbalize frustration and anger appropriately will improve, Ability to demonstrate self-control, Ability to participate in decision making will improve, Ability to verbalize feelings will improve, Ability to disclose and discuss suicidal ideas, Ability to identify and develop effective coping behaviors will improve, and Compliance with prescribed medications will improve  Medication Management: RN will administer medications as ordered by provider, will assess and evaluate patient's response and provide education to patient for prescribed medication. RN will report any adverse and/or side effects to prescribing provider.  Therapeutic Interventions: 1 on 1 counseling sessions, Psychoeducation, Medication administration, Evaluate responses to treatment, Monitor vital signs and CBGs as ordered, Perform/monitor CIWA, COWS, AIMS and Fall Risk screenings as ordered, Perform wound care treatments as ordered.  Evaluation of Outcomes: Progressing   LCSW Treatment Plan for Primary Diagnosis: MDD (major depressive disorder) Long Term Goal(s): Safe transition to  appropriate next level of care at discharge, Engage patient in therapeutic group addressing interpersonal concerns.  Short Term Goals: Engage patient in aftercare planning with referrals and resources, Increase social support, Increase ability to appropriately verbalize feelings, Increase emotional regulation, Facilitate acceptance of mental health diagnosis and concerns, Facilitate patient progression through stages of change regarding substance use diagnoses and concerns, Identify triggers associated with mental health/substance abuse issues, and Increase skills for wellness and recovery  Therapeutic Interventions: Assess for all discharge needs, 1 to 1 time with Social worker, Explore available resources and support systems, Assess for adequacy in community support network, Educate family and significant other(s) on suicide prevention, Complete Psychosocial Assessment, Interpersonal group therapy.  Evaluation of Outcomes: Progressing   Progress in Treatment: Attending groups: Yes. and No. Participating in groups: Yes. and No. Taking medication as prescribed: Yes. Toleration medication: Yes. Family/Significant other contact made: No, will contact:  CSW contacted pt's ACT Team [place]) per his request   Patient understands diagnosis: Yes. Discussing patient identified problems/goals with staff: Yes. Medical problems stabilized or resolved: Yes. Denies suicidal/homicidal ideation: Yes. Issues/concerns per patient self-inventory: No. Other: None   New problem(s) identified: No, Describe:  None identified   New Short Term/Long Term Goal(s): elimination of symptoms of psychosis, medication management  for mood stabilization; elimination of SI thoughts; development of comprehensive mental wellness  plan.   Patient Goals:  I want to get better real soon so I can go home  Discharge Plan or Barriers: CSW will assist with appropriate discharge planning   Reason for Continuation of  Hospitalization: Depression Medication stabilization  Estimated Length of Stay: 1 to 7 days  Last 3 [place] Suicide Severity Risk Score: Flowsheet Row Admission (Current) from 03/29/2024 in [facility] ED from 03/28/2024 in [facility] Emergency Department at [facility] ED from 09/18/2022 in [facility] Department at [facility]  C-SSRS RISK CATEGORY No Risk High Risk No Risk    Last PHQ 2/9 Scores:     No data to display          Scribe for Treatment Team: Lum [name], ISRA[place] 03/31/2024 10:27 AM

## 2024-03-31 NOTE — Plan of Care (Signed)
°  Problem: Education: Goal: Ability to state activities that reduce stress will improve Outcome: Progressing   Problem: Coping: Goal: Ability to identify and develop effective coping behavior will improve Outcome: Progressing   Problem: Self-Concept: Goal: Ability to identify factors that promote anxiety will improve Outcome: Progressing Goal: Level of anxiety will decrease Outcome: Progressing Goal: Ability to modify response to factors that promote anxiety will improve Outcome: Progressing   Problem: Education: Goal: Utilization of techniques to improve thought processes will improve Outcome: Progressing Goal: Knowledge of the prescribed therapeutic regimen will improve Outcome: Progressing

## 2024-03-31 NOTE — Group Note (Signed)
 Physical/Occupational Therapy Group Note  Group Topic: UE Therex   Group Date: 03/31/2024 Start Time: 1300 End Time: 1340 Facilitators: Clive Warren CROME, OT     Group Description: Group instructed in series of upper extremities exercises, aimed to promote strength, flexibility, range of motion and functional endurance.  Patients provided cuing for proper mechanics and proper pace of exercise; exercises adjusted as necessary for individualized patient needs.  Patient also engaged in cognitive components throughout session, working to integrate attention to task, command following, turn-taking and appropriate social interaction throughout session.  Allowed to ask questions as appropriate, and encouraged to identify specific exercises that they could complete independently outside of group sessions.   Therapeutic Goal(s): Demonstrate appropriate performance of upper extremity exercises to promote strength, flexibility, range of motion and functional endurance Identify 2-3 specific upper extremity exercises to complete as home exercise program outside of group session   Individual Participation: Pt agreeable to joining but only wanted to watch. Pt sat away from other participants and watched, noted to close his eyes a couple times. Briefly left and came back. Did not actively participate but remained for duration of session (aside from briefly leaving).   Participation Level: Minimal   Participation Quality: Independent   Behavior: On-looking, Passive, and Reserved   Speech/Thought Process: Barely audible   Affect/Mood: Flat   Insight: Hard to determine based on minimal participation   Judgement: Hard to determine based on minimal participation   Modes of Intervention: Activity, Clarification, Discussion, Education, Exploration, Problem-solving, Rapport Building, Socialization, and Support  Patient Response to Interventions:  Disengaged   Plan: Continue to engage patient in PT/OT groups  1 - 2x/week.   Rashell Shambaugh R., MPH, MS, OTR/L ascom [phone] 03/31/24, 2:25 PM

## 2024-03-31 NOTE — Progress Notes (Signed)
 [facility] MD Progress Note  03/31/2024 1:49 PM [facility]  MRN:  [id number]  Mr. Tapp is a 73 y/o M with a PMH significant for MDD, anxiety, Borderline Personality Disorder, SI, HTN, DM2, and hypothyroidism. He initially presented to the [facility] ED on 03/28/24 for suicidal ideation. Pt does admit to reaching the planning stage, and considered taking a large amount of his at-home medications with the goal to overdose. He was subsequently placed under IVC at the ED. The patient sees Dr. Rudolpho at [facility], who helps manage his medications including Abilify  and Cymbalta . Mr. Luviano is compliant with taking his medications daily, but does voice concern about them not working.   Subjective:  Chart reviewed, case discussed in multidisciplinary meeting, patient seen during rounds.  On interview patient is noted to be walking in the hallway.  He reports he is feeling a lot better.  He met with the treatment team and reports that he was feeling lonely and had nobody talking to him or friends with him in the current living situation.  He reports that the ACT team caseworker is helping him to find a new placement.  He reports that once he finds a new place to live all his problems will be resolved.  He denies current SI/HI/plan.  Provider discussed optimizing the Abilify  dosage to 5 mg to help with better mood management.  Patient agreed with the plan.  He denies auditory/visual hallucinations.   Sleep: Fair  Appetite:  Fair  Past Psychiatric History: see h&P Family History:  Family History  Family history unknown: Yes   Social History:  Social History   Substance and Sexual Activity  Alcohol Use Not Currently   Comment: history of ETOH abuse - sober several months     Social History   Substance and Sexual Activity  Drug Use No    Social History   Socioeconomic History   Marital status: Divorced    Spouse name: Not on file   Number of children: Not on file   Years of education:  Not on file   Highest education level: Not on file  Occupational History   Not on file  Tobacco Use   Smoking status: Never   Smokeless tobacco: Current    Types: Chew  Vaping Use   Vaping status: Never Used  Substance and Sexual Activity   Alcohol use: Not Currently    Comment: history of ETOH abuse - sober several months   Drug use: No   Sexual activity: Not Currently    Birth control/protection: None  Other Topics Concern   Not on file  Social History Narrative   Not on file   Social Drivers of Health   Financial Resource Strain: Low Risk  (08/02/2023)   Received from [facility]   Overall Financial Resource Strain (CARDIA)    Difficulty of Paying Living Expenses: Not hard at all  Food Insecurity: Patient Declined (03/29/2024)   Hunger Vital Sign    Worried About Running Out of Food in the Last Year: Patient declined    Ran Out of Food in the Last Year: Patient declined  Transportation Needs: Patient Declined (03/29/2024)   PRAPARE - Administrator, Civil Service (Medical): Patient declined    Lack of Transportation (Non-Medical): Patient declined  Physical Activity: Not on file  Stress: Not on file  Social Connections: Moderately Integrated (03/29/2024)   Social Connection and Isolation Panel    Frequency of Communication with Friends and Family:  More than three times a week    Frequency of Social Gatherings with Friends and Family: More than three times a week    Attends Religious Services: More than 4 times per year    Active Member of Clubs or Organizations: Yes    Attends Engineer, structural: More than 4 times per year    Marital Status: Divorced   Past Medical History:  Past Medical History:  Diagnosis Date   Anxiety    Asthma    Borderline personality disorder (HCC)    Depression    Dysrhythmia    Erectile dysfunction    Fatty liver    GERD (gastroesophageal reflux disease)    Hypertension    Hypothyroidism     Pulmonary nodule, right    Restless leg    Sleep apnea    Suicide attempt [facility])     Past Surgical History:  Procedure Laterality Date   AMPUTATION ARM     COLONOSCOPY     COLONOSCOPY WITH PROPOFOL  N/A 08/22/2018   Procedure: COLONOSCOPY WITH PROPOFOL ;  Surgeon: Gaylyn Gladis PENNER, MD;  Location: [facility] ENDOSCOPY;  Service: Endoscopy;  Laterality: N/A;    Current Medications: Current Facility-Administered Medications  Medication Dose Route Frequency Provider Last Rate Last Admin   acetaminophen  (TYLENOL ) tablet 650 mg  650 mg Oral Q6H PRN Trudy Carwin, NP       alum & mag hydroxide-simeth (MAALOX/MYLANTA) 200-200-20 MG/5ML suspension 30 mL  30 mL Oral Q4H PRN Trudy Carwin, NP       ARIPiprazole  (ABILIFY ) tablet 2 mg  2 mg Oral Daily Ara Grandmaison, MD   2 mg at 03/31/24 0855   DULoxetine  (CYMBALTA ) DR capsule 60 mg  60 mg Oral Daily Tait Balistreri, MD   60 mg at 03/31/24 9145   finasteride  (PROSCAR ) tablet 5 mg  5 mg Oral Daily Windsor Zirkelbach, MD   5 mg at 03/31/24 9145   hydrOXYzine  (ATARAX ) tablet 25 mg  25 mg Oral TID PRN Jurline Pee L, NP       levothyroxine  (SYNTHROID ) tablet 75 mcg  75 mcg Oral QAC breakfast Daqwan Dougal, MD   75 mcg at 03/31/24 0525   magnesium  hydroxide (MILK OF MAGNESIA) suspension 30 mL  30 mL Oral Daily PRN Trudy Carwin, NP       metFORMIN  (GLUCOPHAGE ) tablet 1,000 mg  1,000 mg Oral BID WC Zachary Nole, MD   1,000 mg at 03/31/24 9145   OLANZapine  (ZYPREXA ) injection 5 mg  5 mg Intramuscular TID PRN Trudy Carwin, NP       OLANZapine  zydis (ZYPREXA ) disintegrating tablet 5 mg  5 mg Oral TID PRN Trudy Carwin, NP       pantoprazole  (PROTONIX ) EC tablet 40 mg  40 mg Oral Daily Katoria Yetman, MD   40 mg at 03/31/24 0855   rosuvastatin  (CRESTOR ) tablet 10 mg  10 mg Oral QHS Mikaele Stecher, MD   10 mg at 03/30/24 2106    Lab Results: No results found for this or any previous visit (from the past 48 hours).  Blood Alcohol level:  Lab Results   Component Value Date   [facility] <15 03/28/2024   ETH <10 04/22/2018    Metabolic Disorder Labs: Lab Results  Component Value Date   HGBA1C 5.2 02/28/2016   Lab Results  Component Value Date   PROLACTIN 6.2 02/28/2016   Lab Results  Component Value Date   CHOL 139 02/28/2016   TRIG 124 02/28/2016   HDL 43 02/28/2016  CHOLHDL 3.2 02/28/2016   VLDL 25 02/28/2016   LDLCALC 71 02/28/2016   LDLCALC 109 (H) 08/22/2015    Physical Findings: AIMS:  , ,  ,  ,    CIWA:    COWS:      Psychiatric Specialty Exam:  Presentation  General Appearance:  Appropriate for Environment; Casual  Eye Contact: Fair  Speech: Clear and Coherent  Speech Volume: Normal    Mood and Affect  Mood: Depressed; Dysphoric  Affect: Depressed; Flat   Thought Process  Thought Processes: Coherent  Descriptions of Associations:Intact  Orientation:Full (Time, Place and Person)  Thought Content:Illogical  Hallucinations:Hallucinations: None  Ideas of Reference:None  Suicidal Thoughts:Suicidal Thoughts: Yes, Passive  Homicidal Thoughts:Homicidal Thoughts: No   Sensorium  Memory: Immediate Fair; Recent Fair; Remote Poor  Judgment: Impaired  Insight: Shallow   Executive Functions  Concentration: Poor  Attention Span: Fair  Recall: [employer] of Knowledge: Fair  Language: Fair   Psychomotor Activity  Psychomotor Activity: Psychomotor Activity: Normal  Musculoskeletal: Strength & Muscle Tone: within normal limits Gait & Station: normal Assets  Assets: Manufacturing systems engineer; Desire for Improvement; Physical Health; Resilience    Physical Exam: Physical Exam ROS Blood pressure 126/80, pulse 69, temperature (!) 97.5 F (36.4 C), resp. rate 17, height 5' (1.524 m), weight 88.5 kg, SpO2 96%. Body mass index is 38.08 kg/m.  Diagnosis: Principal Problem:   MDD (major depressive disorder) Active Problems:   Major depressive disorder   Clinical  Decision Making: Patient with history of depression currently admitted after a lethal overdose on his home medication in the context of worsening depression, social isolation and not having any family support.  He will be monitored closely on the inpatient unit.   Treatment Plan Summary:   Safety and Monitoring:             -- Involuntary admission to inpatient psychiatric unit for safety, stabilization and treatment, will continue to assess for the need for IVF             -- Daily contact with patient to assess and evaluate symptoms and progress in treatment             -- Patient's case to be discussed in multi-disciplinary team meeting             -- Observation Level: q15 minute checks             -- Vital signs:  q12 hours             -- Precautions: suicide, elopement, and assault   2. Psychiatric Diagnoses and Treatment:                Increase Abilify  to 5 mg daily and Cymbalta  60 mg.  Will clarify if his home dosage is 120 mg.   -- The risks/benefits/side-effects/alternatives to this medication were discussed in detail with the patient and time was given for questions. The patient consents to medication trial.                -- Metabolic profile and EKG monitoring obtained while on an atypical antipsychotic (BMI: Lipid Panel: HbgA1c: QTc:)              -- Encouraged patient to participate in unit milieu and in scheduled group therapies                            3. Medical Issues Being Addressed:  No urgent medical needs noted 4. Discharge Planning:   -- Social work and case management to assist with discharge planning and identification of hospital follow-up needs prior to discharge  -- Estimated LOS: 3-4 days  Jules Baty, MD 03/31/2024, 1:49 PM

## 2024-03-31 NOTE — Group Note (Signed)
 Date:  03/31/2024 Time:  11:17 AM  Group Topic/Focus:  Community Meeting    Participation Level:  Did Not Attend  Mathew Aguilar 03/31/2024, 11:17 AM

## 2024-03-31 NOTE — Group Note (Signed)
 Date:  03/31/2024 Time:  9:01 PM  Group Topic/Focus:  Self Care:   The focus of this group is to help patients understand the importance of self-care in order to improve or restore emotional, physical, spiritual, interpersonal, and financial health.  MHT reviewed rules and expectations of the unit. MHT informed patients of 15 minute rounding. MHT informed doors are usually left slightly cracked to avoid hearing the clicking of the doors opening every 15 minutes. MHT informed patients not to be alarmed if they see someone looking in at night, it was only to check for their safety.  MHT group topic was wellness.  MHT explained while on the unit, patients have access to counselors and doctors. MHT encouraged continued treatment upon discharge. MHT informed of outpatient services that were available at [facility]. MHT informed patients of open access and how to get linked with outpatient services. MHT encouraged patients to continue taking medications as prescribed. MHT explained how some people will start doing well on medications and feel they no longer need, then will decompensate when stopping the medication.  MHT provided group with resources available in the community. MHT informed of the peer living room that is available to the community at [facility]. MHT informed they have access to the internet, daily groups, and a peer to assist with needs while there. MHT provided the address and telephone number. [address] [place] [phone]  Participation Level:  Active  Participation Quality:  Appropriate  Affect:  Appropriate  Cognitive:  Appropriate  Insight: Appropriate  Engagement in Group:  Engaged  Modes of Intervention:  Discussion  Additional Comments:    Francis JONETTA Boos 03/31/2024, 9:01 PM

## 2024-04-01 DIAGNOSIS — F332 Major depressive disorder, recurrent severe without psychotic features: Secondary | ICD-10-CM | POA: Diagnosis not present

## 2024-04-01 NOTE — Plan of Care (Signed)
  Problem: Education: Goal: Ability to state activities that reduce stress will improve Outcome: Progressing   Problem: Self-Concept: Goal: Ability to modify response to factors that promote anxiety will improve Outcome: Progressing   Problem: Education: Goal: Utilization of techniques to improve thought processes will improve Outcome: Progressing

## 2024-04-01 NOTE — Progress Notes (Signed)
 Mathew Aguilar Progress Note  04/01/2024 10:50 AM Mathew Aguilar  MRN:  [id number]  Mathew Aguilar is a 73 y/o M with a PMH significant for MDD, anxiety, Borderline Personality Disorder, SI, HTN, DM2, and hypothyroidism. He initially presented to the [facility] ED on 03/28/24 for suicidal ideation. Pt does admit to reaching the planning stage, and considered taking a large amount of his at-home medications with the goal to overdose. He was subsequently placed under IVC at the ED. The patient sees Dr. Rudolpho at [facility], who helps manage his medications including Abilify  and Cymbalta . Mathew Aguilar is compliant with taking his medications daily, but does voice concern about them not working.   Subjective:  Chart reviewed, case discussed in multidisciplinary meeting, patient is noted to be walking in the hallway.  He offers no complaints.  He is very pleasant and checks on the staff and other patients.  He reports that he is continues to feel better and denies having any SI/HI/plan.  He denies any auditory/visual hallucinations.  He is aware of the plan that his Abilify  has been titrated up to 5 mg for mood stabilization and an adjunct for antidepressant effect.  He denies having any side effects to these medications.  He is getting along with the peers and participating in groups on the unit.  He remains hopeful that he can work with the ACT team case manager to find assisted living facility where he can have interactions with other folks Sleep: Fair  Appetite:  Fair  Past Psychiatric History: see h&P Family History:  Family History  Family history unknown: Yes   Social History:  Social History   Substance and Sexual Activity  Alcohol Use Not Currently   Comment: history of ETOH abuse - sober several months     Social History   Substance and Sexual Activity  Drug Use No    Social History   Socioeconomic History   Marital status: Divorced    Spouse name: Not on file   Number of children:  Not on file   Years of education: Not on file   Highest education level: Not on file  Occupational History   Not on file  Tobacco Use   Smoking status: Never   Smokeless tobacco: Current    Types: Chew  Vaping Use   Vaping status: Never Used  Substance and Sexual Activity   Alcohol use: Not Currently    Comment: history of ETOH abuse - sober several months   Drug use: No   Sexual activity: Not Currently    Birth control/protection: None  Other Topics Concern   Not on file  Social History Narrative   Not on file   Social Drivers of Health   Financial Resource Strain: Low Risk  (08/02/2023)   Received from [facility]   Overall Financial Resource Strain (CARDIA)    Difficulty of Paying Living Expenses: Not hard at all  Food Insecurity: Patient Declined (03/29/2024)   Hunger Vital Sign    Worried About Running Out of Food in the Last Year: Patient declined    Ran Out of Food in the Last Year: Patient declined  Transportation Needs: Patient Declined (03/29/2024)   PRAPARE - Administrator, Civil Service (Medical): Patient declined    Lack of Transportation (Non-Medical): Patient declined  Physical Activity: Not on file  Stress: Not on file  Social Connections: Moderately Integrated (03/29/2024)   Social Connection and Isolation Panel    Frequency of Communication  with Friends and Family: More than three times a week    Frequency of Social Gatherings with Friends and Family: More than three times a week    Attends Religious Services: More than 4 times per year    Active Member of Clubs or Organizations: Yes    Attends Engineer, structural: More than 4 times per year    Marital Status: Divorced   Past Medical History:  Past Medical History:  Diagnosis Date   Anxiety    Asthma    Borderline personality disorder (HCC)    Depression    Dysrhythmia    Erectile dysfunction    Fatty liver    GERD (gastroesophageal reflux disease)     Hypertension    Hypothyroidism    Pulmonary nodule, right    Restless leg    Sleep apnea    Suicide attempt [facility])     Past Surgical History:  Procedure Laterality Date   AMPUTATION ARM     COLONOSCOPY     COLONOSCOPY WITH PROPOFOL  N/A 08/22/2018   Procedure: COLONOSCOPY WITH PROPOFOL ;  Surgeon: Gaylyn Gladis PENNER, Aguilar;  Location: [facility] ENDOSCOPY;  Service: Endoscopy;  Laterality: N/A;    Current Medications: Current Facility-Administered Medications  Medication Dose Route Frequency Provider Last Rate Last Admin   acetaminophen  (TYLENOL ) tablet 650 mg  650 mg Oral Q6H PRN Trudy Carwin, NP       alum & mag hydroxide-simeth (MAALOX/MYLANTA) 200-200-20 MG/5ML suspension 30 mL  30 mL Oral Q4H PRN Trudy Carwin, NP       ARIPiprazole  (ABILIFY ) tablet 5 mg  5 mg Oral Daily Keonta Alsip, Aguilar   5 mg at 04/01/24 0957   DULoxetine  (CYMBALTA ) DR capsule 120 mg  120 mg Oral Daily Kamon Fahr, Aguilar   120 mg at 04/01/24 0957   finasteride  (PROSCAR ) tablet 5 mg  5 mg Oral Daily Kaslyn Richburg, Aguilar   5 mg at 04/01/24 0957   hydrOXYzine  (ATARAX ) tablet 25 mg  25 mg Oral TID PRN Foust, Katy L, NP       levothyroxine  (SYNTHROID ) tablet 75 mcg  75 mcg Oral QAC breakfast Tyler Cubit, Aguilar   75 mcg at 04/01/24 9361   magnesium  hydroxide (MILK OF MAGNESIA) suspension 30 mL  30 mL Oral Daily PRN Trudy Carwin, NP       metFORMIN  (GLUCOPHAGE ) tablet 1,000 mg  1,000 mg Oral BID WC Brissia Delisa, Aguilar   1,000 mg at 04/01/24 9294   OLANZapine  (ZYPREXA ) injection 5 mg  5 mg Intramuscular TID PRN Trudy Carwin, NP       OLANZapine  zydis (ZYPREXA ) disintegrating tablet 5 mg  5 mg Oral TID PRN Trudy Carwin, NP       pantoprazole  (PROTONIX ) EC tablet 40 mg  40 mg Oral Daily Blaise Palladino, Aguilar   40 mg at 04/01/24 9294   rosuvastatin  (CRESTOR ) tablet 10 mg  10 mg Oral QHS Elissia Spiewak, Aguilar   10 mg at 03/31/24 2107    Lab Results: No results found for this or any previous visit (from the past 48 hours).  Blood  Alcohol level:  Lab Results  Component Value Date   [facility] <15 03/28/2024   ETH <10 04/22/2018    Metabolic Disorder Labs: Lab Results  Component Value Date   HGBA1C 5.2 02/28/2016   Lab Results  Component Value Date   PROLACTIN 6.2 02/28/2016   Lab Results  Component Value Date   CHOL 139 02/28/2016   TRIG 124 02/28/2016  HDL 43 02/28/2016   CHOLHDL 3.2 02/28/2016   VLDL 25 02/28/2016   LDLCALC 71 02/28/2016   LDLCALC 109 (H) 08/22/2015    Physical Findings: AIMS:  , ,  ,  ,    CIWA:    COWS:      Psychiatric Specialty Exam:  Presentation  General Appearance:  Appropriate for Environment; Casual  Eye Contact: Fair  Speech: Clear and Coherent  Speech Volume: Normal    Mood and Affect  Mood: Depressed; Dysphoric  Affect: Depressed; Flat   Thought Process  Thought Processes: Coherent  Descriptions of Associations:Intact  Orientation:Full (Time, Place and Person)  Thought Content:Illogical  Hallucinations: Denies  Ideas of Reference:None  Suicidal Thoughts: Denies  Homicidal Thoughts: Denies   Sensorium  Memory: Immediate Fair; Recent Fair; Remote Poor  Judgment: Impaired  Insight: Shallow   Executive Functions  Concentration: Poor  Attention Span: Fair  Recall: [employer] of Knowledge: Fair  Language: Fair   Psychomotor Activity  Psychomotor Activity: No data recorded  Musculoskeletal: Strength & Muscle Tone: within normal limits Gait & Station: normal Assets  Assets: Manufacturing systems engineer; Desire for Improvement; Physical Health; Resilience    Physical Exam: Physical Exam Vitals and nursing note reviewed.    ROS Blood pressure 132/82, pulse 80, temperature (!) 97.3 F (36.3 C), resp. rate 18, height 5' (1.524 m), weight 88.5 kg, SpO2 95%. Body mass index is 38.08 kg/m.  Diagnosis: Principal Problem:   MDD (major depressive disorder) Active Problems:   Major depressive disorder Recurrent  severe  Clinical Decision Making: Patient with history of depression currently admitted after a lethal overdose on his home medication in the context of worsening depression, social isolation and not having any family support.  He will be monitored closely on the inpatient unit.   Treatment Plan Summary:   Safety and Monitoring:             -- Involuntary admission to inpatient psychiatric unit for safety, stabilization and treatment, will continue to assess for the need for IVF             -- Daily contact with patient to assess and evaluate symptoms and progress in treatment             -- Patient's case to be discussed in multi-disciplinary team meeting             -- Observation Level: q15 minute checks             -- Vital signs:  q12 hours             -- Precautions: suicide, elopement, and assault   2. Psychiatric Diagnoses and Treatment:                Increase Abilify  to 5 mg daily and Cymbalta  60 mg.  Will clarify if his home dosage is 120 mg.   -- The risks/benefits/side-effects/alternatives to this medication were discussed in detail with the patient and time was given for questions. The patient consents to medication trial.                -- Metabolic profile and EKG monitoring obtained while on an atypical antipsychotic (BMI: Lipid Panel: HbgA1c: QTc:)              -- Encouraged patient to participate in unit milieu and in scheduled group therapies  3. Medical Issues Being Addressed:    No urgent medical needs noted 4. Discharge Planning:   -- Social work and case management to assist with discharge planning and identification of hospital follow-up needs prior to discharge  -- Estimated LOS: 3-4 days  Allyn Foil, Aguilar 04/01/2024, 10:50 AM

## 2024-04-01 NOTE — Group Note (Signed)
 Date:  04/01/2024 Time:  9:44 PM  Group Topic/Focus:  Developing a Wellness Toolbox:   The focus of this group is to help patients develop a wellness toolbox with skills and strategies to promote recovery upon discharge. Identifying Needs:   The focus of this group is to help patients identify their personal needs that have been historically problematic and identify healthy behaviors to address their needs.  MHT started group with an ice breaker, telling 2 true statements and 1 false, and the group trying to figure out which was false. Group enjoyed ice breaker, with 100% participation. MHT opened group with any concerns they wanted addressed. Group chose to talk about what to do after discharge. MHT recapped some things from the previous night, about linkage to mental health services and continued treatment with physicians and therapist.  MHT addressed concerns about finding resources available in the community to address needs. MHT informed of 988 and 211 phone services. MHT informed how to find available resources, such as housing, food, utility assistance, etc by calling 211. MHT explained what to expect when calling. MHT called to model how to find resources available. MHT asked what resources they wanted to look for. Group chose food stamps/food. MHT called 211, put on speaker for the group to hear the call, and requested information on how to get food stamps and available food in the community. MHT answered the questions, and  showed group how to follow the prompts. MHT was given these resources: Food and nutrition services [phone] [facility] [phone] Friday 10-11am [address] [place], [place] [address] [employer] [phone] Wednesday 9-10am [address], [place] [address] In addition, MHT informed by calling 211, they can link to other services, such as AA meetings available in the community. MHT informed of additional ways  to find resources in the community by going on-line to [employer]. MHT informed they could complete the request on-line by providing name, telephone number, and email address. MHT informed they had categories they could choose from and make a note of what they were specifically looking for assistance with.  MHT processed with group about dealing with stressors, by focusing on the solution as opposed to the problem. MHT provided supportive counseling to address changing the way they think, to change the way they react to situations. MHT informed they could not change the things that happen, but they can change the way they look at the situation, to change the way they respond to it. MHT gave an example of a half full/empty cup. MHT explained looking at it half full had a different feeling than looking at it half empty. MHT explained how focusing on it in a negative way determined how they felt about it. Group was receptive and stated they would start focusing on solutions instead of focusing in on the problem. MHT addressed specific concerns of what to do when feeling like relapsing. MHT encouraged group to find out the triggers, warning signs, and developing coping skills. MHT explained making a list of the people that help keep them calm and help them work through difficult situations would be helpful to have, so they can easily access them in need. MHT suggested finding a sponsor as well, to help work through the difficult times. MHT encouraged group to do what was needed and not what was convenient or easy. MHT encouraged group to consult with their care teams to develop a crisis plan to follow upon discharge, so  they know what they need to do to remain safe and healthy upon discharge.  Participation Level:  Active  Participation Quality:  Appropriate  Affect:  Appropriate  Cognitive:  Appropriate  Insight: Appropriate  Engagement in Group:  Engaged  Modes of Intervention:   Discussion  Additional Comments:  Mathew Aguilar left group after completing call with 211. Participated while in attendance at group.  Mathew Aguilar 04/01/2024, 9:44 PM

## 2024-04-01 NOTE — [facility] Suicide Risk Assessment (Signed)
 [facility] INPATIENT:  Family/Significant Other Suicide Prevention Education  Suicide Prevention Education:  Education Completed; Romero, team lead [facility] , [phone],  (name of family member/significant other) has been identified by the patient as the family member/significant other with whom the patient will be residing, and identified as the person(s) who will aid the patient in the event of a mental health crisis (suicidal ideations/suicide attempt).  With written consent from the patient, the family member/significant other has been provided the following suicide prevention education, prior to the and/or following the discharge of the patient.  The suicide prevention education provided includes the following: Suicide risk factors Suicide prevention and interventions National Suicide Hotline telephone number [facility] assessment telephone number [facility] Emergency Assistance 911 [facility] and/or Residential Mobile Crisis Unit telephone number  Request made of family/significant other to: Remove weapons (e.g., guns, rifles, knives), all items previously/currently identified as safety concern.   Remove drugs/medications (over-the-counter, prescriptions, illicit drugs), all items previously/currently identified as a safety concern.  The family member/significant other verbalizes understanding of the suicide prevention education information provided.  The family member/significant other agrees to remove the items of safety concern listed above.  Mathew Aguilar 04/01/2024, 3:05 PM

## 2024-04-01 NOTE — Progress Notes (Signed)
   04/01/24 1100  Psych Admission Type (Psych Patients Only)  Admission Status Involuntary  Psychosocial Assessment  Patient Complaints Depression  Eye Contact Brief  Facial Expression Animated  Affect Appropriate to circumstance  Speech Logical/coherent  Interaction Assertive  Motor Activity Tremors  Appearance/Hygiene In scrubs  Behavior Characteristics Appropriate to situation  Mood Depressed  Thought Process  Coherency WDL  Content WDL  Delusions None reported or observed  Perception WDL  Hallucination None reported or observed  Judgment WDL  Confusion None  Danger to Self  Current suicidal ideation? Denies  Agreement Not to Harm Self Yes  Description of Agreement verbal  Danger to Others  Danger to Others None reported or observed

## 2024-04-01 NOTE — Plan of Care (Signed)
   Problem: Activity: Goal: Interest or engagement in leisure activities will improve Outcome: Progressing Goal: Imbalance in normal sleep/wake cycle will improve Outcome: Progressing

## 2024-04-01 NOTE — Group Note (Signed)
 Date:  04/01/2024 Time:  11:15 AM  Group Topic/Focus:  Outside Rec/Music Therapy  The purpose of this group is to allow patients to go out and enjoy the outdoors while participating in outside activities interacting with peers and listening to their favorite tunes.    Participation Level:  Active  Participation Quality:  Appropriate  Affect:  Appropriate  Cognitive:  Appropriate  Insight: Appropriate  Engagement in Group:  Engaged  Modes of Intervention:  Activity  Additional Comments:    Beatris ONEIDA Hasten 04/01/2024, 11:15 AM

## 2024-04-02 DIAGNOSIS — F332 Major depressive disorder, recurrent severe without psychotic features: Secondary | ICD-10-CM | POA: Diagnosis not present

## 2024-04-02 NOTE — Group Note (Signed)
 Date:  04/02/2024 Time:  11:27 AM  Group Topic/Focus:  Outside Rec/Music Therapy The purpose of this group is for patients to get out and get fresh air while doing outside activities and socializing with other peers.     Participation Level:  Active  Participation Quality:  Appropriate  Affect:  Appropriate  Cognitive:  Appropriate  Insight: Appropriate  Engagement in Group:  Engaged  Modes of Intervention:  Activity  Additional Comments:    Beatris ONEIDA Hasten 04/02/2024, 11:27 AM

## 2024-04-02 NOTE — Progress Notes (Signed)
 [facility] MD Progress Note  04/02/2024 11:25 PM Mathew Aguilar  MRN:  [id number]  Mathew Aguilar is a 73 y/o M with a PMH significant for MDD, anxiety, Borderline Personality Disorder, SI, HTN, DM2, and hypothyroidism. He initially presented to the [facility] ED on 03/28/24 for suicidal ideation. Pt does admit to reaching the planning stage, and considered taking a large amount of his at-home medications with the goal to overdose. He was subsequently placed under IVC at the ED. The patient sees Dr. Rudolpho at [facility], who helps manage his medications including Abilify  and Cymbalta . Mathew Aguilar is compliant with taking his medications daily, but does voice concern about them not working.   Subjective:  Chart reviewed, case discussed in multidisciplinary meeting, patient is noted to be walking in the hallway.  On review patient is noted to be resting in bed.  He offers no complaints.  He is excited to be going back to the home on Monday.  He denies SI/HI/plan and denies hallucinations.  He has fair appetite and sleep.  Per nursing he is engaging in groups and has no behavioral problems   Sleep: Fair  Appetite:  Fair  Past Psychiatric History: see h&P Family History:  Family History  Family history unknown: Yes   Social History:  Social History   Substance and Sexual Activity  Alcohol Use Not Currently   Comment: history of ETOH abuse - sober several months     Social History   Substance and Sexual Activity  Drug Use No    Social History   Socioeconomic History   Marital status: Divorced    Spouse name: Not on file   Number of children: Not on file   Years of education: Not on file   Highest education level: Not on file  Occupational History   Not on file  Tobacco Use   Smoking status: Never   Smokeless tobacco: Current    Types: Chew  Vaping Use   Vaping status: Never Used  Substance and Sexual Activity   Alcohol use: Not Currently    Comment: history of ETOH abuse -  sober several months   Drug use: No   Sexual activity: Not Currently    Birth control/protection: None  Other Topics Concern   Not on file  Social History Narrative   Not on file   Social Drivers of Health   Financial Resource Strain: Low Risk  (08/02/2023)   Received from [facility]   Overall Financial Resource Strain (CARDIA)    Difficulty of Paying Living Expenses: Not hard at all  Food Insecurity: Patient Declined (03/29/2024)   Hunger Vital Sign    Worried About Running Out of Food in the Last Year: Patient declined    Ran Out of Food in the Last Year: Patient declined  Transportation Needs: Patient Declined (03/29/2024)   PRAPARE - Administrator, Civil Service (Medical): Patient declined    Lack of Transportation (Non-Medical): Patient declined  Physical Activity: Not on file  Stress: Not on file  Social Connections: Moderately Integrated (03/29/2024)   Social Connection and Isolation Panel    Frequency of Communication with Friends and Family: More than three times a week    Frequency of Social Gatherings with Friends and Family: More than three times a week    Attends Religious Services: More than 4 times per year    Active Member of [employer] or Organizations: Yes    Attends Banker Meetings: More than  4 times per year    Marital Status: Divorced   Past Medical History:  Past Medical History:  Diagnosis Date   Anxiety    Asthma    Borderline personality disorder (HCC)    Depression    Dysrhythmia    Erectile dysfunction    Fatty liver    GERD (gastroesophageal reflux disease)    Hypertension    Hypothyroidism    Pulmonary nodule, right    Restless leg    Sleep apnea    Suicide attempt [facility])     Past Surgical History:  Procedure Laterality Date   AMPUTATION ARM     COLONOSCOPY     COLONOSCOPY WITH PROPOFOL  N/A 08/22/2018   Procedure: COLONOSCOPY WITH PROPOFOL ;  Surgeon: Gaylyn Gladis PENNER, MD;  Location: [facility]  ENDOSCOPY;  Service: Endoscopy;  Laterality: N/A;    Current Medications: Current Facility-Administered Medications  Medication Dose Route Frequency Provider Last Rate Last Admin   acetaminophen  (TYLENOL ) tablet 650 mg  650 mg Oral Q6H PRN Trudy Carwin, NP       alum & mag hydroxide-simeth (MAALOX/MYLANTA) 200-200-20 MG/5ML suspension 30 mL  30 mL Oral Q4H PRN Trudy Carwin, NP       ARIPiprazole  (ABILIFY ) tablet 5 mg  5 mg Oral Daily Libi Corso, MD   5 mg at 04/02/24 0740   DULoxetine  (CYMBALTA ) DR capsule 120 mg  120 mg Oral Daily Nataliee Shurtz, MD   120 mg at 04/02/24 0741   finasteride  (PROSCAR ) tablet 5 mg  5 mg Oral Daily Catalea Labrecque, MD   5 mg at 04/02/24 0740   hydrOXYzine  (ATARAX ) tablet 25 mg  25 mg Oral TID PRN Foust, Katy L, NP       levothyroxine  (SYNTHROID ) tablet 75 mcg  75 mcg Oral QAC breakfast Eiley Mcginnity, MD   75 mcg at 04/02/24 0613   magnesium  hydroxide (MILK OF MAGNESIA) suspension 30 mL  30 mL Oral Daily PRN Trudy Carwin, NP       metFORMIN  (GLUCOPHAGE ) tablet 1,000 mg  1,000 mg Oral BID WC Menna Abeln, MD   1,000 mg at 04/02/24 1638   OLANZapine  (ZYPREXA ) injection 5 mg  5 mg Intramuscular TID PRN Trudy Carwin, NP       OLANZapine  zydis (ZYPREXA ) disintegrating tablet 5 mg  5 mg Oral TID PRN Trudy Carwin, NP       pantoprazole  (PROTONIX ) EC tablet 40 mg  40 mg Oral Daily Asuka Dusseau, MD   40 mg at 04/02/24 0741   rosuvastatin  (CRESTOR ) tablet 10 mg  10 mg Oral QHS Jabriel Vanduyne, MD   10 mg at 04/02/24 2141    Lab Results: No results found for this or any previous visit (from the past 48 hours).  Blood Alcohol level:  Lab Results  Component Value Date   [facility] <15 03/28/2024   ETH <10 04/22/2018    Metabolic Disorder Labs: Lab Results  Component Value Date   HGBA1C 5.2 02/28/2016   Lab Results  Component Value Date   PROLACTIN 6.2 02/28/2016   Lab Results  Component Value Date   CHOL 139 02/28/2016   TRIG 124 02/28/2016   HDL  43 02/28/2016   CHOLHDL 3.2 02/28/2016   VLDL 25 02/28/2016   LDLCALC 71 02/28/2016   LDLCALC 109 (H) 08/22/2015    Physical Findings: AIMS:  , ,  ,  ,    CIWA:    COWS:      Psychiatric Specialty Exam:  Presentation  General Appearance:  Appropriate for Environment; Casual  Eye Contact: Fair  Speech: Clear and Coherent  Speech Volume: Normal    Mood and Affect  Mood: Euthymic  Affect: Appropriate   Thought Process  Thought Processes: Coherent  Descriptions of Associations:Intact  Orientation:Full (Time, Place and Person)  Thought Content:Logical  Hallucinations: Denies  Ideas of Reference:None  Suicidal Thoughts: Denies  Homicidal Thoughts: Denies   Sensorium  Memory: Immediate Fair; Recent Fair; Remote Fair  Judgment: Fair  Insight: Fair   Art therapist  Concentration: Fair  Attention Span: Fair  Recall: [employer] of Knowledge: Fair  Language: Fair   Psychomotor Activity  Psychomotor Activity: Psychomotor Activity: Normal   Musculoskeletal: Strength & Muscle Tone: within normal limits Gait & Station: normal Assets  Assets: Manufacturing systems engineer; Desire for Improvement; Resilience; Social Support    Physical Exam: Physical Exam Vitals and nursing note reviewed.    ROS Blood pressure 115/73, pulse 84, temperature (!) 97.5 F (36.4 C), resp. rate 14, height 5' (1.524 m), weight 88.5 kg, SpO2 96%. Body mass index is 38.08 kg/m.  Diagnosis: Principal Problem:   MDD (major depressive disorder) Active Problems:   Major depressive disorder Recurrent severe  Clinical Decision Making: Patient with history of depression currently admitted after a lethal overdose on his home medication in the context of worsening depression, social isolation and not having any family support.  He will be monitored closely on the inpatient unit.   Treatment Plan Summary:   Safety and Monitoring:             --  Involuntary admission to inpatient psychiatric unit for safety, stabilization and treatment, will continue to assess for the need for IVF             -- Daily contact with patient to assess and evaluate symptoms and progress in treatment             -- Patient's case to be discussed in multi-disciplinary team meeting             -- Observation Level: q15 minute checks             -- Vital signs:  q12 hours             -- Precautions: suicide, elopement, and assault   2. Psychiatric Diagnoses and Treatment:                Abilify  to 5 mg daily and Cymbalta   120 mg.   -- The risks/benefits/side-effects/alternatives to this medication were discussed in detail with the patient and time was given for questions. The patient consents to medication trial.                -- Metabolic profile and EKG monitoring obtained while on an atypical antipsychotic (BMI: Lipid Panel: HbgA1c: QTc:)              -- Encouraged patient to participate in unit milieu and in scheduled group therapies                            3. Medical Issues Being Addressed:    No urgent medical needs noted 4. Discharge Planning:   -- Social work and case management to assist with discharge planning and identification of hospital follow-up needs prior to discharge  -- Estimated LOS: 3-4 days  Allyn Foil, MD 04/02/2024, 11:25 PM

## 2024-04-02 NOTE — Progress Notes (Signed)
   04/02/24 0710  Psych Admission Type (Psych Patients Only)  Admission Status Involuntary  Psychosocial Assessment  Patient Complaints None  Eye Contact Fair  Facial Expression Animated  Affect Appropriate to circumstance  Speech Logical/coherent  Interaction Assertive  Motor Activity Slow  Appearance/Hygiene Unremarkable  Behavior Characteristics Cooperative  Mood Pleasant  Thought Process  Coherency WDL  Content WDL  Delusions None reported or observed  Perception WDL  Hallucination None reported or observed  Judgment WDL  Confusion None  Danger to Self  Current suicidal ideation? Denies

## 2024-04-02 NOTE — Progress Notes (Signed)
   04/02/24 2200  Psych Admission Type (Psych Patients Only)  Admission Status Involuntary  Psychosocial Assessment  Patient Complaints None  Eye Contact Fair  Facial Expression Animated  Affect Appropriate to circumstance  Speech Logical/coherent  Interaction Assertive  Motor Activity Slow  Appearance/Hygiene In scrubs  Behavior Characteristics Cooperative  Mood Pleasant  Thought Process  Coherency WDL  Content WDL  Delusions None reported or observed  Perception WDL  Hallucination None reported or observed  Judgment WDL  Confusion None  Danger to Self  Current suicidal ideation? Denies  Agreement Not to Harm Self No  Description of Agreement VERBAL

## 2024-04-02 NOTE — Group Note (Signed)
 Date:  04/02/2024 Time:  9:30 PM  Group Topic/Focus:  Wrap-Up Group:   The focus of this group is to help patients review their daily goal of treatment and discuss progress on daily workbooks.    Participation Level:  Active  Participation Quality:  Appropriate  Affect:  Appropriate  Cognitive:  Alert  Insight: Appropriate  Engagement in Group:  Engaged  Modes of Intervention:  Discussion  Additional Comments:    Tommas CHRISTELLA Bunker 04/02/2024, 9:30 PM

## 2024-04-02 NOTE — Plan of Care (Signed)
  Problem: Education: Goal: Ability to state activities that reduce stress will improve Outcome: Progressing   Problem: Coping: Goal: Ability to identify and develop effective coping behavior will improve Outcome: Progressing   Problem: Activity: Goal: Interest or engagement in leisure activities will improve Outcome: Progressing   Problem: Coping: Goal: Coping ability will improve Outcome: Progressing   Problem: Safety: Goal: Ability to disclose and discuss suicidal ideas will improve Outcome: Progressing

## 2024-04-02 NOTE — Plan of Care (Signed)
  Problem: Coping: Goal: Ability to identify and develop effective coping behavior will improve Outcome: Progressing   Problem: Self-Concept: Goal: Ability to identify factors that promote anxiety will improve Outcome: Progressing Goal: Level of anxiety will decrease Outcome: Progressing   

## 2024-04-02 NOTE — Progress Notes (Signed)
   04/02/24 0457  Psych Admission Type (Psych Patients Only)  Admission Status Involuntary  Psychosocial Assessment  Patient Complaints Depression  Eye Contact Brief  Facial Expression Animated  Affect Appropriate to circumstance  Speech Logical/coherent  Interaction Assertive  Motor Activity Tremors  Appearance/Hygiene In scrubs  Behavior Characteristics Appropriate to situation  Mood Depressed  Thought Process  Coherency WDL  Content WDL  Delusions None reported or observed  Perception WDL  Hallucination None reported or observed  Judgment WDL  Confusion None  Danger to Self  Current suicidal ideation? Denies  Agreement Not to Harm Self Yes  Description of Agreement Verbal  Danger to Others  Danger to Others None reported or observed

## 2024-04-03 DIAGNOSIS — F332 Major depressive disorder, recurrent severe without psychotic features: Secondary | ICD-10-CM | POA: Diagnosis not present

## 2024-04-03 MED ORDER — FINASTERIDE 5 MG PO TABS: 5.0000 mg | ORAL_TABLET | Freq: Every day | ORAL | 0 refills | Status: AC

## 2024-04-03 MED ORDER — ARIPIPRAZOLE 5 MG PO TABS: 5.0000 mg | ORAL_TABLET | Freq: Every day | ORAL | 0 refills | Status: AC

## 2024-04-03 MED ORDER — METFORMIN HCL 1000 MG PO TABS: 1000.0000 mg | ORAL_TABLET | Freq: Two times a day (BID) | ORAL | 0 refills | Status: AC

## 2024-04-03 MED ORDER — DULOXETINE HCL 60 MG PO CPEP: 120.0000 mg | ORAL_CAPSULE | Freq: Every day | ORAL | 0 refills | Status: AC

## 2024-04-03 MED ORDER — LEVOTHYROXINE SODIUM 75 MCG PO TABS: 75.0000 ug | ORAL_TABLET | Freq: Every day | ORAL | 0 refills | Status: AC

## 2024-04-03 MED ORDER — PANTOPRAZOLE SODIUM 40 MG PO TBEC: 40.0000 mg | DELAYED_RELEASE_TABLET | Freq: Every day | ORAL | 0 refills | Status: AC

## 2024-04-03 NOTE — Plan of Care (Signed)
 Patient is scheduled for discharge today. Patient will be picked up by the act team.  Will continue to monitor until discharged.

## 2024-04-03 NOTE — BHH Suicide Risk Assessment (Signed)
 [facility] Discharge Suicide Risk Assessment   Principal Problem: MDD (major depressive disorder) Discharge Diagnoses: Principal Problem:   MDD (major depressive disorder) Active Problems:   Major depressive disorder   Total Time spent with patient: 30 minutes  Musculoskeletal: Strength & Muscle Tone: within normal limits Gait & Station: normal Patient leans: N/A  Psychiatric Specialty Exam  Presentation  General Appearance:  Appropriate for Environment; Casual  Eye Contact: Fair  Speech: Clear and Coherent  Speech Volume: Normal  Handedness: Right   Mood and Affect  Mood: Euthymic  Duration of Depression Symptoms: No data recorded Affect: Appropriate   Thought Process  Thought Processes: Coherent  Descriptions of Associations:Intact  Orientation:Full (Time, Place and Person)  Thought Content:Logical  History of Schizophrenia/Schizoaffective disorder:No data recorded Duration of Psychotic Symptoms:No data recorded Hallucinations:Hallucinations: None  Ideas of Reference:None  Suicidal Thoughts:Suicidal Thoughts: No  Homicidal Thoughts:Homicidal Thoughts: No   Sensorium  Memory: Immediate Fair; Recent Fair; Remote Fair  Judgment: Fair  Insight: Fair   Art therapist  Concentration: Fair  Attention Span: Fair  Recall: [employer] of Knowledge: Fair  Language: Fair   Psychomotor Activity  Psychomotor Activity: Psychomotor Activity: Normal   Assets  Assets: Communication Skills; Desire for Improvement; Resilience; Social Support   Sleep  Sleep: Sleep: Fair  Estimated Sleeping Duration (Last 24 Hours): 4.50-5.50 hours  Physical Exam: Physical Exam ROS Blood pressure 125/77, pulse 79, temperature (!) 97.5 F (36.4 C), resp. rate 17, height 5' (1.524 m), weight 88.5 kg, SpO2 97%. Body mass index is 38.08 kg/m.  Mental Status Per Nursing Assessment::   On Admission:  NA  Demographic Factors:  Male  Loss  Factors: Decrease in vocational status  Historical Factors: NA  Risk Reduction Factors:   Positive social support, Positive therapeutic relationship, and Positive coping skills or problem solving skills  Continued Clinical Symptoms:  Depression:   Insomnia  Cognitive Features That Contribute To Risk:  None    Suicide Risk:  Minimal: No identifiable suicidal ideation.  Patients presenting with no risk factors but with morbid ruminations; may be classified as minimal risk based on the severity of the depressive symptoms    Plan Of Care/Follow-up recommendations:  Activity:  As tolerated  Allyn Foil, MD 04/03/2024, 8:52 AM

## 2024-04-03 NOTE — BHH Counselor (Signed)
 CSW received return call from Aiea at [facility].  Romero reports that she will be out to see pt tomorrow.   CSW informed pt.   Lum Croft, MSW, [place] 04/03/2024 1:44 PM

## 2024-04-03 NOTE — Discharge Summary (Signed)
 Physician Discharge Summary Note  Patient:  Mathew Aguilar is an 73 y.o., male MRN:  [id number] DOB:  08/26/1951 Patient phone:  [phone] (home)  Patient address:   [address] [place] [address],    Date of Admission:  03/29/2024 Date of Discharge: 04/03/24  Reason for Admission:  Mathew Aguilar is a 28 y/o M with a PMH significant for MDD, anxiety, Borderline Personality Disorder, SI, HTN, DM2, and hypothyroidism. He initially presented to the [facility] ED on 03/28/24 for suicidal ideation. Pt does admit to reaching the planning stage, and considered taking a large amount of his at-home medications with the goal to overdose. He was subsequently placed under IVC at the ED. The patient sees Dr. Rudolpho at [facility], who helps manage his medications including Abilify  and Cymbalta . Mathew Aguilar is compliant with taking his medications daily, but does voice concern about them not working.   Principal Problem: MDD (major depressive disorder) Discharge Diagnoses: Principal Problem:   MDD (major depressive disorder) Active Problems:   Major depressive disorder   Past Psychiatric History: see h&p  Family Psychiatric  History: see h&p Social History:  Social History   Substance and Sexual Activity  Alcohol Use Not Currently   Comment: history of ETOH abuse - sober several months     Social History   Substance and Sexual Activity  Drug Use No    Social History   Socioeconomic History   Marital status: Divorced    Spouse name: Not on file   Number of children: Not on file   Years of education: Not on file   Highest education level: Not on file  Occupational History   Not on file  Tobacco Use   Smoking status: Never   Smokeless tobacco: Current    Types: Chew  Vaping Use   Vaping status: Never Used  Substance and Sexual Activity   Alcohol use: Not Currently    Comment: history of ETOH abuse - sober several months   Drug use: No   Sexual activity: Not  Currently    Birth control/protection: None  Other Topics Concern   Not on file  Social History Narrative   Not on file   Social Drivers of Health   Financial Resource Strain: Low Risk  (08/02/2023)   Received from [facility]   Overall Financial Resource Strain (CARDIA)    Difficulty of Paying Living Expenses: Not hard at all  Food Insecurity: Patient Declined (03/29/2024)   Hunger Vital Sign    Worried About Running Out of Food in the Last Year: Patient declined    Ran Out of Food in the Last Year: Patient declined  Transportation Needs: Patient Declined (03/29/2024)   PRAPARE - Administrator, Civil Service (Medical): Patient declined    Lack of Transportation (Non-Medical): Patient declined  Physical Activity: Not on file  Stress: Not on file  Social Connections: Moderately Integrated (03/29/2024)   Social Connection and Isolation Panel    Frequency of Communication with Friends and Family: More than three times a week    Frequency of Social Gatherings with Friends and Family: More than three times a week    Attends Religious Services: More than 4 times per year    Active Member of [employer] or Organizations: Yes    Attends Engineer, structural: More than 4 times per year    Marital Status: Divorced   Past Medical History:  Past Medical History:  Diagnosis Date  Anxiety    Asthma    Borderline personality disorder (HCC)    Depression    Dysrhythmia    Erectile dysfunction    Fatty liver    GERD (gastroesophageal reflux disease)    Hypertension    Hypothyroidism    Pulmonary nodule, right    Restless leg    Sleep apnea    Suicide attempt [facility])     Past Surgical History:  Procedure Laterality Date   AMPUTATION ARM     COLONOSCOPY     COLONOSCOPY WITH PROPOFOL  N/A 08/22/2018   Procedure: COLONOSCOPY WITH PROPOFOL ;  Surgeon: Gaylyn Gladis PENNER, MD;  Location: [facility] ENDOSCOPY;  Service: Endoscopy;  Laterality: N/A;   Family  History:  Family History  Family history unknown: Yes    Hospital Course:  Mathew Aguilar is a 73 y/o M with a PMH significant for MDD, anxiety, Borderline Personality Disorder, SI, HTN, DM2, and hypothyroidism. He initially presented to the [facility] ED on 03/28/24 for suicidal ideation. Pt does admit to reaching the planning stage, and considered taking a large amount of his at-home medications with the goal to overdose. He was subsequently placed under IVC at the ED. The patient sees Dr. Rudolpho at [facility], who helps manage his medications including Abilify  and Cymbalta . Mathew Aguilar is compliant with taking his medications daily, but does voice concern about them not working.  Detailed risk assessment is complete based on clinical exam and individual risk factors and acute suicide risk is low and acute violence risk is low.   On admission patient was restarted on his home medication Abilify  2 mg which was titrated up to 5 mg for further mood stabilization and Cymbalta  was very treated with 20 mg daily.  Patient tolerated medications with no reported side effect.  He displayed safe behaviors on the unit.  Patient displayed significant improvement in his mood and depression.  On the day of discharge he consistently denied SI/HI/plan and denied hallucinations.  His ACT team was reached out and his home medications were confirmed.  Active was informed about the discharge of the patient.  Currently, all modifiable risk of harm to self/harm to others have been addressed and patient is no longer appropriate for the acute inpatient setting and is able to continue treatment for mental health needs in the community with the supports as indicated below.  Patient is educated and verbalized understanding of discharge plan of care including medications, follow-up appointments, mental health resources and further crisis services in the community.  He is instructed to call 911 or present to the nearest emergency  room should he experience any decompensation in mood, disturbance of bowel or return of suicidal/homicidal ideations.  Patient verbalizes understanding of this education and agrees to this plan of care  Physical Findings: AIMS:  , ,  ,  ,    CIWA:    COWS:        Psychiatric Specialty Exam:  Presentation  General Appearance:  Appropriate for Environment; Casual  Eye Contact: Fair  Speech: Clear and Coherent  Speech Volume: Normal    Mood and Affect  Mood: Euthymic  Affect: Appropriate   Thought Process  Thought Processes: Coherent  Descriptions of Associations:Intact  Orientation:Full (Time, Place and Person)  Thought Content:Logical  Hallucinations:Hallucinations: None  Ideas of Reference:None  Suicidal Thoughts:Suicidal Thoughts: No  Homicidal Thoughts:Homicidal Thoughts: No   Sensorium  Memory: Immediate Fair; Recent Fair; Remote Fair  Judgment: Fair  Insight: Fair   Art therapist  Concentration: Fair  Attention Span: Fair  Recall: [employer] of Knowledge: Fair  Language: Fair   Psychomotor Activity  Psychomotor Activity: Psychomotor Activity: Normal  Musculoskeletal: Strength & Muscle Tone: within normal limits Gait & Station: normal Assets  Assets: Manufacturing systems engineer; Desire for Improvement; Resilience; Social Support   Sleep  Sleep: Sleep: Fair    Physical Exam: Physical Exam Vitals and nursing note reviewed.    ROS Blood pressure 125/77, pulse 79, temperature (!) 97.5 F (36.4 C), resp. rate 17, height 5' (1.524 m), weight 88.5 kg, SpO2 97%. Body mass index is 38.08 kg/m.   Social History   Tobacco Use  Smoking Status Never  Smokeless Tobacco Current   Types: Chew   Tobacco Cessation:  N/A, patient does not currently use tobacco products   Blood Alcohol level:  Lab Results  Component Value Date   [employer] <15 03/28/2024   ETH <10 04/22/2018    Metabolic Disorder Labs:  Lab  Results  Component Value Date   HGBA1C 5.2 02/28/2016   Lab Results  Component Value Date   PROLACTIN 6.2 02/28/2016   Lab Results  Component Value Date   CHOL 139 02/28/2016   TRIG 124 02/28/2016   HDL 43 02/28/2016   CHOLHDL 3.2 02/28/2016   VLDL 25 02/28/2016   LDLCALC 71 02/28/2016   LDLCALC 109 (H) 08/22/2015    See Psychiatric Specialty Exam and Suicide Risk Assessment completed by Attending Physician prior to discharge.  Discharge destination:  Home  Is patient on multiple antipsychotic therapies at discharge:  No   Has Patient had three or more failed trials of antipsychotic monotherapy by history:  No  Recommended Plan for Multiple Antipsychotic Therapies: NA  Discharge Instructions     Diet - low sodium heart healthy   Complete by: As directed    Increase activity slowly   Complete by: As directed       Allergies as of 04/03/2024   No Known Allergies      Medication List     STOP taking these medications    omeprazole 20 MG capsule Commonly known as: PRILOSEC Replaced by: pantoprazole  40 MG tablet       TAKE these medications      Indication  ARIPiprazole  5 MG tablet Commonly known as: ABILIFY  Take 1 tablet (5 mg total) by mouth daily. What changed:  medication strength how much to take    DULoxetine  60 MG capsule Commonly known as: CYMBALTA  Take 2 capsules (120 mg total) by mouth daily. What changed: how much to take    finasteride  5 MG tablet Commonly known as: PROSCAR  Take 1 tablet (5 mg total) by mouth daily.    levothyroxine  75 MCG tablet Commonly known as: SYNTHROID  Take 1 tablet (75 mcg total) by mouth daily before breakfast.  Indication: Underactive Thyroid   metFORMIN  1000 MG tablet Commonly known as: GLUCOPHAGE  Take 1 tablet (1,000 mg total) by mouth 2 (two) times daily with a meal. What changed: when to take this    omega-3 acid ethyl esters 1 g capsule Commonly known as: LOVAZA Take 1 g by mouth daily.     pantoprazole  40 MG tablet Commonly known as: PROTONIX  Take 1 tablet (40 mg total) by mouth daily. Replaces: omeprazole 20 MG capsule    rosuvastatin  10 MG tablet Commonly known as: CRESTOR  Take 10 mg by mouth at bedtime.          Follow-up recommendations:  Activity:  As tolerated    Signed: Almarie Kurdziel, MD  04/03/2024, 8:52 AM

## 2024-04-03 NOTE — Care Management Important Message (Signed)
 Important Message  Patient Details  Name: Mathew Aguilar MRN: [id number] Date of Birth: 07/28/1951   Important Message Given:  Yes - Medicare IM     Lum [name], LCSWA 04/03/2024, 1:41 PM

## 2024-04-03 NOTE — Plan of Care (Signed)
 Patient is expected to discharge home today.  States his ACT team is going to pick him up.  SW will verify. Calmly awaiting discharge in the day room.

## 2024-04-03 NOTE — Progress Notes (Signed)

## 2024-04-03 NOTE — Group Note (Signed)
 Date:  04/03/2024 Time:  11:41 AM  Group Topic/Focus:  Making Healthy Choices:   The focus of this group is to help patients identify negative/unhealthy choices they were using prior to admission and identify positive/healthier coping strategies to replace them upon discharge.    Participation Level:  Active  Participation Quality:  Appropriate  Affect:  Appropriate  Cognitive:  Appropriate  Insight: Appropriate  Engagement in Group:  Engaged  Modes of Intervention:  Discussion   Mathew Aguilar 04/03/2024, 11:41 AM

## 2024-04-03 NOTE — BHH Counselor (Signed)
 CSW contacted pt's ACT Team Lead Audria) about follow-up for pt.   CSW left HIPAA compliant VM requesting return call.   Lum Croft, MSW, [place] 04/03/2024 1:12 PM

## 2024-04-03 NOTE — Progress Notes (Signed)
  [facility] Adult Case Management Discharge Plan :  Will you be returning to the same living situation after discharge:  Yes,  pt will return home  At discharge, do you have transportation home?: Yes,  pt's pastor will pick him up  Do you have the ability to pay for your medications: Yes,  [employer] MEDICARE / DREMA DUAL COMPLETE  Release of information consent forms completed and in the chart;  Patient's signature needed at discharge.  Patient to Follow up at:  Follow-up Information     [facility] Go on 04/03/2024.   Contact information: [address] [place] [address] [phone]                 Next level of care provider has access to [facility] Link:no  Safety Planning and Suicide Prevention discussed: Yes,  Romero, team lead [facility] , [phone]     Has patient been referred to the Quitline?: Patient refused referral for treatment  Patient has been referred for addiction treatment: No known substance use disorder.  [address], LCSWA 04/03/2024, 1:36 PM

## 2024-04-27 ENCOUNTER — Encounter (HOSPITAL_COMMUNITY): Payer: Self-pay

## 2024-04-27 ENCOUNTER — Other Ambulatory Visit: Payer: Self-pay

## 2024-04-27 ENCOUNTER — Emergency Department (HOSPITAL_COMMUNITY)
Admission: EM | Admit: 2024-04-27 | Discharge: 2024-04-27 | Disposition: A | Discharge status: Home / Self Care | Attending: Emergency Medicine | Admitting: Emergency Medicine

## 2024-04-27 ENCOUNTER — Emergency Department (HOSPITAL_COMMUNITY)

## 2024-04-27 DIAGNOSIS — F1090 Alcohol use, unspecified, uncomplicated: Secondary | ICD-10-CM | POA: Diagnosis not present

## 2024-04-27 DIAGNOSIS — J45909 Unspecified asthma, uncomplicated: Secondary | ICD-10-CM | POA: Diagnosis not present

## 2024-04-27 DIAGNOSIS — Z79899 Other long term (current) drug therapy: Secondary | ICD-10-CM | POA: Diagnosis not present

## 2024-04-27 DIAGNOSIS — S0081XA Abrasion of other part of head, initial encounter: Secondary | ICD-10-CM | POA: Diagnosis not present

## 2024-04-27 DIAGNOSIS — E039 Hypothyroidism, unspecified: Secondary | ICD-10-CM | POA: Insufficient documentation

## 2024-04-27 DIAGNOSIS — I10 Essential (primary) hypertension: Secondary | ICD-10-CM | POA: Diagnosis not present

## 2024-04-27 DIAGNOSIS — W010XXA Fall on same level from slipping, tripping and stumbling without subsequent striking against object, initial encounter: Secondary | ICD-10-CM | POA: Insufficient documentation

## 2024-04-27 DIAGNOSIS — W19XXXA Unspecified fall, initial encounter: Secondary | ICD-10-CM

## 2024-04-27 DIAGNOSIS — Z23 Encounter for immunization: Secondary | ICD-10-CM | POA: Diagnosis not present

## 2024-04-27 DIAGNOSIS — F32A Depression, unspecified: Secondary | ICD-10-CM | POA: Insufficient documentation

## 2024-04-27 DIAGNOSIS — Z79890 Hormone replacement therapy (postmenopausal): Secondary | ICD-10-CM | POA: Insufficient documentation

## 2024-04-27 DIAGNOSIS — Y903 Blood alcohol level of 60-79 mg/100 ml: Secondary | ICD-10-CM | POA: Diagnosis not present

## 2024-04-27 DIAGNOSIS — S0990XA Unspecified injury of head, initial encounter: Secondary | ICD-10-CM | POA: Diagnosis present

## 2024-04-27 LAB — CBC
HCT: 46 % (ref 39.0–52.0)
Hemoglobin: 15.9 g/dL (ref 13.0–17.0)
MCH: 31.2 pg (ref 26.0–34.0)
MCHC: 34.6 g/dL (ref 30.0–36.0)
MCV: 90.2 fL (ref 80.0–100.0)
Platelets: 195 K/uL (ref 150–400)
RBC: 5.1 MIL/uL (ref 4.22–5.81)
RDW: 12.6 % (ref 11.5–15.5)
WBC: 4.9 K/uL (ref 4.0–10.5)
nRBC: 0 % (ref 0.0–0.2)

## 2024-04-27 LAB — BASIC METABOLIC PANEL WITH GFR
Anion gap: 14 (ref 5–15)
BUN: 12 mg/dL (ref 8–23)
CO2: 16 mmol/L — ABNORMAL LOW (ref 22–32)
Calcium: 8.6 mg/dL — ABNORMAL LOW (ref 8.9–10.3)
Chloride: 104 mmol/L (ref 98–111)
Creatinine, Ser: 0.56 mg/dL — ABNORMAL LOW (ref 0.61–1.24)
GFR, Estimated: >60 mL/min (ref >60)
Glucose, Bld: 187 mg/dL — ABNORMAL HIGH (ref 70–99)
Potassium: 3.8 mmol/L (ref 3.5–5.1)
Sodium: 134 mmol/L — ABNORMAL LOW (ref 135–145)

## 2024-04-27 LAB — ETHANOL: Alcohol, Ethyl (B): 78 mg/dL — ABNORMAL HIGH (ref <15)

## 2024-04-27 MED ORDER — TETANUS-DIPHTH-ACELL PERTUSSIS 5-2.5-18.5 LF-MCG/0.5 IM SUSY
0.5000 mL | PREFILLED_SYRINGE | Freq: Once | INTRAMUSCULAR | Status: AC
Start: 2024-04-27 — End: 2024-04-27
  Administered 2024-04-27: 0.5 mL via INTRAMUSCULAR
  Filled 2024-04-27: qty 0.5

## 2024-04-27 NOTE — ED Triage Notes (Signed)
 PT BIB EMS for depression over his son leaving and unsure where his son is, at this time, he fell and hit the floor after drinking 3 40 oz beers, small laceration to his forehead.  118/80 HR 81 SR with PVC's Resp 18 97% RA 18 g Left arm

## 2024-04-27 NOTE — Discharge Instructions (Addendum)
 You sustained a fall and have been diagnosed with muscular injuries as result of this accident.    You will likely experience muscle spasms, muscle aches, and bruising as a result of these injuries.  Ultimately these injuries will take time to heal.  Rest, hydration, gentle exercise and stretching will aid in recovery from his injuries.    Using medication such as Tylenol  and ibuprofen  will help alleviate pain as well as decrease swelling and inflammation associated with these injuries. You may use up to 800 mg ibuprofen  every 6 hours or up to 1000 mg of Tylenol  every 6 hours.  You may choose to alternate between the 2.  This would be most effective.  Do not exceed 4000 mg of Tylenol  within 24 hours.  Do not exceed 3200 mg ibuprofen  within 24 hours.  If your fall was today you will likely feel far more achy and painful tomorrow morning.  This is to be expected.  Salt water/Epson salt soaks, massage, icy hot/Biofreeze/BenGay and other similar products can help with symptoms.  Additionally you do have a small wound on your forehead, please clean this with soap and water and keep it dressed over the next few days.  Monitor for any signs of infection as that would require returning for antibiotics.  Please call your PCP to schedule close follow-up appointment at your earliest convenience.  Please return to the emergency department for reevaluation if you denies any new or concerning symptoms.

## 2024-04-27 NOTE — ED Notes (Signed)
 Spoke with sister, able to get a ride for pt, pastor is picking up pt.
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
